# Patient Record
Sex: Female | Born: 1950 | Race: White | Hispanic: No | Smoking: Former smoker
Health system: Southern US, Community
[De-identification: ages and names within clinical notes are randomized; demographics above are authoritative.]

## PROBLEM LIST (undated history)

## (undated) DIAGNOSIS — R509 Fever, unspecified: Secondary | ICD-10-CM

## (undated) DIAGNOSIS — Z87442 Personal history of urinary calculi: Secondary | ICD-10-CM

## (undated) DIAGNOSIS — J45909 Unspecified asthma, uncomplicated: Secondary | ICD-10-CM

## (undated) DIAGNOSIS — N200 Calculus of kidney: Secondary | ICD-10-CM

## (undated) DIAGNOSIS — N189 Chronic kidney disease, unspecified: Secondary | ICD-10-CM

## (undated) DIAGNOSIS — N83299 Other ovarian cyst, unspecified side: Secondary | ICD-10-CM

## (undated) DIAGNOSIS — J189 Pneumonia, unspecified organism: Secondary | ICD-10-CM

## (undated) DIAGNOSIS — H269 Unspecified cataract: Secondary | ICD-10-CM

## (undated) DIAGNOSIS — H9319 Tinnitus, unspecified ear: Secondary | ICD-10-CM

## (undated) DIAGNOSIS — I10 Essential (primary) hypertension: Secondary | ICD-10-CM

## (undated) DIAGNOSIS — M778 Other enthesopathies, not elsewhere classified: Secondary | ICD-10-CM

## (undated) DIAGNOSIS — G4700 Insomnia, unspecified: Secondary | ICD-10-CM

## (undated) DIAGNOSIS — E669 Obesity, unspecified: Secondary | ICD-10-CM

## (undated) DIAGNOSIS — F320 Major depressive disorder, single episode, mild: Secondary | ICD-10-CM

## (undated) DIAGNOSIS — R569 Unspecified convulsions: Secondary | ICD-10-CM

## (undated) DIAGNOSIS — D649 Anemia, unspecified: Secondary | ICD-10-CM

## (undated) DIAGNOSIS — M1990 Unspecified osteoarthritis, unspecified site: Secondary | ICD-10-CM

## (undated) DIAGNOSIS — T7840XA Allergy, unspecified, initial encounter: Secondary | ICD-10-CM

## (undated) DIAGNOSIS — K219 Gastro-esophageal reflux disease without esophagitis: Secondary | ICD-10-CM

## (undated) DIAGNOSIS — E785 Hyperlipidemia, unspecified: Secondary | ICD-10-CM

## (undated) DIAGNOSIS — K8020 Calculus of gallbladder without cholecystitis without obstruction: Secondary | ICD-10-CM

## (undated) DIAGNOSIS — T4145XA Adverse effect of unspecified anesthetic, initial encounter: Secondary | ICD-10-CM

## (undated) DIAGNOSIS — J302 Other seasonal allergic rhinitis: Secondary | ICD-10-CM

## (undated) DIAGNOSIS — M2550 Pain in unspecified joint: Secondary | ICD-10-CM

## (undated) DIAGNOSIS — E119 Type 2 diabetes mellitus without complications: Secondary | ICD-10-CM

## (undated) HISTORY — PX: TONSILLECTOMY: SUR1361

## (undated) HISTORY — DX: Major depressive disorder, single episode, mild: F32.0

## (undated) HISTORY — PX: ELBOW SURGERY: SHX618

## (undated) HISTORY — DX: Obesity, unspecified: E66.9

## (undated) HISTORY — DX: Calculus of kidney: N20.0

## (undated) HISTORY — DX: Allergy, unspecified, initial encounter: T78.40XA

## (undated) HISTORY — DX: Other ovarian cyst, unspecified side: N83.299

## (undated) HISTORY — DX: Gastro-esophageal reflux disease without esophagitis: K21.9

## (undated) HISTORY — DX: Other enthesopathies, not elsewhere classified: M77.8

## (undated) HISTORY — PX: FRACTURE SURGERY: SHX138

## (undated) HISTORY — DX: Unspecified cataract: H26.9

## (undated) HISTORY — DX: Essential (primary) hypertension: I10

## (undated) HISTORY — DX: Type 2 diabetes mellitus without complications: E11.9

## (undated) HISTORY — DX: Tinnitus, unspecified ear: H93.19

## (undated) HISTORY — DX: Fever, unspecified: R50.9

## (undated) HISTORY — PX: TUBAL LIGATION: SHX77

## (undated) HISTORY — DX: Calculus of gallbladder without cholecystitis without obstruction: K80.20

---

## 1979-09-07 DIAGNOSIS — T8859XA Other complications of anesthesia, initial encounter: Secondary | ICD-10-CM

## 1979-09-07 HISTORY — DX: Other complications of anesthesia, initial encounter: T88.59XA

## 1992-09-06 HISTORY — PX: ABDOMINAL HYSTERECTOMY: SHX81

## 1992-09-06 HISTORY — PX: PARTIAL HYSTERECTOMY: SHX80

## 2002-12-27 DIAGNOSIS — Z860100 Personal history of colon polyps, unspecified: Secondary | ICD-10-CM | POA: Insufficient documentation

## 2005-12-23 ENCOUNTER — Ambulatory Visit: Payer: Self-pay | Admitting: Unknown Physician Specialty

## 2006-06-20 ENCOUNTER — Emergency Department: Payer: Self-pay | Admitting: Emergency Medicine

## 2006-12-30 ENCOUNTER — Ambulatory Visit: Payer: Self-pay

## 2007-02-28 ENCOUNTER — Ambulatory Visit: Payer: Self-pay | Admitting: Internal Medicine

## 2007-03-03 ENCOUNTER — Ambulatory Visit: Payer: Self-pay | Admitting: Internal Medicine

## 2007-03-07 ENCOUNTER — Ambulatory Visit: Payer: Self-pay | Admitting: Internal Medicine

## 2007-05-08 HISTORY — PX: COLONOSCOPY: SHX174

## 2007-05-12 ENCOUNTER — Ambulatory Visit: Payer: Self-pay | Admitting: Unknown Physician Specialty

## 2008-06-25 ENCOUNTER — Emergency Department: Payer: Self-pay | Admitting: Emergency Medicine

## 2009-09-06 DIAGNOSIS — N83299 Other ovarian cyst, unspecified side: Secondary | ICD-10-CM

## 2009-09-06 HISTORY — PX: UPPER GI ENDOSCOPY: SHX6162

## 2009-09-06 HISTORY — DX: Other ovarian cyst, unspecified side: N83.299

## 2010-03-05 ENCOUNTER — Emergency Department: Payer: Self-pay | Admitting: Emergency Medicine

## 2010-03-11 ENCOUNTER — Ambulatory Visit: Payer: Self-pay | Admitting: General Practice

## 2010-03-19 ENCOUNTER — Ambulatory Visit: Payer: Self-pay | Admitting: Urology

## 2010-03-30 ENCOUNTER — Ambulatory Visit: Payer: Self-pay | Admitting: Urology

## 2010-04-06 HISTORY — PX: OOPHORECTOMY: SHX86

## 2010-04-27 ENCOUNTER — Ambulatory Visit: Payer: Self-pay

## 2010-04-27 ENCOUNTER — Ambulatory Visit: Payer: Self-pay | Admitting: Urology

## 2010-04-30 ENCOUNTER — Ambulatory Visit: Payer: Self-pay

## 2010-05-04 LAB — PATHOLOGY REPORT

## 2010-10-27 ENCOUNTER — Ambulatory Visit: Payer: Self-pay | Admitting: Urology

## 2011-07-15 IMAGING — CR DG ABDOMEN 1V
1 series · 2 of 2 positions shown · non-contrast
Comparison: none

REASON FOR EXAM: neprolithiasis
COMMENTS:

PROCEDURE:     DXR - DXR KIDNEY URETER BLADDER  - October 27, 2010  [DATE]
RESULT:     Comparison: 03/30/2010

[Series 1: view not recorded · 0.17mm/px · 2 of 2 slices shown]
[im 1/2]
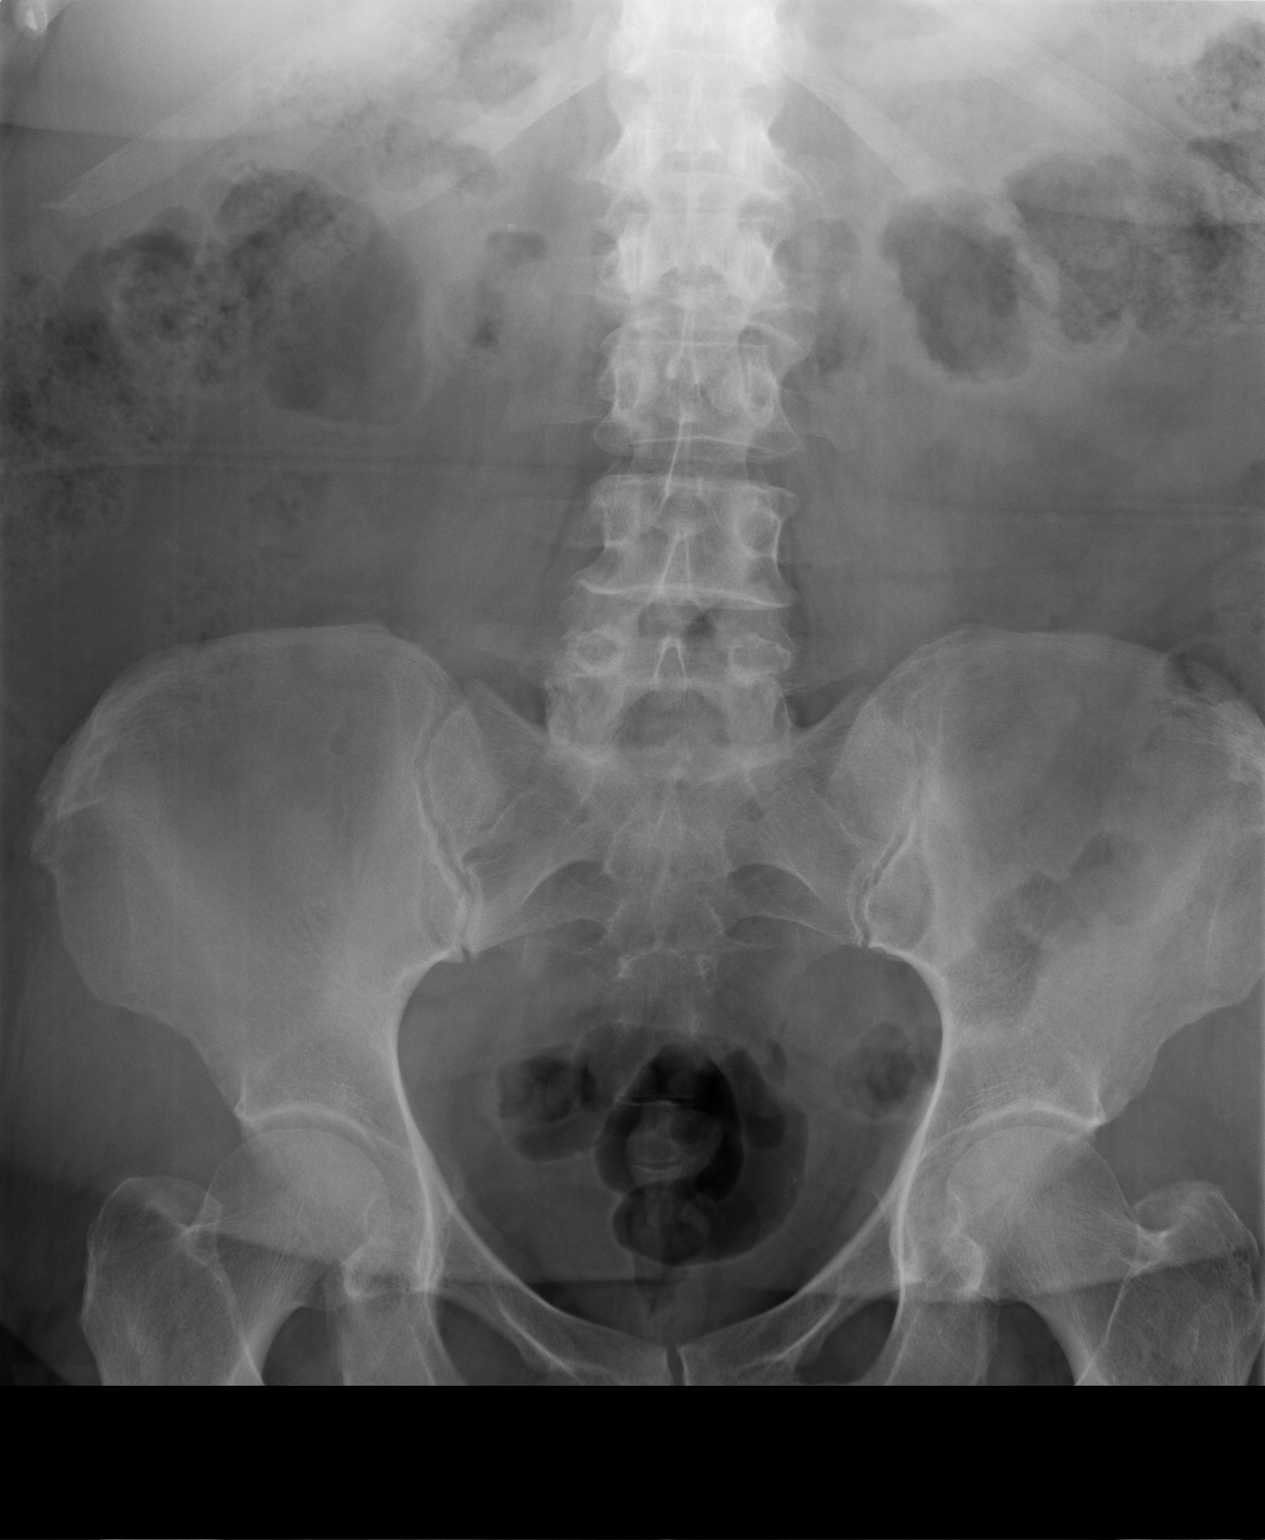
[im 2/2]
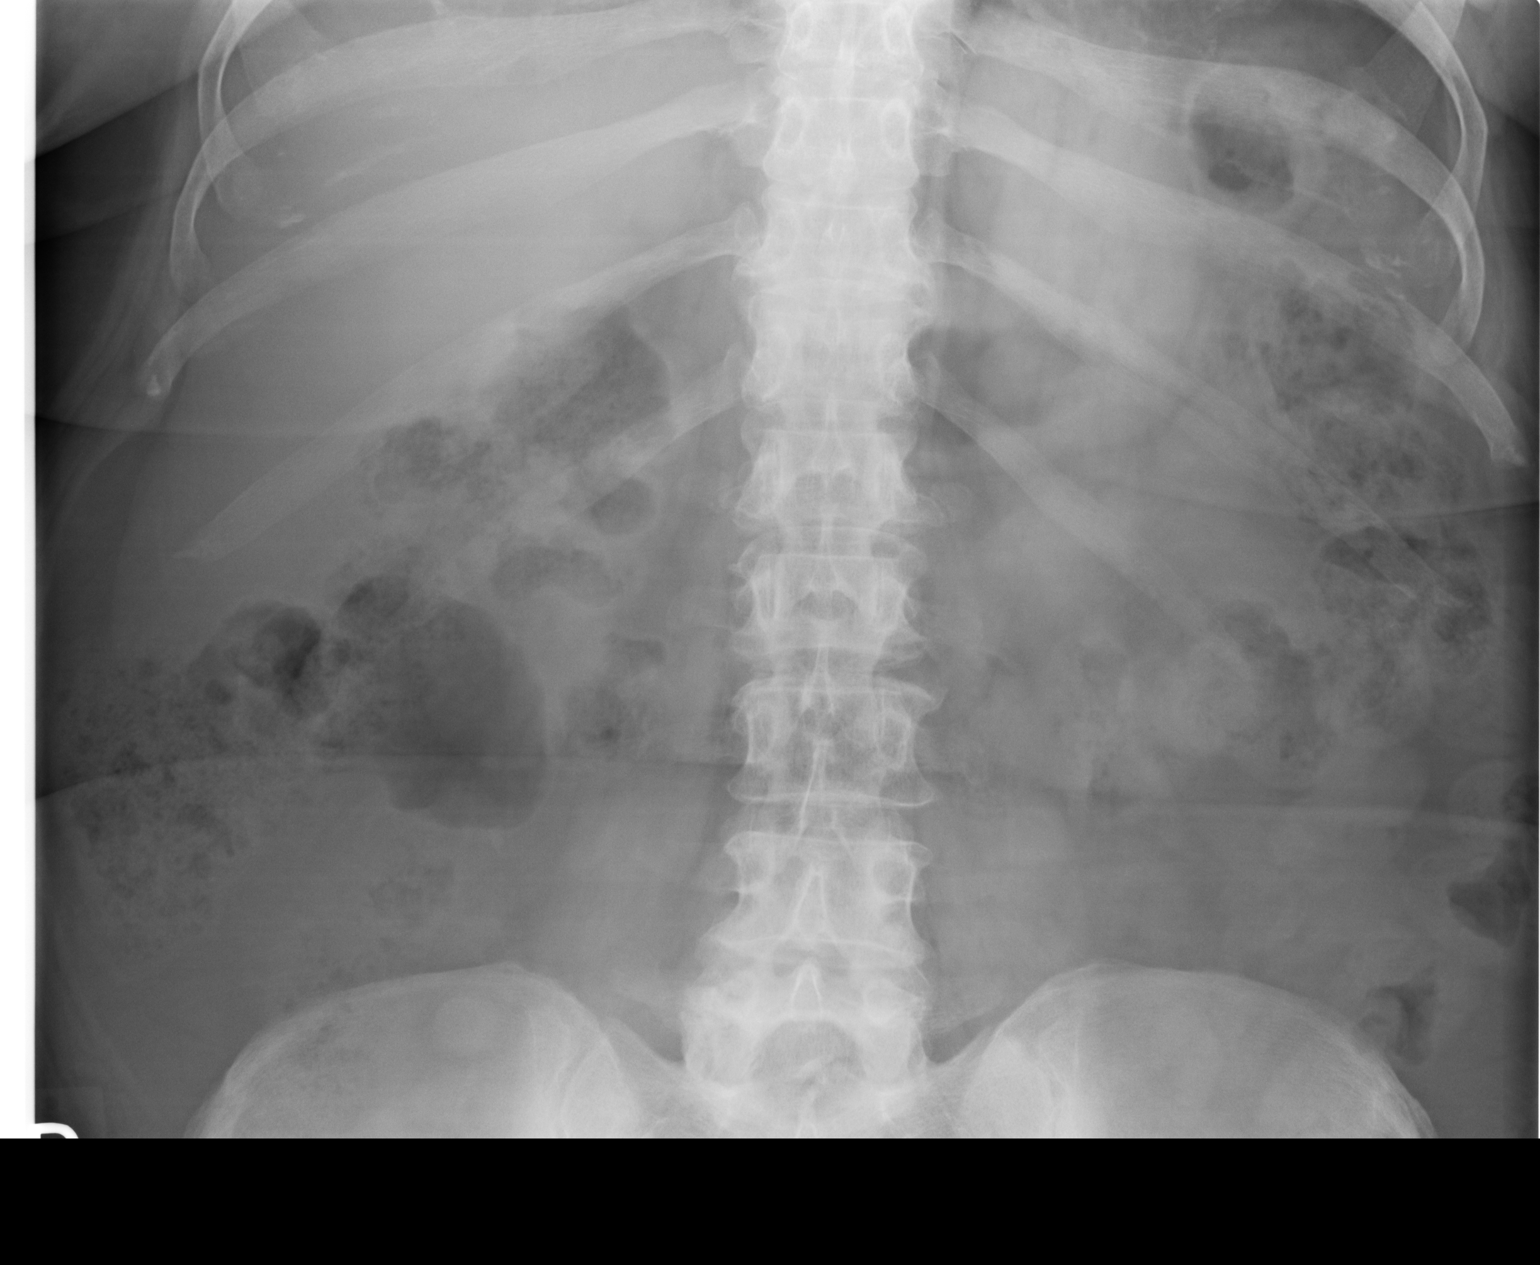

[2 of 2 positions shown; findings below may reference images not displayed]

FINDINGS: No definite radiopaque calculi seen overlying the renal shadows or expected
courses of the ureters. Small calcifications in the pelvis are similar to
prior and likely related to phleboliths. Air is seen within nondilated small
and large bowel.
IMPRESSION: No definite radiopaque calculi seen overlying the kidneys.

## 2011-09-14 ENCOUNTER — Ambulatory Visit (INDEPENDENT_AMBULATORY_CARE_PROVIDER_SITE_OTHER): Payer: BC Managed Care – PPO | Admitting: Internal Medicine

## 2011-09-14 ENCOUNTER — Encounter: Payer: Self-pay | Admitting: Internal Medicine

## 2011-09-14 VITALS — BP 144/76 | HR 96 | Temp 98.6°F | Wt 240.0 lb

## 2011-09-14 DIAGNOSIS — R7309 Other abnormal glucose: Secondary | ICD-10-CM

## 2011-09-14 DIAGNOSIS — R739 Hyperglycemia, unspecified: Secondary | ICD-10-CM

## 2011-09-14 DIAGNOSIS — E559 Vitamin D deficiency, unspecified: Secondary | ICD-10-CM

## 2011-09-14 DIAGNOSIS — R030 Elevated blood-pressure reading, without diagnosis of hypertension: Secondary | ICD-10-CM

## 2011-09-14 DIAGNOSIS — Z23 Encounter for immunization: Secondary | ICD-10-CM

## 2011-09-14 DIAGNOSIS — H6690 Otitis media, unspecified, unspecified ear: Secondary | ICD-10-CM

## 2011-09-14 MED ORDER — DOXYCYCLINE HYCLATE 100 MG PO TABS: 100.0000 mg | ORAL_TABLET | Freq: Two times a day (BID) | ORAL | Status: AC

## 2011-09-14 MED ORDER — CLINDAMYCIN HCL 300 MG PO CAPS: 300.0000 mg | ORAL_CAPSULE | Freq: Three times a day (TID) | ORAL | Status: AC

## 2011-09-14 MED ORDER — PREDNISONE (PAK) 10 MG PO TABS: ORAL_TABLET | ORAL | Status: AC

## 2011-09-14 NOTE — Patient Instructions (Signed)
We are using 2 antibiotics for your ears,  Take both for 7 days,    And take sudafed PE  For your decongestant, 10 mg every 6 hours but omit the evening dose and use afrin nasal spray at bedtime  Adding prednisone for inflammation ;  Its a 6 day taper dosed once daily

## 2011-09-14 NOTE — Progress Notes (Signed)
  Subjective:    Patient ID: Cheryl Wallace, female    DOB: [birth year], 61 y.o.   MRN: [id number]  HPI  Cheryl Wallace is a 61 yr old white female with a hsitory of obesity and depression who presents with chest congestion and sinus head cold symptoms since Christmas.  She has had a low grade fever for one week ( T 99) without myalgias, nausea or diarrhea.  Not taking anything OTC.  Works for [employer] but has not had her influenza  Vaccine yet.  Past Medical History  Diagnosis Date  . Obesity   . Depression, major   . Allergic rhinitis    No current outpatient prescriptions on file prior to visit.     Review of Systems  Constitutional: Negative for fever, chills and unexpected weight change.  HENT: Positive for congestion, rhinorrhea, postnasal drip and sinus pressure. Negative for hearing loss, ear pain, nosebleeds, sore throat, facial swelling, sneezing, mouth sores, trouble swallowing, neck pain, neck stiffness, voice change, tinnitus and ear discharge.   Eyes: Negative for pain, discharge, redness and visual disturbance.  Respiratory: Positive for cough. Negative for chest tightness, shortness of breath, wheezing and stridor.   Cardiovascular: Negative for chest pain, palpitations and leg swelling.  Musculoskeletal: Negative for myalgias and arthralgias.  Skin: Negative for color change and rash.  Neurological: Negative for dizziness, weakness, light-headedness and headaches.  Hematological: Negative for adenopathy.   BP 144/76  Pulse 96  Temp(Src) 98.6 F (37 C) (Oral)  Wt 240 lb (108.863 kg)  SpO2 97%      Objective:   Physical Exam  Constitutional: She appears well-developed and well-nourished.  HENT:  Right Ear: Tympanic membrane and ear canal normal.  Left Ear: Ear canal normal. Tympanic membrane is injected and bulging.  Mouth/Throat: Mucous membranes are normal. Posterior oropharyngeal erythema present. No posterior oropharyngeal edema.  Eyes: EOM are  normal. Pupils are equal, round, and reactive to light. Right conjunctiva is injected. Left conjunctiva is injected.  Neck: Normal range of motion. Neck supple.  Pulmonary/Chest: Effort normal and breath sounds normal.  Abdominal: Soft.  Lymphadenopathy:    She has cervical adenopathy.          Assessment & Plan:  Otitis media left ear due to persistent sinus congestion.  Augmentin. Sudafed PE and prednisone x 1 week.  Elevated blood-pressure reading without diagnosis of hypertension May be due to recent URI .  Will have patient recheck once her acute illness is resolved. t    Updated Medication List Outpatient Encounter Prescriptions as of 09/14/2011  Medication Sig Dispense Refill  . omeprazole (PRILOSEC OTC) 20 MG tablet Take 20 mg by mouth daily.        . clindamycin (CLEOCIN) 300 MG capsule Take 1 capsule (300 mg total) by mouth 3 (three) times daily.  21 capsule  0  . doxycycline (VIBRA-TABS) 100 MG tablet Take 1 tablet (100 mg total) by mouth 2 (two) times daily.  14 tablet  0  . predniSONE (STERAPRED UNI-PAK) 10 MG tablet 6 tablets on day 1, decrease by tablet daily until gone  21 tablet  0

## 2011-09-15 ENCOUNTER — Encounter: Payer: Self-pay | Admitting: Internal Medicine

## 2011-09-15 DIAGNOSIS — I152 Hypertension secondary to endocrine disorders: Secondary | ICD-10-CM | POA: Insufficient documentation

## 2011-09-15 DIAGNOSIS — E1159 Type 2 diabetes mellitus with other circulatory complications: Secondary | ICD-10-CM | POA: Insufficient documentation

## 2011-09-15 DIAGNOSIS — Z23 Encounter for immunization: Secondary | ICD-10-CM

## 2011-09-15 LAB — COMPREHENSIVE METABOLIC PANEL
AST: 23 U/L (ref 0–37)
Albumin: 3.8 g/dL (ref 3.5–5.2)
Alkaline Phosphatase: 104 U/L (ref 39–117)
Calcium: 8.7 mg/dL (ref 8.4–10.5)
Chloride: 109 mEq/L (ref 96–112)
Glucose, Bld: 109 mg/dL — ABNORMAL HIGH (ref 70–99)
Potassium: 3.7 mEq/L (ref 3.5–5.1)
Sodium: 142 mEq/L (ref 135–145)
Total Protein: 6.7 g/dL (ref 6.0–8.3)

## 2011-09-15 NOTE — Assessment & Plan Note (Signed)
May be due to recent URI .  Will have patient recheck once her acute illness is resolved. t

## 2011-12-21 ENCOUNTER — Encounter: Payer: Self-pay | Admitting: Internal Medicine

## 2011-12-21 ENCOUNTER — Ambulatory Visit (INDEPENDENT_AMBULATORY_CARE_PROVIDER_SITE_OTHER): Payer: BC Managed Care – PPO | Admitting: Internal Medicine

## 2011-12-21 VITALS — BP 150/84 | HR 92 | Temp 98.2°F | Resp 16 | Wt 229.5 lb

## 2011-12-21 DIAGNOSIS — J309 Allergic rhinitis, unspecified: Secondary | ICD-10-CM

## 2011-12-21 DIAGNOSIS — J3089 Other allergic rhinitis: Secondary | ICD-10-CM | POA: Insufficient documentation

## 2011-12-21 DIAGNOSIS — R030 Elevated blood-pressure reading, without diagnosis of hypertension: Secondary | ICD-10-CM

## 2011-12-21 DIAGNOSIS — E119 Type 2 diabetes mellitus without complications: Secondary | ICD-10-CM

## 2011-12-21 MED ORDER — CETIRIZINE HCL 10 MG PO TABS: 10.0000 mg | ORAL_TABLET | Freq: Every day | ORAL | Status: DC

## 2011-12-21 NOTE — Patient Instructions (Addendum)
You appear to be suffering from allergies.,  Take zyrtec daily  In the evening,  Add afrin at bedtime or sudafed PE during the day for the congestion   Consider the Low Glycemic Index Diet and 6 smaller meals daily :   7 AM Low carbohydrate Protein  Shakes (EAS Carb Control  Or Atkins ,  Available everywhere,   In  cases at BJs )  2.5 carbs  (Add or substitute a toasted sandwhich thin w/ peanut butter)  10 AM: Protein bar by Atkins (snack size,  Chocolate lover's variety at  [employer]J's)    Lunch: sandwich on pita bread or flatbread (Joseph's makes a pita bread and a flat bread , available at [employer] and [employer]J's; Toufayah makes a low carb flatbread available at [employer] and [employer]) Mission makes a low carb whole wheat tortilla  3 PM:  Mid day :  Another protein bar,  Or a  cheese stick, 1/4 cup of almonds, walnuts, pistachios, pecans, peanuts,  Macadamia nuts  6 PM  Dinner:  "mean and green:"  Meat/chicken/fish, salad, and green veggie : use ranch, vinagrette,  Blue cheese, etc  9 PM snack : Breyer's low carb fudgiscle or  ice cream bar (Carb Smart) Weight Watcher's ice cream bar , or another protein shake

## 2011-12-21 NOTE — Progress Notes (Signed)
Patient ID: Cheryl Wallace, female   DOB: Jul 01, 1951, 61 y.o.   MRN: [id number]  Patient Active Problem List  Diagnoses  . Elevated blood-pressure reading without diagnosis of hypertension  . Rhinitis, allergic  . Diabetes mellitus, new onset    Subjective:  CC:   Chief Complaint  Patient presents with  . Follow-up    HPI:   Cheryl Pankowski Huffmanis a 61 y.o. female who presents One week history of sinus pressure over frontal and maxillary, bilaterally, accompanied by rhinitis, sneezing and conjunctivitis, with clear discharge,  No nasal discharge.  2nd issue is follow up on abnormal labs drawn in early January.  Her hgba1c was 6.9, reflecting a new diagnosis of diabetes.  Since receiving this news she has been following a low glycemic index diet and has lost 11 lbs by limiting her starches. She was not instructed to check her blood sugars as of yet.       Past Medical History  Diagnosis Date  . Obesity   . Depression, major   . Allergic rhinitis     Past Surgical History  Procedure Date  . Partial hysterectomy 1994    still has ovaries, removed for fibroids  . Oophorectomy 04/2010    bilateral, 7 cm benign tumor left ovary         The following portions of the patient's history were reviewed and updated as appropriate: Allergies, current medications, and problem list.    Review of Systems:   12 Pt  review of systems was negative except those addressed in the HPI,     History   Social History  . Marital Status: Divorced    Spouse Name: N/A    Number of Children: N/A  . Years of Education: N/A   Occupational History  . Not on file.   Social History Main Topics  . Smoking status: Former Games developer  . Smokeless tobacco: Never Used   Comment: remote, quit 20 years ago  . Alcohol Use: 3.0 oz/week    5 Glasses of wine per week     on occasion, martinis maybe once a month  . Drug Use: Not on file  . Sexually Active: Not on file   Other Topics Concern  . Not on  file   Social History Narrative   Lives alone, exercises occasionally.  Always uses seat belts.  Has well water.    Objective:  BP 150/84  Pulse 92  Temp(Src) 98.2 F (36.8 C) (Oral)  Resp 16  Wt 229 lb 8 oz (104.101 kg)  SpO2 95%  General appearance: alert, cooperative and appears stated age Ears: normal TM's and external ear canals both ears Throat: lips, mucosa, and tongue normal; teeth and gums normal Neck: no adenopathy, no carotid bruit, supple, symmetrical, trachea midline and thyroid not enlarged, symmetric, no tenderness/mass/nodules Back: symmetric, no curvature. ROM normal. No CVA tenderness. Lungs: clear to auscultation bilaterally Heart: regular rate and rhythm, S1, S2 normal, no murmur, click, rub or gallop Abdomen: soft, non-tender; bowel sounds normal; no masses,  no organomegaly Pulses: 2+ and symmetric Skin: Skin color, texture, turgor normal. No rashes or lesions Lymph nodes: Cervical, supraclavicular, and axillary nodes normal.  Assessment and Plan:  Elevated blood-pressure reading without diagnosis of hypertension This is her second elevated reading.  She is suffering from allergic rhinitis currently and has taken a decongestant.  She will return for bp check in one month. Will start losartan or lisinopril if 3rd reading confirms hypertension .  Diabetes mellitus,  new onset Diagnosed by hgba1c of 6.9.  She has already lost 11 lbs on a low glycemic index diet and repeat A1c is 6.8 .  Handouts and educational discussion given today.  Annul eye exam recommended.  foot exam was normal. Return for HgbA1c in 3 months   Rhinitis, allergic Supportive care recommended.  Advised to avoid decongestants (oral)    Updated Medication List Outpatient Encounter Prescriptions as of 12/21/2011  Medication Sig Dispense Refill  . amitriptyline (ELAVIL) 25 MG tablet Take 25 mg by mouth at bedtime.      [name] omeprazole (PRILOSEC OTC) 20 MG tablet Take 20 mg by mouth daily.         . cetirizine (ZYRTEC) 10 MG tablet Take 1 tablet (10 mg total) by mouth daily.  30 tablet  11     Orders Placed This Encounter  Procedures  . Hemoglobin A1c  . Basic metabolic panel  . Direct LDL    Return in about 3 months (around 03/21/2012).

## 2011-12-22 LAB — LDL CHOLESTEROL, DIRECT: Direct LDL: 83.2 mg/dL

## 2011-12-22 LAB — BASIC METABOLIC PANEL
BUN: 14 mg/dL (ref 6–23)
Chloride: 106 mEq/L (ref 96–112)
GFR: 49.8 mL/min — ABNORMAL LOW (ref >60.00)
Potassium: 3.6 mEq/L (ref 3.5–5.1)
Sodium: 140 mEq/L (ref 135–145)

## 2011-12-23 ENCOUNTER — Encounter: Payer: Self-pay | Admitting: Internal Medicine

## 2011-12-23 DIAGNOSIS — R7301 Impaired fasting glucose: Secondary | ICD-10-CM | POA: Insufficient documentation

## 2011-12-23 NOTE — Assessment & Plan Note (Signed)
Supportive care recommended.  Advised to avoid decongestants (oral)

## 2011-12-23 NOTE — Assessment & Plan Note (Addendum)
Diagnosed by hgba1c of 6.9.  She has already lost 11 lbs on a low glycemic index diet and repeat A1c is 6.8 .  Handouts and educational discussion given today.  Annul eye exam recommended.  foot exam was normal. Return for HgbA1c in 3 months

## 2011-12-23 NOTE — Assessment & Plan Note (Signed)
This is her second elevated reading.  She is suffering from allergic rhinitis currently and has taken a decongestant.  She will return for bp check in one month. Will start losartan or lisinopril if 3rd reading confirms hypertension .

## 2011-12-24 ENCOUNTER — Other Ambulatory Visit: Payer: Self-pay | Admitting: Internal Medicine

## 2011-12-24 ENCOUNTER — Telehealth: Payer: Self-pay | Admitting: *Deleted

## 2011-12-24 MED ORDER — OMEPRAZOLE MAGNESIUM 20 MG PO TBEC: 20.0000 mg | DELAYED_RELEASE_TABLET | Freq: Two times a day (BID) | ORAL | Status: DC

## 2011-12-24 NOTE — Telephone Encounter (Signed)
Patient was notified of lab results.

## 2012-03-29 ENCOUNTER — Other Ambulatory Visit: Payer: BC Managed Care – PPO

## 2012-04-03 ENCOUNTER — Ambulatory Visit: Payer: BC Managed Care – PPO | Admitting: Internal Medicine

## 2012-04-26 ENCOUNTER — Other Ambulatory Visit (INDEPENDENT_AMBULATORY_CARE_PROVIDER_SITE_OTHER): Payer: BC Managed Care – PPO | Admitting: *Deleted

## 2012-04-26 DIAGNOSIS — E119 Type 2 diabetes mellitus without complications: Secondary | ICD-10-CM

## 2012-04-26 LAB — LIPID PANEL
Cholesterol: 149 mg/dL (ref 0–200)
HDL: 40.3 mg/dL (ref >39.00)
Triglycerides: 136 mg/dL (ref 0.0–149.0)
VLDL: 27.2 mg/dL (ref 0.0–40.0)

## 2012-04-26 LAB — MICROALBUMIN / CREATININE URINE RATIO: Creatinine,U: 82.3 mg/dL

## 2012-04-27 LAB — COMPLETE METABOLIC PANEL WITH GFR
CO2: 27 mEq/L (ref 19–32)
Creat: 0.83 mg/dL (ref 0.50–1.10)
GFR, Est African American: 89 mL/min
GFR, Est Non African American: 77 mL/min
Glucose, Bld: 124 mg/dL — ABNORMAL HIGH (ref 70–99)
Sodium: 138 mEq/L (ref 135–145)
Total Bilirubin: 0.6 mg/dL (ref 0.3–1.2)
Total Protein: 6.1 g/dL (ref 6.0–8.3)

## 2012-05-03 ENCOUNTER — Ambulatory Visit: Payer: BC Managed Care – PPO | Admitting: Internal Medicine

## 2012-05-04 ENCOUNTER — Encounter: Payer: Self-pay | Admitting: Internal Medicine

## 2012-05-17 ENCOUNTER — Encounter: Payer: Self-pay | Admitting: Internal Medicine

## 2012-05-17 ENCOUNTER — Ambulatory Visit (INDEPENDENT_AMBULATORY_CARE_PROVIDER_SITE_OTHER): Payer: BC Managed Care – PPO | Admitting: Internal Medicine

## 2012-05-17 VITALS — BP 132/74 | HR 80 | Temp 98.5°F | Resp 16 | Wt 241.2 lb

## 2012-05-17 DIAGNOSIS — E119 Type 2 diabetes mellitus without complications: Secondary | ICD-10-CM

## 2012-05-17 DIAGNOSIS — J069 Acute upper respiratory infection, unspecified: Secondary | ICD-10-CM

## 2012-05-17 DIAGNOSIS — R030 Elevated blood-pressure reading, without diagnosis of hypertension: Secondary | ICD-10-CM

## 2012-05-17 NOTE — Patient Instructions (Addendum)
You have diet controlled diabetes  (for now)  .  You need to get back on the low glycemic index diet to prevent the need for medications.   Your sinus and ear symptoms may be viral,  Or allergic.  Use sudafed PE 10 mg every 6 hours,   Allegra or zyrtec once daily for the sneezing watery eyes,  Gargle with salt water for scratchy throat.  Simply Saline sinus lavage twice daily to flush sinuses.  If no improvement in sinus drainage and ear pain in 4 days,  Call for an antibiotic.    This is  Dr. Melina Schools version of a  "Low GI"  Diet:  All of the foods can be found at grocery stores and in bulk at [employer].  The Atkins protein bars and shakes are available in more varieties at Target, [employer] and [employer].     7 AM Breakfast:  Low carbohydrate Protein  Shakes (I recommend the EAS AdvantEdge "Carb Control" shakes  Or the low carb shakes by Atkins.   Both are available everywhere:  In  cases at BJs  Or in 4 packs at grocery stores and pharmacies  2.5 carbs  (Alternative is  a toasted Arnold's Sandwhich Thin w/ peanut butter, a "Bagel Thin" with cream cheese and salmon) or  a scrambled egg burrito made with a low carb tortilla .  Avoid cereal and bananas, oatmeal too unless you are cooking the old fashioned kind that takes 30-40 minutes to prepare.  the rest is overly processed, has minimal fiber, and is loaded with carbohydrates!   10 AM: Protein bar by Atkins (the snack size, under 200 cal).  There are many varieties , available widely again or in bulk in limited varieties at BJs)  Other so called "protein bars" tend to be loaded with carbohydrates.  Remember, in food advertising, the word "energy" is synonymous for " carbohydrate."  Lunch: sandwich of [place], (or any lunchmeat, grilled meat or canned tuna), fresh avocado, mayonnaise  and cheese on a lower carbohydrate pita bread, flatbread, or tortilla . Ok to use regular mayonnaise. The bread is the only source or carbohydrate that can be decreased  (Joseph's makes a pita bread and a flat bread that are 50 cal and 4 net carbs ; Toufayan makes a low carb flatbread that's 100 cal and 9 net carbs  and  Mission makes a low carb whole wheat tortilla  That is 210 cal and 6 net carbs)  3 PM:  Mid day :  Another protein bar,  Or a  cheese stick (100 cal, 0 carbs),  Or 1 ounce of  almonds, walnuts, pistachios, pecans, peanuts,  Macadamia nuts. Or a Dannon light n Fit greek yogurt, 80 cal 8 net carbs . Avoid "granola"; the dried cranberries and raisins are loaded with carbohydrates. Mixed nuts ok if no raisins or cranberries or dried fruit.      6 PM  Dinner:  "mean and green:"  Meat/chicken/fish or a high protein legume; , with a green salad, and a low GI  Veggie (broccoli, cauliflower, green beans, spinach, brussel sprouts. Lima beans) : Avoid "Low fat dressings, as well as Reyne Dumas and [address]! They are loaded with sugar! Instead use ranch, vinagrette,  Blue cheese, etc  9 PM snack : Breyer's "low carb" fudgsicle or  ice cream bar (Carb Smart line), or  Weight Watcher's ice cream bar , or another "no sugar added" ice cream;a serving of fresh berries/cherries with whipped  cream (Avoid bananas, pineapple, grapes  and watermelon on a regular basis because they are high in sugar)   Remember that snack Substitutions should be less than 15 to 20 carbs  Per serving. Remember to subtract fiber grams and sugar alcohols to get the "net carbs."

## 2012-05-17 NOTE — Progress Notes (Signed)
Patient ID: Cheryl Wallace, female   DOB: 07-06-1951, 61 y.o.   MRN: [id number]  Patient Active Problem List  Diagnosis  . Elevated blood-pressure reading without diagnosis of hypertension  . Rhinitis, allergic  . Diabetes mellitus, new onset  . URI, acute    Subjective:  CC:   Chief Complaint  Patient presents with  . Follow-up    3 month    HPI:   Cheryl Gores Huffmanis a 61 y.o. female who presents for Follow up on diet controlled diabetes, recently diagnosed with hemoglobin A1c.  She has not been  following a  low gi diet  and has  Gained all of the  weight she will previously lost. She has been eating more due to stress of recent job relocation. She reports a  Headache for one week that started with right ear pain. She has not been blowing out purulent drainage from his sinuses. She denies fevers but has been having a scratchy throat   Past Medical History  Diagnosis Date  . Obesity   . Depression, major   . Allergic rhinitis     Past Surgical History  Procedure Date  . Partial hysterectomy 1994    still has ovaries, removed for fibroids  . Oophorectomy 04/2010    bilateral, 7 cm benign tumor left ovary         The following portions of the patient's history were reviewed and updated as appropriate: Allergies, current medications, and problem list.    Review of Systems:   12 Pt  review of systems was negative except those addressed in the HPI,     History   Social History  . Marital Status: Divorced    Spouse Name: N/A    Number of Children: N/A  . Years of Education: N/A   Occupational History  . Not on file.   Social History Main Topics  . Smoking status: Former Games developer  . Smokeless tobacco: Never Used   Comment: remote, quit 20 years ago  . Alcohol Use: 3.0 oz/week    5 Glasses of wine per week     on occasion, martinis maybe once a month  . Drug Use: Not on file  . Sexually Active: Not on file   Other Topics Concern  . Not on file   Social  History Narrative   Lives alone, exercises occasionally.  Always uses seat belts.  Has well water.    Objective:  BP 132/74  Pulse 80  Temp 98.5 F (36.9 C) (Oral)  Resp 16  Wt 241 lb 4 oz (109.43 kg)  SpO2 97%  General appearance: alert, cooperative and appears stated age Ears: normal TM's and external ear canals both ears Throat: lips, mucosa, and tongue normal; teeth and gums normal Neck: no adenopathy, no carotid bruit, supple, symmetrical, trachea midline and thyroid not enlarged, symmetric, no tenderness/mass/nodules Back: symmetric, no curvature. ROM normal. No CVA tenderness. Lungs: clear to auscultation bilaterally Heart: regular rate and rhythm, S1, S2 normal, no murmur, click, rub or gallop Abdomen: soft, non-tender; bowel sounds normal; no masses,  no organomegaly Pulses: 2+ and symmetric Skin: Skin color, texture, turgor normal. No rashes or lesions Lymph nodes: Cervical, supraclavicular, and axillary nodes normal. Foot exam:  Nails are well trimmed,  No callouses,  Sensation intact to microfilament  Assessment and Plan:  URI, acute Multiple sites affected. Nothing to suggest suspect bacterial infection requiring antibiotics. Supportive care recommended.  Diabetes mellitus, new onset Reemphasized the importance of a low glycemic  index diet and regular followup for this new diagnosis for her with hemoglobin A1c of 6.8 made in August.  Low glycemic index diet given to her is present. She is not due for labs. Reminder for annual diabetic eye exam given. Foot exam today was normal.  Elevated blood-pressure reading without diagnosis of hypertension Blood pressure today was normal. Prior elevations were probably due to over-the-counter medications used.   Updated Medication List Outpatient Encounter Prescriptions as of 05/17/2012  Medication Sig Dispense Refill  . cholecalciferol (VITAMIN D) 1000 UNITS tablet Take 1,000 Units by mouth daily.      [name] omeprazole  (PRILOSEC OTC) 20 MG tablet Take 1 tablet (20 mg total) by mouth 2 (two) times daily.  60 tablet  6  . DISCONTD: amitriptyline (ELAVIL) 25 MG tablet Take 25 mg by mouth at bedtime.      [name] DISCONTD: cetirizine (ZYRTEC) 10 MG tablet Take 1 tablet (10 mg total) by mouth daily.  30 tablet  11     Orders Placed This Encounter  Procedures  . HM DIABETES EYE EXAM    Return in about 3 months (around 08/16/2012).

## 2012-05-19 ENCOUNTER — Encounter: Payer: Self-pay | Admitting: Internal Medicine

## 2012-05-19 DIAGNOSIS — J069 Acute upper respiratory infection, unspecified: Secondary | ICD-10-CM | POA: Insufficient documentation

## 2012-05-19 NOTE — Assessment & Plan Note (Signed)
Multiple sites affected. Nothing to suggest suspect bacterial infection requiring antibiotics. Supportive care recommended.

## 2012-05-19 NOTE — Assessment & Plan Note (Signed)
Blood pressure today was normal. Prior elevations were probably due to over-the-counter medications used.

## 2012-05-19 NOTE — Assessment & Plan Note (Addendum)
Reemphasized the importance of a low glycemic index diet and regular followup for this new diagnosis for her with hemoglobin A1c of 6.8 made in August.  Low glycemic index diet given to her is present. She is not due for labs. Reminder for annual diabetic eye exam given. Foot exam today was normal.

## 2012-06-21 ENCOUNTER — Other Ambulatory Visit: Payer: Self-pay

## 2012-06-21 MED ORDER — OMEPRAZOLE MAGNESIUM 20 MG PO TBEC: 20.0000 mg | DELAYED_RELEASE_TABLET | Freq: Two times a day (BID) | ORAL | Status: DC

## 2012-06-21 NOTE — Telephone Encounter (Signed)
Refill request for Omeprazole 20 mg # 60 6 R. Sent electronic to [facility].

## 2012-08-22 ENCOUNTER — Ambulatory Visit: Payer: BC Managed Care – PPO | Admitting: Internal Medicine

## 2012-09-25 ENCOUNTER — Ambulatory Visit (INDEPENDENT_AMBULATORY_CARE_PROVIDER_SITE_OTHER): Payer: BC Managed Care – PPO | Admitting: Internal Medicine

## 2012-09-25 ENCOUNTER — Encounter: Payer: Self-pay | Admitting: Internal Medicine

## 2012-09-25 VITALS — BP 140/82 | HR 81 | Temp 98.2°F | Resp 16 | Wt 238.5 lb

## 2012-09-25 DIAGNOSIS — R350 Frequency of micturition: Secondary | ICD-10-CM

## 2012-09-25 DIAGNOSIS — R52 Pain, unspecified: Secondary | ICD-10-CM

## 2012-09-25 DIAGNOSIS — R109 Unspecified abdominal pain: Secondary | ICD-10-CM

## 2012-09-25 LAB — POCT URINALYSIS DIPSTICK
Ketones, UA: NEGATIVE
Protein, UA: NEGATIVE
Spec Grav, UA: 1.02

## 2012-09-25 MED ORDER — ONDANSETRON HCL 4 MG PO TABS: 4.0000 mg | ORAL_TABLET | Freq: Three times a day (TID) | ORAL | Status: DC | PRN

## 2012-09-25 MED ORDER — HYDROCODONE-ACETAMINOPHEN 5-300 MG PO TABS: 1.0000 | ORAL_TABLET | ORAL | Status: DC | PRN

## 2012-09-25 NOTE — Patient Instructions (Addendum)
Use the strainer to catch any stones   use the zofran and vicodin for symptom control  If the pain is persistent call to schedule a CT   Lots of fluids

## 2012-09-25 NOTE — Assessment & Plan Note (Signed)
With nausea vomiting  Now currently resolved with a history of renal calculi, will treat emprically

## 2012-09-25 NOTE — Assessment & Plan Note (Signed)
Suspect early stone formation vs diverticulitis. vicodin, zofran, strain urine.

## 2012-09-25 NOTE — Progress Notes (Signed)
Patient ID: Cheryl Wallace, female   DOB: 03-22-1951, 62 y.o.   MRN: [id number]  Patient Active Problem List  Diagnosis  . Elevated blood-pressure reading without diagnosis of hypertension  . Rhinitis, allergic  . Diabetes mellitus, new onset  . URI, acute  . Acute left flank pain    Subjective:  CC:   Chief Complaint  Patient presents with  . Follow-up    HPI:   Cheryl Vallas Huffmanis a 62 y.o. female who presents Left CVA and llq pain , ua negative.  Started last night with back pain and nausea/vomiting. May have had on episode of gross hematuria. Had a normal BM last night.  No boday aches and fevers.    Had a flu shot  . Has had a kidney stone on same side 3 years ago.  Danielle Dess comes ang goes,  Currently not hurting none since 5 am    Past Medical History  Diagnosis Date  . Obesity   . Depression, major   . Allergic rhinitis     Past Surgical History  Procedure Date  . Partial hysterectomy 1994    still has ovaries, removed for fibroids  . Oophorectomy 04/2010    bilateral, 7 cm benign tumor left ovary         The following portions of the patient's history were reviewed and updated as appropriate: Allergies, current medications, and problem list.    Review of Systems:   12 Pt  review of systems was negative except those addressed in the HPI,     History   Social History  . Marital Status: Divorced    Spouse Name: N/A    Number of Children: N/A  . Years of Education: N/A   Occupational History  . Not on file.   Social History Main Topics  . Smoking status: Former Games developer  . Smokeless tobacco: Never Used     Comment: remote, quit 20 years ago  . Alcohol Use: 3.0 oz/week    5 Glasses of wine per week     Comment: on occasion, martinis maybe once a month  . Drug Use: Not on file  . Sexually Active: Not on file   Other Topics Concern  . Not on file   Social History Narrative   Lives alone, exercises occasionally.  Always uses seat belts.  Has well  water.    Objective:  BP 140/82  Pulse 81  Temp 98.2 F (36.8 C) (Oral)  Resp 16  Wt 238 lb 8 oz (108.183 kg)  SpO2 97%  General appearance: alert, cooperative and appears stated age Ears: normal TM's and external ear canals both ears Throat: lips, mucosa, and tongue normal; teeth and gums normal Neck: no adenopathy, no carotid bruit, supple, symmetrical, trachea midline and thyroid not enlarged, symmetric, no tenderness/mass/nodules Back: symmetric, no curvature. ROM normal. No CVA tenderness. Lungs: clear to auscultation bilaterally Heart: regular rate and rhythm, S1, S2 normal, no murmur, click, rub or gallop Abdomen: soft, non-tender; bowel sounds normal; no masses,  no organomegaly Pulses: 2+ and symmetric Skin: Skin color, texture, turgor normal. No rashes or lesions Lymph nodes: Cervical, supraclavicular, and axillary nodes normal.  Assessment and Plan:  Acute left flank pain With nausea vomiting  Now currently resolved with a history of renal calculi, will treat emprically    Suspect early stone formation vs diverticulitis. vicodin, zofran, strain urine.    Updated Medication List Outpatient Encounter Prescriptions as of 09/25/2012  Medication Sig Dispense Refill  . cholecalciferol (  VITAMIN D) 1000 UNITS tablet Take 1,000 Units by mouth daily.      [name] omeprazole (PRILOSEC OTC) 20 MG tablet Take 1 tablet (20 mg total) by mouth 2 (two) times daily.  60 tablet  6  . Hydrocodone-Acetaminophen 5-300 MG TABS Take 1 tablet by mouth every 4 (four) hours as needed.  60 each  0  . ondansetron (ZOFRAN) 4 MG tablet Take 1 tablet (4 mg total) by mouth every 8 (eight) hours as needed for nausea.  20 tablet  0     Orders Placed This Encounter  Procedures  . POCT urinalysis dipstick    Return in about 3 months (around 12/24/2012).

## 2012-12-29 ENCOUNTER — Encounter: Payer: Self-pay | Admitting: Internal Medicine

## 2012-12-29 ENCOUNTER — Ambulatory Visit (INDEPENDENT_AMBULATORY_CARE_PROVIDER_SITE_OTHER)
Admission: RE | Admit: 2012-12-29 | Discharge: 2012-12-29 | Disposition: A | Discharge status: Home / Self Care | Payer: BC Managed Care – PPO | Source: Ambulatory Visit | Attending: Internal Medicine | Admitting: Internal Medicine

## 2012-12-29 ENCOUNTER — Ambulatory Visit (INDEPENDENT_AMBULATORY_CARE_PROVIDER_SITE_OTHER): Payer: BC Managed Care – PPO | Admitting: Internal Medicine

## 2012-12-29 VITALS — BP 159/82 | HR 90 | Temp 98.6°F | Resp 16 | Ht 66.0 in | Wt 219.5 lb

## 2012-12-29 DIAGNOSIS — M2550 Pain in unspecified joint: Secondary | ICD-10-CM

## 2012-12-29 DIAGNOSIS — E119 Type 2 diabetes mellitus without complications: Secondary | ICD-10-CM

## 2012-12-29 DIAGNOSIS — R5381 Other malaise: Secondary | ICD-10-CM

## 2012-12-29 DIAGNOSIS — Z23 Encounter for immunization: Secondary | ICD-10-CM

## 2012-12-29 DIAGNOSIS — Z1239 Encounter for other screening for malignant neoplasm of breast: Secondary | ICD-10-CM

## 2012-12-29 DIAGNOSIS — E785 Hyperlipidemia, unspecified: Secondary | ICD-10-CM

## 2012-12-29 DIAGNOSIS — Z0000 Encounter for general adult medical examination without abnormal findings: Secondary | ICD-10-CM

## 2012-12-29 DIAGNOSIS — Z800 Family history of malignant neoplasm of digestive organs: Secondary | ICD-10-CM

## 2012-12-29 DIAGNOSIS — M25519 Pain in unspecified shoulder: Secondary | ICD-10-CM

## 2012-12-29 DIAGNOSIS — R030 Elevated blood-pressure reading, without diagnosis of hypertension: Secondary | ICD-10-CM

## 2012-12-29 DIAGNOSIS — E669 Obesity, unspecified: Secondary | ICD-10-CM

## 2012-12-29 DIAGNOSIS — D819 Combined immunodeficiency, unspecified: Secondary | ICD-10-CM

## 2012-12-29 LAB — LIPID PANEL
Cholesterol: 159 mg/dL (ref 0–200)
LDL Cholesterol: 97 mg/dL (ref 0–99)

## 2012-12-29 LAB — IRON AND TIBC
%SAT: 12 % — ABNORMAL LOW (ref 20–55)
Iron: 51 ug/dL (ref 42–145)

## 2012-12-29 LAB — COMPREHENSIVE METABOLIC PANEL
Albumin: 3.8 g/dL (ref 3.5–5.2)
Alkaline Phosphatase: 95 U/L (ref 39–117)
BUN: 21 mg/dL (ref 6–23)
CO2: 28 mEq/L (ref 19–32)
Calcium: 8.9 mg/dL (ref 8.4–10.5)
Chloride: 102 mEq/L (ref 96–112)
Glucose, Bld: 120 mg/dL — ABNORMAL HIGH (ref 70–99)
Potassium: 3.9 mEq/L (ref 3.5–5.1)
Sodium: 137 mEq/L (ref 135–145)
Total Protein: 6.8 g/dL (ref 6.0–8.3)

## 2012-12-29 LAB — CBC WITH DIFFERENTIAL/PLATELET
Eosinophils Absolute: 0.2 10*3/uL (ref 0.0–0.7)
MCHC: 32.7 g/dL (ref 30.0–36.0)
MCV: 78.3 fl (ref 78.0–100.0)
Monocytes Absolute: 0.5 10*3/uL (ref 0.1–1.0)
Neutrophils Relative %: 57.3 % (ref 43.0–77.0)
Platelets: 249 10*3/uL (ref 150.0–400.0)

## 2012-12-29 LAB — TSH: TSH: 3.74 u[IU]/mL (ref 0.35–5.50)

## 2012-12-29 LAB — FERRITIN: Ferritin: 13.4 ng/mL (ref 10.0–291.0)

## 2012-12-29 MED ORDER — HYDROCODONE-ACETAMINOPHEN 10-325 MG PO TABS: 1.0000 | ORAL_TABLET | Freq: Three times a day (TID) | ORAL | Status: DC | PRN

## 2012-12-29 MED ORDER — DICLOFENAC SODIUM 75 MG PO TBEC: 75.0000 mg | DELAYED_RELEASE_TABLET | Freq: Two times a day (BID) | ORAL | Status: DC

## 2012-12-29 NOTE — Patient Instructions (Addendum)
Please go to [facility] office for your right shoulder x rays today  Please try diclofenac,  An antiinflammatory ,  And vicodin (stronger dose) for your pain   Return in 3 weeks if no better

## 2012-12-29 NOTE — Progress Notes (Signed)
Patient ID: ARITZEL Cheryl Wallace, female   DOB: 11/15/1950, 62 y.o.   MRN: [id number]   Subjective:     CORNISHA Wallace is a 62 y.o. female and is here for a comprehensive physical exam. The patient reports  bilateral shoulder and thumb pain ,  Hurting for months. Preceded yard work  In March .  Taking tylenol prn helps  A little bit.  Has not used NSAIDs ,  Doesn't have  Any at home.  No prior evaluation.  The pain is constant Thumbs are having shooting pains,  right greater than left.  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  History   Social History  . Marital Status: Divorced    Spouse Name: N/A    Number of Children: N/A  . Years of Education: N/A   Occupational History  . Not on file.   Social History Main Topics  . Smoking status: Former Games developer  . Smokeless tobacco: Never Used     Comment: remote, quit 20 years ago  . Alcohol Use: 3.0 oz/week    5 Glasses of wine per week     Comment: on occasion, martinis maybe once a month  . Drug Use: Not on file  . Sexually Active: Not on file   Other Topics Concern  . Not on file   Social History Narrative   Lives alone, exercises occasionally.  Always uses seat belts.  Has well water.   Health Maintenance  Topic Date Due  . Pap Smear  10/28/1970  . Zostavax  10/28/2012  . Mammogram  03/06/2013  . Influenza Vaccine  05/07/2013  . Colonoscopy  09/06/2018  . Tetanus/tdap  12/30/2022    The following portions of the patient's history were reviewed and updated as appropriate: allergies, current medications, past family history, past medical history, past social history, past surgical history and problem list.  Review of Systems A comprehensive review of systems was negative.   Objective:   BP 159/82  Pulse 90  Temp(Src) 98.6 F (37 C) (Oral)  Resp 16  Ht 5\' 6"  (1.676 m)  Wt 219 lb 8 oz (99.565 kg)  BMI 35.45 kg/m2  SpO2 98%  General Appearance:    Alert, cooperative, no  distress, appears stated age  Head:    Normocephalic, without obvious abnormality, atraumatic  Eyes:    PERRL, conjunctiva/corneas clear, EOM's intact, fundi    benign, both eyes  Ears:    Normal TM's and external ear canals, both ears  Nose:   Nares normal, septum midline, mucosa normal, no drainage    or sinus tenderness  Throat:   Lips, mucosa, and tongue normal; teeth and gums normal  Neck:   Supple, symmetrical, trachea midline, no adenopathy;    thyroid:  no enlargement/tenderness/nodules; no carotid   bruit or JVD  Back:     Symmetric, no curvature, ROM normal, no CVA tenderness  Lungs:     Clear to auscultation bilaterally, respirations unlabored  Chest Wall:    No tenderness or deformity   Heart:    Regular rate and rhythm, S1 and S2 normal, no murmur, rub   or gallop  Breast Exam:    No tenderness, masses, or nipple abnormality  Abdomen:     Soft, non-tender, bowel sounds active all four quadrants,    no masses, no organomegaly  Genitalia:    Pelvic: cervix absent , external genitalia normal, no adnexal masses or tenderness,  rectovaginal septum normal,  vagina normal without discharge  Extremities:   Extremities normal, atraumatic, no cyanosis or edema  Pulses:   2+ and symmetric all extremities  Skin:   Skin color, texture, turgor normal, no rashes or lesions  Lymph nodes:   Cervical, supraclavicular, and axillary nodes normal  Neurologic:   CNII-XII intact, normal strength, sensation and reflexes    throughout    .    Assessment:    Routine general medical examination at a health care facility Annual comprehensive exam was done including breast and pelvic exam. She is s/p hysterectomy and has no cervix . All screenings have been addressed .   Impaired fasting glucose Diagnosis suggested by hgba1c of 6.8, but her fasting glucoses have never been diagnostic .  Low GI diet and exercise  Recommended,  She is losing weight intentionally.   Elevated blood-pressure reading  without diagnosis of hypertension She has no evidence of proteniuria on prior evaluation.  If elevated at next  visit will start ACE inhibitor.   Family history of colon cancer Patient's last colonoscopy was 2010 and normal.   Polyarthralgia Screening for PMR, SLE, RA, gout, HH  Was done and thus far negative with ANA pending.  Plain films of right shoulder show degenerati ve changes to the [facility] joint .  She may need referral to Rheumatology for articular injection of thumbs but prefers to have a trial of NSAIDs .  Obesity, unspecified She has lost 22 lbs intentionally since September .  Fasting glucoses remain elevated but not diagnostic of DM. although her a1c was 6.8 last visit.    Updated Medication List Outpatient Encounter Prescriptions as of 12/29/2012  Medication Sig Dispense Refill  . cholecalciferol (VITAMIN D) 1000 UNITS tablet Take 1,000 Units by mouth daily.      [name] omeprazole (PRILOSEC OTC) 20 MG tablet Take 1 tablet (20 mg total) by mouth 2 (two) times daily.  60 tablet  6  . [DISCONTINUED] Hydrocodone-Acetaminophen 5-300 MG TABS Take 1 tablet by mouth every 4 (four) hours as needed.  60 each  0  . diclofenac (VOLTAREN) 75 MG EC tablet Take 1 tablet (75 mg total) by mouth 2 (two) times daily.  60 tablet  3  . HYDROcodone-acetaminophen (NORCO) 10-325 MG per tablet Take 1 tablet by mouth every 8 (eight) hours as needed for pain.  30 tablet  0  . ondansetron (ZOFRAN) 4 MG tablet Take 1 tablet (4 mg total) by mouth every 8 (eight) hours as needed for nausea.  20 tablet  0   No facility-administered encounter medications on file as of 12/29/2012.

## 2012-12-30 ENCOUNTER — Encounter: Payer: Self-pay | Admitting: Internal Medicine

## 2012-12-30 DIAGNOSIS — M2550 Pain in unspecified joint: Secondary | ICD-10-CM | POA: Insufficient documentation

## 2012-12-30 DIAGNOSIS — Z299 Encounter for prophylactic measures, unspecified: Secondary | ICD-10-CM | POA: Insufficient documentation

## 2012-12-30 NOTE — Assessment & Plan Note (Addendum)
She has no evidence of proteniuria on prior evaluation.  If elevated at next  visit will start ACE inhibitor.

## 2012-12-30 NOTE — Assessment & Plan Note (Signed)
Patient's last colonoscopy was 2010 and normal.

## 2012-12-30 NOTE — Assessment & Plan Note (Addendum)
Screening for PMR, SLE, RA, gout, HH  Was done and thus far negative with ANA pending.  Plain films of right shoulder show degenerati ve changes to the [facility] joint .  She may need referral to Rheumatology for articular injection of thumbs but prefers to have a trial of NSAIDs .

## 2012-12-30 NOTE — Assessment & Plan Note (Addendum)
Diagnosis suggested by hgba1c of 6.8, but her fasting glucoses have never been diagnostic .  Low GI diet and exercise  Recommended,  She is losing weight intentionally.

## 2012-12-30 NOTE — Assessment & Plan Note (Signed)
Annual comprehensive exam was done including breast and pelvic exam. She is s/p hysterectomy and has no cervix . All screenings have been addressed .

## 2012-12-31 ENCOUNTER — Encounter: Payer: Self-pay | Admitting: Internal Medicine

## 2012-12-31 DIAGNOSIS — E669 Obesity, unspecified: Secondary | ICD-10-CM | POA: Insufficient documentation

## 2012-12-31 NOTE — Assessment & Plan Note (Signed)
She has lost 22 lbs intentionally since September .  Fasting glucoses remain elevated but not diagnostic of DM. although her a1c was 6.8 last visit.

## 2013-01-01 LAB — ANA: Anti Nuclear Antibody(ANA): NEGATIVE

## 2013-01-03 ENCOUNTER — Encounter: Payer: Self-pay | Admitting: *Deleted

## 2013-02-05 ENCOUNTER — Encounter: Payer: Self-pay | Admitting: General Surgery

## 2013-02-21 ENCOUNTER — Encounter: Payer: Self-pay | Admitting: General Surgery

## 2013-03-01 ENCOUNTER — Ambulatory Visit (INDEPENDENT_AMBULATORY_CARE_PROVIDER_SITE_OTHER): Payer: BC Managed Care – PPO | Admitting: General Surgery

## 2013-03-01 ENCOUNTER — Encounter: Payer: Self-pay | Admitting: General Surgery

## 2013-03-01 VITALS — BP 130/74 | HR 78 | Resp 12 | Ht 65.0 in | Wt 223.0 lb

## 2013-03-01 DIAGNOSIS — Z800 Family history of malignant neoplasm of digestive organs: Secondary | ICD-10-CM

## 2013-03-01 DIAGNOSIS — Z86010 Personal history of colonic polyps: Secondary | ICD-10-CM

## 2013-03-01 NOTE — Patient Instructions (Addendum)
Patient to return in two years for an colonscopy.

## 2013-03-01 NOTE — Progress Notes (Signed)
Patient ID: Cheryl Wallace, female   DOB: [birth year], 62 y.o.   MRN: [id number]  Chief Complaint  Patient presents with  . Other    colon screening    HPI Cheryl Wallace is a 62 y.o. female here today for an evaluation for an colonoscopy . Patient had one in 2011 by Cheryl Wallace. They found an polyp 10 years ago but not in 2011.Patients mother had colon cancer. HPI  Past Medical History  Diagnosis Date  . Obesity   . Depression, major   . Allergic rhinitis   . Tinnitus   . Kidney stones 2011  . Functional ovarian cysts 2011  . Acid reflux     Past Surgical History  Procedure Laterality Date  . Partial hysterectomy  1994    still has ovaries, removed for fibroids  . Oophorectomy  04/2010    bilateral, 7 cm benign tumor left ovary  . Colonoscopy  September 2008    Non-bleeding internal hemorhoids identified.  . Abdominal hysterectomy  1994  . Elbow surgery    . Tubal ligation    . Upper gi endoscopy  2011    Family History  Problem Relation Age of Onset  . Cancer Mother     colon  . Tuberculosis Father   . Diabetes Father   . Heart disease Father     MI x 2  . Diabetes Sister     half sister    Social History History  Substance Use Topics  . Smoking status: Former Games developer  . Smokeless tobacco: Never Used     Comment: remote, quit 20 years ago  . Alcohol Use: 3.0 oz/week    5 Glasses of wine per week     Comment: on occasion, martinis maybe once a month    Allergies  Allergen Reactions  . Penicillins Swelling  . Shellfish Allergy Swelling    Current Outpatient Prescriptions  Medication Sig Dispense Refill  . cholecalciferol (VITAMIN D) 1000 UNITS tablet Take 1,000 Units by mouth daily.      . diclofenac (VOLTAREN) 75 MG EC tablet Take 1 tablet (75 mg total) by mouth 2 (two) times daily.  60 tablet  3  . omeprazole (PRILOSEC OTC) 20 MG tablet Take 1 tablet (20 mg total) by mouth 2 (two) times daily.  60 tablet  6  . HYDROcodone-acetaminophen (NORCO)  10-325 MG per tablet Take 1 tablet by mouth every 8 (eight) hours as needed for pain.  30 tablet  0  . ondansetron (ZOFRAN) 4 MG tablet Take 1 tablet (4 mg total) by mouth every 8 (eight) hours as needed for nausea.  20 tablet  0   No current facility-administered medications for this visit.    Review of Systems Review of Systems  Constitutional: Negative.   Respiratory: Negative.   Cardiovascular: Negative.   Gastrointestinal: Negative.     Blood pressure 130/74, pulse 78, resp. rate 12, height 5\' 5"  (1.651 m), weight 223 lb (101.152 kg).  Physical Exam Physical Exam  Constitutional: She is oriented to person, place, and time. She appears well-developed and well-nourished.  Cardiovascular: Normal rate, regular rhythm and normal heart sounds.   Pulmonary/Chest: Breath sounds normal.  Abdominal: Soft. Bowel sounds are normal. There is hepatomegaly. No hernia.  Lymphadenopathy:    She has no cervical adenopathy.  Neurological: She is alert and oriented to person, place, and time.  Skin: Skin is warm and dry.    Data Reviewed Patient's and his colonoscopy was in 2004.  The procedure note reports a 10 mm polyp was removed in the sigmoid colon. That exam only went to the hepatic flexure. She subsequently underwent a completion barium enema in July 2004 that was normal. The next colonoscopy was in 2008 with finding of only internal hemorrhoids. The patient reports a 2011 exam. This is not present in the hospital database in may of been completed an outpatient facility.  Assessment    If the patient's last colonoscopy was in 2011, and failed to show any polyps, I would not think a follow up exam is required until 2016. Will send a record request to CherylElliott's office to clarify.    Plan    Follow up will be based on when the patient's last colonoscopy was completed.       [name] 03/02/2013, 4:35 PM

## 2013-03-02 ENCOUNTER — Encounter: Payer: Self-pay | Admitting: General Surgery

## 2013-03-07 ENCOUNTER — Encounter: Payer: Self-pay | Admitting: General Surgery

## 2013-03-07 ENCOUNTER — Telehealth: Payer: Self-pay | Admitting: *Deleted

## 2013-03-07 NOTE — Progress Notes (Signed)
Patient ID: Cheryl Wallace, female   DOB: 1951/05/27, 62 y.o.   MRN: [id number]   The colonoscopy in 2004 showed adenomatous polyp of the sigmoid colon.  The 2008 exam showed no polyps.  The patient's family history is notable for mother having colon cancer in her late 35s or early 6s.  It would be appropriate for the patient have a colonoscopy at any time. If she is to negative exam she can likely safely moved to 10 year cycle.  She'll be notified of this recommendation, and can proceed accordingly.

## 2013-03-07 NOTE — Telephone Encounter (Signed)
Notified patient as instructed.  Patient will consider when to have a colonoscopy scheduled.  She states she just has "too many bills right now". She will call us back when able to have this done.

## 2013-03-07 NOTE — Telephone Encounter (Signed)
Message copied by Currie Paris on Wed Mar 07, 2013  1:32 PM ------      Message from: [place], [place] W      Created: Wed Mar 07, 2013 10:33 AM       Please notify the patient that I reviewed her 2004 pathology as well as her 2008 colonoscopy results. I think would be appropriate for her to have a colonoscopy at any time. It's been 6 years since her last exam. If this is exam is normal, she'll likely be able to go to a tenure cycle. ------

## 2013-03-08 ENCOUNTER — Other Ambulatory Visit: Payer: Self-pay | Admitting: *Deleted

## 2013-03-08 MED ORDER — OMEPRAZOLE MAGNESIUM 20 MG PO TBEC: 20.0000 mg | DELAYED_RELEASE_TABLET | Freq: Two times a day (BID) | ORAL | Status: DC

## 2013-03-20 ENCOUNTER — Encounter: Payer: Self-pay | Admitting: Internal Medicine

## 2013-04-14 ENCOUNTER — Emergency Department: Payer: Self-pay | Admitting: Emergency Medicine

## 2013-04-15 LAB — BASIC METABOLIC PANEL
BUN: 19 mg/dL — ABNORMAL HIGH (ref 7–18)
Creatinine: 1.21 mg/dL (ref 0.60–1.30)
EGFR (African American): 56 — ABNORMAL LOW
EGFR (Non-African Amer.): 49 — ABNORMAL LOW
Glucose: 205 mg/dL — ABNORMAL HIGH (ref 65–99)
Osmolality: 286 (ref 275–301)
Sodium: 139 mmol/L (ref 136–145)

## 2013-04-15 LAB — CBC
HGB: 12.3 g/dL (ref 12.0–16.0)
MCH: 26.8 pg (ref 26.0–34.0)
MCHC: 33.3 g/dL (ref 32.0–36.0)
Platelet: 248 10*3/uL (ref 150–440)
RBC: 4.62 10*6/uL (ref 3.80–5.20)
RDW: 15.4 % — ABNORMAL HIGH (ref 11.5–14.5)
WBC: 10.5 10*3/uL (ref 3.6–11.0)

## 2013-04-15 LAB — URINALYSIS, COMPLETE
Bilirubin,UR: NEGATIVE
Glucose,UR: 150 mg/dL (ref 0–75)
Ketone: NEGATIVE
Ph: 6 (ref 4.5–8.0)
Protein: 30
RBC,UR: 22 /HPF (ref 0–5)
Squamous Epithelial: 2
WBC UR: 1 /HPF (ref 0–5)

## 2013-04-28 ENCOUNTER — Other Ambulatory Visit: Payer: Self-pay | Admitting: Internal Medicine

## 2013-04-30 NOTE — Telephone Encounter (Signed)
Last OV 12/29/12

## 2013-05-11 ENCOUNTER — Ambulatory Visit: Payer: Self-pay | Admitting: Physician Assistant

## 2013-07-12 ENCOUNTER — Other Ambulatory Visit: Payer: Self-pay

## 2013-10-15 ENCOUNTER — Other Ambulatory Visit: Payer: Self-pay | Admitting: Internal Medicine

## 2014-01-27 IMAGING — CT CT ABD-PELV W/O CM
1 of 2 series · 15 of 32 positions shown, 19 images · non-contrast
Comparison: None

hematuria Hx kidney stones ...
COMMENTS:

PROCEDURE:     CT  - CT ABDOMEN AND PELVIS W[DATE] [DATE]
RESULT:     Indication: Flank Pain
TECHNIQUE: Multiple axial images from the lung bases to the symphysis pubis
were obtained without oral and without intravenous contrast.

[Series 2: 3mm soft tissue · axial · 0.82mm/px · z∈[+264,+702]mm · 15 of 160 slices shown, 19 images]
[im 7/160  soft-tissue]
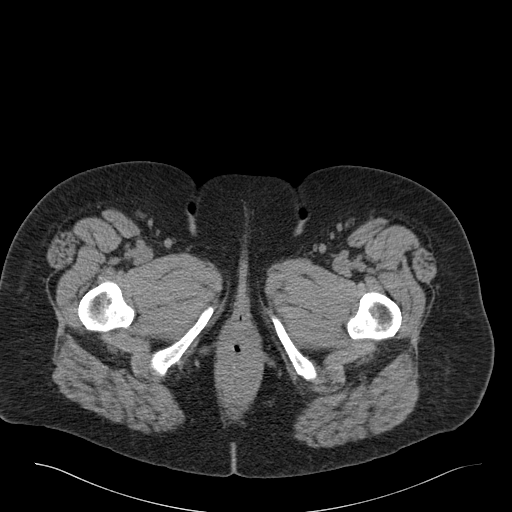
[im 7/160  bone]
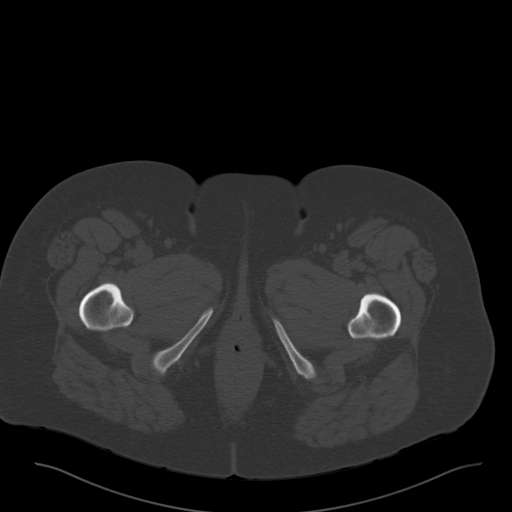
[im 21/160  soft-tissue]
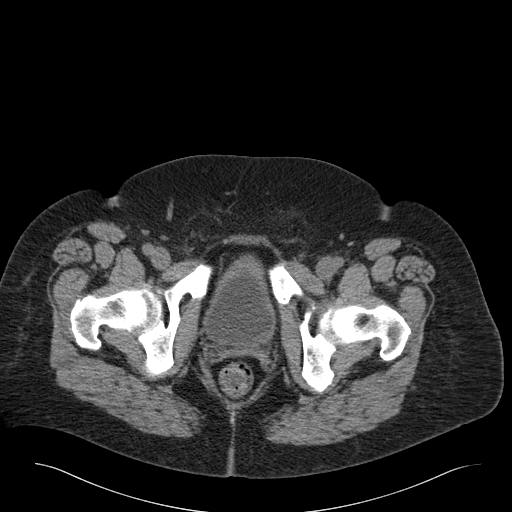
[im 35/160  soft-tissue]
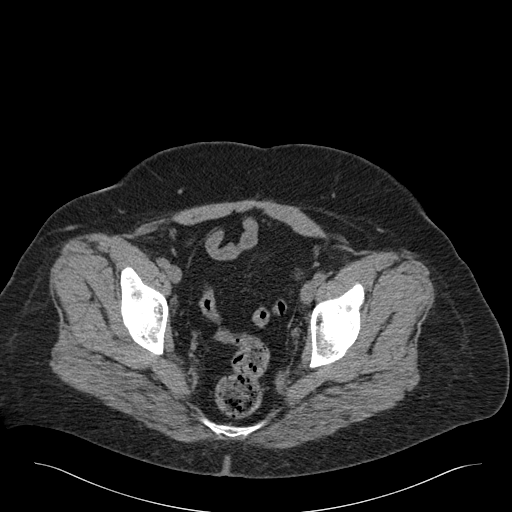
[im 42/160  soft-tissue]
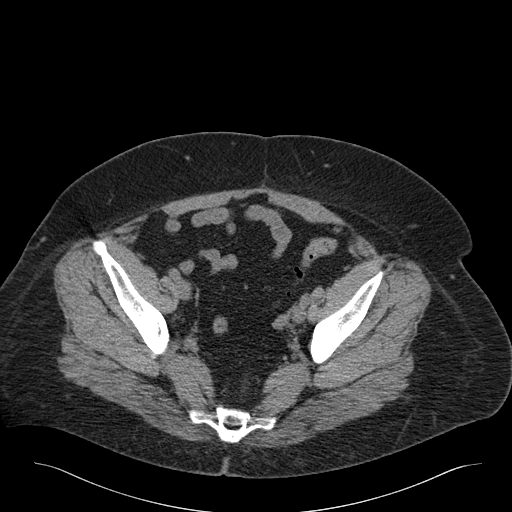
[im 56/160  soft-tissue]
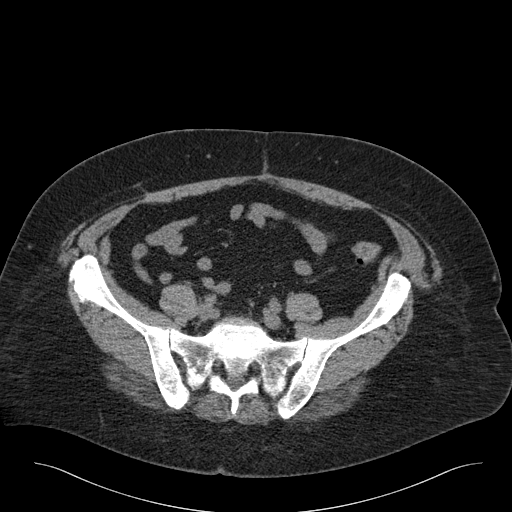
[im 70/160  soft-tissue]
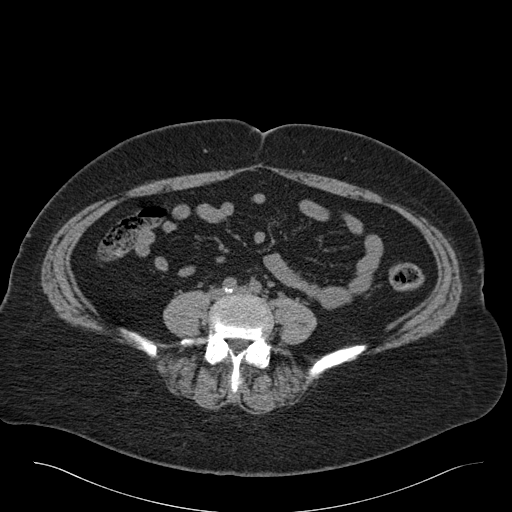
[im 83/160  soft-tissue]
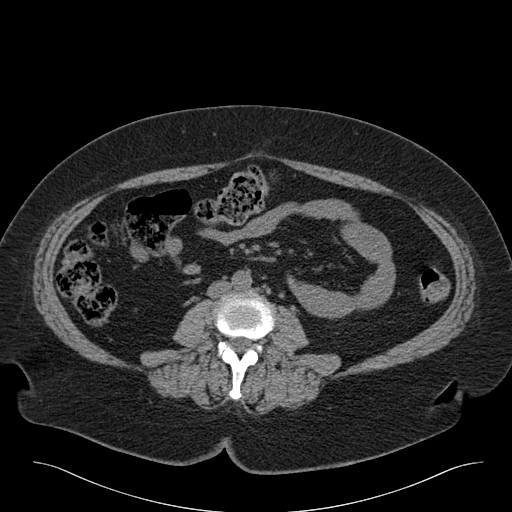
[im 90/160  soft-tissue]
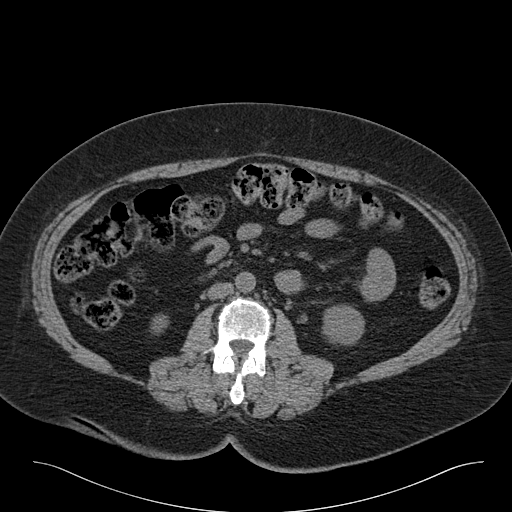
[im 104/160  soft-tissue]
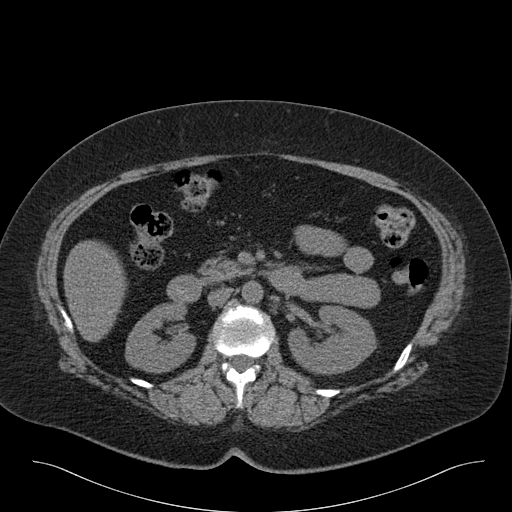
[im 104/160  bone]
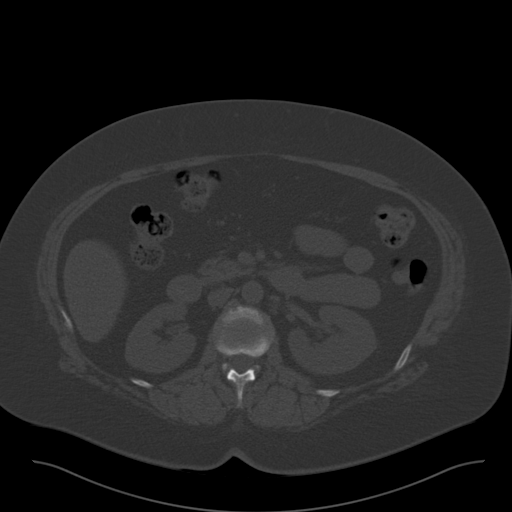
[im 118/160  soft-tissue]
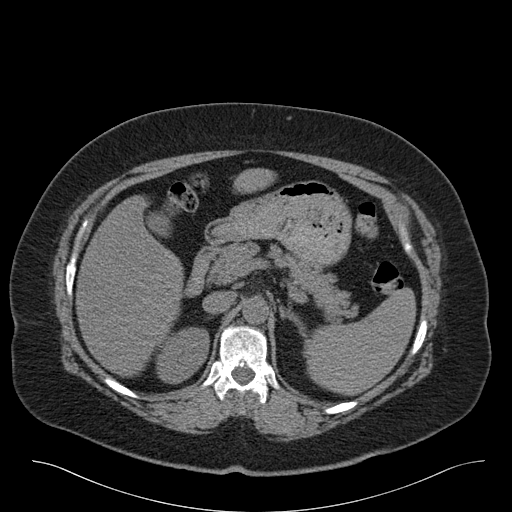
[im 125/160  soft-tissue]
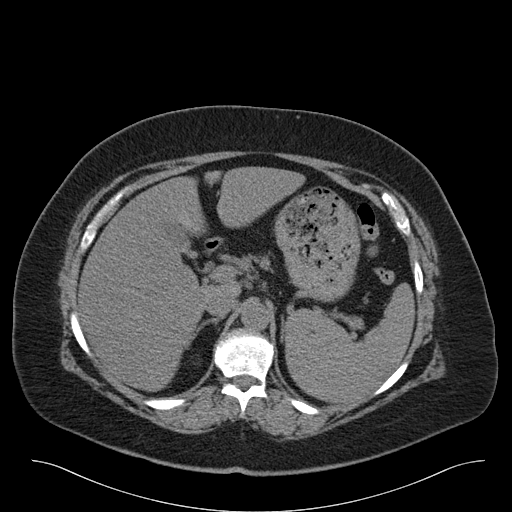
[im 132/160  lung]
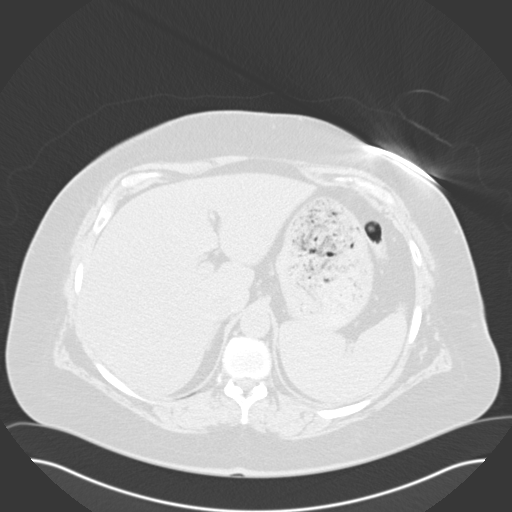
[im 139/160  soft-tissue]
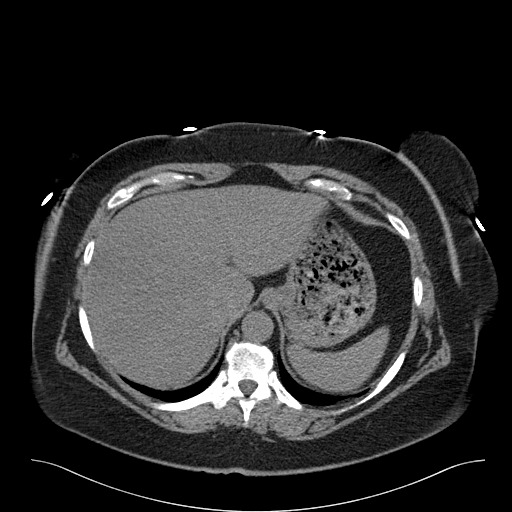
[im 139/160  lung]
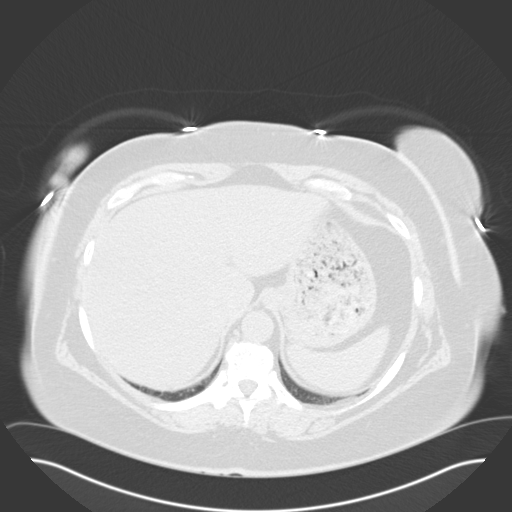
[im 146/160  lung]
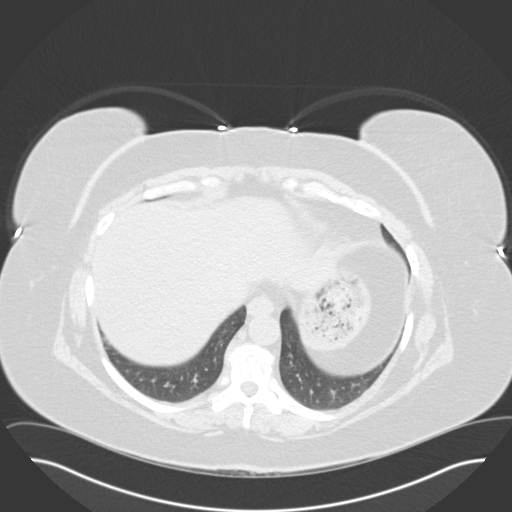
[im 153/160  soft-tissue]
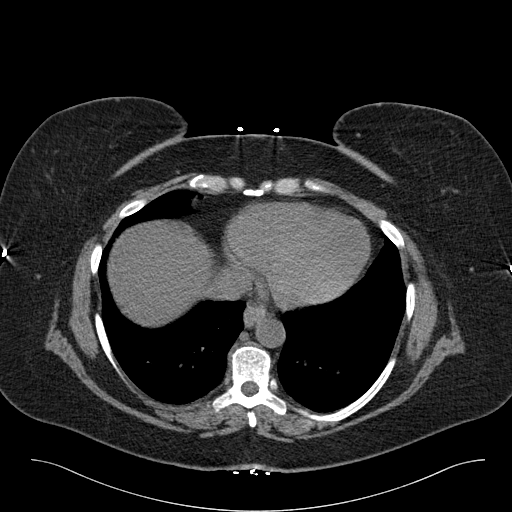
[im 153/160  lung]
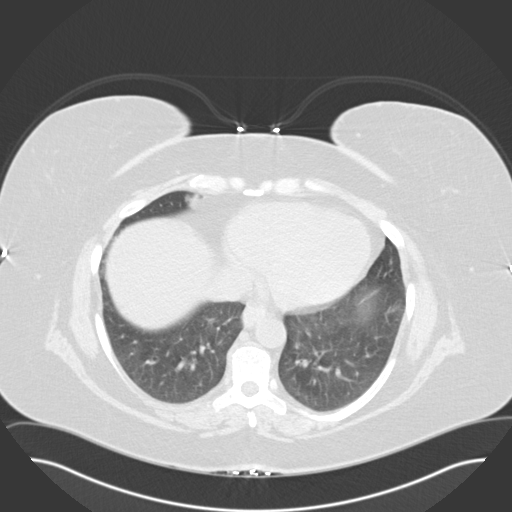

[15 of 32 positions shown; findings below may reference images not displayed]

FINDINGS: The lung bases are clear. There is no pleural or pericardial effusions.

There is a 5 mm calculus at the left UVJ without obstructive uropathy. There
is no other nephrolithiasis. No perinephric stranding is seen. The kidneys
are symmetric in size without evidence for exophytic mass. The bladder is
unremarkable.

The liver is diffusely low in attenuation likely secondary to hepatic
steatosis. There are cholelithiasis. The spleen demonstrates no focal
abnormality. The adrenal glands and pancreas are normal.

The unopacified stomach, duodenum, small intestine, and large intestine are
unremarkable, but evaluation is limited by lack of oral contrast.  There is
no pneumoperitoneum, pneumatosis, or portal venous gas. There is no
abdominal or pelvic free fluid. There is no lymphadenopathy.

The abdominal aorta is normal in caliber.

The osseous structures are unremarkable.
IMPRESSION: 1. There is a 5 mm calculus at the left UVJ without obstructive uropathy. And

2. Hepatic steatosis.

3. Cholelithiasis.

[REDACTED]

## 2014-03-20 ENCOUNTER — Ambulatory Visit (INDEPENDENT_AMBULATORY_CARE_PROVIDER_SITE_OTHER): Payer: BC Managed Care – PPO | Admitting: Internal Medicine

## 2014-03-20 ENCOUNTER — Encounter: Payer: Self-pay | Admitting: Internal Medicine

## 2014-03-20 VITALS — BP 158/88 | HR 88 | Temp 98.8°F | Resp 16 | Ht 65.0 in | Wt 245.8 lb

## 2014-03-20 DIAGNOSIS — E119 Type 2 diabetes mellitus without complications: Secondary | ICD-10-CM

## 2014-03-20 DIAGNOSIS — M79602 Pain in left arm: Secondary | ICD-10-CM

## 2014-03-20 DIAGNOSIS — I10 Essential (primary) hypertension: Secondary | ICD-10-CM

## 2014-03-20 DIAGNOSIS — M25529 Pain in unspecified elbow: Secondary | ICD-10-CM

## 2014-03-20 DIAGNOSIS — G8929 Other chronic pain: Secondary | ICD-10-CM

## 2014-03-20 DIAGNOSIS — R635 Abnormal weight gain: Secondary | ICD-10-CM

## 2014-03-20 DIAGNOSIS — R030 Elevated blood-pressure reading, without diagnosis of hypertension: Secondary | ICD-10-CM

## 2014-03-20 DIAGNOSIS — E669 Obesity, unspecified: Secondary | ICD-10-CM

## 2014-03-20 DIAGNOSIS — M79609 Pain in unspecified limb: Secondary | ICD-10-CM

## 2014-03-20 DIAGNOSIS — Z87442 Personal history of urinary calculi: Secondary | ICD-10-CM

## 2014-03-20 DIAGNOSIS — M25522 Pain in left elbow: Secondary | ICD-10-CM

## 2014-03-20 LAB — COMPREHENSIVE METABOLIC PANEL
ALBUMIN: 3.6 g/dL (ref 3.5–5.2)
ALT: 18 U/L (ref 0–35)
AST: 17 U/L (ref 0–37)
Alkaline Phosphatase: 90 U/L (ref 39–117)
BUN: 19 mg/dL (ref 6–23)
CALCIUM: 8.9 mg/dL (ref 8.4–10.5)
CHLORIDE: 105 meq/L (ref 96–112)
CO2: 30 meq/L (ref 19–32)
Creatinine, Ser: 0.8 mg/dL (ref 0.4–1.2)
GFR: 78.54 mL/min (ref >60.00)
Glucose, Bld: 196 mg/dL — ABNORMAL HIGH (ref 70–99)
POTASSIUM: 3.7 meq/L (ref 3.5–5.1)
SODIUM: 138 meq/L (ref 135–145)
TOTAL PROTEIN: 6.6 g/dL (ref 6.0–8.3)
Total Bilirubin: 0.4 mg/dL (ref 0.2–1.2)

## 2014-03-20 LAB — LIPID PANEL
CHOL/HDL RATIO: 4
Cholesterol: 172 mg/dL (ref 0–200)
HDL: 45.1 mg/dL (ref >39.00)
LDL Cholesterol: 98 mg/dL (ref 0–99)
NONHDL: 126.9
Triglycerides: 147 mg/dL (ref 0.0–149.0)
VLDL: 29.4 mg/dL (ref 0.0–40.0)

## 2014-03-20 LAB — MICROALBUMIN / CREATININE URINE RATIO
Creatinine,U: 153.7 mg/dL
MICROALB/CREAT RATIO: 1.4 mg/g (ref 0.0–30.0)
Microalb, Ur: 2.1 mg/dL — ABNORMAL HIGH (ref 0.0–1.9)

## 2014-03-20 LAB — TSH: TSH: 2.44 u[IU]/mL (ref 0.35–4.50)

## 2014-03-20 LAB — HEMOGLOBIN A1C: Hgb A1c MFr Bld: 7.7 % — ABNORMAL HIGH (ref 4.6–6.5)

## 2014-03-20 MED ORDER — DICLOFENAC SODIUM 75 MG PO TBEC: DELAYED_RELEASE_TABLET | ORAL | Status: DC

## 2014-03-20 MED ORDER — HYDROCODONE-ACETAMINOPHEN 10-325 MG PO TABS: 1.0000 | ORAL_TABLET | Freq: Every evening | ORAL | Status: DC | PRN

## 2014-03-20 NOTE — Progress Notes (Signed)
Patient ID: Cheryl Wallace, female   DOB: 1950/10/01, 63 y.o.   MRN: [id number]  .  Patient Active Problem List   Diagnosis Date Noted  . Chronic upper extremity pain 03/21/2014  . History of renal calculi 03/21/2014  . Type II or unspecified type diabetes mellitus without mention of complication, not stated as uncontrolled 03/21/2014  . Obesity, unspecified 12/31/2012  . Routine general medical examination at a health care facility 12/30/2012  . Polyarthralgia 12/30/2012  . Family history of colon cancer 12/29/2012  . Rhinitis, allergic 12/21/2011  . Elevated blood-pressure reading without diagnosis of hypertension 09/15/2011  . Personal history of colonic polyps 12/27/2002    Subjective:  CC:   Chief Complaint  Patient presents with  . Follow-up    medication refill, patient c/o left arm pain since having bp evaluated in ED.    HPI:   Cheryl Wallace is a 63 y.o. female who presents for follow up on multiple issues,  Last seen June 2014  1) Er visit in August for severe right flank pain . The only  Workup done was a UA which was reportedly  positive for blood.  She was not evaluated by ER PHysician but told that based on UA she had a kidney stone and discharged home after getting an injection of some unknown medication and given a referral urology.  she states that she was unable to get an appt.  Then in  September developed burning pain on same side. [facility]  [facility] with [facility] scan which noted a right sided renal calculi and finally passed it 4 days later     2) Left arm pain . Reports that during her evaluation in the ER her left arm was injured  During measurement of her  blood pressure.  States that they cuff was inflated twice to maximal pressure and despite her requests to stop it was left in place for several minutes..does not recall any bruising or discolaration, no cyanosis of distal arm, "but I was in too much pain to notice."  Due to persistent pain in  upper arm she saw a chiropractor,  No improvement.  [facility] was given a prednisone taper , which caused agitation and sleeplessness the first night followed by recurrent drenching hot flushes which lasted ana full week.  Her left upper arm continues to be exquisitely painful in the bicep and tricep, more so in the evening, when trying to sleep. Has not been physically active since school was out. sleepiing only 3 hours per night.  Has tried ice packs,  heating pad.  She denies shoulder pain elbow pain and wrist pain.    3) Morbid obesity.  Last year she had  an intentional weight loss of 22 lbs with a low glycemic index dit  but now gained 25 lbs and her BMI now 40.  She is not exercising or following any particular diet   Past Medical History  Diagnosis Date  . Obesity   . Depression, major   . Allergic rhinitis   . Tinnitus   . Kidney stones 2011  . Functional ovarian cysts 2011  . Acid reflux     Past Surgical History  Procedure Laterality Date  . Partial hysterectomy  1994    still has ovaries, removed for fibroids  . Oophorectomy  04/2010    bilateral, 7 cm benign tumor left ovary  . Colonoscopy  September 2008  Non-bleeding internal hemorhoids identified.  . Abdominal hysterectomy  1994  . Elbow surgery    . Tubal ligation    . Upper gi endoscopy  2011       The following portions of the patient's history were reviewed and updated as appropriate: Allergies, current medications, and problem list.    Review of Systems:   Patient denies headache, fevers, malaise, unintentional weight loss, skin rash, eye pain, sinus congestion and sinus pain, sore throat, dysphagia,  hemoptysis , cough, dyspnea, wheezing, chest pain, palpitations, orthopnea, edema, abdominal pain, nausea, melena, diarrhea, constipation, flank pain, dysuria, hematuria, urinary  Frequency, nocturia, numbness, tingling, seizures,  Focal weakness, Loss of consciousness,   Tremor, insomnia, depression, anxiety, and suicidal ideation.     History   Social History  . Marital Status: Divorced    Spouse Name: N/A    Number of Children: N/A  . Years of Education: N/A   Occupational History  . Not on file.   Social History Main Topics  . Smoking status: Former Research scientist (life sciences)  . Smokeless tobacco: Never Used     Comment: remote, quit 20 years ago  . Alcohol Use: 3.0 oz/week    5 Glasses of wine per week     Comment: on occasion, martinis maybe once a month  . Drug Use: No  . Sexual Activity: Not on file   Other Topics Concern  . Not on file   Social History Narrative   Lives alone, exercises occasionally.  Always uses seat belts.  Has well water.    Objective:  Filed Vitals:   03/20/14 1048  BP: 158/88  Pulse: 88  Temp: 98.8 F (37.1 C)  Resp: 16     General appearance: alert, cooperative and appears stated age Ears: normal TM's and external ear canals both ears Throat: lips, mucosa, and tongue normal; teeth and gums normal Neck: no adenopathy, no carotid bruit, supple, symmetrical, trachea midline and thyroid not enlarged, symmetric, no tenderness/mass/nodules Back: symmetric, no curvature. ROM normal. No CVA tenderness. Lungs: clear to auscultation bilaterally Heart: regular rate and rhythm, S1, S2 normal, no murmur, click, rub or gallop Abdomen: soft, non-tender; bowel sounds normal; no masses,  no organomegaly Pulses: 2+ and symmetric Skin: Skin color, texture, turgor normal. No rashes or lesions Lymph nodes: Cervical, supraclavicular, and axillary nodes normal.  Assessment and Plan:  Chronic upper extremity pain attibruted by patient to severe consgtriction by automatic BP cuff wh ch occurred last August in ER.  She has  A difference in circumference of nearly 1 cm (smaller than right arm)  comparative measurement today, DVT unlikely.  It is possiblr hat she has had muscle necrosis but I am not sure how to diagnosis this without ordering  a costly CT.  Will refer to Sports medicine for ultrasound evaluation . I have given her a limited supply of vicodin for her severe pain .  Obesity, unspecified I have addressed  BMI and recommended wt loss of 10% of body weight (24 lbs) over the next 6 months using a low glycemic index diet and regular exercise a minimum of 5 days per week.    Elevated blood-pressure reading without diagnosis of hypertension I am going to start her on medication pending review of the labs were are drawing today.  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  Type II or unspecified type diabetes mellitus without mention of complication, not stated as uncontrolled Will have patient return for follow  up in 2 weeks with new dx of DM .  Metformin prescribed  A total of 40 minutes was spent with patient more than half of which was spent in counseling, reviewing records from other prviders and coordination of care.  Updated Medication List Outpatient Encounter Prescriptions as of 03/20/2014  Medication Sig  . cholecalciferol (VITAMIN D) 1000 UNITS tablet Take 1,000 Units by mouth daily.  . diclofenac (VOLTAREN) 75 MG EC tablet TAKE 1 TABLET BY MOUTH TWICE A DAY  . HYDROcodone-acetaminophen (NORCO) 10-325 MG per tablet Take 1 tablet by mouth at bedtime as needed for severe pain.  [name] lisinopril (PRINIVIL,ZESTRIL) 5 MG tablet Take 1 tablet (5 mg total) by mouth daily.  . metFORMIN (GLUCOPHAGE) 500 MG tablet Take 1 tablet (500 mg total) by mouth 2 (two) times daily with a meal.  . ondansetron (ZOFRAN) 4 MG tablet Take 1 tablet (4 mg total) by mouth every 8 (eight) hours as needed for nausea.  [name] PRILOSEC OTC 20 MG tablet TAKE 1 TABLET (20 MG TOTAL) BY MOUTH 2 (TWO) TIMES DAILY.  . [DISCONTINUED] diclofenac (VOLTAREN) 75 MG EC tablet TAKE 1 TABLET BY MOUTH TWICE A DAY  . [DISCONTINUED] HYDROcodone-acetaminophen (NORCO) 10-325 MG per tablet Take 1 tablet by mouth every 8 (eight) hours as  needed for pain.     Orders Placed This Encounter  Procedures  . Comprehensive metabolic panel  . TSH  . Hemoglobin A1c  . Microalbumin / creatinine urine ratio  . Lipid panel  . Comprehensive metabolic panel  . Hemoglobin A1c  . Lipid panel  . Ambulatory referral to Sports Medicine    No Follow-up on file.

## 2014-03-20 NOTE — Patient Instructions (Signed)
Stop the aleve and ibuprofen and resume diclofenac twice daily  vicodin as needed  at bedtime  Referral to Dr Tamala Julian for evaluation of possible muscle  Tear or sprain   Your blood pressure is elevated .  Depending on your labs today,  I will need to start you on a medication to lower it  Hypertension Hypertension, commonly called high blood pressure, is when the force of blood pumping through your arteries is too strong. Your arteries are the blood vessels that carry blood from your heart throughout your body. A blood pressure reading consists of a higher number over a lower number, such as 110/72. The higher number (systolic) is the pressure inside your arteries when your heart pumps. The lower number (diastolic) is the pressure inside your arteries when your heart relaxes. Ideally you want your blood pressure below 120/80. Hypertension forces your heart to work harder to pump blood. Your arteries may become narrow or stiff. Having hypertension puts you at risk for heart disease, stroke, and other problems.  RISK FACTORS Some risk factors for high blood pressure are controllable. Others are not.  Risk factors you cannot control include:   Race. You may be at higher risk if you are African American.  Age. Risk increases with age.  Gender. Men are at higher risk than women before age 22 years. After age 78, women are at higher risk than men. Risk factors you can control include:  Not getting enough exercise or physical activity.  Being overweight.  Getting too much fat, sugar, calories, or salt in your diet.  Drinking too much alcohol. SIGNS AND SYMPTOMS Hypertension does not usually cause signs or symptoms. Extremely high blood pressure (hypertensive crisis) may cause headache, anxiety, shortness of breath, and nosebleed. DIAGNOSIS  To check if you have hypertension, your health care provider will measure your blood pressure while you are seated, with your arm held at the level of your  heart. It should be measured at least twice using the same arm. Certain conditions can cause a difference in blood pressure between your right and left arms. A blood pressure reading that is higher than normal on one occasion does not mean that you need treatment. If one blood pressure reading is high, ask your health care provider about having it checked again. TREATMENT  Treating high blood pressure includes making lifestyle changes and possibly taking medication. Living a healthy lifestyle can help lower high blood pressure. You may need to change some of your habits. Lifestyle changes may include:  Following the DASH diet. This diet is high in fruits, vegetables, and whole grains. It is low in salt, red meat, and added sugars.  Getting at least 2 1/2 hours of brisk physical activity every week.  Losing weight if necessary.  Not smoking.  Limiting alcoholic beverages.  Learning ways to reduce stress. If lifestyle changes are not enough to get your blood pressure under control, your health care provider may prescribe medicine. You may need to take more than one. Work closely with your health care provider to understand the risks and benefits. HOME CARE INSTRUCTIONS  Have your blood pressure rechecked as directed by your health care provider.   Only take medicine as directed by your health care provider. Follow the directions carefully. Blood pressure medicines must be taken as prescribed. The medicine does not work as well when you skip doses. Skipping doses also puts you at risk for problems.   Do not smoke.   Monitor your blood pressure  at home as directed by your health care provider. SEEK MEDICAL CARE IF:   You think you are having a reaction to medicines taken.  You have recurrent headaches or feel dizzy.  You have swelling in your ankles.  You have trouble with your vision. SEEK IMMEDIATE MEDICAL CARE IF:  You develop a severe headache or confusion.  You have  unusual weakness, numbness, or feel faint.  You have severe chest or abdominal pain.  You vomit repeatedly.  You have trouble breathing. MAKE SURE YOU:   Understand these instructions.  Will watch your condition.  Will get help right away if you are not doing well or get worse. Document Released: 08/23/2005 Document Revised: 08/28/2013 Document Reviewed: 06/15/2013 [facility] Patient Information 2015 [place], [place]. This information is not intended to replace advice given to you by your health care provider. Make sure you discuss any questions you have with your health care provider.

## 2014-03-20 NOTE — Progress Notes (Signed)
Pre-visit discussion using our clinic review tool. No additional management support is needed unless otherwise documented below in the visit note.  

## 2014-03-21 DIAGNOSIS — M79603 Pain in arm, unspecified: Secondary | ICD-10-CM

## 2014-03-21 DIAGNOSIS — Z87442 Personal history of urinary calculi: Secondary | ICD-10-CM | POA: Insufficient documentation

## 2014-03-21 DIAGNOSIS — E1121 Type 2 diabetes mellitus with diabetic nephropathy: Secondary | ICD-10-CM | POA: Insufficient documentation

## 2014-03-21 DIAGNOSIS — G8929 Other chronic pain: Secondary | ICD-10-CM | POA: Insufficient documentation

## 2014-03-21 MED ORDER — METFORMIN HCL 500 MG PO TABS: 500.0000 mg | ORAL_TABLET | Freq: Two times a day (BID) | ORAL | Status: DC

## 2014-03-21 MED ORDER — LISINOPRIL 5 MG PO TABS: 5.0000 mg | ORAL_TABLET | Freq: Every day | ORAL | Status: DC

## 2014-03-21 NOTE — Assessment & Plan Note (Addendum)
I am going to start her on medication pending review of the labs were are drawing today.  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

## 2014-03-21 NOTE — Assessment & Plan Note (Signed)
I have addressed  BMI and recommended wt loss of 10% of body weight (24 lbs) over the next 6 months using a low glycemic index diet and regular exercise a minimum of 5 days per week.

## 2014-03-21 NOTE — Assessment & Plan Note (Addendum)
attibruted by patient to severe consgtriction by automatic BP cuff wh ch occurred last August in ER.  She has  A difference in circumference of nearly 1 cm (smaller than right arm)  comparative measurement today, DVT unlikely.  It is possiblr hat she has had muscle necrosis but I am not sure how to diagnosis this without ordering a costly CT.  Will refer to Sports medicine for ultrasound evaluation . I have given her a limited supply of vicodin for her severe pain .

## 2014-03-21 NOTE — Assessment & Plan Note (Signed)
Will have patient return for follow up in 2 weeks with new dx of DM .  Metformin prescribed

## 2014-03-22 ENCOUNTER — Encounter: Payer: Self-pay | Admitting: Internal Medicine

## 2014-04-03 ENCOUNTER — Ambulatory Visit (INDEPENDENT_AMBULATORY_CARE_PROVIDER_SITE_OTHER): Payer: BC Managed Care – PPO | Admitting: Family Medicine

## 2014-04-03 ENCOUNTER — Other Ambulatory Visit (INDEPENDENT_AMBULATORY_CARE_PROVIDER_SITE_OTHER): Payer: BC Managed Care – PPO

## 2014-04-03 ENCOUNTER — Encounter: Payer: Self-pay | Admitting: Family Medicine

## 2014-04-03 VITALS — BP 142/72 | HR 88 | Ht 65.0 in | Wt 245.0 lb

## 2014-04-03 DIAGNOSIS — M79609 Pain in unspecified limb: Secondary | ICD-10-CM

## 2014-04-03 DIAGNOSIS — D1722 Benign lipomatous neoplasm of skin and subcutaneous tissue of left arm: Secondary | ICD-10-CM

## 2014-04-03 DIAGNOSIS — M75100 Unspecified rotator cuff tear or rupture of unspecified shoulder, not specified as traumatic: Secondary | ICD-10-CM

## 2014-04-03 DIAGNOSIS — IMO0002 Reserved for concepts with insufficient information to code with codable children: Secondary | ICD-10-CM

## 2014-04-03 DIAGNOSIS — M79602 Pain in left arm: Secondary | ICD-10-CM

## 2014-04-03 DIAGNOSIS — M7542 Impingement syndrome of left shoulder: Secondary | ICD-10-CM

## 2014-04-03 DIAGNOSIS — D1739 Benign lipomatous neoplasm of skin and subcutaneous tissue of other sites: Secondary | ICD-10-CM

## 2014-04-03 NOTE — Assessment & Plan Note (Addendum)
Injection given today with improvement.  Icing protocol, HEP,  Topical anti-inflammatory is given as well. Patient will come back for 3 weeks for further followup and evaluation.

## 2014-04-03 NOTE — Patient Instructions (Signed)
Good ot meet you Injection to left shoulder.  Ice 20 minutes 2 times daily. Usually after activity and before bed. Exercises 3 times a week.  Pennsiad up ot 2 times daily for pain.  Compression sleeve to arm, I think you do have a lipoma.  Come back in 3 weeks to check in if not perfect will consider an MRI.

## 2014-04-03 NOTE — Assessment & Plan Note (Signed)
It appears the patient does have a lipoma versus a mild necrotic muscle. I do not see anything that is significantly concerning in this arm but patient does say it is tender. Patient continues to have pain I would consider further evaluation with further imaging do to the chronicity of this problem. Patient did have some relief with the injection into the shoulder and am hoping that this is secondary to referred pain. Due to patient's body habitus this is difficult to assess. Patient does not have any fevers or chills or abscess is very unlikely. Patient also denies any recent illnesses. Denies any breakdown in the skin. Patient will come back in 3 weeks for further evaluation and treatment.

## 2014-04-03 NOTE — Progress Notes (Signed)
[name] [address] [place], [place] [address] Phone: [phone] Subjective:    I'm seeing this patient by the request  of:  TULLO,TERESA, MD   CC: Left arm pain  HEN:IDPOEUMPNT Cheryl Wallace is a 63 y.o. female coming in with complaint of left arm pain. Patient states that this started after an emergency department visit after having a blood pressure cuff on too long. Patient states since then her midarm continues to have pain. Patient has been trying home modalities including icing as well as ibuprofen and Tylenol without any significant improvement. Patient stated that more of a dull aching pain that is worse with certain movements. Patient states it even to touch and can be painful. States that it does not seem to be improving. Patient is also try going to a chiropractor without any significant improvement. Patient was given prednisone which side effects made being significantly worse. Patient has not been able to be physically active secondary to this pain. Rates the severity of 7/10.     Past medical history, social, surgical and family history all reviewed in electronic medical record.   Review of Systems: No headache, visual changes, nausea, vomiting, diarrhea, constipation, dizziness, abdominal pain, skin rash, fevers, chills, night sweats, weight loss, swollen lymph nodes, body aches, joint swelling, muscle aches, chest pain, shortness of breath, mood changes.   Objective Blood pressure 142/72, pulse 88, height 5\' 5"  (1.651 m), weight 245 lb (111.131 kg), SpO2 96.00%.  General: No apparent distress alert and oriented x3 mood and affect normal, dressed appropriately.  HEENT: Pupils equal, extraocular movements intact  Respiratory: Patient's speak in full sentences and does not appear short of breath  Cardiovascular: No lower extremity edema, non tender, no erythema  Skin: Warm dry intact with no signs of infection or rash on extremities or on  axial skeleton.  Abdomen: Soft nontender  Neuro: Cranial nerves II through XII are intact, neurovascularly intact in all extremities with 2+ DTRs and 2+ pulses.  Lymph: No lymphadenopathy of posterior or anterior cervical chain or axillae bilaterally.  Gait normal with good balance and coordination.  MSK:  Non tender with full range of motion and good stability and symmetric strength and tone of  elbows, wrist, hip, knee and ankles bilaterally.  Shoulder: left Inspection reveals the patient does have fullness in the middle humerus in the soft tissue but does have a firm mass like feeling under the bicep and above the tricep. Moderately tender to palpation.  ROM is full in all planes passively. Rotator cuff strength normal throughout. signs of impingement with positive Neer and Hawkin's tests, but negative empty can sign. Speeds and Yergason's tests normal. No labral pathology noted with negative Obrien's, negative clunk and good stability. Normal scapular function observed. No painful arc and no drop arm sign. No apprehension sign  MSK US performed of: left This study was ordered, performed, and interpreted by Charlann Boxer D.O.  Shoulder:   Supraspinatus:  Appears normal on long and transverse views, Bursal bulge seen with shoulder abduction on impingement view. Infraspinatus:  Appears normal on long and transverse views. Significant increase in Doppler flow Subscapularis:  Appears normal on long and transverse views. Positive bursa Teres Minor:  Appears normal on long and transverse views. AC joint:  Capsule undistended, no geyser sign. Glenohumeral Joint:  Appears normal without effusion. Glenoid Labrum:  Intact without visualized tears. Biceps Tendon:  Appears normal on long and transverse views, no fraying of tendon, tendon  located in intertubercular groove, no subluxation with shoulder internal or external rotation. In addition it is going down into the bicep muscle patient has what  appears to be a heterotropic mass in the arm. This corresponds fairly well with the potential for a lipoma.  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  Verbal informed consent obtained.  Time-out conducted.  Noted no overlying erythema, induration, or other signs of local infection.  Skin prepped in a sterile fashion.  Local anesthesia: Topical Ethyl chloride.  With sterile technique and under real time ultrasound guidance:  Joint visualized.  23g 1  inch needle inserted posterior approach. Pictures taken for needle placement. Patient did have injection of 2 cc of 1% lidocaine, 2 cc of 0.5% Marcaine, and 1.0 cc of Kenalog 40 mg/dL. Completed without difficulty  Pain immediately resolved suggesting accurate placement of the medication.  Advised to call if fevers/chills, erythema, induration, drainage, or persistent bleeding.  Images permanently stored and available for review in the ultrasound unit.  Impression: Technically successful ultrasound guided injection.     Impression and Recommendations:     This case required medical decision making of moderate complexity.

## 2014-04-24 ENCOUNTER — Encounter: Payer: Self-pay | Admitting: Family Medicine

## 2014-04-24 ENCOUNTER — Ambulatory Visit (INDEPENDENT_AMBULATORY_CARE_PROVIDER_SITE_OTHER): Payer: BC Managed Care – PPO | Admitting: Family Medicine

## 2014-04-24 VITALS — BP 140/72 | HR 77 | Ht 65.0 in | Wt 240.0 lb

## 2014-04-24 DIAGNOSIS — M7542 Impingement syndrome of left shoulder: Secondary | ICD-10-CM

## 2014-04-24 DIAGNOSIS — M75100 Unspecified rotator cuff tear or rupture of unspecified shoulder, not specified as traumatic: Secondary | ICD-10-CM

## 2014-04-24 DIAGNOSIS — IMO0002 Reserved for concepts with insufficient information to code with codable children: Secondary | ICD-10-CM

## 2014-04-24 DIAGNOSIS — D1739 Benign lipomatous neoplasm of skin and subcutaneous tissue of other sites: Secondary | ICD-10-CM

## 2014-04-24 DIAGNOSIS — D1722 Benign lipomatous neoplasm of skin and subcutaneous tissue of left arm: Secondary | ICD-10-CM

## 2014-04-24 MED ORDER — GABAPENTIN 100 MG PO CAPS: 100.0000 mg | ORAL_CAPSULE | Freq: Every day | ORAL | Status: DC

## 2014-04-24 NOTE — Assessment & Plan Note (Signed)
Patient still had is benign mass that has not changed in size. Patient has no systemic findings and I do not think that this is anything other than a lipoma. We discussed over that and for further evaluation an MRI would be recommended. Patient declined. We discussed continuing the compression in the Icing which seems to be helpful . Differential also includes potential to resolving muscle hematoma. Continue to monitor at followup

## 2014-04-24 NOTE — Assessment & Plan Note (Signed)
Patient shoulder pain has improved overall. She continues to do relatively well. I do think the patient still has a mild impingement syndrome of the shoulder that is giving her some radicular pain the patient is even tender to palpation over the lipoma. Patient will continue the exercises 3 times a week for another 6 weeks we discussed an icing protocol. Patient will cut be back to see me again  Spent greater than 25 minutes with patient face-to-face and had greater than 50% of counseling including as described above in assessment and plan.

## 2014-04-24 NOTE — Patient Instructions (Signed)
It is good to see you.  I am glad your shoulder is doing better.  Ice is your friend Continue the compression Gabapentin 100mg  at night.  In 1 week if not better try 200mg  at night.  See you again in 3 weeks.

## 2014-04-24 NOTE — Progress Notes (Signed)
  [facility] [address] [place], [place] [address] Phone: [phone] Subjective:    CC: Left arm pain for followup  OIN:[id number] Cheryl Wallace is a 63 y.o. female coming in with complaint of left arm pain. Patient was seen previously and did have a subacromial bursitis as well as a potential lipoma in her upper arm. Patient was given an intra-articular injection into the shoulder at last followup and given a home exercise program. Patient states overall she is approximately 70% better. Patient states that she still has some pain at the end of a long day. Continues to wear the compression sleeve as well as icing.     Past medical history, social, surgical and family history all reviewed in electronic medical record.   Review of Systems: No headache, visual changes, nausea, vomiting, diarrhea, constipation, dizziness, abdominal pain, skin rash, fevers, chills, night sweats, weight loss, swollen lymph nodes, body aches, joint swelling, muscle aches, chest pain, shortness of breath, mood changes.   Objective Blood pressure 140/72, pulse 77, height 5\' 5"  (1.651 m), weight 240 lb (108.863 kg), SpO2 97.00%.  General: No apparent distress alert and oriented x3 mood and affect normal, dressed appropriately.  HEENT: Pupils equal, extraocular movements intact  Respiratory: Patient's speak in full sentences and does not appear short of breath  Cardiovascular: No lower extremity edema, non tender, no erythema  Skin: Warm dry intact with no signs of infection or rash on extremities or on axial skeleton.  Abdomen: Soft nontender  Neuro: Cranial nerves II through XII are intact, neurovascularly intact in all extremities with 2+ DTRs and 2+ pulses.  Lymph: No lymphadenopathy of posterior or anterior cervical chain or axillae bilaterally.  Gait normal with good balance and coordination.  MSK:  Non tender with full range of motion and good stability and symmetric strength  and tone of  elbows, wrist, hip, knee and ankles bilaterally.  Shoulder: left Inspection reveals the patient does have fullness in the middle humerus in the soft tissue but does have a firm mass like feeling under the bicep and above the tricep. Moderately tender to palpation. No  change in size from previous exam ROM is full in all planes passively. Rotator cuff strength normal throughout. signs of impingement with positive Neer and Hawkin's tests, but negative empty can sign. This is moderately better than previous exam Speeds and Yergason's tests normal. No labral pathology noted with negative Obrien's, negative clunk and good stability. Normal scapular function observed. No painful arc and no drop arm sign. No apprehension sign      Impression and Recommendations:     This case required medical decision making of moderate complexity.

## 2014-04-29 ENCOUNTER — Ambulatory Visit (INDEPENDENT_AMBULATORY_CARE_PROVIDER_SITE_OTHER): Payer: BC Managed Care – PPO | Admitting: Internal Medicine

## 2014-04-30 ENCOUNTER — Telehealth: Payer: Self-pay | Admitting: Internal Medicine

## 2014-04-30 NOTE — Telephone Encounter (Signed)
Patient cancelled 3 pm appointment for 04/29/14, Patient did leave a list of questions, Patient requesting patches for sea sickness. What is a normal blood sugar level? What should patient blood sugar level be? Why does she have to record her blood sugar levels? Does it matter which monitor patient uses? Why was patient prescribed Blood pressure medication? Could Prilosec cause Patient blood pressure to be high? ( patient states she has been on Prilosec for 10 years) Patient ask for something she can take for Acid reflux stated she is out of Prilosec OTC and her cough is back. Patient was given the [facility] low Glycemic diet by a friend ( same as we use) is safe to use. Nurse can answer questions with MD approval, Or please advise of preferences Placed copy of questions written by patient in Red folder.[name]

## 2014-04-30 NOTE — Telephone Encounter (Signed)
Way too many questions.,  Patient can follow the low GI diet but needs to make appt .

## 2014-04-30 NOTE — Telephone Encounter (Signed)
Left message for patient to return call to office. 

## 2014-05-08 NOTE — Telephone Encounter (Signed)
Sent mychart message with response. Has appointment already scheduled for 05/09/14

## 2014-05-09 ENCOUNTER — Ambulatory Visit (INDEPENDENT_AMBULATORY_CARE_PROVIDER_SITE_OTHER): Payer: BC Managed Care – PPO | Admitting: Internal Medicine

## 2014-05-09 ENCOUNTER — Encounter: Payer: Self-pay | Admitting: *Deleted

## 2014-05-09 ENCOUNTER — Encounter: Payer: Self-pay | Admitting: Internal Medicine

## 2014-05-09 VITALS — BP 138/60 | HR 78 | Temp 98.5°F | Resp 16 | Ht 65.0 in | Wt 238.0 lb

## 2014-05-09 DIAGNOSIS — I10 Essential (primary) hypertension: Secondary | ICD-10-CM

## 2014-05-09 DIAGNOSIS — E119 Type 2 diabetes mellitus without complications: Secondary | ICD-10-CM

## 2014-05-09 DIAGNOSIS — E1169 Type 2 diabetes mellitus with other specified complication: Secondary | ICD-10-CM

## 2014-05-09 DIAGNOSIS — E669 Obesity, unspecified: Secondary | ICD-10-CM

## 2014-05-09 DIAGNOSIS — I152 Hypertension secondary to endocrine disorders: Secondary | ICD-10-CM

## 2014-05-09 DIAGNOSIS — E1159 Type 2 diabetes mellitus with other circulatory complications: Secondary | ICD-10-CM

## 2014-05-09 MED ORDER — SCOPOLAMINE 1 MG/3DAYS TD PT72: 1.0000 | MEDICATED_PATCH | TRANSDERMAL | Status: DC

## 2014-05-09 MED ORDER — OMEPRAZOLE 40 MG PO CPDR: 40.0000 mg | DELAYED_RELEASE_CAPSULE | Freq: Every day | ORAL | Status: DC

## 2014-05-09 NOTE — Patient Instructions (Addendum)
Your diabetes is under  diminishing control on your current diet and your a1c has increased to 7.7 (goal is < 7.00).    please make sure you are following a low glycemic index diet and exercising daily (at least going for a walk) .    Your blood pressure is better controlled on lisinopril.  Please continue taking it at night.    Please return in 3 months   Check your sugars once daily for the next two weeks at any of the following times:   fasting  Or postprandial (2 hr after a meal) at  Different times .  Use the sheet to record your sugars and your meal (if you checked it AFTER a meal) and drop it by so I can review them.     A normal fasting blood sugar 80 to 125  A normal post prandial (2 hr after eating)  Is < 150  This is  my version of a  "Low GI"  Diet:  It will still lower your blood sugars and allow you to lose 4 to 8  lbs  per month if you follow it carefully.  Your goal with exercise is a minimum of 30 minutes of aerobic exercise 5 days per week (Walking does not count once it becomes easy!)      All of the foods can be found at grocery stores and in bulk at [employer].  The Atkins protein bars and shakes are available in more varieties at [employer], [employer] and [place].     7 AM Breakfast:  Choose from the following:  Low carbohydrate Protein  Shakes (I recommend the EAS AdvantEdge "Carb Control" shakes  Or the low carb shakes by Atkins.    2.5 carbs   Arnold's "Sandwhich Thin"toasted  w/ peanut butter (no jelly: about 20 net carbs  "Bagel Thin" with cream cheese and salmon: about 20 carbs   a scrambled egg/bacon/cheese burrito made with Mission's "carb balance" whole wheat tortilla  (about 10 net carbs )  A slice of home made fritatta (egg based dish without a crust:  google it)    Avoid cereal and bananas, oatmeal and cream of wheat and grits. They are loaded with carbohydrates!   10 AM: high protein snack  Protein bar by Atkins (the snack size, under 200 cal,  usually < 6 net carbs).    A stick of cheese:  Around 1 carb,  100 cal     Dannon Light n Fit [place] Yogurt  (80 cal, 8 carbs)  Other so called "protein bars" and Greek yogurts tend to be loaded with carbohydrates.  Remember, in food advertising, the word "energy" is synonymous for " carbohydrate."  Lunch:   A Sandwich using the bread choices listed, Can use any  Eggs,  lunchmeat, grilled meat or canned tuna), avocado, regular mayo/mustard  and cheese.  A Salad using blue cheese, ranch,  Goddess or vinagrette,  No croutons or "confetti" and no "candied nuts" but regular nuts OK.   No pretzels or chips.  Pickles and miniature sweet peppers are a good low carb alternative that provide a "crunch"  The bread is the only source of carbohydrate in a sandwich and  can be decreased by trying some of these alternatives to traditional loaf bread  Joseph's makes a pita bread and a flat bread that are 50 cal and 4 net carbs available at [place].  This can be toasted to use with hummous as well  Toufayan makes a low carb flatbread that's 100 cal and 9 net carbs available at [employer] makes 2 sizes of  Low carb whole wheat tortilla  (The large one is 210 cal and 6 net carbs) Avoid "Low fat dressings, as well as Barry Brunner and Heeia dressings They are loaded with sugar!   3 PM/ Mid day  Snack:  Consider  1 ounce of  almonds, walnuts, pistachios, pecans, peanuts,  Macadamia nuts or a nut medley.  Avoid "granola"; the dried cranberries and raisins are loaded with carbohydrates. Mixed nuts as long as there are no raisins,  cranberries or dried fruit.    Try the prosciutto/mozzarella cheese sticks by [employer]  In deli /backery section   High protein   To avoid overindulging in snacks: Try drinking a glass of unsweeted almond/coconut milk  Or a cup of coffee with your Atkins chocolate bar t o keep you from having 3!!!        6 PM  Dinner:     Meat/fowl/fish with a green salad,  and either broccoli, cauliflower, green beans, spinach, brussel sprouts or  Lima beans. DO NOT BREAD THE PROTEIN!!      There is a low carb pasta by Dreamfield's that is acceptable and tastes great: only 5 digestible carbs/serving.( All grocery stores but [employer]Js carry it )  Try Hurley Cisco Angelo's chicken piccata or chicken or eggplant parm over low carb pasta.(Lowes and BJs)   Marjory Lies Sanchez's "Carnitas" (pulled pork, no sauce,  0 carbs) or his beef pot roast to make a dinner burrito (at [employer]J's)  Pesto over low carb pasta (bj's sells a good quality pesto in the center refrigerated section of the deli   Try satueeing  Cheral Marker with mushroooms  Whole wheat pasta is still full of digestible carbs and  Not as low in glycemic index as Dreamfield's.   Brown rice is still rice,  So skip the rice and noodles if you eat [place] or [place] (or at least limit to 1/2 cup)  9 PM snack :   Breyer's "low carb" fudgsicle or  ice cream bar (Carb Smart line), or  Weight Watcher's ice cream bar , or another "no sugar added" ice cream;  a serving of fresh berries/cherries with whipped cream   Cheese or DANNON'S LlGHT N FIT GREEK YOGURT or the Oikos greek yogurt   8 ounces of [place] unsweetened almond/cococunut milk    Avoid bananas, pineapple, grapes  and watermelon on a regular basis because they are high in sugar.  THINK OF THEM AS DESSERT  Remember that snack Substitutions should be less than 10 NET carbs per serving and meals < 20 carbs. Remember to subtract fiber grams to get the "net carbs."

## 2014-05-09 NOTE — Progress Notes (Signed)
Patient ID: Cheryl Wallace, female   DOB: 12-09-1950, 63 y.o.   MRN: [id number]   Patient Active Problem List   Diagnosis Date Noted  . Subacromial impingement of left shoulder 04/03/2014  . 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 Chronic upper extremity pain 03/21/2014  . History of renal calculi 03/21/2014  . Type II or unspecified type diabetes mellitus without mention of complication, not stated as uncontrolled 03/21/2014  . Obesity, unspecified 12/31/2012  . Routine general medical examination at a health care facility 12/30/2012  . Polyarthralgia 12/30/2012  . Family history of colon cancer 12/29/2012  . Rhinitis, allergic 12/21/2011  . Hypertension associated with diabetes 09/15/2011  . Personal history of colonic polyps 12/27/2002    Subjective:  CC:   Chief Complaint  Patient presents with  . Follow-up  . Diabetes    HPI:   Cheryl Wallace is a 63 y.o. female who presents for   Past Medical History  Diagnosis Date  . Obesity   . Depression, major   . Allergic rhinitis   . Tinnitus   . Kidney stones 2011  . Functional ovarian cysts 2011  . Acid reflux     Past Surgical History  Procedure Laterality Date  . Partial hysterectomy  1994    still has ovaries, removed for fibroids  . Oophorectomy  04/2010    bilateral, 7 cm benign tumor left ovary  . Colonoscopy  September 2008    Non-bleeding internal hemorhoids identified.  . Abdominal hysterectomy  1994  . Elbow surgery    . Tubal ligation    . Upper gi endoscopy  2011       The following portions of the patient's history were reviewed and updated as appropriate: Allergies, current medications, and problem list.    Review of Systems:   Patient denies headache, fevers, malaise, unintentional weight loss, skin rash, eye pain, sinus congestion and sinus pain, sore throat, dysphagia,  hemoptysis , cough, dyspnea, wheezing, chest pain, palpitations, orthopnea,  edema, abdominal pain, nausea, melena, diarrhea, constipation, flank pain, dysuria, hematuria, urinary  Frequency, nocturia, numbness, tingling, seizures,  Focal weakness, Loss of consciousness,  Tremor, insomnia, depression, anxiety, and suicidal ideation.     History   Social History  . Marital Status: Divorced    Spouse Name: N/A    Number of Children: N/A  . Years of Education: N/A   Occupational History  . Not on file.   Social History Main Topics  . Smoking status: Former Research scientist (life sciences)  . Smokeless tobacco: Never Used     Comment: remote, quit 20 years ago  . Alcohol Use: 3.0 oz/week    5 Glasses of wine per week     Comment: on occasion, martinis maybe once a month  . Drug Use: No  . Sexual Activity: Not on file   Other Topics Concern  . Not on file   Social History Narrative   Lives alone, exercises occasionally.  Always uses seat belts.  Has well water.    Objective:  Filed Vitals:   05/09/14 0908  BP: 138/60  Pulse: 78  Temp: 98.5 F (36.9 C)  Resp: 16     General appearance: alert, obese, disgruntled , appears stated age  Lungs: clear to auscultation bilaterally Heart: regular rate and rhythm, S1, S2 normal, no murmur, click, rub or gallop Pulses: 2+ and symmetric Skin: Skin color, texture, turgor normal. No rashes or lesions Foot exam:  Nails are  well trimmed,  No callouses,  Sensation intact to microfilament  Assessment and Plan:  Type II or unspecified type diabetes mellitus without mention of complication, not stated as uncontrolled Reminder for annual diabetic eye exam given..  Foot exam done. Meds reviewed and she is on a baby aspirin daily., metformin and lisinopril since last month's visit . Patient asked to check sugars once daily at varying times and submit log via Mychart in one month,.   Return in 3 months.   Hypertension associated with diabetes She was started on lisinopril for mild proteinuria noted last month  Lab Results  Component  Value Date   CREATININE 0.8 03/20/2014   Lab Results  Component Value Date   NA 138 03/20/2014   K 3.7 03/20/2014   CL 105 03/20/2014   CO2 30 03/20/2014   Lab Results  Component Value Date   MICROALBUR 2.1* 03/20/2014     Obesity, unspecified I have congratulated her in reduction of   BMI and encouraged  Continued weight loss with goal of 10% of body weight over the next 6 months using a low glycemic index diet and regular exercise a minimum of 5 days per week.    A total of 30 minutes of face to face time was spent with patient more than half of which was spent in counselling o the above medical issues .   Updated Medication List Outpatient Encounter Prescriptions as of 05/09/2014  Medication Sig  . cholecalciferol (VITAMIN D) 1000 UNITS tablet Take 1,000 Units by mouth daily.  . diclofenac (VOLTAREN) 75 MG EC tablet TAKE 1 TABLET BY MOUTH TWICE A DAY  . gabapentin (NEURONTIN) 100 MG capsule Take 1 capsule (100 mg total) by mouth at bedtime.  [name] lisinopril (PRINIVIL,ZESTRIL) 5 MG tablet Take 1 tablet (5 mg total) by mouth daily.  . metFORMIN (GLUCOPHAGE) 500 MG tablet Take 1 tablet (500 mg total) by mouth 2 (two) times daily with a meal.  . omeprazole (PRILOSEC) 40 MG capsule Take 1 capsule (40 mg total) by mouth daily.  . ondansetron (ZOFRAN) 4 MG tablet Take 1 tablet (4 mg total) by mouth every 8 (eight) hours as needed for nausea.  [name] PRILOSEC OTC 20 MG tablet TAKE 1 TABLET (20 MG TOTAL) BY MOUTH 2 (TWO) TIMES DAILY.  [name] scopolamine (TRANSDERM-SCOP) 1 MG/3DAYS Place 1 patch (1.5 mg total) onto the skin every 3 (three) days.  . [DISCONTINUED] HYDROcodone-acetaminophen (NORCO) 10-325 MG per tablet Take 1 tablet by mouth at bedtime as needed for severe pain.     No orders of the defined types were placed in this encounter.    Return in about 3 months (around 08/08/2014) for follow up diabetes.

## 2014-05-09 NOTE — Progress Notes (Signed)
Patient did not sign controlled substance agreement due to no longer taking Hydrocodone.

## 2014-05-09 NOTE — Progress Notes (Signed)
Pre-visit discussion using our clinic review tool. No additional management support is needed unless otherwise documented below in the visit note.  

## 2014-05-12 NOTE — Assessment & Plan Note (Signed)
She was started on lisinopril for mild proteinuria noted last month  Lab Results  Component Value Date   CREATININE 0.8 03/20/2014   Lab Results  Component Value Date   NA 138 03/20/2014   K 3.7 03/20/2014   CL 105 03/20/2014   CO2 30 03/20/2014   Lab Results  Component Value Date   MICROALBUR 2.1* 03/20/2014

## 2014-05-12 NOTE — Assessment & Plan Note (Signed)
I have congratulated her in reduction of   BMI and encouraged  Continued weight loss with goal of 10% of body weight over the next 6 months using a low glycemic index diet and regular exercise a minimum of 5 days per week.     

## 2014-05-12 NOTE — Assessment & Plan Note (Signed)
Reminder for annual diabetic eye exam given..  Foot exam done. Meds reviewed and she is on a baby aspirin daily., metformin and lisinopril since last month's visit . Patient asked to check sugars once daily at varying times and submit log via Mychart in one month,.   Return in 3 months.

## 2014-05-15 ENCOUNTER — Ambulatory Visit: Payer: BC Managed Care – PPO | Admitting: Family Medicine

## 2014-05-21 ENCOUNTER — Telehealth: Payer: Self-pay | Admitting: *Deleted

## 2014-05-21 NOTE — Telephone Encounter (Signed)
VM left for pt to make f/u appointment for BP recheck 

## 2014-06-13 ENCOUNTER — Encounter: Payer: Self-pay | Admitting: Family Medicine

## 2014-06-13 ENCOUNTER — Ambulatory Visit: Payer: Self-pay | Admitting: Family Medicine

## 2014-06-13 ENCOUNTER — Other Ambulatory Visit: Payer: Self-pay | Admitting: *Deleted

## 2014-06-13 ENCOUNTER — Ambulatory Visit (INDEPENDENT_AMBULATORY_CARE_PROVIDER_SITE_OTHER): Payer: BC Managed Care – PPO | Admitting: Family Medicine

## 2014-06-13 VITALS — BP 122/72 | HR 85 | Ht 65.0 in | Wt 239.0 lb

## 2014-06-13 DIAGNOSIS — M7542 Impingement syndrome of left shoulder: Secondary | ICD-10-CM

## 2014-06-13 DIAGNOSIS — D1722 Benign lipomatous neoplasm of skin and subcutaneous tissue of left arm: Secondary | ICD-10-CM

## 2014-06-13 NOTE — Assessment & Plan Note (Signed)
We discussed this in great detail. In addition continues to have difficulty overall. Patient has failed all other conservative therapy including oral anti-inflammatories, compression, icing, and topical anti-inflammatories, as well as neuromodulator is without any significant improvement. I do feel that an MRI is warranted to delineate further. We discussed with patient that if this does appear to be a lipoma on imaging as well we would need to consider surgical intervention which patient states she would do. I do feel that if we are getting the MRI of the humerus we should probably get an MRI of the shoulder to rule out any radicular symptoms. Patient is in agreement. Patient will come back and see me again when 2 days after the MRI to discuss further.  Spent greater than 25 minutes with patient face-to-face and had greater than 50% of counseling including as described above in assessment and plan.

## 2014-06-13 NOTE — Patient Instructions (Signed)
Good to see you I am sorry your arm still hurts We will get MRI to rule out the lipoma and see what can be done.  Come back 1-2 days after MRI and we will go over the pictures.  Try the pennsaid twice daily.

## 2014-06-13 NOTE — Progress Notes (Signed)
  [name] [place], [place] [address] Phone: [phone] Subjective:    CC: Left arm pain for followup  Cheryl Wallace is a 63 y.o. female coming in with complaint of left arm pain. Patient was seen previously and did have a subacromial bursitis as well as a potential lipoma in her upper arm. Patient has had an intra-articular shoulder injection that did help with a significant amount of her pain. Patient continued to have some pain that was related to more of the overlying lipoma. Patient is here for followup with her continuing to conservative therapy. Patient states that her shoulder still seems to be doing relatively well but does have pain mostly over the humerus. Patient states it still seems to be the best that we are: The lipoma. Patient went on a cruise and states that she did have some dull throbbing sensation in this area. Patient once again denies any shoulder pain, neck pain, or any breast pain recently. Patient states it seems to be just in that area. Patient is taking gabapentin 100 mg at night with no significant improvement and patient still has nighttime awakening.     Past medical history, social, surgical and family history all reviewed in electronic medical record.   Review of Systems: No headache, visual changes, nausea, vomiting, diarrhea, constipation, dizziness, abdominal pain, skin rash, fevers, chills, night sweats, weight loss, swollen lymph nodes, body aches, joint swelling, muscle aches, chest pain, shortness of breath, mood changes.   Objective Blood pressure 122/72, pulse 85, height 5\' 5"  (1.651 m), weight 239 lb (108.41 kg), SpO2 98.00%.  General: No apparent distress alert and oriented x3 mood and affect normal, dressed appropriately.  HEENT: Pupils equal, extraocular movements intact  Respiratory: Patient's speak in full sentences and does not appear short of breath  Cardiovascular: No lower extremity  edema, non tender, no erythema  Skin: Warm dry intact with no signs of infection or rash on extremities or on axial skeleton.  Abdomen: Soft nontender  Neuro: Cranial nerves II through XII are intact, neurovascularly intact in all extremities with 2+ DTRs and 2+ pulses.  Lymph: No lymphadenopathy of posterior or anterior cervical chain or axillae bilaterally.  Gait normal with good balance and coordination.  MSK:  Non tender with full range of motion and good stability and symmetric strength and tone of  elbows, wrist, hip, knee and ankles bilaterally.  Shoulder: left Inspection reveals the patient does have fullness in the middle humerus in the soft tissue but does have a firm mass like feeling under the bicep and above the tricep. Moderately tender to palpation. No change in size from previous exam in still in the same location ROM is full in all planes passively. Rotator cuff strength normal throughout. Mild impingement but significant less than previous exam Speeds and Yergason's tests normal. No labral pathology noted with negative Obrien's, negative clunk and good stability. Normal scapular function observed. No painful arc and no drop arm sign. No apprehension sign      Impression and Recommendations:     This case required medical decision making of moderate complexity.

## 2014-06-24 ENCOUNTER — Ambulatory Visit
Admission: RE | Admit: 2014-06-24 | Discharge: 2014-06-24 | Disposition: A | Discharge status: Home / Self Care | Payer: BC Managed Care – PPO | Source: Ambulatory Visit | Attending: Family Medicine | Admitting: Family Medicine

## 2014-06-24 DIAGNOSIS — D1722 Benign lipomatous neoplasm of skin and subcutaneous tissue of left arm: Secondary | ICD-10-CM

## 2014-06-26 ENCOUNTER — Ambulatory Visit (INDEPENDENT_AMBULATORY_CARE_PROVIDER_SITE_OTHER): Payer: BC Managed Care – PPO | Admitting: Family Medicine

## 2014-06-26 ENCOUNTER — Encounter: Payer: Self-pay | Admitting: Family Medicine

## 2014-06-26 VITALS — BP 130/72 | HR 95 | Ht 65.0 in | Wt 238.0 lb

## 2014-06-26 DIAGNOSIS — G7241 Inclusion body myositis [IBM]: Secondary | ICD-10-CM | POA: Insufficient documentation

## 2014-06-26 MED ORDER — GABAPENTIN 300 MG PO CAPS: 300.0000 mg | ORAL_CAPSULE | Freq: Every day | ORAL | Status: DC

## 2014-06-26 MED ORDER — PREDNISONE 10 MG PO TABS: ORAL_TABLET | ORAL | Status: DC

## 2014-06-26 NOTE — Progress Notes (Signed)
  [facility] Cheryl Wallace, Cheryl Wallace Phone: [phone] Subjective:    CC: Left arm pain for followup  [id number] Cheryl Wallace is a 63 y.o. female coming in with complaint of left arm pain. Patient was seen previously and did have a subacromial bursitis as well as a potential lipoma in her upper arm. Patient has had an intra-articular shoulder injection that did help with a significant amount of her pain. Patient continued to have some pain that was related to more of the overlying lipoma. Patient had failed conservative therapy so she was sent for an MRI. Patient's MRI was reviewed today. Patient's MRI showed some myositis of the deltoid muscle itself. 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 Patient states that she still has not made any significant improvement. States that the shoulder still is feeling relatively well but is only having pain over the arm. Patient states that it does wake her up at night. Gabapentin does help her sleep. Denies any numbness or tingling.    Past medical history, social, surgical and family history all reviewed in electronic medical record.   Review of Systems: No headache, visual changes, nausea, vomiting, diarrhea, constipation, dizziness, abdominal pain, skin rash, fevers, chills, night sweats, weight loss, swollen lymph nodes, body aches, joint swelling, muscle aches, chest pain, shortness of breath, mood changes.   Objective Blood pressure 130/72, pulse 95, height 5\' 5"  (1.651 m), weight 238 lb (107.956 kg), SpO2 97.00%.  General: No apparent distress alert and oriented x3 mood and affect normal, dressed appropriately.  HEENT: Pupils equal, extraocular movements intact  Respiratory: Patient's speak in full sentences and does not appear short of breath  Cardiovascular: No lower extremity edema, non tender, no erythema  Skin: Warm dry intact with no signs of infection or rash on extremities or on axial  skeleton.  Abdomen: Soft nontender  Neuro: Cranial nerves II through XII are intact, neurovascularly intact in all extremities with 2+ DTRs and 2+ pulses.  Lymph: No lymphadenopathy of posterior or anterior cervical chain or axillae bilaterally.  Gait normal with good balance and coordination.  MSK:  Non tender with full range of motion and good stability and symmetric strength and tone of  elbows, wrist, hip, knee and ankles bilaterally.  Shoulder: left Inspection reveals the patient does have fullness in the middle humerus in the soft tissue and palpation over the firm mass is likely the insertion of the deltoid. No change in size from previous exam in still in the same location ROM is full in all planes passively. Rotator cuff strength normal throughout. Mild impingement but significant less than previous exam Speeds and Yergason's tests normal. No labral pathology noted with negative Obrien's, negative clunk and good stability. Normal scapular function observed. No painful arc and no drop arm sign. No apprehension sign No significant change from previous exam.     Impression and Recommendations:     This case required medical decision making of moderate complexity.

## 2014-06-26 NOTE — Patient Instructions (Addendum)
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  Increase gabapentin to 300mg  at night. New prescription will be 300mg  tabs.  Prednisone 40mg  daily for 5 days 20mg  for 5 days 10mg  daily for 5 days then stop Conitnue the compression if it helps See me again in 3

## 2014-06-26 NOTE — Assessment & Plan Note (Addendum)
Discussed with patient at great length. With MRI findings this is going to be somewhat of a difficult pathology. We discussed different treatment options and we have elected to try a prednisone Dosepak. Patient will see if she makes a improvement over the course of the next 3 weeks with this. We discussed continuing gabapentin and try the topical anti-inflammatories. Patient can continue compression if it is any beneficial. Patient will try these interventions and come back again in 3 weeks. If continuing to have difficulty may need to consider starting more advanced medications but over this will be necessary.

## 2014-06-27 ENCOUNTER — Encounter: Payer: Self-pay | Admitting: Family Medicine

## 2014-06-28 ENCOUNTER — Encounter: Payer: Self-pay | Admitting: *Deleted

## 2014-07-02 ENCOUNTER — Encounter: Payer: Self-pay | Admitting: Family Medicine

## 2014-07-16 ENCOUNTER — Other Ambulatory Visit: Payer: Self-pay | Admitting: Internal Medicine

## 2014-07-17 ENCOUNTER — Ambulatory Visit (INDEPENDENT_AMBULATORY_CARE_PROVIDER_SITE_OTHER): Payer: BC Managed Care – PPO | Admitting: Family Medicine

## 2014-07-17 ENCOUNTER — Encounter: Payer: Self-pay | Admitting: Family Medicine

## 2014-07-17 VITALS — BP 138/80 | HR 80 | Ht 65.0 in | Wt 238.0 lb

## 2014-07-17 DIAGNOSIS — G7241 Inclusion body myositis [IBM]: Secondary | ICD-10-CM

## 2014-07-17 MED ORDER — VENLAFAXINE HCL 37.5 MG PO TABS: 37.5000 mg | ORAL_TABLET | Freq: Two times a day (BID) | ORAL | Status: DC

## 2014-07-17 NOTE — Progress Notes (Signed)
  [name] [place], [place] [address] Phone: [phone] Subjective:    CC: Left arm pain for followup  [id number] Cheryl Wallace is a 63 y.o. female coming in with complaint of left arm pain. 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 Patient was started on gabapentin denies any significant improvement so far. Patient states that she does less activity, the pain is better. States that if she does more activity it hurts more. Discussed that it is still a dull aching pain in the compression is helpful. Denies any new pain except that now it is starting to give her some difficulty in the shoulder again patient has had a history of the subacromial bursitis. Still waking her up at night.    Past medical history, social, surgical and family history all reviewed in electronic medical record.   Review of Systems: No headache, visual changes, nausea, vomiting, diarrhea, constipation, dizziness, abdominal pain, skin rash, fevers, chills, night sweats, weight loss, swollen lymph nodes, body aches, joint swelling, muscle aches, chest pain, shortness of breath, mood changes.   Objective Blood pressure 138/80, pulse 80, height 5\' 5"  (1.651 m), weight 238 lb (107.956 kg), SpO2 98 %.  General: No apparent distress alert and oriented x3 mood and affect normal, dressed appropriately.  HEENT: Pupils equal, extraocular movements intact  Respiratory: Patient's speak in full sentences and does not appear short of breath  Cardiovascular: No lower extremity edema, non tender, no erythema  Skin: Warm dry intact with no signs of infection or rash on extremities or on axial skeleton.  Abdomen: Soft nontender  Neuro: Cranial nerves II through XII are intact, neurovascularly intact in all extremities with 2+ DTRs and 2+ pulses.  Lymph: No lymphadenopathy of posterior or anterior cervical chain or axillae bilaterally.  Gait normal with good  balance and coordination.  MSK:  Non tender with full range of motion and good stability and symmetric strength and tone of  elbows, wrist, hip, knee and ankles bilaterally.  Shoulder: left Inspection reveals the patient does have fullness in the middle humerus in the soft tissue and palpation over the insertion of the deltoid.ROM is full in all planes passively. Rotator cuff strength normal throughout. Mild impingement but significant less than previous exam Speeds and Yergason's tests normal. No labral pathology noted with negative Obrien's, negative clunk and good stability. Normal scapular function observed. No painful arc and no drop arm sign. No apprehension sign No significant change from previous exam. Contralateral shoulder unremarkable    Impression and Recommendations:     This case required medical decision making of moderate complexity.

## 2014-07-17 NOTE — Patient Instructions (Signed)
Good to see you Ice is your friend sometimes Compression sleeve still Effexor 37.5mg  twice daily for 2 weeks then 2 pills 2 times daily for 2 week Continue the gabapentin See me again in 4 weeks.

## 2014-07-17 NOTE — Assessment & Plan Note (Signed)
Discussed with patient again that this is very hard to treat a regular basis. We discussed continuing the icing regimen as well as compression. Patient was started on Effexor to see if this would be beneficial as well. Discussed continuing the gabapentin at this time. Discussed potentially changing diet and decreasing inflammatory food. I would like to see patient back again in the major patient does not have any side effects to any medications. Patient will follow-up with me again as stated in the patient instructions.  Spent greater than 25 minutes with patient face-to-face and had greater than 50% of counseling including as described above in assessment and plan.

## 2014-08-07 ENCOUNTER — Other Ambulatory Visit: Payer: BC Managed Care – PPO

## 2014-08-08 ENCOUNTER — Ambulatory Visit: Payer: BC Managed Care – PPO | Admitting: Internal Medicine

## 2014-08-14 ENCOUNTER — Ambulatory Visit (INDEPENDENT_AMBULATORY_CARE_PROVIDER_SITE_OTHER): Payer: BC Managed Care – PPO | Admitting: Family Medicine

## 2014-08-14 ENCOUNTER — Encounter: Payer: Self-pay | Admitting: Family Medicine

## 2014-08-14 VITALS — BP 122/72 | HR 99 | Ht 65.0 in | Wt 233.0 lb

## 2014-08-14 DIAGNOSIS — G7241 Inclusion body myositis [IBM]: Secondary | ICD-10-CM

## 2014-08-14 MED ORDER — VENLAFAXINE HCL 75 MG PO TABS: 75.0000 mg | ORAL_TABLET | Freq: Two times a day (BID) | ORAL | Status: DC

## 2014-08-14 MED ORDER — GABAPENTIN 400 MG PO CAPS: 400.0000 mg | ORAL_CAPSULE | Freq: Every day | ORAL | Status: DC

## 2014-08-14 NOTE — Progress Notes (Signed)
  [name] [place], [place] [address] Phone: [phone] Subjective:    CC: Left arm pain for followup  [id number] Cheryl Wallace is a 63 y.o. female coming in with complaint of left arm pain. 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 Patient was originally started on gabapentin with minimal benefit. Patient was started on Effexor and states that she has noticed approximately a 10% decrease in pain. His lungs patient continues with compression sleeve she is able to do her daily activities. Patient states that she does not wear it unfortunately the pain seems to be significantly worse. Patient denies any new symptoms and denies any side effects to any medications.. Still is waking at night.    Past medical history, social, surgical and family history all reviewed in electronic medical record.   Review of Systems: No headache, visual changes, nausea, vomiting, diarrhea, constipation, dizziness, abdominal pain, skin rash, fevers, chills, night sweats, weight loss, swollen lymph nodes, body aches, joint swelling, muscle aches, chest pain, shortness of breath, mood changes.   Objective Blood pressure 122/72, pulse 99, height 5\' 5"  (1.651 m), weight 233 lb (105.688 kg), SpO2 97 %.  General: No apparent distress alert and oriented x3 mood and affect normal, dressed appropriately.  HEENT: Pupils equal, extraocular movements intact  Respiratory: Patient's speak in full sentences and does not appear short of breath  Cardiovascular: No lower extremity edema, non tender, no erythema  Skin: Warm dry intact with no signs of infection or rash on extremities or on axial skeleton.  Abdomen: Soft nontender  Neuro: Cranial nerves II through XII are intact, neurovascularly intact in all extremities with 2+ DTRs and 2+ pulses.  Lymph: No lymphadenopathy of posterior or anterior cervical chain or axillae bilaterally.  Gait normal with  good balance and coordination.  MSK:  Non tender with full range of motion and good stability and symmetric strength and tone of  elbows, wrist, hip, knee and ankles bilaterally.  Shoulder: left I decrease fullness feeling around the muscle at the insertion of the deltoid but still minorly present. Marland KitchenROM is full in all planes passively. Rotator cuff strength normal throughout. Mild impingement but significant less than previous exam Speeds and Yergason's tests normal. No labral pathology noted with negative Obrien's, negative clunk and good stability. Normal scapular function observed. No painful arc and no drop arm sign. No apprehension sign Contralateral shoulder unremarkable Mild improvement from previous exam   Impression and Recommendations:     This case required medical decision making of moderate complexity.

## 2014-08-14 NOTE — Patient Instructions (Signed)
Good to see you Conitnue the compression Increase gabapentin to 400mg  at night Effexor 75mg  twice daily See me again in 6 weeks.  Happy holidays!

## 2014-08-14 NOTE — Assessment & Plan Note (Signed)
Discussed with patient as well as the pathology and the progression as well as the pathophysiology. We discussed it again at great length. We discussed how the Effexor can be working. I do feel that we should increased the dose of this medication. In addition I we are going to increase the gabapentin to 400 mg at night due to patient's starting wake up earlier from the pain. Patient will try these different changes and minimal come back and see me again in 4-6 weeks for further evaluation and treatment. If continuing to have difficulty we would still have room to go up on the Effexor. Spent greater than 25 minutes with patient face-to-face and had greater than 50% of counseling including as described above in assessment and plan.

## 2014-08-27 ENCOUNTER — Other Ambulatory Visit: Payer: Self-pay | Admitting: Family Medicine

## 2014-09-19 ENCOUNTER — Other Ambulatory Visit: Payer: Self-pay | Admitting: Family Medicine

## 2014-09-23 ENCOUNTER — Telehealth: Payer: Self-pay | Admitting: *Deleted

## 2014-09-23 ENCOUNTER — Other Ambulatory Visit (INDEPENDENT_AMBULATORY_CARE_PROVIDER_SITE_OTHER): Payer: BC Managed Care – PPO

## 2014-09-23 ENCOUNTER — Encounter: Payer: Self-pay | Admitting: Family Medicine

## 2014-09-23 ENCOUNTER — Ambulatory Visit (INDEPENDENT_AMBULATORY_CARE_PROVIDER_SITE_OTHER): Payer: BC Managed Care – PPO | Admitting: Family Medicine

## 2014-09-23 VITALS — BP 146/86 | HR 114 | Ht 65.0 in | Wt 228.0 lb

## 2014-09-23 DIAGNOSIS — I10 Essential (primary) hypertension: Principal | ICD-10-CM

## 2014-09-23 DIAGNOSIS — I152 Hypertension secondary to endocrine disorders: Secondary | ICD-10-CM

## 2014-09-23 DIAGNOSIS — E119 Type 2 diabetes mellitus without complications: Secondary | ICD-10-CM

## 2014-09-23 DIAGNOSIS — G7241 Inclusion body myositis [IBM]: Secondary | ICD-10-CM

## 2014-09-23 DIAGNOSIS — E1159 Type 2 diabetes mellitus with other circulatory complications: Secondary | ICD-10-CM

## 2014-09-23 NOTE — Progress Notes (Signed)
  [name] [place] [address], [place] [address] Phone: [phone] Subjective:    CC: Left arm pain for followup  APO:Cheryl Wallace is a 64 y.o. female coming in with complaint of left arm pain. 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 Patient was originally started on gabapentin with minimal benefit. Patient was started on Effexor  And did have increase to 75mg  after last exam. Patient states that since she's increased that she is noticing again reapproximated 5% improvement. Patient denies any new symptoms. Patient though has recently been sick. Patient has been treated with many different antibiotics. Patient still continues to do daily activities and has been sleeping may be a little better at night.    Past medical history, social, surgical and family history all reviewed in electronic medical record.   Review of Systems: No headache, visual changes, nausea, vomiting, diarrhea, constipation, dizziness, abdominal pain, skin rash, fevers, chills, night sweats, weight loss, swollen lymph nodes, body aches, joint swelling, muscle aches, chest pain, shortness of breath, mood changes.   Objective Blood pressure 146/86, pulse 114, height 5\' 5"  (1.651 m), weight 228 lb (103.42 kg), SpO2 96 %.  General: No apparent distress alert and oriented x3 mood and affect normal, dressed appropriately.  HEENT: Pupils equal, extraocular movements intact  Respiratory: Patient's speak in full sentences and does not appear short of breath  Cardiovascular: No lower extremity edema, non tender, no erythema  Skin: Warm dry intact with no signs of infection or rash on extremities or on axial skeleton.  Abdomen: Soft nontender  Neuro: Cranial nerves II through XII are intact, neurovascularly intact in all extremities with 2+ DTRs and 2+ pulses.  Lymph: No lymphadenopathy of posterior or anterior cervical chain or axillae bilaterally.  Gait  normal with good balance and coordination.  MSK:  Non tender with full range of motion and good stability and symmetric strength and tone of  elbows, wrist, hip, knee and ankles bilaterally.  Shoulder: left I decrease fullness feeling around the muscle at the insertion of the deltoid but still minorly present. No change.  Marland KitchenROM is full in all planes passively. Rotator cuff strength normal throughout. Mild impingement still  Speeds and Yergason's tests normal. No labral pathology noted with negative Obrien's, negative clunk and good stability. Normal scapular function observed. No painful arc and no drop arm sign. No apprehension sign Contralateral shoulder unremarkable   Impression and Recommendations:     This case required medical decision making of moderate complexity.

## 2014-09-23 NOTE — Patient Instructions (Addendum)
Good to see you Ice when you can but not too often Try the pennsaid up to 2 times daily.  Try the knee compression on the arm to see how you are doing.  See me again in 6-8 weeks.

## 2014-09-23 NOTE — Telephone Encounter (Signed)
What labs and dx?  

## 2014-09-23 NOTE — Assessment & Plan Note (Signed)
Patient continues to have difficulty. Patient knows that this can take approximately 2 years to completely resolved. Good news is it does not seem to get any worse. Patient is continuing the gabapentin at night and is chronic continue with the Effexor twice daily. I do not feel that increase in the medication at this time would be beneficial. Discussed with patient at trying to change the pressure in the area and patient didn't get an over-the-counter patellar strap that I think could be helpful. Patient showed wear to wear this. Patient will continue the compression sleeve as well as the icing protocol. Patient come back and see me again in 6-8 weeks for further evaluation.

## 2014-09-24 ENCOUNTER — Encounter: Payer: Self-pay | Admitting: Internal Medicine

## 2014-09-24 ENCOUNTER — Ambulatory Visit (INDEPENDENT_AMBULATORY_CARE_PROVIDER_SITE_OTHER): Payer: BC Managed Care – PPO | Admitting: Internal Medicine

## 2014-09-24 VITALS — BP 160/78 | HR 102 | Resp 14 | Ht 65.0 in | Wt 228.8 lb

## 2014-09-24 DIAGNOSIS — I152 Hypertension secondary to endocrine disorders: Secondary | ICD-10-CM

## 2014-09-24 DIAGNOSIS — E1159 Type 2 diabetes mellitus with other circulatory complications: Secondary | ICD-10-CM

## 2014-09-24 DIAGNOSIS — E119 Type 2 diabetes mellitus without complications: Secondary | ICD-10-CM

## 2014-09-24 DIAGNOSIS — E1169 Type 2 diabetes mellitus with other specified complication: Secondary | ICD-10-CM

## 2014-09-24 DIAGNOSIS — I10 Essential (primary) hypertension: Secondary | ICD-10-CM

## 2014-09-24 LAB — LIPID PANEL
CHOL/HDL RATIO: 4
Cholesterol: 173 mg/dL (ref 0–200)
HDL: 40.6 mg/dL (ref >39.00)
LDL Cholesterol: 97 mg/dL (ref 0–99)
NonHDL: 132.4
Triglycerides: 178 mg/dL — ABNORMAL HIGH (ref 0.0–149.0)
VLDL: 35.6 mg/dL (ref 0.0–40.0)

## 2014-09-24 LAB — COMPREHENSIVE METABOLIC PANEL
ALK PHOS: 88 U/L (ref 39–117)
ALT: 18 U/L (ref 0–35)
AST: 19 U/L (ref 0–37)
Albumin: 3.6 g/dL (ref 3.5–5.2)
BUN: 15 mg/dL (ref 6–23)
CALCIUM: 8.9 mg/dL (ref 8.4–10.5)
CHLORIDE: 104 meq/L (ref 96–112)
CO2: 25 mEq/L (ref 19–32)
Creatinine, Ser: 0.97 mg/dL (ref 0.40–1.20)
GFR: 61.87 mL/min (ref >60.00)
Glucose, Bld: 192 mg/dL — ABNORMAL HIGH (ref 70–99)
POTASSIUM: 3.9 meq/L (ref 3.5–5.1)
Sodium: 138 mEq/L (ref 135–145)
Total Bilirubin: 0.4 mg/dL (ref 0.2–1.2)
Total Protein: 6.3 g/dL (ref 6.0–8.3)

## 2014-09-24 LAB — HEMOGLOBIN A1C: Hgb A1c MFr Bld: 7.2 % — ABNORMAL HIGH (ref 4.6–6.5)

## 2014-09-24 MED ORDER — LOSARTAN POTASSIUM 50 MG PO TABS: 50.0000 mg | ORAL_TABLET | Freq: Every day | ORAL | Status: DC

## 2014-09-24 MED ORDER — TRAMADOL HCL 50 MG PO TABS: 50.0000 mg | ORAL_TABLET | Freq: Three times a day (TID) | ORAL | Status: DC | PRN

## 2014-09-24 NOTE — Patient Instructions (Addendum)
Please take a probiotic ( Align, Floraque or Culturelle or the generic equivalent ) while you are on the antibiotic for a minimum of 2 weeks to prevent a serious antibiotic associated diarrhea  Called clostridium dificile colitis and a vaginal yeast infection    We have added losartan 50  Mg daily for your blood pressure.  Have your pressure rechecked in a week.  Goal is 130/80.  We can increase the dose to 100 mg if we need to for better control.    I am adding tramadol to take up to 3 daily for pain control.    Return in 3 months

## 2014-09-24 NOTE — Progress Notes (Signed)
Pre visit review using our clinic review tool, if applicable. No additional management support is needed unless otherwise documented below in the visit note. 

## 2014-09-24 NOTE — Progress Notes (Signed)
Patient ID: Cheryl Wallace, female   DOB: September 09, 1950, 64 y.o.   MRN: [id number]  Patient Active Problem List   Diagnosis Date Noted  . 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 Subacromial impingement of left shoulder 04/03/2014  . 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 Chronic upper extremity pain 03/21/2014  . History of renal calculi 03/21/2014  . Diabetes mellitus without complication 89/38/1017  . Obesity, unspecified 12/31/2012  . Routine general medical examination at a health care facility 12/30/2012  . Polyarthralgia 12/30/2012  . Family history of colon cancer 12/29/2012  . Rhinitis, allergic 12/21/2011  . Hypertension associated with diabetes 09/15/2011  . Personal history of colonic polyps 12/27/2002    Subjective:  CC:   Chief Complaint  Patient presents with  . Follow-up    3 month followup DM    HPI:   Cheryl Wallace is a 64 y.o. female who presents for 3 month follow up on DM, Hypertension and hyperlipidemia .  Since her last visit she has been to ENT on several occasions.  Initially diagnosed with tonsil stones, then treated for 10 days  for persistent sinusitis,    sick for 6 weeks ,    On cough suppressants and an inhaler.  So far hasn't coughed in 4 days.   She was also seen in walk in clinic for hematuria secondary to UTI.  She was treated and symptoms have resolved.  Has lost 12 lbs through dietarr modifications. She doe not check her sugars often, but her fasting sugar was 141 recently.  She has stopped lisinopril secondary to cough .   Past Medical History  Diagnosis Date  . Obesity   . Depression, major   . Allergic rhinitis   . Tinnitus   . Kidney stones 2011  . Functional ovarian cysts 2011  . Acid reflux     Past Surgical History  Procedure Laterality Date  . Partial hysterectomy  1994    still has ovaries, removed for fibroids  . Oophorectomy  04/2010    bilateral, 7 cm benign tumor left ovary  .  Colonoscopy  September 2008    Non-bleeding internal hemorhoids identified.  . Abdominal hysterectomy  1994  . Elbow surgery    . Tubal ligation    . Upper gi endoscopy  2011       The following portions of the patient's history were reviewed and updated as appropriate: Allergies, current medications, and problem list.    Review of Systems:   Patient denies headache, fevers, malaise, unintentional weight loss, skin rash, eye pain, sinus congestion and sinus pain, sore throat, dysphagia,  hemoptysis , cough, dyspnea, wheezing, chest pain, palpitations, orthopnea, edema, abdominal pain, nausea, melena, diarrhea, constipation, flank pain, dysuria, hematuria, urinary  Frequency, nocturia, numbness, tingling, seizures,  Focal weakness, Loss of consciousness,  Tremor, insomnia, depression, anxiety, and suicidal ideation.     History   Social History  . Marital Status: Divorced    Spouse Name: N/A    Number of Children: N/A  . Years of Education: N/A   Occupational History  . Not on file.   Social History Main Topics  . Smoking status: Former Research scientist (life sciences)  . Smokeless tobacco: Never Used     Comment: remote, quit 20 years ago  . Alcohol Use: 3.0 oz/week    5 Glasses of wine per week     Comment: on occasion, martinis maybe once a month  . Drug Use: No  .  Sexual Activity: Not on file   Other Topics Concern  . Not on file   Social History Narrative   Lives alone, exercises occasionally.  Always uses seat belts.  Has well water.    Objective:  Filed Vitals:   09/24/14 1602  BP: 160/78  Pulse: 102  Resp: 14     General appearance: alert, cooperative and appears stated age Ears: normal TM's and external ear canals both ears Throat: lips, mucosa, and tongue normal; teeth and gums normal Neck: no adenopathy, no carotid bruit, supple, symmetrical, trachea midline and thyroid not enlarged, symmetric, no tenderness/mass/nodules Back: symmetric, no curvature. ROM normal. No CVA  tenderness. Lungs: clear to auscultation bilaterally Heart: regular rate and rhythm, S1, S2 normal, no murmur, click, rub or gallop Abdomen: soft, non-tender; bowel sounds normal; no masses,  no organomegaly Pulses: 2+ and symmetric Skin: Skin color, texture, turgor normal. No rashes or lesions Lymph nodes: Cervical, supraclavicular, and axillary nodes normal.  Assessment and Plan:  Hypertension associated with diabetes Uncontrolled since stopping lisinopril,  Starting losartan.  Lab Results  Component Value Date   CREATININE 0.97 09/23/2014   Lab Results  Component Value Date   NA 138 09/23/2014   K 3.9 09/23/2014   CL 104 09/23/2014   CO2 25 09/23/2014        Diabetes mellitus without complication Improving control.  Reminder for annual diabetic eye exam given..  Foot exam done. Meds reviewed and she is on a baby aspirin daily., metformin and  Lisinopril has been stopped deu to cough and losartan started.  . Patient asked to check sugars once daily at varying times and submit log via Mychart in one month,.   Return in 3 months.   Lab Results  Component Value Date   HGBA1C 7.2* 09/23/2014   Lab Results  Component Value Date   CHOL 173 09/23/2014   HDL 40.60 09/23/2014   LDLCALC 97 09/23/2014   LDLDIRECT 83.2 12/21/2011   TRIG 178.0* 09/23/2014   CHOLHDL 4 09/23/2014         Updated Medication List Outpatient Encounter Prescriptions as of 09/24/2014  Medication Sig  . cholecalciferol (VITAMIN D) 1000 UNITS tablet Take 1,000 Units by mouth daily.  [name] gabapentin (NEURONTIN) 400 MG capsule Take 1 capsule (400 mg total) by mouth at bedtime.  . metFORMIN (GLUCOPHAGE) 500 MG tablet Take 1 tablet (500 mg total) by mouth 2 (two) times daily with a meal.  . PRILOSEC OTC 20 MG tablet TAKE 1 TABLET (20 MG TOTAL) BY MOUTH 2 (TWO) TIMES DAILY.  [name] venlafaxine (EFFEXOR) 75 MG tablet Take 1 tablet (75 mg total) by mouth 2 (two) times daily with a meal.  . losartan (COZAAR) 50  MG tablet Take 1 tablet (50 mg total) by mouth daily.  . traMADol (ULTRAM) 50 MG tablet Take 1 tablet (50 mg total) by mouth every 8 (eight) hours as needed.  . [DISCONTINUED] gabapentin (NEURONTIN) 100 MG capsule TAKE 1 CAPSULE (100 MG TOTAL) BY MOUTH AT BEDTIME.  . [DISCONTINUED] venlafaxine (EFFEXOR) 37.5 MG tablet TAKE 1 TABLET (37.5 MG TOTAL) BY MOUTH 2 (TWO) TIMES DAILY WITH A MEAL.     No orders of the defined types were placed in this encounter.    Return in about 3 months (around 12/24/2014) for follow up diabetes.

## 2014-09-27 ENCOUNTER — Encounter: Payer: Self-pay | Admitting: Internal Medicine

## 2014-09-27 NOTE — Assessment & Plan Note (Addendum)
Uncontrolled since stopping lisinopril,  Starting losartan.  Lab Results  Component Value Date   CREATININE 0.97 09/23/2014   Lab Results  Component Value Date   NA 138 09/23/2014   K 3.9 09/23/2014   CL 104 09/23/2014   CO2 25 09/23/2014

## 2014-09-27 NOTE — Assessment & Plan Note (Signed)
Improving control.  Reminder for annual diabetic eye exam given..  Foot exam done. Meds reviewed and she is on a baby aspirin daily., metformin and  Lisinopril has been stopped deu to cough and losartan started.  . Patient asked to check sugars once daily at varying times and submit log via Mychart in one month,.   Return in 3 months.   Lab Results  Component Value Date   HGBA1C 7.2* 09/23/2014   Lab Results  Component Value Date   CHOL 173 09/23/2014   HDL 40.60 09/23/2014   LDLCALC 97 09/23/2014   LDLDIRECT 83.2 12/21/2011   TRIG 178.0* 09/23/2014   CHOLHDL 4 09/23/2014

## 2014-10-11 ENCOUNTER — Other Ambulatory Visit: Payer: Self-pay | Admitting: Family Medicine

## 2014-10-11 NOTE — Telephone Encounter (Signed)
Refill done.  

## 2014-10-19 ENCOUNTER — Other Ambulatory Visit: Payer: Self-pay | Admitting: Internal Medicine

## 2014-11-11 ENCOUNTER — Other Ambulatory Visit: Payer: Self-pay | Admitting: Family Medicine

## 2014-11-11 NOTE — Telephone Encounter (Signed)
Refill done.  

## 2014-11-18 ENCOUNTER — Encounter: Payer: Self-pay | Admitting: Family Medicine

## 2014-11-18 ENCOUNTER — Ambulatory Visit (INDEPENDENT_AMBULATORY_CARE_PROVIDER_SITE_OTHER): Payer: BC Managed Care – PPO | Admitting: Family Medicine

## 2014-11-18 VITALS — BP 138/76 | HR 93 | Ht 65.0 in | Wt 222.0 lb

## 2014-11-18 DIAGNOSIS — G7241 Inclusion body myositis [IBM]: Secondary | ICD-10-CM

## 2014-11-18 NOTE — Assessment & Plan Note (Signed)
Discussed patient again,  We discussed that time prognosis and the diagnosis is still correct. We discussed that this will continue to take another 6 months up rotated properly occur. We discussed over-the-counter medications a great detail and what could be also beneficial. We discussed icing regimen. Patient did have a cold recently and did respond well to the prednisone. We know that this is mostly inflammatory and hopefully this will decrease in. Patient come back and see me again in 3 months for further evaluation and treatment.  Spent  25 minutes with patient face-to-face and had greater than 50% of counseling including as described above in assessment and plan.

## 2014-11-18 NOTE — Progress Notes (Signed)
  [facility] [place], [place] [address] Phone: [phone] Subjective:    CC: Left arm pain for followup  [id number] Cheryl Wallace is a 64 y.o. female coming in with complaint of left arm pain. 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 Patient was originally started on gabapentin with minimal benefit. Patient was started on Effexor  And did have increase to 75mg  after last exam. Patient is also taking gabapentin regularly. Patient states patient continues to improve slowly. Patient denies any new symptoms. States that the Effexor does seem to be working. Patient states that her mood is also better.    Past medical history, social, surgical and family history all reviewed in electronic medical record.   Review of Systems: No headache, visual changes, nausea, vomiting, diarrhea, constipation, dizziness, abdominal pain, skin rash, fevers, chills, night sweats, weight loss, swollen lymph nodes, body aches, joint swelling, muscle aches, chest pain, shortness of breath, mood changes.   Objective Blood pressure 138/76, pulse 93, height 5\' 5"  (1.651 m), weight 222 lb (100.699 kg), SpO2 97 %.  General: No apparent distress alert and oriented x3 mood and affect normal, dressed appropriately.  HEENT: Pupils equal, extraocular movements intact  Respiratory: Patient's speak in full sentences and does not appear short of breath  Cardiovascular: No lower extremity edema, non tender, no erythema  Skin: Warm dry intact with no signs of infection or rash on extremities or on axial skeleton.  Abdomen: Soft nontender  Neuro: Cranial nerves II through XII are intact, neurovascularly intact in all extremities with 2+ DTRs and 2+ pulses.  Lymph: No lymphadenopathy of posterior or anterior cervical chain or axillae bilaterally.  Gait normal with good balance and coordination.  MSK:  Non tender with full range of motion and good stability  and symmetric strength and tone of  elbows, wrist, hip, knee and ankles bilaterally.  Shoulder: left Decreased fullness again of the arm .ROM is full in all planes passively. Rotator cuff strength normal throughout. Mild impingement still present Speeds and Yergason's tests normal. No labral pathology noted with negative Obrien's, negative clunk and good stability. Normal scapular function observed. No painful arc and no drop arm sign. No apprehension sign Contralateral shoulder unremarkable Continues mild improvement   Impression and Recommendations:     This case required medical decision making of moderate complexity.

## 2014-11-18 NOTE — Patient Instructions (Addendum)
Good to see you I am sorry about your car.  Ice still and the compression Try CoQ10 100-400mg  daily could help Lets see you again in 3 months and you should be close to pain free.

## 2014-11-18 NOTE — Progress Notes (Signed)
Pre visit review using our clinic review tool, if applicable. No additional management support is needed unless otherwise documented below in the visit note. 

## 2014-12-21 ENCOUNTER — Encounter: Payer: Self-pay | Admitting: Internal Medicine

## 2014-12-23 ENCOUNTER — Telehealth: Payer: Self-pay | Admitting: *Deleted

## 2014-12-23 DIAGNOSIS — E119 Type 2 diabetes mellitus without complications: Secondary | ICD-10-CM

## 2014-12-23 NOTE — Telephone Encounter (Signed)
Please reschedule patient.

## 2014-12-23 NOTE — Telephone Encounter (Signed)
Labs and dx?  

## 2014-12-24 ENCOUNTER — Other Ambulatory Visit: Payer: BC Managed Care – PPO

## 2014-12-24 ENCOUNTER — Ambulatory Visit: Payer: BC Managed Care – PPO | Admitting: Internal Medicine

## 2014-12-25 ENCOUNTER — Ambulatory Visit: Payer: BC Managed Care – PPO | Admitting: Internal Medicine

## 2015-02-07 ENCOUNTER — Encounter: Payer: Self-pay | Admitting: Internal Medicine

## 2015-02-11 LAB — HM DIABETES EYE EXAM

## 2015-02-18 ENCOUNTER — Ambulatory Visit: Payer: BC Managed Care – PPO | Admitting: Family Medicine

## 2015-02-19 ENCOUNTER — Ambulatory Visit: Payer: BC Managed Care – PPO | Admitting: Internal Medicine

## 2015-02-20 ENCOUNTER — Other Ambulatory Visit: Payer: Self-pay | Admitting: Family Medicine

## 2015-02-20 ENCOUNTER — Ambulatory Visit: Payer: Self-pay | Admitting: General Surgery

## 2015-02-20 NOTE — Telephone Encounter (Signed)
Refill done.  

## 2015-03-11 ENCOUNTER — Ambulatory Visit: Payer: BC Managed Care – PPO | Admitting: Internal Medicine

## 2015-03-12 ENCOUNTER — Other Ambulatory Visit (INDEPENDENT_AMBULATORY_CARE_PROVIDER_SITE_OTHER): Payer: BC Managed Care – PPO

## 2015-03-12 ENCOUNTER — Ambulatory Visit: Payer: BC Managed Care – PPO | Admitting: Family Medicine

## 2015-03-12 DIAGNOSIS — E119 Type 2 diabetes mellitus without complications: Secondary | ICD-10-CM | POA: Diagnosis not present

## 2015-03-12 LAB — COMPREHENSIVE METABOLIC PANEL
ALK PHOS: 84 U/L (ref 39–117)
ALT: 16 U/L (ref 0–35)
AST: 17 U/L (ref 0–37)
Albumin: 3.7 g/dL (ref 3.5–5.2)
BUN: 20 mg/dL (ref 6–23)
CO2: 29 mEq/L (ref 19–32)
CREATININE: 0.96 mg/dL (ref 0.40–1.20)
Calcium: 9.2 mg/dL (ref 8.4–10.5)
Chloride: 103 mEq/L (ref 96–112)
GFR: 62.52 mL/min (ref >60.00)
Glucose, Bld: 137 mg/dL — ABNORMAL HIGH (ref 70–99)
Potassium: 3.6 mEq/L (ref 3.5–5.1)
Sodium: 141 mEq/L (ref 135–145)
Total Bilirubin: 0.5 mg/dL (ref 0.2–1.2)
Total Protein: 7 g/dL (ref 6.0–8.3)

## 2015-03-12 LAB — LIPID PANEL
CHOLESTEROL: 178 mg/dL (ref 0–200)
HDL: 38 mg/dL — AB (ref >39.00)
LDL CALC: 105 mg/dL — AB (ref 0–99)
NonHDL: 140
TRIGLYCERIDES: 173 mg/dL — AB (ref 0.0–149.0)
Total CHOL/HDL Ratio: 5
VLDL: 34.6 mg/dL (ref 0.0–40.0)

## 2015-03-12 LAB — HEMOGLOBIN A1C: Hgb A1c MFr Bld: 6.7 % — ABNORMAL HIGH (ref 4.6–6.5)

## 2015-03-13 ENCOUNTER — Encounter: Payer: Self-pay | Admitting: Internal Medicine

## 2015-03-13 ENCOUNTER — Ambulatory Visit (INDEPENDENT_AMBULATORY_CARE_PROVIDER_SITE_OTHER): Payer: BC Managed Care – PPO | Admitting: Internal Medicine

## 2015-03-13 VITALS — BP 140/78 | HR 90 | Temp 98.1°F | Resp 14 | Ht 65.0 in | Wt 216.5 lb

## 2015-03-13 DIAGNOSIS — Z23 Encounter for immunization: Secondary | ICD-10-CM | POA: Diagnosis not present

## 2015-03-13 DIAGNOSIS — E119 Type 2 diabetes mellitus without complications: Secondary | ICD-10-CM

## 2015-03-13 DIAGNOSIS — I152 Hypertension secondary to endocrine disorders: Secondary | ICD-10-CM

## 2015-03-13 DIAGNOSIS — E1169 Type 2 diabetes mellitus with other specified complication: Secondary | ICD-10-CM

## 2015-03-13 DIAGNOSIS — E669 Obesity, unspecified: Secondary | ICD-10-CM

## 2015-03-13 DIAGNOSIS — Z1239 Encounter for other screening for malignant neoplasm of breast: Secondary | ICD-10-CM

## 2015-03-13 DIAGNOSIS — I10 Essential (primary) hypertension: Secondary | ICD-10-CM

## 2015-03-13 DIAGNOSIS — E1159 Type 2 diabetes mellitus with other circulatory complications: Secondary | ICD-10-CM

## 2015-03-13 LAB — MICROALBUMIN / CREATININE URINE RATIO
Creatinine,U: 182.1 mg/dL
MICROALB UR: 0.7 mg/dL (ref 0.0–1.9)
Microalb Creat Ratio: 0.4 mg/g (ref 0.0–30.0)

## 2015-03-13 LAB — HM DIABETES FOOT EXAM: HM DIABETIC FOOT EXAM: NORMAL

## 2015-03-13 NOTE — Progress Notes (Signed)
Pre-visit discussion using our clinic review tool. No additional management support is needed unless otherwise documented below in the visit note.  

## 2015-03-13 NOTE — Patient Instructions (Signed)
You are doing great!  Your diabetes remains under excellent control  And your cholesterol and other labs are also normal. Please continue your current medications. return in 6 months for follow up on diabetes and make sure you are seeing your eye doctor at least once a year.

## 2015-03-13 NOTE — Progress Notes (Signed)
Subjective:  Patient ID: Cheryl Wallace, female    DOB: [birth year]  Age: 64 y.o. MRN: [id number]  CC: The primary encounter diagnosis was Breast cancer screening. Diagnoses of Need for prophylactic vaccination against Streptococcus pneumoniae (pneumococcus), Diabetes mellitus without complication, Obesity, and Hypertension associated with diabetes were also pertinent to this visit.  HPI Cheryl Wallace presents for follow up on DM type 2, hypertension, hyperlipidemia,  And obesity.  She is still  losing weight.  Scratchy throat improving with avoidance of irritantf  BS running < 120 fasting  .  Has been exercising and following  low GI diet.  loinsg weight intentionally.      Seeing Dr Tamala Julian for left arm pain due to [employer].  appt next Thursday still taking gabapentin and effexor and symptoms are improving  Using prilosec generic  for reflux induced cough.  Developed heartburn initially with generic change, but now tolerating it,    Outpatient Prescriptions Prior to Visit  Medication Sig Dispense Refill  . cholecalciferol (VITAMIN D) 1000 UNITS tablet Take 1,000 Units by mouth daily.    [name] gabapentin (NEURONTIN) 400 MG capsule TAKE 1 CAPSULE (400 MG TOTAL) BY MOUTH AT BEDTIME. 30 capsule 3  . losartan (COZAAR) 50 MG tablet Take 1 tablet (50 mg total) by mouth daily. 90 tablet 3  . metFORMIN (GLUCOPHAGE) 500 MG tablet Take 1 tablet (500 mg total) by mouth 2 (two) times daily with a meal. 180 tablet 3  . PRILOSEC OTC 20 MG tablet TAKE 1 TABLET (20 MG TOTAL) BY MOUTH 2 (TWO) TIMES DAILY. 56 tablet 4  . traMADol (ULTRAM) 50 MG tablet Take 1 tablet (50 mg total) by mouth every 8 (eight) hours as needed. 90 tablet 2  . venlafaxine (EFFEXOR) 75 MG tablet TAKE 1 TABLET (75 MG TOTAL) BY MOUTH 2 (TWO) TIMES DAILY WITH A MEAL. 60 tablet 3  . venlafaxine (EFFEXOR) 37.5 MG tablet TAKE 1 TABLET (37.5 MG TOTAL) BY MOUTH 2 (TWO) TIMES DAILY WITH A MEAL. 60 tablet 3   No facility-administered medications prior  to visit.    Review of Systems;  Patient denies headache, fevers, malaise, unintentional weight loss, skin rash, eye pain, sinus congestion and sinus pain, sore throat, dysphagia,  hemoptysis , cough, dyspnea, wheezing, chest pain, palpitations, orthopnea, edema, abdominal pain, nausea, melena, diarrhea, constipation, flank pain, dysuria, hematuria, urinary  Frequency, nocturia, numbness, tingling, seizures,  Focal weakness, Loss of consciousness,  Tremor, insomnia, depression, anxiety, and suicidal ideation.      Objective:  BP 140/78 mmHg  Pulse 90  Temp(Src) 98.1 F (36.7 C) (Oral)  Resp 14  Ht 5\' 5"  (1.651 m)  Wt 216 lb 8 oz (98.204 kg)  BMI 36.03 kg/m2  SpO2 97%  BP Readings from Last 3 Encounters:  03/13/15 140/78  11/18/14 138/76  09/24/14 160/78    Wt Readings from Last 3 Encounters:  03/13/15 216 lb 8 oz (98.204 kg)  11/18/14 222 lb (100.699 kg)  09/24/14 228 lb 12 oz (103.76 kg)    General appearance: alert, cooperative and appears stated age Ears: normal TM's and external ear canals both ears Throat: lips, mucosa, and tongue normal; teeth and gums normal Neck: no adenopathy, no carotid bruit, supple, symmetrical, trachea midline and thyroid not enlarged, symmetric, no tenderness/mass/nodules Back: symmetric, no curvature. ROM normal. No CVA tenderness. Lungs: clear to auscultation bilaterally Heart: regular rate and rhythm, S1, S2 normal, no murmur, click, rub or gallop Abdomen: soft, non-tender; bowel sounds normal; no masses,  no organomegaly Pulses: 2+ and symmetric Skin: Skin color, texture, turgor normal. No rashes or lesions Lymph nodes: Cervical, supraclavicular, and axillary nodes normal.  Lab Results  Component Value Date   HGBA1C 6.7* 03/12/2015   HGBA1C 7.2* 09/23/2014   HGBA1C 7.7* 03/20/2014    Lab Results  Component Value Date   CREATININE 0.96 03/12/2015   CREATININE 0.97 09/23/2014   CREATININE 0.8 03/20/2014    Lab Results    Component Value Date   WBC 10.5 04/15/2013   HGB 12.3 04/15/2013   HCT 37.1 04/15/2013   PLT 248 04/15/2013   GLUCOSE 137* 03/12/2015   CHOL 178 03/12/2015   TRIG 173.0* 03/12/2015   HDL 38.00* 03/12/2015   LDLDIRECT 83.2 12/21/2011   LDLCALC 105* 03/12/2015   ALT 16 03/12/2015   AST 17 03/12/2015   NA 141 03/12/2015   K 3.6 03/12/2015   CL 103 03/12/2015   CREATININE 0.96 03/12/2015   BUN 20 03/12/2015   CO2 29 03/12/2015   TSH 2.44 03/20/2014   HGBA1C 6.7* 03/12/2015   MICROALBUR 0.7 03/12/2015    Mr Humerus Left Wo Contrast  06/25/2014   CLINICAL DATA:  Left lateral upper arm pain for 1 year. Focal swelling.  EXAM: MRI OF THE LEFT HUMERUS WITHOUT CONTRAST  TECHNIQUE: Multiplanar, multisequence MR imaging was performed. No intravenous contrast was administered.  COMPARISON:  None.  FINDINGS: Imaging was performed through the proximal 17 cm of the left humerus. Please note that the standard orthopedic shoulder protocol was not performed, but rather a larger field of view humerus protocol. Accordingly this should not be considered a reliable assessment of the glenohumeral joint, glenoid labrum, and other small structures associated with the shoulder.  Infiltrative abnormal edema observed in the lateral deltoid muscle on images 6 through 27 of series 5. This overlies the regions denoting the palpable lump. The appearance is not currently masslike and there no compelling findings of overt hematoma, but this may represent a muscle tear or strain of the lateral deltoid, or less likely an infiltrative process in the muscle. An overlying subcutaneous lipoma in the surrounding adipose tissues is not readily excluded. There is no abnormal subcutaneous nodularity.  There is at least partial tearing in the rotator cuff, likely in the anterior portion of the infraspinatus tendon on image 12 of series 7. Small amount of fluid is present in the subacromial subdeltoid bursa. 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  No discrete biceps or triceps muscular edema. Supraspinatus tendinopathy observed.  No regional neurovascular impingement is observed. No marrow edema in the proximal humerus itself.  IMPRESSION: 1. The dominant finding corresponding to the patient's symptoms is an abnormal region of increased T2 signal in the lateral deltoid, fairly indistinctly marginated. This resembles a muscle strain or tear. However, given the chronicity of the patient's symptoms, the possibility of some type of chronic myopathy such as inclusion body myopathy is raised. 2. I do not observe a fatty mass, although occasionally subcutaneous lipoma can be obscured by surrounding adipose tissues. No worrisome mass like findings. 3. An solitary findings include degenerative AC joint arthropathy, at least partial tearing of the infraspinatus tendon, and supraspinatus tendinopathy.   Electronically Signed   By: Sherryl Barters M.D.   On: 06/25/2014 08:33    Assessment & Plan:   Problem List Items Addressed This Visit      Unprioritized   Hypertension associated with diabetes    Uncontrolled since stopping lisinopril,  Starting losartan.  Lab Results  Component Value Date   CREATININE 0.96 03/12/2015   Lab Results  Component Value Date   NA 141 03/12/2015   K 3.6 03/12/2015   CL 103 03/12/2015   CO2 29 03/12/2015             Obesity    I have congratulated her in reduction of   BMI and encouraged  Continued weight loss with goal of 10% of body weigh over the next 6 months using a low glycemic index diet and regular exercise a minimum of 5 days per week.        Diabetes mellitus without complication    Improving control with low GI diet and exercise .  hemoglobin A1c is now < 7.0 . Patient is referred for  eye exam and foot exam was done today.  There is  no proteinuria on prior micro urinalysis .  Fasting lipids will be repeated  and statin therapy advised if indicated  by application of new ACC guidelines based on patient's 10 year risk of CAD   Lab Results  Component Value Date   HGBA1C 6.7* 03/12/2015   Lab Results  Component Value Date   MICROALBUR 0.7 03/12/2015          Other Visit Diagnoses    Breast cancer screening    -  Primary    Relevant Orders    MM DIGITAL SCREENING BILATERAL    Need for prophylactic vaccination against Streptococcus pneumoniae (pneumococcus)        Relevant Orders    Pneumococcal polysaccharide vaccine 23-valent greater than or equal to 2yo subcutaneous/IM (Completed)       I am having Cheryl Wallace maintain her cholecalciferol, metFORMIN, losartan, traMADol, gabapentin, PRILOSEC OTC, and venlafaxine.  No orders of the defined types were placed in this encounter.    Medications Discontinued During This Encounter  Medication Reason  . venlafaxine (EFFEXOR) 37.5 MG tablet Change in therapy    Follow-up: Return in about 3 months (around 06/13/2015).   Crecencio Mc, MD

## 2015-03-15 ENCOUNTER — Other Ambulatory Visit: Payer: Self-pay | Admitting: Family Medicine

## 2015-03-15 NOTE — Assessment & Plan Note (Signed)
Uncontrolled since stopping lisinopril,  Starting losartan.  Lab Results  Component Value Date   CREATININE 0.96 03/12/2015   Lab Results  Component Value Date   NA 141 03/12/2015   K 3.6 03/12/2015   CL 103 03/12/2015   CO2 29 03/12/2015

## 2015-03-15 NOTE — Assessment & Plan Note (Signed)
I have congratulated her in reduction of   BMI and encouraged  Continued weight loss with goal of 10% of body weigh over the next 6 months using a low glycemic index diet and regular exercise a minimum of 5 days per week.    

## 2015-03-15 NOTE — Assessment & Plan Note (Signed)
Improving control with low GI diet and exercise .  hemoglobin A1c is now < 7.0 . Patient is referred for  eye exam and foot exam was done today.  There is  no proteinuria on prior micro urinalysis .  Fasting lipids will be repeated  and statin therapy advised if indicated by application of new ACC guidelines based on patient's 10 year risk of CAD   Lab Results  Component Value Date   HGBA1C 6.7* 03/12/2015   Lab Results  Component Value Date   MICROALBUR 0.7 03/12/2015

## 2015-03-17 ENCOUNTER — Encounter: Payer: Self-pay | Admitting: *Deleted

## 2015-03-17 NOTE — Telephone Encounter (Signed)
Refill done.  

## 2015-03-18 ENCOUNTER — Other Ambulatory Visit: Payer: Self-pay | Admitting: Family Medicine

## 2015-03-18 NOTE — Telephone Encounter (Signed)
Refill done.  

## 2015-03-20 ENCOUNTER — Ambulatory Visit (INDEPENDENT_AMBULATORY_CARE_PROVIDER_SITE_OTHER): Payer: BC Managed Care – PPO | Admitting: Family Medicine

## 2015-03-20 ENCOUNTER — Encounter: Payer: Self-pay | Admitting: Family Medicine

## 2015-03-20 VITALS — BP 134/72 | HR 89 | Ht 65.0 in | Wt 221.0 lb

## 2015-03-20 DIAGNOSIS — G7241 Inclusion body myositis [IBM]: Secondary | ICD-10-CM

## 2015-03-20 MED ORDER — BACLOFEN 10 MG PO TABS: 10.0000 mg | ORAL_TABLET | Freq: Every evening | ORAL | Status: DC | PRN

## 2015-03-20 NOTE — Patient Instructions (Signed)
Good to see you Continue what you are doing  Lets continue the effexor if pain resolves before I see you can go to 3 times a week for 2 weeks then discontinue if you want.  Baclofen at night See me again in 8 weeks.

## 2015-03-20 NOTE — Assessment & Plan Note (Signed)
Patient did have this happen after a blood pressure cuff. Patient now is approximately 15 months out from injury. Continues to improve. Patient will continue the gabapentin at night and patient was also given a new muscle relaxer to try. Patient will continue with the Effexor and we discussed increasing the dose but patient states that she would like to stay at the same. Patient will come back and see me again in 2 months. Hopefully patient will be near completely resolved at that time. Discussed again with patient that 2 years seems to be the limit for the pathology of this problem.

## 2015-03-20 NOTE — Progress Notes (Signed)
Pre visit review using our clinic review tool, if applicable. No additional management support is needed unless otherwise documented below in the visit note. 

## 2015-03-20 NOTE — Progress Notes (Signed)
  [facility] [address] [place], [place] [address] Phone: [phone] Subjective:    CC: Left arm pain for followup  [id number] Cheryl Wallace is a 64 y.o. female coming in with complaint of left arm pain. 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 Patient was originally started on gabapentin with minimal benefit.  Patient was started on Effexor  And did have increase to 75mg  after last exam. Patient is also taking gabapentin regularly. Patient states patient continues to improve slowly. Patient denies any new symptoms. States that the Effexor does seem to be working. Patient states that her mood is also better. Patient states that the pain is minimal at this time. States that she is 90% better. Only hurts her when she is trying to lift something fairly heavy. Once again denies that it is anything to do with her shoulder. States that she is sleeping fairly comfortably but can wake her up at night.    Past medical history, social, surgical and family history all reviewed in electronic medical record.   Review of Systems: No headache, visual changes, nausea, vomiting, diarrhea, constipation, dizziness, abdominal pain, skin rash, fevers, chills, night sweats, weight loss, swollen lymph nodes, body aches, joint swelling, muscle aches, chest pain, shortness of breath, mood changes.   Objective Blood pressure 134/72, pulse 89, height 5\' 5"  (1.651 m), weight 221 lb (100.245 kg), SpO2 98 %.  General: No apparent distress alert and oriented x3 mood and affect normal, dressed appropriately.  HEENT: Pupils equal, extraocular movements intact  Respiratory: Patient's speak in full sentences and does not appear short of breath  Cardiovascular: No lower extremity edema, non tender, no erythema  Skin: Warm dry intact with no signs of infection or rash on extremities or on axial skeleton.  Abdomen: Soft nontender  Neuro: Cranial nerves II through  XII are intact, neurovascularly intact in all extremities with 2+ DTRs and 2+ pulses.  Lymph: No lymphadenopathy of posterior or anterior cervical chain or axillae bilaterally.  Gait normal with good balance and coordination.  MSK:  Non tender with full range of motion and good stability and symmetric strength and tone of  elbows, wrist, hip, knee and ankles bilaterally.  Shoulder: left Continue decreasing fullness of the arm on the lateral aspect still tender though to direct palpation .ROM is full in all planes passively. Rotator cuff strength normal throughout. Negative impingement Speeds and Yergason's tests normal. No labral pathology noted with negative Obrien's, negative clunk and good stability. Normal scapular function observed. No painful arc and no drop arm sign. No apprehension sign Contralateral shoulder unremarkable Continues mild improvement   Impression and Recommendations:     This case required medical decision making of moderate complexity.

## 2015-03-22 ENCOUNTER — Other Ambulatory Visit: Payer: Self-pay | Admitting: Internal Medicine

## 2015-04-08 ENCOUNTER — Encounter: Payer: Self-pay | Admitting: *Deleted

## 2015-05-09 ENCOUNTER — Other Ambulatory Visit: Payer: Self-pay | Admitting: Internal Medicine

## 2015-05-15 ENCOUNTER — Ambulatory Visit (INDEPENDENT_AMBULATORY_CARE_PROVIDER_SITE_OTHER): Payer: BC Managed Care – PPO | Admitting: Family Medicine

## 2015-05-15 ENCOUNTER — Encounter: Payer: Self-pay | Admitting: Family Medicine

## 2015-05-15 VITALS — BP 132/76 | HR 90 | Wt 223.0 lb

## 2015-05-15 DIAGNOSIS — G7241 Inclusion body myositis [IBM]: Secondary | ICD-10-CM

## 2015-05-15 NOTE — Progress Notes (Signed)
  [name] [address] [place], [place] [address] Phone: [phone] Subjective:    CC: Left arm pain for followup  [id number] Cheryl Wallace is a 64 y.o. female coming in with complaint of left arm pain. 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 Patient was originally started on gabapentin with minimal benefit.  Patient was started on Effexor  And did have increase to 75mg  still.  Patient states that she is 95% better. Not having any significant pain. States that the Effexor helped out significantly.    Past medical history, social, surgical and family history all reviewed in electronic medical record.   Review of Systems: No headache, visual changes, nausea, vomiting, diarrhea, constipation, dizziness, abdominal pain, skin rash, fevers, chills, night sweats, weight loss, swollen lymph nodes, body aches, joint swelling, muscle aches, chest pain, shortness of breath, mood changes.   Objective Blood pressure 132/76, pulse 90, weight 223 lb (101.152 kg), SpO2 97 %.  General: No apparent distress alert and oriented x3 mood and affect normal, dressed appropriately.  HEENT: Pupils equal, extraocular movements intact  Respiratory: Patient's speak in full sentences and does not appear short of breath  Cardiovascular: No lower extremity edema, non tender, no erythema  Skin: Warm dry intact with no signs of infection or rash on extremities or on axial skeleton.  Abdomen: Soft nontender  Neuro: Cranial nerves II through XII are intact, neurovascularly intact in all extremities with 2+ DTRs and 2+ pulses.  Lymph: No lymphadenopathy of posterior or anterior cervical chain or axillae bilaterally.  Gait normal with good balance and coordination.  MSK:  Non tender with full range of motion and good stability and symmetric strength and tone of  elbows, wrist, hip, knee and ankles bilaterally.  Shoulder: left No fullness and looks like the  contralateral side which is the first time and multiple months. .ROM is full in all planes passively. Rotator cuff strength normal throughout. Negative impingement Speeds and Yergason's tests normal. No labral pathology noted with negative Obrien's, negative clunk and good stability. Normal scapular function observed. No painful arc and no drop arm sign. No apprehension sign Contralateral shoulder unremarkable Significant improvement from previous exam   Impression and Recommendations:     This case required medical decision making of moderate complexity.

## 2015-05-15 NOTE — Assessment & Plan Note (Signed)
Patient is doing much better. We are normal sterile months from previous injury. I do think that she will do very well overall. We discussed possibly getting around the Effexor but she isn't doing the feeling and is not ready to titrate down at this time. His lungs patient does well she can follow-up as needed. Discussed continuing the Effexor we will need at least annual visits.

## 2015-05-15 NOTE — Patient Instructions (Signed)
Verbal instructions given

## 2015-06-17 ENCOUNTER — Encounter: Payer: BC Managed Care – PPO | Admitting: Internal Medicine

## 2015-07-22 ENCOUNTER — Other Ambulatory Visit: Payer: Self-pay | Admitting: Family Medicine

## 2015-07-22 NOTE — Telephone Encounter (Signed)
Refill done.  

## 2015-08-18 ENCOUNTER — Other Ambulatory Visit: Payer: Self-pay | Admitting: Family Medicine

## 2015-08-18 NOTE — Telephone Encounter (Signed)
Refill done.  

## 2015-09-02 ENCOUNTER — Encounter (INDEPENDENT_AMBULATORY_CARE_PROVIDER_SITE_OTHER): Payer: Self-pay

## 2015-09-10 ENCOUNTER — Encounter
Admission: RE | Admit: 2015-09-10 | Discharge: 2015-09-10 | Disposition: A | Discharge status: Home / Self Care | Payer: BC Managed Care – PPO | Source: Ambulatory Visit | Attending: Otolaryngology | Admitting: Otolaryngology

## 2015-09-10 DIAGNOSIS — Z01810 Encounter for preprocedural cardiovascular examination: Secondary | ICD-10-CM | POA: Diagnosis present

## 2015-09-10 DIAGNOSIS — Z01812 Encounter for preprocedural laboratory examination: Secondary | ICD-10-CM | POA: Insufficient documentation

## 2015-09-10 HISTORY — DX: Adverse effect of unspecified anesthetic, initial encounter: T41.45XA

## 2015-09-10 LAB — BASIC METABOLIC PANEL
ANION GAP: 7 (ref 5–15)
BUN: 15 mg/dL (ref 6–20)
CHLORIDE: 107 mmol/L (ref 101–111)
CO2: 23 mmol/L (ref 22–32)
Calcium: 8.8 mg/dL — ABNORMAL LOW (ref 8.9–10.3)
Creatinine, Ser: 1.02 mg/dL — ABNORMAL HIGH (ref 0.44–1.00)
GFR calc non Af Amer: 58 mL/min — ABNORMAL LOW (ref >60)
Glucose, Bld: 151 mg/dL — ABNORMAL HIGH (ref 65–99)
POTASSIUM: 3.9 mmol/L (ref 3.5–5.1)
Sodium: 137 mmol/L (ref 135–145)

## 2015-09-10 LAB — DIFFERENTIAL
BASOS ABS: 0 10*3/uL (ref 0–0.1)
Basophils Relative: 1 %
EOS ABS: 0.2 10*3/uL (ref 0–0.7)
Eosinophils Relative: 2 %
LYMPHS ABS: 2.4 10*3/uL (ref 1.0–3.6)
LYMPHS PCT: 28 %
MONOS PCT: 7 %
Monocytes Absolute: 0.6 10*3/uL (ref 0.2–0.9)
NEUTROS ABS: 5.4 10*3/uL (ref 1.4–6.5)
NEUTROS PCT: 62 %

## 2015-09-10 LAB — CBC
HEMATOCRIT: 33.2 % — AB (ref 35.0–47.0)
HEMOGLOBIN: 10.3 g/dL — AB (ref 12.0–16.0)
MCH: 22.3 pg — ABNORMAL LOW (ref 26.0–34.0)
MCHC: 30.9 g/dL — AB (ref 32.0–36.0)
MCV: 72.1 fL — ABNORMAL LOW (ref 80.0–100.0)
Platelets: 287 10*3/uL (ref 150–440)
RBC: 4.61 MIL/uL (ref 3.80–5.20)
RDW: 17 % — ABNORMAL HIGH (ref 11.5–14.5)
WBC: 8.6 10*3/uL (ref 3.6–11.0)

## 2015-09-10 NOTE — Patient Instructions (Signed)
  Your procedure is scheduled on: Thursday Jan. 12, 2017. Report to Same Day Surgery. To find out your arrival time please call [phone] between 1PM - 3PM on Wednesday Jan. 11, 2017.  Remember: Instructions that are not followed completely may result in serious medical risk, up to and including death, or upon the discretion of your surgeon and anesthesiologist your surgery may need to be rescheduled.    _x___ 1. Do not eat food or drink liquids after midnight. No gum chewing or hard candies.     _x___ 2. No Alcohol for 24 hours before or after surgery.   ____ 3. Bring all medications with you on the day of surgery if instructed.    __x__ 4. Notify your doctor if there is any change in your medical condition     (cold, fever, infections).     Do not wear jewelry, make-up, hairpins, clips or nail polish.  Do not wear lotions, powders, or perfumes. You may wear deodorant.  Do not shave 48 hours prior to surgery. Men may shave face and neck.  Do not bring valuables to the hospital.    [facility] is not responsible for any belongings or valuables.               Contacts, dentures or bridgework may not be worn into surgery.  Leave your suitcase in the car. After surgery it may be brought to your room.  For patients admitted to the hospital, discharge time is determined by your treatment team.   Patients discharged the day of surgery will not be allowed to drive home.    Please read over the following fact sheets that you were given:   [facility] Preparing for Surgery  _x___ Take these medicines the morning of surgery with A SIP OF WATER:    1. losartan (COZAAR)  2. PRILOSEC OTC   ____ Fleet Enema (as directed)   ____ Use CHG Soap as directed  ____ Use inhalers on the day of surgery  _x___ Stop metformin 2 days prior to surgery, last dose is Monday September 15, 2015.   ____ Take 1/2 of usual insulin dose the night before surgery and none on the morning of  surgery.   ____  Stop Coumadin/Plavix/aspirin on does not apply.  ____ Stop Anti-inflammatories does not apply.  OK to Tylenol or Tramadol for pain.   _x___ Stop supplements Vitamin D until after surgery.    ____ Bring C-Pap to the hospital.

## 2015-09-11 ENCOUNTER — Encounter: Payer: Self-pay | Admitting: Nurse Practitioner

## 2015-09-11 ENCOUNTER — Ambulatory Visit (INDEPENDENT_AMBULATORY_CARE_PROVIDER_SITE_OTHER): Payer: BC Managed Care – PPO | Admitting: Nurse Practitioner

## 2015-09-11 VITALS — BP 150/95 | HR 84 | Ht 65.0 in | Wt 231.0 lb

## 2015-09-11 DIAGNOSIS — Z01818 Encounter for other preprocedural examination: Secondary | ICD-10-CM

## 2015-09-11 DIAGNOSIS — E119 Type 2 diabetes mellitus without complications: Secondary | ICD-10-CM

## 2015-09-11 DIAGNOSIS — I10 Essential (primary) hypertension: Secondary | ICD-10-CM

## 2015-09-11 NOTE — Patient Instructions (Signed)
Medication Instructions:  Your physician recommends that you continue on your current medications as directed. Please refer to the Current Medication list given to you today.   Labwork: none  Testing/Procedures: none  Follow-Up: Your physician recommends that you schedule a follow-up appointment as needed.    Any Other Special Instructions Will Be Listed Below (If Applicable).     If you need a refill on your cardiac medications before your next appointment, please call your pharmacy.   

## 2015-09-11 NOTE — Progress Notes (Signed)
Cardiology Clinic Note   Patient Name: Cheryl Wallace Date of Encounter: 09/11/2015  Primary Care Provider:  Crecencio Mc, MD Primary Cardiologist:  New   Patient Profile    65 year old female with a prior cardiac history who presents for preoperative evaluation.  Past Medical History    Past Medical History  Diagnosis Date  . Obesity   . Allergic rhinitis   . Tinnitus   . Functional ovarian cysts 2011    a. s/p TAH (1994);  b. s/p oopherectomy (~2011)  . GERD (gastroesophageal reflux disease)   . Complication of anesthesia 1981    While pt was under anesthesia, pt had an asthema attack during BTL.  . Type 2 diabetes mellitus ([place])     a.  Hemoglobin A1c in July 2016, 6.7.  . Essential hypertension   . Kidney stones 2011    a. s/p lithotripsy  . Low grade fever   . Gallstones   . Elbow tendonitis     a. bilat, s/p surgery.   Past Surgical History  Procedure Laterality Date  . Partial hysterectomy  1994    still has ovaries, removed for fibroids  . Oophorectomy  04/2010    bilateral, 7 cm benign tumor left ovary  . Colonoscopy  September 2008    Non-bleeding internal hemorhoids identified.  . Abdominal hysterectomy  1994  . Elbow surgery    . Tubal ligation    . Upper gi endoscopy  2011    Allergies  Allergies  Allergen Reactions  . Lisinopril Cough    cough  . Shellfish Allergy Swelling    Swelling of face, lips and hands. No difficulty breathing.  [name] Penicillins Rash    History of Present Illness    65 year old female with the above context past medical history. She has a history of hypertension and type 2 diabetes mellitus. Over the past year, she's been dealing with chronic sinus issues and notes that she has been on antibiotics for some portion of 5 months out of the year. It is now been determined that she needs sinus surgery as well as tonsillectomy. She was recently seen for preoperative evaluation and there was question of abnormality on ECG,  specifically delayed R-wave progression. As result, she was referred for cardiac clearance. Patient denies any prior history of exertional chest pain, dyspnea, PND, orthopnea, dizziness, syncope, edema, or early satiety. 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 She does have a gym membership but hasn't used it about 6 months as her sinus issues have really slowed her down. Follow-up ECG today shows normal R-wave progression without any acute ST or T changes.  Home Medications    Prior to Admission medications   Medication Sig Start Date End Date Taking? Authorizing Provider  cholecalciferol (VITAMIN D) 1000 UNITS tablet Take 1,000 Units by mouth daily.    Yes Historical Provider, MD  losartan (COZAAR) 50 MG tablet Take 1 tablet (50 mg total) by mouth daily. Patient taking differently: Take 50 mg by mouth daily. In am. 09/24/14  Yes 10/07/14, MD  metFORMIN (GLUCOPHAGE) 500 MG tablet TAKE 1 TABLET (500 MG TOTAL) BY MOUTH 2 (TWO) TIMES DAILY WITH A MEAL. 03/24/15  Yes 04/06/15, MD  PRILOSEC OTC 20 MG tablet TAKE 1 TABLET (20 MG TOTAL) BY MOUTH 2 (TWO) TIMES DAILY. 05/09/15  Yes 22/2/16, MD  traMADol (ULTRAM) 50 MG tablet Take 1 tablet (50 mg total) by mouth every 8 (eight)  hours as needed. 09/24/14  Yes Crecencio Mc, MD    Family History    Family History  Problem Relation Age of Onset  . Cancer Mother     colon  . Tuberculosis Father   . Diabetes Father   . Heart disease Father     MI x 2  . Heart attack Father   . Diabetes Sister     half sister    Social History    Social History   Social History  . Marital Status: Divorced    Spouse Name: N/A  . Number of Children: N/A  . Years of Education: N/A   Occupational History  . Not on file.   Social History Main Topics  . Smoking status: Former Smoker -- 1.50 packs/day for 10 years    Types: Cigarettes    Quit date: 09/05/1984  . Smokeless tobacco: Never Used     Comment: remote, quit  30 years ago  . Alcohol Use: 0.0 oz/week    0 Standard drinks or equivalent per week     Comment: on occasion, martinis maybe twice a year (Christmas & New Years).  . Drug Use: No  . Sexual Activity: Not on file   Other Topics Concern  . Not on file   Social History Narrative   Lives with grandson in [place].  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  Does not routinely exercise.  Always uses seat belts.  Has well water.     Review of Systems    General:  In the setting of sinus issues as well as intermittent sore throat, she has had malaise and congestion associated with low-grade fever, which she says is chronic. She denies night sweats or weight changes.  Cardiovascular:  No chest pain, dyspnea on exertion, edema, orthopnea, palpitations, paroxysmal nocturnal dyspnea. Dermatological: No rash, lesions/masses Respiratory: No cough, dyspnea Urologic: No hematuria, dysuria Abdominal:   No nausea, vomiting, diarrhea, bright red blood per rectum, melena, or hematemesis Neurologic:  No visual changes, wkns, changes in mental status. All other systems reviewed and are otherwise negative except as noted above.  Physical Exam    VS:  BP 150/95 mmHg  Pulse 84  Ht 5\' 5"  (1.651 m)  Wt 231 lb (104.781 kg)  BMI 38.44 kg/m2 , BMI Body mass index is 38.44 kg/(m^2). GEN: Well nourished, well developed, in no acute distress. HEENT: normal. Neck: Supple, no JVD, carotid bruits, or masses. Cardiac: RRR, no murmurs, rubs, or gallops. No clubbing, cyanosis, edema.  Radials/DP/PT 2+ and equal bilaterally.  Respiratory:  Respirations regular and unlabored, clear to auscultation bilaterally. GI: Soft, nontender, nondistended, BS + x 4. MS: no deformity or atrophy. Skin: warm and dry, no rash. Neuro:  Strength and sensation are intact. Psych: Normal affect.  Accessory Clinical Findings    ECG - regular sinus rhythm, 84, leftward axis, no acute ST or T changes.  Assessment & Plan   1.   Preoperative cardiovascular examination: Patient is pending sinus and tonsil surgery next week. She had preoperative evaluation on January 4 with question of abnormality on her ECG. Specifically, she had a delay in R-wave progression however repeat ECG today shows normal R-wave progression without ST or T changes. I suspect changes on the January 4 ECG were related to lead placement. In the absence of any history of chest pain or dyspnea, I do not feel that she requires any additional cardiac evaluation at this time and may proceed with surgery as scheduled.  2. Essential hypertension: Blood pressure elevated today at 150/95. She says normally she runs in the 120s to 130s. She is on losartan 50 g daily and should continue this throughout the perioperative period.  3. Type 2 diabetes mellitus: Hemoglobin A1c was 6.7 in July. She is on metformin but admits to not always taking it. I stressed the importance of controlling her diabetes.  4. Sinus congestion/allergic rhinitis: Pending surgery next week as above.  5. Morbid obesity: The patient is hopeful to get back to the gym once her sinus issues improve after surgery. I've encouraged her to do so.  6. Disposition: Follow-up when necessary.   Murray Hodgkins, NP 09/11/2015, 6:39 PM

## 2015-09-11 NOTE — Pre-Procedure Instructions (Signed)
NOTIFIED BY BECKY AT DR VAUGHT OFFICE,PATIENT BEING SEEN BY CARDIO DR Del Rey. LEFT MESSAGE AT PCP OFFICE TO DISREGARD REQUEST FOR MEDICAL CLEARANCE

## 2015-09-11 NOTE — Pre-Procedure Instructions (Signed)
AS INSTRUCTED BY DR VANSTAVEREN, EKG CALLED AND FAXED TO DR Derrel Nip. HAD TO LEAVE MESSAGE. ALSO CALLED AND FAXED TO DR Pryor Ochoa AND SPOKE WITH AMANDA.

## 2015-09-12 NOTE — Pre-Procedure Instructions (Signed)
Clearance note under chart review date 09/11/15.

## 2015-09-15 ENCOUNTER — Telehealth: Payer: Self-pay | Admitting: *Deleted

## 2015-09-15 NOTE — Telephone Encounter (Signed)
Labs are ok,  Clearance form was faxed today by [facility]

## 2015-09-15 NOTE — Telephone Encounter (Signed)
Please advise what I need to send to them.  Looks like last labs were from a hospital encounter.

## 2015-09-15 NOTE — Telephone Encounter (Signed)
[facility] has requested patients medical clarence on her labs. Patient will have surgery this week. Please call Olevia Bowens with any questions at [phone] Fax [phone]

## 2015-09-16 ENCOUNTER — Encounter: Payer: BC Managed Care – PPO | Admitting: Internal Medicine

## 2015-09-18 ENCOUNTER — Ambulatory Visit
Admission: RE | Admit: 2015-09-18 | Discharge: 2015-09-18 | Disposition: A | Discharge status: Home / Self Care | Payer: BC Managed Care – PPO | Source: Ambulatory Visit | Attending: Otolaryngology | Admitting: Otolaryngology

## 2015-09-18 ENCOUNTER — Ambulatory Visit: Payer: BC Managed Care – PPO | Admitting: Registered Nurse

## 2015-09-18 ENCOUNTER — Encounter: Payer: Self-pay | Admitting: *Deleted

## 2015-09-18 ENCOUNTER — Encounter
Admission: RE | Disposition: A | Discharge status: Home / Self Care | Payer: Self-pay | Source: Ambulatory Visit | Attending: Otolaryngology

## 2015-09-18 DIAGNOSIS — Z79899 Other long term (current) drug therapy: Secondary | ICD-10-CM | POA: Diagnosis not present

## 2015-09-18 DIAGNOSIS — K219 Gastro-esophageal reflux disease without esophagitis: Secondary | ICD-10-CM | POA: Diagnosis not present

## 2015-09-18 DIAGNOSIS — I10 Essential (primary) hypertension: Secondary | ICD-10-CM | POA: Insufficient documentation

## 2015-09-18 DIAGNOSIS — J3501 Chronic tonsillitis: Secondary | ICD-10-CM | POA: Insufficient documentation

## 2015-09-18 DIAGNOSIS — Z880 Allergy status to penicillin: Secondary | ICD-10-CM | POA: Diagnosis not present

## 2015-09-18 DIAGNOSIS — E119 Type 2 diabetes mellitus without complications: Secondary | ICD-10-CM | POA: Insufficient documentation

## 2015-09-18 DIAGNOSIS — J351 Hypertrophy of tonsils: Secondary | ICD-10-CM | POA: Diagnosis present

## 2015-09-18 DIAGNOSIS — J039 Acute tonsillitis, unspecified: Secondary | ICD-10-CM | POA: Diagnosis present

## 2015-09-18 DIAGNOSIS — J3489 Other specified disorders of nose and nasal sinuses: Secondary | ICD-10-CM | POA: Insufficient documentation

## 2015-09-18 DIAGNOSIS — J329 Chronic sinusitis, unspecified: Secondary | ICD-10-CM | POA: Insufficient documentation

## 2015-09-18 HISTORY — PX: TONSILLECTOMY: SHX5217

## 2015-09-18 HISTORY — PX: NASAL SINUS SURGERY: SHX719

## 2015-09-18 LAB — GLUCOSE, CAPILLARY: Glucose-Capillary: 143 mg/dL — ABNORMAL HIGH (ref 65–99)

## 2015-09-18 SURGERY — SINUS SURGERY, ENDOSCOPIC
Anesthesia: General | Laterality: Right | Wound class: Clean Contaminated

## 2015-09-18 MED ORDER — DEXAMETHASONE SODIUM PHOSPHATE 10 MG/ML IJ SOLN
INTRAMUSCULAR | Status: DC | PRN
  Administered 2015-09-18: 10 mg via INTRAVENOUS

## 2015-09-18 MED ORDER — OXYMETAZOLINE HCL 0.05 % NA SOLN
NASAL | Status: AC
  Filled 2015-09-18: qty 15

## 2015-09-18 MED ORDER — PROMETHAZINE HCL 12.5 MG PO TABS: 12.5000 mg | ORAL_TABLET | Freq: Four times a day (QID) | ORAL | Status: DC | PRN

## 2015-09-18 MED ORDER — ACETAMINOPHEN 10 MG/ML IV SOLN
INTRAVENOUS | Status: DC | PRN
  Administered 2015-09-18: 1000 mg via INTRAVENOUS

## 2015-09-18 MED ORDER — FENTANYL CITRATE (PF) 100 MCG/2ML IJ SOLN
25.0000 ug | INTRAMUSCULAR | Status: DC | PRN
  Administered 2015-09-18 (×4): 25 ug via INTRAVENOUS

## 2015-09-18 MED ORDER — MIDAZOLAM HCL 2 MG/2ML IJ SOLN
INTRAMUSCULAR | Status: DC | PRN
  Administered 2015-09-18: 2 mg via INTRAVENOUS

## 2015-09-18 MED ORDER — ONDANSETRON HCL 4 MG/2ML IJ SOLN: 4.0000 mg | Freq: Once | INTRAMUSCULAR | Status: DC | PRN

## 2015-09-18 MED ORDER — ONDANSETRON HCL 4 MG/2ML IJ SOLN
INTRAMUSCULAR | Status: DC | PRN
  Administered 2015-09-18: 4 mg via INTRAVENOUS

## 2015-09-18 MED ORDER — ACETAMINOPHEN 10 MG/ML IV SOLN
INTRAVENOUS | Status: AC
  Filled 2015-09-18: qty 100

## 2015-09-18 MED ORDER — LIDOCAINE HCL (CARDIAC) 20 MG/ML IV SOLN
INTRAVENOUS | Status: DC | PRN
  Administered 2015-09-18: 100 mg via INTRAVENOUS

## 2015-09-18 MED ORDER — OXYCODONE-ACETAMINOPHEN 5-325 MG/5ML PO SOLN: 5.0000 mL | ORAL | Status: DC | PRN

## 2015-09-18 MED ORDER — ROCURONIUM BROMIDE 100 MG/10ML IV SOLN
INTRAVENOUS | Status: DC | PRN
  Administered 2015-09-18: 40 mg via INTRAVENOUS

## 2015-09-18 MED ORDER — SULFAMETHOXAZOLE-TRIMETHOPRIM 800-160 MG PO TABS: 1.0000 | ORAL_TABLET | Freq: Two times a day (BID) | ORAL | Status: DC

## 2015-09-18 MED ORDER — FENTANYL CITRATE (PF) 100 MCG/2ML IJ SOLN
INTRAMUSCULAR | Status: AC
  Filled 2015-09-18: qty 2

## 2015-09-18 MED ORDER — FENTANYL CITRATE (PF) 100 MCG/2ML IJ SOLN
INTRAMUSCULAR | Status: DC | PRN
  Administered 2015-09-18: 25 ug via INTRAVENOUS
  Administered 2015-09-18: 75 ug via INTRAVENOUS
  Administered 2015-09-18: 50 ug via INTRAVENOUS

## 2015-09-18 MED ORDER — BUPIVACAINE HCL (PF) 0.5 % IJ SOLN
INTRAMUSCULAR | Status: AC
  Filled 2015-09-18: qty 30

## 2015-09-18 MED ORDER — ACETAMINOPHEN 160 MG/5ML PO SOLN
325.0000 mg | ORAL | Status: DC | PRN
  Administered 2015-09-18: 325 mg via ORAL
  Filled 2015-09-18 (×2): qty 10.2

## 2015-09-18 MED ORDER — NEOSTIGMINE METHYLSULFATE 10 MG/10ML IV SOLN
INTRAVENOUS | Status: DC | PRN
  Administered 2015-09-18: 3 mg via INTRAVENOUS

## 2015-09-18 MED ORDER — PHENYLEPHRINE HCL 10 MG/ML IJ SOLN
INTRAMUSCULAR | Status: DC | PRN
  Administered 2015-09-18 (×4): 100 ug via INTRAVENOUS

## 2015-09-18 MED ORDER — SODIUM CHLORIDE 0.9 % IV SOLN
INTRAVENOUS | Status: DC
  Administered 2015-09-18 (×2): via INTRAVENOUS

## 2015-09-18 MED ORDER — LIDOCAINE HCL 2 % EX GEL
CUTANEOUS | Status: DC | PRN
  Administered 2015-09-18: 1 via TOPICAL

## 2015-09-18 MED ORDER — PROPOFOL 10 MG/ML IV BOLUS
INTRAVENOUS | Status: DC | PRN
  Administered 2015-09-18: 50 mg via INTRAVENOUS
  Administered 2015-09-18: 150 mg via INTRAVENOUS

## 2015-09-18 MED ORDER — OXYCODONE HCL 5 MG/5ML PO SOLN
5.0000 mg | ORAL | Status: DC | PRN
  Administered 2015-09-18: 5 mg via ORAL
  Filled 2015-09-18 (×2): qty 5

## 2015-09-18 MED ORDER — LIDOCAINE-EPINEPHRINE 1 %-1:100000 IJ SOLN
INTRAMUSCULAR | Status: AC
  Filled 2015-09-18: qty 1

## 2015-09-18 MED ORDER — OXYMETAZOLINE HCL 0.05 % NA SOLN
NASAL | Status: DC | PRN
  Administered 2015-09-18: 1

## 2015-09-18 MED ORDER — PREDNISONE 10 MG (21) PO TBPK: ORAL_TABLET | ORAL | Status: DC

## 2015-09-18 MED ORDER — BACITRACIN ZINC 500 UNIT/GM EX OINT
TOPICAL_OINTMENT | CUTANEOUS | Status: AC
  Filled 2015-09-18: qty 28.35

## 2015-09-18 MED ORDER — BUPIVACAINE HCL 0.5 % IJ SOLN
INTRAMUSCULAR | Status: DC | PRN
  Administered 2015-09-18: 1 mL

## 2015-09-18 MED ORDER — GLYCOPYRROLATE 0.2 MG/ML IJ SOLN
INTRAMUSCULAR | Status: DC | PRN
  Administered 2015-09-18: 0.4 mg via INTRAVENOUS
  Administered 2015-09-18: 0.2 mg via INTRAVENOUS

## 2015-09-18 MED ORDER — LIDOCAINE-EPINEPHRINE 1 %-1:100000 IJ SOLN
INTRAMUSCULAR | Status: DC | PRN
  Administered 2015-09-18: 5 mL

## 2015-09-18 MED ORDER — IPRATROPIUM-ALBUTEROL 20-100 MCG/ACT IN AERS
INHALATION_SPRAY | RESPIRATORY_TRACT | Status: DC | PRN
Start: 2015-09-18 — End: 2015-09-18
  Administered 2015-09-18: 4 via RESPIRATORY_TRACT

## 2015-09-18 SURGICAL SUPPLY — 43 items
BATTERY INSTRU NAVIGATION (MISCELLANEOUS) ×15 IMPLANT
BLADE BOVIE TIP EXT 4 (BLADE) ×5 IMPLANT
CANISTER SUC SOCK COL 7IN (MISCELLANEOUS) IMPLANT
CANISTER SUCT 1200ML W/VALVE (MISCELLANEOUS) ×5 IMPLANT
CANISTER SUCT 3000ML (MISCELLANEOUS) ×5 IMPLANT
CATH ROBINSON RED A/P 10FR (CATHETERS) IMPLANT
CATH ROBINSON RED A/P 14FR (CATHETERS) ×5 IMPLANT
CNTNR SPEC 2.5X3XGRAD LEK (MISCELLANEOUS) ×8
COAG SUCT 10F 3.5MM HAND CTRL (MISCELLANEOUS) ×5 IMPLANT
CONT SPEC 4OZ STER OR WHT (MISCELLANEOUS) ×2
CONTAINER SPEC 2.5X3XGRAD LEK (MISCELLANEOUS) ×8 IMPLANT
DRESSING NASL FOAM PST OP SINU (MISCELLANEOUS) ×4 IMPLANT
DRSG NASAL 4CM NASOPORE (MISCELLANEOUS) ×5 IMPLANT
DRSG NASAL FOAM POST OP SINU (MISCELLANEOUS) ×5
GAUZE PACK 2X3YD (MISCELLANEOUS) ×5 IMPLANT
GLOVE BIO SURGEON STRL SZ7.5 (GLOVE) ×10 IMPLANT
GOWN STRL REUS W/ TWL LRG LVL3 (GOWN DISPOSABLE) ×8 IMPLANT
GOWN STRL REUS W/TWL LRG LVL3 (GOWN DISPOSABLE) ×2
HANDLE SUCTION POOLE (INSTRUMENTS) ×4 IMPLANT
IRRIGATOR 4MM STR (IRRIGATION / IRRIGATOR) ×5 IMPLANT
IV NS 1000ML (IV SOLUTION) ×1
IV NS 1000ML BAXH (IV SOLUTION) ×4 IMPLANT
KIT RM TURNOVER STRD PROC AR (KITS) ×5 IMPLANT
NAVIGATION MASK REG  ST (MISCELLANEOUS) ×5 IMPLANT
NS IRRIG 500ML POUR BTL (IV SOLUTION) ×5 IMPLANT
PACK HEAD/NECK (MISCELLANEOUS) ×5 IMPLANT
PACKING NASAL EPIS 4X2.4 XEROG (MISCELLANEOUS) ×5 IMPLANT
PAD GROUND ADULT SPLIT (MISCELLANEOUS) ×5 IMPLANT
PATTIES SURGICAL .5 X3 (DISPOSABLE) ×10 IMPLANT
SET HANDPIECE IRR DIEGO (MISCELLANEOUS) ×5 IMPLANT
SINUPLASTY SPHENOID GUIDE (MISCELLANEOUS) ×5 IMPLANT
SOL ANTI-FOG 6CC FOG-OUT (MISCELLANEOUS) ×4 IMPLANT
SOL FOG-OUT ANTI-FOG 6CC (MISCELLANEOUS) ×1
SPLINT NASAL REUTER (MISCELLANEOUS) ×5 IMPLANT
SUCTION POOLE HANDLE (INSTRUMENTS) ×5
SUT VIC AB 4-0 RB1 18 (SUTURE) ×5 IMPLANT
SWAB CULTURE AMIES ANAERIB BLU (MISCELLANEOUS) IMPLANT
SYR 30ML LL (SYRINGE) ×5 IMPLANT
SYR 3ML LL SCALE MARK (SYRINGE) ×5 IMPLANT
SYSTEM BALLN SINUPLASTY 6X16 (BALLOONS) ×5 IMPLANT
TRAP SPECIMEN MUCOUS 40CC (MISCELLANEOUS) ×5 IMPLANT
TUBING CONNECTING 10 (TUBING) ×5 IMPLANT
WATER STERILE IRR 1000ML POUR (IV SOLUTION) ×5 IMPLANT

## 2015-09-18 NOTE — Transfer of Care (Signed)
Immediate Anesthesia Transfer of Care Note  Patient: Cheryl Wallace  Procedure(s) Performed: Procedure(s): IMAGE GUIDED SINUS SURGERY, BILATERAL MAXILLARY BALLOON SINUPLASTY, BILATERAL FRONTAL BALLOON SINUPLASTY, RIGHT SPHENOID  SINUPLASTY, RIGHT CONCHABULLOSA RESECTION TONSILLECTOMY (N/A)  Patient Location: PACU  Anesthesia Type:General  Level of Consciousness: sedated  Airway & Oxygen Therapy: Patient Spontanous Breathing and Patient connected to face mask oxygen  Post-op Assessment: Report given to RN and Post -op Vital signs reviewed and stable  Post vital signs: Reviewed and stable  Last Vitals:  Filed Vitals:   09/18/15 0748 09/18/15 1042  BP: 157/76 164/67  Pulse: 92 93  Temp: 37 C 37.3 C  Resp: 16 17    Complications: No apparent anesthesia complications

## 2015-09-18 NOTE — Anesthesia Preprocedure Evaluation (Addendum)
Anesthesia Evaluation  Patient identified by MRN, date of birth, ID band Patient awake    Reviewed: Allergy & Precautions, H&P , NPO status , Patient's Chart, lab work & pertinent test results, reviewed documented beta blocker date and time   History of Anesthesia Complications (+) history of anesthetic complications (bronchospasm under anesthesia)  Airway Mallampati: II  TM Distance: >3 FB Neck ROM: full    Dental  (+) Teeth Intact   Pulmonary neg pulmonary ROS, Current Smoker, former smoker,    Pulmonary exam normal        Cardiovascular hypertension, negative cardio ROS Normal cardiovascular exam Rhythm:regular Rate:Normal     Neuro/Psych  Neuromuscular disease negative neurological ROS  negative psych ROS   GI/Hepatic negative GI ROS, Neg liver ROS,   Endo/Other  negative endocrine ROSdiabetes, Well Controlled, Type 2  Renal/GU Renal diseasenegative Renal ROS  negative genitourinary   Musculoskeletal   Abdominal   Peds  Hematology negative hematology ROS (+)   Anesthesia Other Findings   Reproductive/Obstetrics negative OB ROS                            Anesthesia Physical Anesthesia Plan  ASA: III  Anesthesia Plan: General ETT   Post-op Pain Management:    Induction:   Airway Management Planned:   Additional Equipment:   Intra-op Plan:   Post-operative Plan:   Informed Consent: I have reviewed the patients History and Physical, chart, labs and discussed the procedure including the risks, benefits and alternatives for the proposed anesthesia with the patient or authorized representative who has indicated his/her understanding and acceptance.     Plan Discussed with: CRNA  Anesthesia Plan Comments:         Anesthesia Quick Evaluation

## 2015-09-18 NOTE — Discharge Instructions (Signed)

## 2015-09-18 NOTE — Anesthesia Procedure Notes (Signed)
Procedure Name: Intubation Date/Time: 09/18/2015 8:39 AM Performed by: Doreen Salvage Pre-anesthesia Checklist: Patient identified, Patient being monitored, Timeout performed, Emergency Drugs available and Suction available Patient Re-evaluated:Patient Re-evaluated prior to inductionOxygen Delivery Method: Circle system utilized Preoxygenation: Pre-oxygenation with 100% oxygen Intubation Type: IV induction Ventilation: Mask ventilation without difficulty Laryngoscope Size: Mac and 3 Grade View: Grade I Tube type: Oral Rae Tube size: 7.0 mm Number of attempts: 1 Airway Equipment and Method: Stylet Placement Confirmation: ETT inserted through vocal cords under direct vision,  positive ETCO2 and breath sounds checked- equal and bilateral Secured at: 21 cm Tube secured with: Tape Dental Injury: Teeth and Oropharynx as per pre-operative assessment

## 2015-09-18 NOTE — Op Note (Signed)
..09/18/2015  10:29 AM    Cheryl Wallace  [id number]   Pre-Op Dx:  CHRONIC SINUSITIS,TONSILLAR HYPERTROPHY, Chronic tonsillitis  Post-op Dx: CHRONIC SINUSITIS,TONSILLAR HYPERTROPHY, Chronic tonsillitis  Proc:   1)  Image Guidance Sinus Surgery  2)  Bilateral Balloon Maxillary Sinuplasty  3)  Bilateral Frontal Balloon Sinuplasy  4)  Right Balloon Sphenoid Sinuplasty  5)  Tonsillectomy > 12  6)  Right Concha Bullosa Resection  Surg: Sekai Nayak  Anes:  General Endotracheal  EBL:  100cc;s  Comp:  None  Findings:  Right sided concha bullosa and paradoxical turbinate resected, successful bilateral maxillary, frontal, and right sphenoid balloon dilation, 3+ tonsils with tonsilolithiasis bilaterally  Procedure: After the patient was identified in holding and the history and physical and consent was reviewed, the patient was taken to the operating room and placed in a supine position.  General endotracheal anesthesia was induced in the normal fashion.  A proper time-out was performed.  The Stryker image guidance system was set up and calibrated in the normal fashion with an acceptable error of 0.81mm.       The patient next received preoperative Afrin spray for topical decongestion and vasoconstriction and 1% Xylocaine with 1:100,000 epinephrine, 5 cc's, was infiltrated into the inferior turbinates, septum, and anterior middle turbinates bilaterally.  Several minutes were allowed for this to take effect.  Cottoniod pledgets soaked in Afrin were placed into both nasal cavities and left while the patient was prepped and draped in the standard fashion.  The materials were removed from the nose and observed to be intact and correct in number.  The nose was next inspected with a zero degree endoscope and the middle turbinates were medialized and afrin soaked pledgets were placed lateral to the turbinates for approximately one minute.  At this time, attention was directed to the patient's  left front sinus.  The uncinate process was infractured with a maxillary ostia seeker.  The Acclarent balloon sinuplasty device was next placed behind the uncinate process and the guide wire was threaded into the frontal sinus with proper transillumination.  The sinus tract was then dilated 3X for a patent frontal sinus.  Attention at this time was directed to the patient's right side, where a large parodoxical middle turbinate with concha bullosa was encountered.  This was reduced and partially resected for good access to the patient's frontal and maxillary sinuses.  The balloon diltation of the patient's right frontal was next repeated in an identical fashion for a widely patent frontal sinus outflow tract bilaterally.    At this time attention was directed to the patient's maxillary sinuses. The Acclarant baloown sinuplasty device with maxillary tip was placed posterior to the uncinate process on the patient's left side.  The guide wire was advanced until proper transillumination of the left maxillary occurred.  The maxillary ostia was next dilated 3 times for proper forward fracturing of the uncinate process.n  This was repeated in the identical fashion on the patient's right side.    Attention at this time was directed to the patient's right sphenoid sinus.  On the patient's right side, the middle turbinate was lateralized and the superior turbinate was identified.  The sphenoid os was visualized and the op was sequentially dilated with an acclarent balloon device.  At this time with all sinuses opened, the patient's nasal cavity was examinated and copiously irrigated with sterile saline.  Meticulous hemostasis was continued and all sinuses were examined and noted to be widely patent.  Stamberger sinufoam was  placed into the area of the frontal sinus outflow tracts and xerogel was placed adjacent to the patient's right residual middle turbinate.  At this time, attention was directed to the patient's  tonsils.  At this time, a McIvor mouthgag was inserted into the patient's oral cavity after placement of wettened raytec on the patient's maxillary alveolus.  She was next suspended from the [place] stand without injury to teeth, lips, or gums.  A Bovie electrocautery was used to dissect the patient's right tonsil in a subcapsular plane.  Meticulous hemostasis was achieved with Bovie suction cautery.  At this time, the mouth gag was released from suspension for 1 minute.  Attention now was directed to the patient's left side.  In a similar fashion the curved Alice clamp was attached to the superior pole and this was retracted medially and inferiorly and the tonsil was excised in a subcapsular plane with Bovie electrocautery.  After completion of the second tonsil, meticulous hemostasis was continued.  1 cc of 0.5% Marcaine was injected into the anterior and posterior tonsillar fossa bilaterally.  Following this  The care of patient was returned to anesthesia, awakened, and transferred to recovery in stable condition.  Dispo:  PACU to home  Plan: Soft diet.  Limit exercise and strenuous activity for 2 weeks.  Narcotic analgesia.  Nasal saline irrigation.  Fluid hydration  Recheck my office three weeks.   Saivion Goettel 10:29 AM 09/18/2015

## 2015-09-18 NOTE — H&P (Signed)
..  History and Physical paper copy reviewed and updated date of procedure and will be scanned into system.  

## 2015-09-19 LAB — SURGICAL PATHOLOGY

## 2015-09-21 NOTE — Anesthesia Postprocedure Evaluation (Signed)
Anesthesia Post Note  Patient: Cheryl Wallace  Procedure(s) Performed: Procedure(s) (LRB): IMAGE GUIDED SINUS SURGERY, BILATERAL MAXILLARY BALLOON SINUPLASTY, BILATERAL FRONTAL BALLOON SINUPLASTY, RIGHT SPHENOID  SINUPLASTY, RIGHT CONCHABULLOSA RESECTION TONSILLECTOMY (N/A)  Patient location during evaluation: PACU Anesthesia Type: General Level of consciousness: awake and alert Pain management: pain level controlled Vital Signs Assessment: post-procedure vital signs reviewed and stable Respiratory status: spontaneous breathing, nonlabored ventilation, respiratory function stable and patient connected to nasal cannula oxygen Cardiovascular status: blood pressure returned to baseline and stable Postop Assessment: no signs of nausea or vomiting Anesthetic complications: no    Last Vitals:  Filed Vitals:   09/18/15 1254 09/18/15 1308  BP: 165/65 155/58  Pulse: 82 79  Temp: 37.5 C   Resp: 18     Last Pain:  Filed Vitals:   09/19/15 0817  PainSc: 8                  Molli Barrows

## 2015-09-25 ENCOUNTER — Other Ambulatory Visit: Payer: Self-pay | Admitting: Internal Medicine

## 2015-10-13 ENCOUNTER — Encounter: Payer: Self-pay | Admitting: Internal Medicine

## 2015-10-14 ENCOUNTER — Ambulatory Visit (INDEPENDENT_AMBULATORY_CARE_PROVIDER_SITE_OTHER): Payer: BC Managed Care – PPO | Admitting: Internal Medicine

## 2015-10-14 ENCOUNTER — Encounter: Payer: Self-pay | Admitting: Internal Medicine

## 2015-10-14 VITALS — BP 110/68 | HR 82 | Temp 97.8°F | Resp 12 | Ht 65.0 in | Wt 227.5 lb

## 2015-10-14 DIAGNOSIS — E119 Type 2 diabetes mellitus without complications: Secondary | ICD-10-CM | POA: Diagnosis not present

## 2015-10-14 DIAGNOSIS — E785 Hyperlipidemia, unspecified: Secondary | ICD-10-CM

## 2015-10-14 DIAGNOSIS — E1169 Type 2 diabetes mellitus with other specified complication: Secondary | ICD-10-CM | POA: Diagnosis not present

## 2015-10-14 DIAGNOSIS — Z23 Encounter for immunization: Secondary | ICD-10-CM | POA: Diagnosis not present

## 2015-10-14 DIAGNOSIS — E669 Obesity, unspecified: Secondary | ICD-10-CM

## 2015-10-14 DIAGNOSIS — I10 Essential (primary) hypertension: Secondary | ICD-10-CM | POA: Diagnosis not present

## 2015-10-14 DIAGNOSIS — Z90721 Acquired absence of ovaries, unilateral: Secondary | ICD-10-CM

## 2015-10-14 DIAGNOSIS — Z90710 Acquired absence of both cervix and uterus: Secondary | ICD-10-CM | POA: Diagnosis not present

## 2015-10-14 DIAGNOSIS — Z1159 Encounter for screening for other viral diseases: Secondary | ICD-10-CM

## 2015-10-14 DIAGNOSIS — Z299 Encounter for prophylactic measures, unspecified: Secondary | ICD-10-CM

## 2015-10-14 DIAGNOSIS — G4700 Insomnia, unspecified: Secondary | ICD-10-CM

## 2015-10-14 DIAGNOSIS — Z0000 Encounter for general adult medical examination without abnormal findings: Secondary | ICD-10-CM

## 2015-10-14 LAB — COMPREHENSIVE METABOLIC PANEL
ALBUMIN: 3.7 g/dL (ref 3.5–5.2)
ALT: 19 U/L (ref 0–35)
AST: 18 U/L (ref 0–37)
Alkaline Phosphatase: 80 U/L (ref 39–117)
BILIRUBIN TOTAL: 0.5 mg/dL (ref 0.2–1.2)
BUN: 18 mg/dL (ref 6–23)
CALCIUM: 9.3 mg/dL (ref 8.4–10.5)
CO2: 29 mEq/L (ref 19–32)
CREATININE: 0.97 mg/dL (ref 0.40–1.20)
Chloride: 107 mEq/L (ref 96–112)
GFR: 61.66 mL/min (ref >60.00)
Glucose, Bld: 111 mg/dL — ABNORMAL HIGH (ref 70–99)
Potassium: 4.1 mEq/L (ref 3.5–5.1)
SODIUM: 140 meq/L (ref 135–145)
Total Protein: 7 g/dL (ref 6.0–8.3)

## 2015-10-14 LAB — MICROALBUMIN / CREATININE URINE RATIO
CREATININE, U: 159.9 mg/dL
MICROALB UR: 1.4 mg/dL (ref 0.0–1.9)
Microalb Creat Ratio: 0.9 mg/g (ref 0.0–30.0)

## 2015-10-14 LAB — HEMOGLOBIN A1C: Hgb A1c MFr Bld: 7.2 % — ABNORMAL HIGH (ref 4.6–6.5)

## 2015-10-14 LAB — LDL CHOLESTEROL, DIRECT: Direct LDL: 70 mg/dL

## 2015-10-14 MED ORDER — ZOSTER VACCINE LIVE 19400 UNT/0.65ML ~~LOC~~ SOLR: 0.6500 mL | Freq: Once | SUBCUTANEOUS | Status: DC

## 2015-10-14 MED ORDER — ZOLPIDEM TARTRATE 5 MG PO TABS: 5.0000 mg | ORAL_TABLET | Freq: Every evening | ORAL | Status: DC | PRN

## 2015-10-14 MED ORDER — FAMOTIDINE 20 MG PO TABS: 20.0000 mg | ORAL_TABLET | Freq: Two times a day (BID) | ORAL | Status: DC

## 2015-10-14 NOTE — Progress Notes (Signed)
Pre-visit discussion using our clinic review tool. No additional management support is needed unless otherwise documented below in the visit note.  

## 2015-10-14 NOTE — Patient Instructions (Addendum)
The  diet I discussed with you today is the 10 day Green Smoothie Cleansing /Detox Diet by Linden Dolin . iT  Is available on [place] for around $10.  This is not a low carb or a weight loss diet,  It is fundamentally a "cleansing" low fat diet that eliminates sugar, gluten, caffeine, alcohol and dairy for 10 days .  What you add back after the initial ten days is entirely up to  you!  You can expect to lose 5 to 10 lbs depending on how strict you are.   I found that  drinking 2 smoothies or juices  daily and keeping one chewable meal (but keep it simple, like baked fish and salad, rice or bok choy) kept me satisfied and kept me from straying  .  You snack primarily on fresh  fruit, egg whites and judicious quantities of nuts (small handful) .[name]   It does require some form of a blender,  nutrient extractor (Vita Mix, a electric juicer) ,  Or a Nutribullet Rx.  i have found that using frozen fruits is much more convenient and cost effective. You can even find plenty of organic fruit in the frozen fruit section of [employer].  Just thaw what you need for the following day the night before in the refrigerator (to avoid jamming up your machine)    We discussed my concern about continuing your PPI (Prilosec) in light of the recently published studies suggesting an association with increased risk of dementia and kidney failure.  I advised  you to try using  famotidine 20 mg  twice daily, and I sent  an rx to your pharmacy.    if your reflux symptoms are controlled,  You can Continue the daily h2 blocker. If not,  We will resume a covered PPI    Menopause is a normal process in which your reproductive ability comes to an end. This process happens gradually over a span of months to years, usually between the ages of 66 and 48. Menopause is complete when you have missed 12 consecutive menstrual periods. It is important to talk with your health care provider about some of the most common conditions that affect  postmenopausal women, such as heart disease, cancer, and bone loss (osteoporosis). Adopting a healthy lifestyle and getting preventive care can help to promote your health and wellness. Those actions can also lower your chances of developing some of these common conditions. WHAT SHOULD I KNOW ABOUT MENOPAUSE? During menopause, you may experience a number of symptoms, such as:  Moderate-to-severe hot flashes.  Night sweats.  Decrease in sex drive.  Mood swings.  Headaches.  Tiredness.  Irritability.  Memory problems.  Insomnia. Choosing to treat or not to treat menopausal changes is an individual decision that you make with your health care provider. WHAT SHOULD I KNOW ABOUT HORMONE REPLACEMENT THERAPY AND SUPPLEMENTS? Hormone therapy products are effective for treating symptoms that are associated with menopause, such as hot flashes and night sweats. Hormone replacement carries certain risks, especially as you become older. If you are thinking about using estrogen or estrogen with progestin treatments, discuss the benefits and risks with your health care provider. WHAT SHOULD I KNOW ABOUT HEART DISEASE AND STROKE? Heart disease, heart attack, and stroke become more likely as you age. This may be due, in part, to the hormonal changes that your body experiences during menopause. These can affect how your body processes dietary fats, triglycerides, and cholesterol. Heart attack and stroke are both medical  emergencies. There are many things that you can do to help prevent heart disease and stroke:  Have your blood pressure checked at least every 1-2 years. High blood pressure causes heart disease and increases the risk of stroke.  If you are 36-53 years old, ask your health care provider if you should take aspirin to prevent a heart attack or a stroke.  Do not use any tobacco products, including cigarettes, chewing tobacco, or electronic cigarettes. If you need help quitting, ask your  health care provider.  It is important to eat a healthy diet and maintain a healthy weight.  Be sure to include plenty of vegetables, fruits, low-fat dairy products, and lean protein.  Avoid eating foods that are high in solid fats, added sugars, or salt (sodium).  Get regular exercise. This is one of the most important things that you can do for your health.  Try to exercise for at least 150 minutes each week. The type of exercise that you do should increase your heart rate and make you sweat. This is known as moderate-intensity exercise.  Try to do strengthening exercises at least twice each week. Do these in addition to the moderate-intensity exercise.  Know your numbers.Ask your health care provider to check your cholesterol and your blood glucose. Continue to have your blood tested as directed by your health care provider. WHAT SHOULD I KNOW ABOUT CANCER SCREENING? There are several types of cancer. Take the following steps to reduce your risk and to catch any cancer development as early as possible. Breast Cancer  Practice breast self-awareness.  This means understanding how your breasts normally appear and feel.  It also means doing regular breast self-exams. Let your health care provider know about any changes, no matter how small.  If you are 61 or older, have a clinician do a breast exam (clinical breast exam or CBE) every year. Depending on your age, family history, and medical history, it may be recommended that you also have a yearly breast X-ray (mammogram).  If you have a family history of breast cancer, talk with your health care provider about genetic screening.  If you are at high risk for breast cancer, talk with your health care provider about having an MRI and a mammogram every year.  Breast cancer (BRCA) gene test is recommended for women who have family members with BRCA-related cancers. Results of the assessment will determine the need for genetic counseling  and BRCA1 and for BRCA2 testing. BRCA-related cancers include these types:  Breast. This occurs in males or females.  Ovarian.  Tubal. This may also be called fallopian tube cancer.  Cancer of the abdominal or pelvic lining (peritoneal cancer).  Prostate.  Pancreatic. Cervical, Uterine, and Ovarian Cancer Your health care provider may recommend that you be screened regularly for cancer of the pelvic organs. These include your ovaries, uterus, and vagina. This screening involves a pelvic exam, which includes checking for microscopic changes to the surface of your cervix (Pap test).  For women ages 21-65, health care providers may recommend a pelvic exam and a Pap test every three years. For women ages 36-65, they may recommend the Pap test and pelvic exam, combined with testing for human papilloma virus (HPV), every five years. Some types of HPV increase your risk of cervical cancer. Testing for HPV may also be done on women of any age who have unclear Pap test results.  Other health care providers may not recommend any screening for nonpregnant women who are considered  low risk for pelvic cancer and have no symptoms. Ask your health care provider if a screening pelvic exam is right for you.  If you have had past treatment for cervical cancer or a condition that could lead to cancer, you need Pap tests and screening for cancer for at least 20 years after your treatment. If Pap tests have been discontinued for you, your risk factors (such as having a new sexual partner) need to be reassessed to determine if you should start having screenings again. Some women have medical problems that increase the chance of getting cervical cancer. In these cases, your health care provider may recommend that you have screening and Pap tests more often.  If you have a family history of uterine cancer or ovarian cancer, talk with your health care provider about genetic screening.  If you have vaginal bleeding  after reaching menopause, tell your health care provider.  There are currently no reliable tests available to screen for ovarian cancer. Lung Cancer Lung cancer screening is recommended for adults 64-47 years old who are at high risk for lung cancer because of a history of smoking. A yearly low-dose CT scan of the lungs is recommended if you:  Currently smoke.  Have a history of at least 30 pack-years of smoking and you currently smoke or have quit within the past 15 years. A pack-year is smoking an average of one pack of cigarettes per day for one year. Yearly screening should:  Continue until it has been 15 years since you quit.  Stop if you develop a health problem that would prevent you from having lung cancer treatment. Colorectal Cancer  This type of cancer can be detected and can often be prevented.  Routine colorectal cancer screening usually begins at age 35 and continues through age 35.  If you have risk factors for colon cancer, your health care provider may recommend that you be screened at an earlier age.  If you have a family history of colorectal cancer, talk with your health care provider about genetic screening.  Your health care provider may also recommend using home test kits to check for hidden blood in your stool.  A small camera at the end of a tube can be used to examine your colon directly (sigmoidoscopy or colonoscopy). This is done to check for the earliest forms of colorectal cancer.  Direct examination of the colon should be repeated every 5-10 years until age 45. However, if early forms of precancerous polyps or small growths are found or if you have a family history or genetic risk for colorectal cancer, you may need to be screened more often. Skin Cancer  Check your skin from head to toe regularly.  Monitor any moles. Be sure to tell your health care provider:  About any new moles or changes in moles, especially if there is a change in a mole's shape  or color.  If you have a mole that is larger than the size of a pencil eraser.  If any of your family members has a history of skin cancer, especially at a young age, talk with your health care provider about genetic screening.  Always use sunscreen. Apply sunscreen liberally and repeatedly throughout the day.  Whenever you are outside, protect yourself by wearing long sleeves, pants, a wide-brimmed hat, and sunglasses. WHAT SHOULD I KNOW ABOUT OSTEOPOROSIS? Osteoporosis is a condition in which bone destruction happens more quickly than new bone creation. After menopause, you may be at an increased risk  for osteoporosis. To help prevent osteoporosis or the bone fractures that can happen because of osteoporosis, the following is recommended:  If you are 13-63 years old, get at least 1,000 mg of calcium and at least 600 mg of vitamin D per day.  If you are older than age 60 but younger than age 35, get at least 1,200 mg of calcium and at least 600 mg of vitamin D per day.  If you are older than age 68, get at least 1,200 mg of calcium and at least 800 mg of vitamin D per day. Smoking and excessive alcohol intake increase the risk of osteoporosis. Eat foods that are rich in calcium and vitamin D, and do weight-bearing exercises several times each week as directed by your health care provider. WHAT SHOULD I KNOW ABOUT HOW MENOPAUSE AFFECTS [place]? Depression may occur at any age, but it is more common as you become older. Common symptoms of depression include:  Low or sad mood.  Changes in sleep patterns.  Changes in appetite or eating patterns.  Feeling an overall lack of motivation or enjoyment of activities that you previously enjoyed.  Frequent crying spells. Talk with your health care provider if you think that you are experiencing depression. WHAT SHOULD I KNOW ABOUT IMMUNIZATIONS? It is important that you get and maintain your immunizations. These include:  Tetanus,  diphtheria, and pertussis (Tdap) booster vaccine.  Influenza every year before the flu season begins.  Pneumonia vaccine.  Shingles vaccine. Your health care provider may also recommend other immunizations.   This information is not intended to replace advice given to you by your health care provider. Make sure you discuss any questions you have with your health care provider.   Document Released: 10/15/2005 Document Revised: 09/13/2014 Document Reviewed: 04/25/2014 Elsevier Interactive Patient Education [employer].

## 2015-10-14 NOTE — Progress Notes (Addendum)
Patient ID: Cheryl Wallace, female    DOB: 1950/09/23  Age: 65 y.o. MRN: [id number]  The patient is here for annual Medicare wellness examination and management of other chronic and acute problems.   NO PAP  Per patient request . S/p hysterectony Colonoscopy 2010  Has lost 4 lbs since Jan 5, (tonsillectomy)  The risk factors are reflected in the social history.  The roster of all physicians providing medical care to patient - is listed in the Snapshot section of the chart.  Activities of daily living:  The patient is 100% independent in all ADLs: dressing, toileting, feeding as well as independent mobility  Home safety : The patient has smoke detectors in the home. They wear seatbelts.  There are no firearms at home. There is no violence in the home.   There is no risks for hepatitis, STDs or HIV. There is no   history of blood transfusion. They have no travel history to infectious disease endemic areas of the world.  The patient has seen their dentist in the last six month. They have seen their eye doctor in the last year. They admit to slight hearing difficulty with regard to whispered voices and some television programs.  They have deferred audiologic testing in the last year.  They do not  have excessive sun exposure. Discussed the need for sun protection: hats, long sleeves and use of sunscreen if there is significant sun exposure.   Diet: the importance of a healthy diet is discussed. They do have a healthy diet.  The benefits of regular aerobic exercise were discussed. She walks 4 times per week ,  20 minutes.   Depression screen: there are no signs or vegative symptoms of depression- irritability, change in appetite, anhedonia, sadness/tearfullness.  Cognitive assessment: the patient manages all their financial and personal affairs and is actively engaged. They could relate day,date,year and events; recalled 2/3 objects at 3 minutes; performed clock-face test normally.  The  following portions of the patient's history were reviewed and updated as appropriate: allergies, current medications, past family history, past medical history,  past surgical history, past social history  and problem list.  Visual acuity was not assessed per patient preference since she has regular follow up with her ophthalmologist. Hearing and body mass index were assessed and reviewed.   During the course of the visit the patient was educated and counseled about appropriate screening and preventive services including : fall prevention , diabetes screening, nutrition counseling, colorectal cancer screening, and recommended immunizations.    CC: The primary encounter diagnosis was Encounter for preventive measure. Diagnoses of S/P hysterectomy with oophorectomy, Diabetes mellitus without complication ([place]), Hyperlipidemia associated with type 2 diabetes mellitus ([place]), Need for hepatitis C screening test, Insomnia, Essential hypertension, and Obesity were also pertinent to this visit.  Had a tonsillectomy and sinus surgery by Carloyn Manner since last visit for recurrent sinusitis and tonsillitis  Just returned to work yesterday,  Fatigued.  Last Friday had chili beans caused diarrhea all weekend.  . Resumed soft food,  last night rotisserie chicken ,.  Insomnia, chronic ,  Has tried OTC "midnight"  (melatonin 10 mg ) . Wants to try ambien PRILOSEC OTC NO LONGER PAID FOR BY PHARMACY ,   HAS REFLUX AND COUGH WITHOUT IT.  X 1 WEEK  History Huntyr has a past medical history of Obesity; Allergic rhinitis; Tinnitus; Functional ovarian cysts (2011); GERD (gastroesophageal reflux disease); Type 2 diabetes mellitus ([place]); Essential hypertension; Kidney stones (2011); Low grade  fever; Gallstones; Elbow tendonitis; and Complication of anesthesia (1981).   She has past surgical history that includes Partial hysterectomy (1994); Oophorectomy (04/2010); Colonoscopy (September 2008); Abdominal hysterectomy  (1994); Elbow surgery; Tubal ligation; Upper gi endoscopy (2011); Nasal sinus surgery (09/18/2015); and Tonsillectomy (N/A, 09/18/2015).   Her family history includes Cancer in her mother; Diabetes in her father and sister; Heart attack in her father; Heart disease in her father; Tuberculosis in her father.She reports that she quit smoking about 31 years ago. Her smoking use included Cigarettes. She has a 15 pack-year smoking history. She has never used smokeless tobacco. She reports that she drinks alcohol. She reports that she does not use illicit drugs.  Outpatient Prescriptions Prior to Visit  Medication Sig Dispense Refill  . cholecalciferol (VITAMIN D) 1000 UNITS tablet Take 1,000 Units by mouth daily.     [name] losartan (COZAAR) 50 MG tablet TAKE 1 TABLET (50 MG TOTAL) BY MOUTH DAILY. 90 tablet 1  . metFORMIN (GLUCOPHAGE) 500 MG tablet TAKE 1 TABLET (500 MG TOTAL) BY MOUTH 2 (TWO) TIMES DAILY WITH A MEAL. 180 tablet 3  . oxyCODONE-acetaminophen (ROXICET) 5-325 MG/5ML solution Take 5 mLs by mouth every 4 (four) hours as needed for severe pain. (Patient not taking: Reported on 10/14/2015) 500 mL 0  . predniSONE (STERAPRED UNI-PAK 21 TAB) 10 MG (21) TBPK tablet Use as directed Sterapred DS 6 day taper (Patient not taking: Reported on 10/14/2015) 21 tablet 0  . PRILOSEC OTC 20 MG tablet TAKE 1 TABLET (20 MG TOTAL) BY MOUTH 2 (TWO) TIMES DAILY. (Patient not taking: Reported on 10/14/2015) 56 tablet 4  . promethazine (PHENERGAN) 12.5 MG tablet Take 1 tablet (12.5 mg total) by mouth every 6 (six) hours as needed for nausea or vomiting. (Patient not taking: Reported on 10/14/2015) 30 tablet 0  . sulfamethoxazole-trimethoprim (BACTRIM DS,SEPTRA DS) 800-160 MG tablet Take 1 tablet by mouth 2 (two) times daily. (Patient not taking: Reported on 10/14/2015) 20 tablet 0   No facility-administered medications prior to visit.    Review of Systems   Patient denies headache, fevers, malaise, unintentional weight loss, skin  rash, eye pain, sinus congestion and sinus pain, sore throat, dysphagia,  hemoptysis , cough, dyspnea, wheezing, chest pain, palpitations, orthopnea, edema, abdominal pain, nausea, melena, diarrhea, constipation, flank pain, dysuria, hematuria, urinary  Frequency, nocturia, numbness, tingling, seizures,  Focal weakness, Loss of consciousness,  Tremor, insomnia, depression, anxiety, and suicidal ideation.     Objective:  BP 110/68 mmHg  Pulse 82  Temp(Src) 97.8 F (36.6 C) (Oral)  Resp 12  Ht 5\' 5"  (1.651 m)  Wt 227 lb 8 oz (103.193 kg)  BMI 37.86 kg/m2  SpO2 98%  Physical Exam  General appearance: alert, cooperative and appears stated age Head: Normocephalic, without obvious abnormality, atraumatic Eyes: conjunctivae/corneas clear. PERRL, EOM's intact. Fundi benign. Ears: normal TM's and external ear canals both ears Nose: Nares normal. Septum midline. Mucosa normal. No drainage or sinus tenderness. Throat: lips, mucosa, and tongue normal; teeth and gums normal Neck: no adenopathy, no carotid bruit, no JVD, supple, symmetrical, trachea midline and thyroid not enlarged, symmetric, no tenderness/mass/nodules Lungs: clear to auscultation bilaterally Breasts: PENDULOUS appearance, no masses or tenderness Heart: regular rate and rhythm, S1, S2 normal, no murmur, click, rub or gallop Abdomen: soft, non-tender; bowel sounds normal; no masses,  no organomegaly Extremities: extremities normal, atraumatic, no cyanosis or edema Pulses: 2+ and symmetric Skin: Skin color, texture, turgor normal. No rashes or lesions Neurologic: Alert and oriented X  3, normal strength and tone. Normal symmetric reflexes. Normal coordination and gait.  Foot exam:  Nails are well trimmed,  No callouses,  Sensation intact to microfilament   Assessment & Plan:   Problem List Items Addressed This Visit    Insomnia    Chronic, with no improvement using over-the-counter first generation antihistamines. Reviewed  principles of good sleep hygiene. Although she is a snorer, there is no report of apneic spells , morning headaches or daytime somnolence .  Trial of generic ambien per patient request.  The risks and benefits of hypnotics were discussed with patient today including excessive sedation leading to respiratory depression,  impaired thinking/driving, and addiction.  Patient was advised to avoid concurrent use with alcohol, to use medication only as needed and not to share with others  .       Encounter for preventive measure - Primary    Annual comprehensive preventive exam was done as well as an evaluation and management of chronic conditions .  During the course of the visit the patient was educated and counseled about appropriate screening and preventive services including :  diabetes screening, lipid analysis with projected  10 year  risk for CAD , nutrition counseling, breast, cervical and colorectal cancer screening, and recommended immunizations.  Printed recommendations for health maintenance screenings was given.       Obesity    I have addressed  BMI and recommended wt loss of 10% of body weight over the next 6 months using a low fat, low starch, high protein  fruit/vegetable based Mediterranean diet and 30 minutes of aerobic exercise a minimum of 5 days per week.        Diabetes mellitus without complication (HCC)    Slight loss of control with low GI diet and exercise .  hemoglobin A1c is now 7.2 . Patient is referred for  eye exam and foot exam was done today.  There is  no proteinuria on prior micro urinalysis .  Fasting lipids will be repeated  and statin therapy advised if indicated by application of new ACC guidelines based on patient's 10 year risk of CAD   Lab Results  Component Value Date   HGBA1C 7.2* 10/14/2015   Lab Results  Component Value Date   MICROALBUR 1.4 10/14/2015           Relevant Orders   Hemoglobin A1c (Completed)   Comprehensive metabolic panel  (Completed)   Microalbumin / creatinine urine ratio (Completed)   Essential hypertension    Well controlled on current regimen. Renal function stable, no changes today.  Lab Results  Component Value Date   CREATININE 0.97 10/14/2015   Lab Results  Component Value Date   NA 140 10/14/2015   K 4.1 10/14/2015   CL 107 10/14/2015   CO2 29 10/14/2015         S/P hysterectomy with oophorectomy    Other Visit Diagnoses    Hyperlipidemia associated with type 2 diabetes mellitus ([place])        Relevant Orders    LDL cholesterol, direct (Completed)    Need for hepatitis C screening test        Relevant Orders    Hepatitis C antibody (Completed)       I have discontinued Ms. Lawrence's PRILOSEC OTC, predniSONE, oxyCODONE-acetaminophen, sulfamethoxazole-trimethoprim, and promethazine. I am also having her start on zolpidem, famotidine, and zoster vaccine live (PF). Additionally, I am having her maintain her cholecalciferol, metFORMIN, and losartan.  Meds ordered this encounter  Medications  . zolpidem (AMBIEN) 5 MG tablet    Sig: Take 1 tablet (5 mg total) by mouth at bedtime as needed for sleep.    Dispense:  30 tablet    Refill:  4  . famotidine (PEPCID) 20 MG tablet    Sig: Take 1 tablet (20 mg total) by mouth 2 (two) times daily.    Dispense:  180 tablet    Refill:  0  . zoster vaccine live, PF, (ZOSTAVAX) 09811 UNT/0.65ML injection    Sig: Inject 19,400 Units into the skin once.    Dispense:  1 each    Refill:  0    Medications Discontinued During This Encounter  Medication Reason  . predniSONE (STERAPRED UNI-PAK 21 TAB) 10 MG (21) TBPK tablet Completed Course  . promethazine (PHENERGAN) 12.5 MG tablet Error  . PRILOSEC OTC 20 MG tablet Error  . sulfamethoxazole-trimethoprim (BACTRIM DS,SEPTRA DS) 800-160 MG tablet Completed Course  . oxyCODONE-acetaminophen (ROXICET) 5-325 MG/5ML solution Completed Course    Follow-up: No Follow-up on file.   Crecencio Mc,  MD

## 2015-10-15 LAB — HEPATITIS C ANTIBODY: HCV Ab: NEGATIVE

## 2015-10-15 NOTE — Assessment & Plan Note (Signed)
I have addressed  BMI and recommended wt loss of 10% of body weight over the next 6 months using a low fat, low starch, high protein  fruit/vegetable based Mediterranean diet and 30 minutes of aerobic exercise a minimum of 5 days per week.   

## 2015-10-15 NOTE — Assessment & Plan Note (Signed)
Well controlled on current regimen. Renal function stable, no changes today.  Lab Results  Component Value Date   CREATININE 0.97 10/14/2015   Lab Results  Component Value Date   NA 140 10/14/2015   K 4.1 10/14/2015   CL 107 10/14/2015   CO2 29 10/14/2015

## 2015-10-15 NOTE — Assessment & Plan Note (Signed)
Slight loss of control with low GI diet and exercise .  hemoglobin A1c is now 7.2 . Patient is referred for  eye exam and foot exam was done today.  There is  no proteinuria on prior micro urinalysis .  Fasting lipids will be repeated  and statin therapy advised if indicated by application of new ACC guidelines based on patient's 10 year risk of CAD   Lab Results  Component Value Date   HGBA1C 7.2* 10/14/2015   Lab Results  Component Value Date   MICROALBUR 1.4 10/14/2015

## 2015-10-15 NOTE — Assessment & Plan Note (Signed)
Annual comprehensive preventive exam was done as well as an evaluation and management of chronic conditions .  During the course of the visit the patient was educated and counseled about appropriate screening and preventive services including :  diabetes screening, lipid analysis with projected  10 year  risk for CAD , nutrition counseling, breast, cervical and colorectal cancer screening, and recommended immunizations.  Printed recommendations for health maintenance screenings was given 

## 2015-10-18 ENCOUNTER — Encounter: Payer: Self-pay | Admitting: Internal Medicine

## 2015-10-18 NOTE — Assessment & Plan Note (Signed)
Chronic, with no improvement using over-the-counter first generation antihistamines. Reviewed principles of good sleep hygiene. Although she is a snorer, there is no report of apneic spells , morning headaches or daytime somnolence .  Trial of generic ambien per patient request.  The risks and benefits of hypnotics were discussed with patient today including excessive sedation leading to respiratory depression,  impaired thinking/driving, and addiction.  Patient was advised to avoid concurrent use with alcohol, to use medication only as needed and not to share with others  .

## 2015-10-18 NOTE — Addendum Note (Signed)
Addended by: Crecencio Mc on: 10/18/2015 04:02 PM   Modules accepted: Miquel Dunn

## 2015-10-27 LAB — HM MAMMOGRAPHY

## 2015-11-04 NOTE — Telephone Encounter (Signed)
Mailed unread message to patient.  

## 2015-11-13 ENCOUNTER — Telehealth: Payer: Self-pay | Admitting: Internal Medicine

## 2015-11-13 NOTE — Telephone Encounter (Signed)
PA for Lorrin Mais started completed on cover my meds. Awaiting decision.

## 2015-11-14 NOTE — Telephone Encounter (Signed)
PA for Zolpidem approved from 11/13/2015-11/13/2018.

## 2015-11-14 NOTE — Telephone Encounter (Signed)
Left message for patient to return call to office please advise patient that PA for Lorrin Mais has been approved.

## 2015-11-25 ENCOUNTER — Other Ambulatory Visit: Payer: Self-pay | Admitting: Family Medicine

## 2015-11-25 NOTE — Telephone Encounter (Signed)
Refill done.  

## 2015-12-24 ENCOUNTER — Other Ambulatory Visit: Payer: Self-pay | Admitting: Family Medicine

## 2015-12-24 NOTE — Telephone Encounter (Signed)
Refill done.  

## 2016-01-01 ENCOUNTER — Encounter: Payer: Self-pay | Admitting: Internal Medicine

## 2016-01-06 NOTE — Telephone Encounter (Signed)
Called and  lm for pt to resched appt

## 2016-01-13 ENCOUNTER — Ambulatory Visit: Payer: BC Managed Care – PPO | Admitting: Internal Medicine

## 2016-01-16 ENCOUNTER — Other Ambulatory Visit: Payer: Self-pay | Admitting: Internal Medicine

## 2016-01-23 ENCOUNTER — Encounter: Payer: Self-pay | Admitting: Internal Medicine

## 2016-03-14 ENCOUNTER — Encounter: Payer: Self-pay | Admitting: Internal Medicine

## 2016-03-15 ENCOUNTER — Ambulatory Visit: Payer: BC Managed Care – PPO | Admitting: Internal Medicine

## 2016-03-15 ENCOUNTER — Other Ambulatory Visit: Payer: Self-pay | Admitting: Internal Medicine

## 2016-03-15 ENCOUNTER — Ambulatory Visit (INDEPENDENT_AMBULATORY_CARE_PROVIDER_SITE_OTHER): Payer: BC Managed Care – PPO | Admitting: Family

## 2016-03-15 ENCOUNTER — Encounter: Payer: Self-pay | Admitting: Family

## 2016-03-15 VITALS — BP 138/86 | HR 84 | Temp 98.0°F | Resp 10 | Ht 65.0 in | Wt 235.2 lb

## 2016-03-15 DIAGNOSIS — E1159 Type 2 diabetes mellitus with other circulatory complications: Secondary | ICD-10-CM

## 2016-03-15 DIAGNOSIS — I10 Essential (primary) hypertension: Secondary | ICD-10-CM | POA: Diagnosis not present

## 2016-03-15 DIAGNOSIS — I152 Hypertension secondary to endocrine disorders: Secondary | ICD-10-CM

## 2016-03-15 NOTE — Progress Notes (Signed)
Pre-visit discussion using our clinic review tool. No additional management support is needed unless otherwise documented below in the visit note.  

## 2016-03-15 NOTE — Progress Notes (Signed)
Subjective:    Patient ID: Cheryl Wallace, female    DOB: June 18, 1951, 65 y.o.   MRN: [id number]   Cheryl Wallace is a 65 y.o. female who presents today for an acute visit.    HPI Comments: Patient here for evaluation of cough and hypertension. She stopped losartan one week ago due to non productive cough for months. Cough has stopped and she does not wont to get back on medication.  Denies exertional chest pain or pressure, numbness or tingling radiating to left arm or jaw, palpitations, dizziness, frequent headaches, changes in vision, or shortness of breath.    Past Medical History  Diagnosis Date  . Obesity   . Allergic rhinitis   . Tinnitus   . Functional ovarian cysts 2011    a. s/p TAH (1994);  b. s/p oopherectomy (~2011)  . GERD (gastroesophageal reflux disease)   . Type 2 diabetes mellitus ([place])     a.  Hemoglobin A1c in July 2016, 6.7.  . Essential hypertension   . Kidney stones 2011    a. s/p lithotripsy  . Low grade fever   . Gallstones   . Elbow tendonitis     a. bilat, s/p surgery.  . Complication of anesthesia 1981    While pt was under anesthesia, pt had an asthema attack during BTL.   Allergies: Lisinopril; Shellfish allergy; and Penicillins Current Outpatient Prescriptions on File Prior to Visit  Medication Sig Dispense Refill  . baclofen (LIORESAL) 10 MG tablet TAKE 1 TABLET (10 MG TOTAL) BY MOUTH AT BEDTIME AS NEEDED FOR MUSCLE SPASMS. 30 tablet 0  . cholecalciferol (VITAMIN D) 1000 UNITS tablet Take 1,000 Units by mouth daily.     . famotidine (PEPCID) 20 MG tablet TAKE 1 TABLET (20 MG TOTAL) BY MOUTH 2 (TWO) TIMES DAILY. 180 tablet 0  . metFORMIN (GLUCOPHAGE) 500 MG tablet TAKE 1 TABLET (500 MG TOTAL) BY MOUTH 2 (TWO) TIMES DAILY WITH A MEAL. 180 tablet 3  . zolpidem (AMBIEN) 5 MG tablet Take 1 tablet (5 mg total) by mouth at bedtime as needed for sleep. 30 tablet 4  . zoster vaccine live, PF, (ZOSTAVAX) 57846 UNT/0.65ML injection Inject 19,400 Units into  the skin once. 1 each 0  . losartan (COZAAR) 50 MG tablet TAKE 1 TABLET (50 MG TOTAL) BY MOUTH DAILY. (Patient not taking: Reported on 03/15/2016) 90 tablet 1   No current facility-administered medications on file prior to visit.    Social History  Substance Use Topics  . Smoking status: Former Smoker -- 1.50 packs/day for 10 years    Types: Cigarettes    Quit date: 09/05/1984  . Smokeless tobacco: Never Used     Comment: remote, quit 30 years ago  . Alcohol Use: 0.0 oz/week    0 Standard drinks or equivalent per week     Comment: on occasion, martinis maybe twice a year (Christmas & New Years).    Review of Systems  Constitutional: Negative for fever and chills.  Eyes: Negative for visual disturbance.  Respiratory: Positive for cough. Negative for wheezing and stridor.   Cardiovascular: Negative for chest pain, palpitations and leg swelling.  Gastrointestinal: Negative for nausea and vomiting.      Objective:    BP 138/86 mmHg  Pulse 84  Temp(Src) 98 F (36.7 C) (Oral)  Resp 10  Ht 5\' 5"  (1.651 m)  Wt 235 lb 4 oz (106.709 kg)  BMI 39.15 kg/m2  SpO2 95%   Physical Exam  Constitutional:  She appears well-developed and well-nourished.  Eyes: Conjunctivae are normal.  Cardiovascular: Normal rate, regular rhythm, normal heart sounds and normal pulses.   Pulmonary/Chest: Effort normal and breath sounds normal. She has no wheezes. She has no rhonchi. She has no rales.  Neurological: She is alert.  Skin: Skin is warm and dry.  Psychiatric: She has a normal mood and affect. Her speech is normal and behavior is normal. Thought content normal.  Vitals reviewed.      Assessment & Plan:  1. Hypertension associated with diabetes ([place]) Patient BP < 140/90 and she has been off of losartan over one week. Cough resolved. She is DM with normal renal function. I discussed the renoprotective nature of ARBs/ACE-I and how patient would be loosing that benefit as well as unlikely nature  that ARB would cause cough. Patient prefers to manage BP with lifestyle modifications and f/u with PCP if elevates.      I am having Cheryl Wallace maintain her cholecalciferol, metFORMIN, losartan, zolpidem, zoster vaccine live (PF), baclofen, and famotidine.   No orders of the defined types were placed in this encounter.    Return precautions given.   Start medications as prescribed and explained to patient on After Visit Summary ( AVS). Risks, benefits, and alternatives of the medications and treatment plan prescribed today were discussed, and patient expressed understanding.   Education regarding symptom management and diagnosis given to patient.   Follow-up:Plan follow-up and return precautions given if any worsening symptoms or change in condition.   Continue to follow with Cheryl Mc, MD for routine health maintenance.   Cheryl Wallace and I agreed with plan.   Cheryl Paris, FNP

## 2016-03-15 NOTE — Telephone Encounter (Signed)
Patient has an appointment today. FYI

## 2016-03-15 NOTE — Patient Instructions (Signed)
Trial stop of losartan.   Keep BP log and make f/u appt in 2 weeks to month. Sooner if BP is high.  If there is no improvement in your symptoms, or if there is any worsening of symptoms, or if you have any additional concerns, please return for re-evaluation; or, if we are closed, consider going to the Emergency Room for evaluation if symptoms urgent.   Managing Your High Blood Pressure Blood pressure is a measurement of how forceful your blood is pressing against the walls of the arteries. Arteries are muscular tubes within the circulatory system. Blood pressure does not stay the same. Blood pressure rises when you are active, excited, or nervous; and it lowers during sleep and relaxation. If the numbers measuring your blood pressure stay above normal most of the time, you are at risk for health problems. High blood pressure (hypertension) is a long-term (chronic) condition in which blood pressure is elevated. A blood pressure reading is recorded as two numbers, such as 120 over 80 (or 120/80). The first, higher number is called the systolic pressure. It is a measure of the pressure in your arteries as the heart beats. The second, lower number is called the diastolic pressure. It is a measure of the pressure in your arteries as the heart relaxes between beats.  Keeping your blood pressure in a normal range is important to your overall health and prevention of health problems, such as heart disease and stroke. When your blood pressure is uncontrolled, your heart has to work harder than normal. High blood pressure is a very common condition in adults because blood pressure tends to rise with age. Men and women are equally likely to have hypertension but at different times in life. Before age 72, men are more likely to have hypertension. After 63 years of age, women are more likely to have it. Hypertension is especially common in African Americans. This condition often has no signs or symptoms. The cause of  the condition is usually not known. Your caregiver can help you come up with a plan to keep your blood pressure in a normal, healthy range. BLOOD PRESSURE STAGES Blood pressure is classified into four stages: normal, prehypertension, stage 1, and stage 2. Your blood pressure reading will be used to determine what type of treatment, if any, is necessary. Appropriate treatment options are tied to these four stages:  Normal  Systolic pressure (mm Hg): below 120.  Diastolic pressure (mm Hg): below 80. Prehypertension  Systolic pressure (mm Hg): 120 to 139.  Diastolic pressure (mm Hg): 80 to 89. Stage1  Systolic pressure (mm Hg): 140 to 159.  Diastolic pressure (mm Hg): 90 to 99. Stage2  Systolic pressure (mm Hg): 160 or above.  Diastolic pressure (mm Hg): 100 or above. RISKS RELATED TO HIGH BLOOD PRESSURE Managing your blood pressure is an important responsibility. Uncontrolled high blood pressure can lead to:  A heart attack.  A stroke.  A weakened blood vessel (aneurysm).  Heart failure.  Kidney damage.  Eye damage.  Metabolic syndrome.  Memory and concentration problems. HOW TO MANAGE YOUR BLOOD PRESSURE Blood pressure can be managed effectively with lifestyle changes and medicines (if needed). Your caregiver will help you come up with a plan to bring your blood pressure within a normal range. Your plan should include the following: Education  Read all information provided by your caregivers about how to control blood pressure.  Educate yourself on the latest guidelines and treatment recommendations. New research is always being done  to further define the risks and treatments for high blood pressure. Lifestylechanges  Control your weight.  Avoid smoking.  Stay physically active.  Reduce the amount of salt in your diet.  Reduce stress.  Control any chronic conditions, such as high cholesterol or diabetes.  Reduce your alcohol  intake. Medicines  Several medicines (antihypertensive medicines) are available, if needed, to bring blood pressure within a normal range. Communication  Review all the medicines you take with your caregiver because there may be side effects or interactions.  Talk with your caregiver about your diet, exercise habits, and other lifestyle factors that may be contributing to high blood pressure.  See your caregiver regularly. Your caregiver can help you create and adjust your plan for managing high blood pressure. RECOMMENDATIONS FOR TREATMENT AND FOLLOW-UP  The following recommendations are based on current guidelines for managing high blood pressure in nonpregnant adults. Use these recommendations to identify the proper follow-up period or treatment option based on your blood pressure reading. You can discuss these options with your caregiver.  Systolic pressure of 123456 to [id number] or diastolic pressure of 80 to 89: Follow up with your caregiver as directed.  Systolic pressure of [id number] to 0000000 or diastolic pressure of 90 to 100: Follow up with your caregiver within 2 months.  Systolic pressure above 0000000 or diastolic pressure above [id number]: Follow up with your caregiver within 1 month.  Systolic pressure above [id number] or diastolic pressure above [id number]: Consider antihypertensive therapy; follow up with your caregiver within 1 week.  Systolic pressure above [id number] or diastolic pressure above 123456: Begin antihypertensive therapy; follow up with your caregiver within 1 week.   This information is not intended to replace advice given to you by your health care provider. Make sure you discuss any questions you have with your health care provider.   Document Released: 05/17/2012 Document Reviewed: 05/17/2012 Elsevier Interactive Patient Education [employer].

## 2016-03-16 MED ORDER — FAMOTIDINE 20 MG PO TABS: 20.0000 mg | ORAL_TABLET | Freq: Two times a day (BID) | ORAL | Status: DC

## 2016-03-16 NOTE — Addendum Note (Signed)
Addended by: Nanci Pina on: 03/16/2016 09:57 AM   Modules accepted: Orders

## 2016-03-18 ENCOUNTER — Other Ambulatory Visit: Payer: Self-pay | Admitting: Internal Medicine

## 2016-04-05 NOTE — Telephone Encounter (Signed)
Mailed unread message to patient. thanks 

## 2016-04-19 ENCOUNTER — Ambulatory Visit: Payer: BC Managed Care – PPO | Admitting: Internal Medicine

## 2016-04-21 ENCOUNTER — Other Ambulatory Visit: Payer: Self-pay

## 2016-04-21 ENCOUNTER — Other Ambulatory Visit: Payer: Self-pay | Admitting: Internal Medicine

## 2016-05-12 ENCOUNTER — Ambulatory Visit: Payer: Self-pay | Admitting: Internal Medicine

## 2016-05-12 ENCOUNTER — Ambulatory Visit (INDEPENDENT_AMBULATORY_CARE_PROVIDER_SITE_OTHER): Payer: BC Managed Care – PPO | Admitting: Internal Medicine

## 2016-05-12 ENCOUNTER — Ambulatory Visit (INDEPENDENT_AMBULATORY_CARE_PROVIDER_SITE_OTHER): Payer: BC Managed Care – PPO

## 2016-05-12 ENCOUNTER — Encounter: Payer: Self-pay | Admitting: Internal Medicine

## 2016-05-12 VITALS — BP 160/86 | HR 93 | Temp 98.0°F | Resp 16 | Ht 65.75 in | Wt 244.4 lb

## 2016-05-12 DIAGNOSIS — M2550 Pain in unspecified joint: Secondary | ICD-10-CM | POA: Diagnosis not present

## 2016-05-12 DIAGNOSIS — Z23 Encounter for immunization: Secondary | ICD-10-CM | POA: Diagnosis not present

## 2016-05-12 DIAGNOSIS — J302 Other seasonal allergic rhinitis: Secondary | ICD-10-CM

## 2016-05-12 DIAGNOSIS — E669 Obesity, unspecified: Secondary | ICD-10-CM

## 2016-05-12 DIAGNOSIS — G8929 Other chronic pain: Secondary | ICD-10-CM

## 2016-05-12 DIAGNOSIS — E119 Type 2 diabetes mellitus without complications: Secondary | ICD-10-CM | POA: Diagnosis not present

## 2016-05-12 DIAGNOSIS — I152 Hypertension secondary to endocrine disorders: Secondary | ICD-10-CM

## 2016-05-12 DIAGNOSIS — E1169 Type 2 diabetes mellitus with other specified complication: Secondary | ICD-10-CM

## 2016-05-12 DIAGNOSIS — E785 Hyperlipidemia, unspecified: Secondary | ICD-10-CM

## 2016-05-12 DIAGNOSIS — M79641 Pain in right hand: Secondary | ICD-10-CM

## 2016-05-12 DIAGNOSIS — E1159 Type 2 diabetes mellitus with other circulatory complications: Secondary | ICD-10-CM

## 2016-05-12 DIAGNOSIS — I10 Essential (primary) hypertension: Secondary | ICD-10-CM | POA: Diagnosis not present

## 2016-05-12 MED ORDER — SCOPOLAMINE 1 MG/3DAYS TD PT72: 1.0000 | MEDICATED_PATCH | TRANSDERMAL | 12 refills | Status: DC

## 2016-05-12 MED ORDER — ZOLPIDEM TARTRATE 5 MG PO TABS: 5.0000 mg | ORAL_TABLET | Freq: Every evening | ORAL | 4 refills | Status: DC | PRN

## 2016-05-12 NOTE — Progress Notes (Signed)
sinus spray   Subjective:  Patient ID: Cheryl Wallace, female    DOB: [birth year]  Age: 65 y.o. MRN: [id number]  CC: The primary encounter diagnosis was Chronic hand pain, right. Diagnoses of Essential hypertension, Obesity, diabetes, and hypertension syndrome ([place]), Hypertension associated with diabetes ([place]), Polyarthralgia, Diabetes mellitus without complication ([place]), Hyperlipidemia associated with type 2 diabetes mellitus ([place]), Encounter for immunization, Obesity, and Seasonal allergic rhinitis were also pertinent to this visit.  HPI Cheryl Wallace presents for follow up on Type 2 DM ,  hypertension and obesity   Cc: fatigue,  Feels "washed out"  Sinus drainage and PND for several weeks .  Using  A sinus spray prescrbid after sinus surgery in January , not sure what it is .  Coughing keeping her up at night .[name]  Dry cough.   No fevers,  Some bilateral maxillary sinus pressure.  Lots of sneezing    Lab Results  Component Value Date   HGBA1C 7.6 (H) 05/12/2016   Has gained weight ,BMI now 39.75.  Not checking sugars for the past 2 weeks .  115  Before lunch,   Taking metformin   Having right hand pain 4th 5th fingers in the PIP joints  more recently  Chronic thumb pain. Same hand,  History of fall with  Torn extensor  Carpi radialis ligament that was casted to heal ,  Occurred  Over 17  Years ago.  Had severe pai  When the cast was removed improperly.     Not exercising at the gym since sinuses started bothering her.    Teaches kindergarten through [employer] .  Going on a cruise to [place] go month   Not taking losartan due to cough,  Has not taken it in several weeks.   Dependent on ambien 5 mg for insomnia     Foot exam normal Eye exam scheduled with woodard   Outpatient Medications Prior to Visit  Medication Sig Dispense Refill  . cholecalciferol (VITAMIN D) 1000 UNITS tablet Take 1,000 Units by mouth daily.     . famotidine (PEPCID) 20 MG tablet Take 1 tablet (20 mg  total) by mouth 2 (two) times daily. 180 tablet 4  . metFORMIN (GLUCOPHAGE) 500 MG tablet TAKE 1 TABLET (500 MG TOTAL) BY MOUTH 2 (TWO) TIMES DAILY WITH A MEAL. 180 tablet 3  . zolpidem (AMBIEN) 5 MG tablet Take 1 tablet (5 mg total) by mouth at bedtime as needed for sleep. 30 tablet 4  . baclofen (LIORESAL) 10 MG tablet TAKE 1 TABLET (10 MG TOTAL) BY MOUTH AT BEDTIME AS NEEDED FOR MUSCLE SPASMS. (Patient not taking: Reported on 05/12/2016) 30 tablet 0  . losartan (COZAAR) 50 MG tablet TAKE 1 TABLET (50 MG TOTAL) BY MOUTH DAILY. (Patient not taking: Reported on 05/12/2016) 90 tablet 1  . zoster vaccine live, PF, (ZOSTAVAX) [id number] UNT/0.65ML injection Inject 19,400 Units into the skin once. 1 each 0   No facility-administered medications prior to visit.     Review of Systems;  Patient denies fvers, malaise, unintentional weight loss, skin rash, eye pain,  sore throat, dysphagia,  hemoptysis , dyspnea, wheezing, chest pain, palpitations, orthopnea, edema, abdominal pain, nausea, melena, diarrhea, constipation, flank pain, dysuria, hematuria, urinary  Frequency, nocturia, numbness, tingling, seizures,  Focal weakness, Loss of consciousness,  Tremor,, depression, anxiety, and suicidal ideation.      Objective:  BP (!) 160/86 (BP Location: Right Arm, Patient Position: Sitting, Cuff Size: Large)   Pulse 93  Temp 98 F (36.7 C) (Oral)   Resp 16   Ht 5' 5.75" (1.67 m)   Wt 244 lb 6.4 oz (110.9 kg)   SpO2 96%   BMI 39.75 kg/m   BP Readings from Last 3 Encounters:  05/12/16 (!) 160/86  03/15/16 138/86  10/14/15 110/68    Wt Readings from Last 3 Encounters:  05/12/16 244 lb 6.4 oz (110.9 kg)  03/15/16 235 lb 4 oz (106.7 kg)  10/14/15 227 lb 8 oz (103.2 kg)    General appearance: alert, cooperative and appears stated age Ears: normal TM's and external ear canals both ears Throat: lips, mucosa, and tongue normal; teeth and gums normal Neck: no adenopathy, no carotid bruit, supple,  symmetrical, trachea midline and thyroid not enlarged, symmetric, no tenderness/mass/nodules Back: symmetric, no curvature. ROM normal. No CVA tenderness. Lungs: clear to auscultation bilaterally Heart: regular rate and rhythm, S1, S2 normal, no murmur, click, rub or gallop MSK: no PIP , DIP or MC synovitis either hand  Abdomen: soft, non-tender; bowel sounds normal; no masses,  no organomegaly Pulses: 2+ and symmetric Skin: Skin color, texture, turgor normal. No rashes or lesions Lymph nodes: Cervical, supraclavicular, and axillary nodes normal.  Lab Results  Component Value Date   HGBA1C 7.6 (H) 05/12/2016   HGBA1C 7.2 (H) 10/14/2015   HGBA1C 6.7 (H) 03/12/2015    Lab Results  Component Value Date   CREATININE 0.91 05/12/2016   CREATININE 0.97 10/14/2015   CREATININE 1.02 (H) 09/10/2015    Lab Results  Component Value Date   WBC 8.6 09/10/2015   HGB 10.3 (L) 09/10/2015   HCT 33.2 (L) 09/10/2015   PLT 287 09/10/2015   GLUCOSE 199 (H) 05/12/2016   CHOL 159 05/12/2016   TRIG 201.0 (H) 05/12/2016   HDL 42.40 05/12/2016   LDLDIRECT 93.0 05/12/2016   LDLCALC 105 (H) 03/12/2015   ALT 18 05/12/2016   AST 17 05/12/2016   NA 136 05/12/2016   K 3.9 05/12/2016   CL 105 05/12/2016   CREATININE 0.91 05/12/2016   BUN 25 (H) 05/12/2016   CO2 25 05/12/2016   TSH 2.44 03/20/2014   HGBA1C 7.6 (H) 05/12/2016   MICROALBUR 1.4 10/14/2015    No results found.  Assessment & Plan:   Problem List Items Addressed This Visit    Seasonal allergic rhinitis    Continue Flonase,  Add antihistamine daytime and benadryl at night       Polyarthralgia    ESR and CRP normal.  continue NSAIDs and tylenol.       Relevant Orders   Sedimentation rate (Completed)   C-reactive protein (Completed)   Obesity    I have addressed  BMI and recommended wt loss of 10% of body weigh over the next 6 months using a low glycemic index diet and regular exercise a minimum of 5 days per week.         Diabetes mellitus without complication ([place])    Labs indicate that patient has lost of control of diabetes   Metformin dose increased to 1000 mg bid.    Patient   asked to  check blood sugars twice daily for next 2 weeks:  Fasting and 2 hours after various meals. And bring log by office for review.  3 month follow up advised.   Lab Results  Component Value Date   HGBA1C 7.6 (H) 05/12/2016   Lab Results  Component Value Date   MICROALBUR 1.4 10/14/2015  Essential hypertension    Well controlled on current regimen. Renal function stable, no changes today. Lab Results  Component Value Date   CREATININE 0.91 05/12/2016   Lab Results  Component Value Date   NA 136 05/12/2016   K 3.9 05/12/2016   CL 105 05/12/2016   CO2 25 05/12/2016         Chronic hand pain - Primary    Normal ESR and CRP rule out recurrent of inclusion body myositis and erosive arthritis, as do plain films done today showing degenerative changes       Relevant Orders   DG Hand Complete Right (Completed)   Hypertension associated with diabetes ([place])    Other Visit Diagnoses    Obesity, diabetes, and hypertension syndrome ([place])       Relevant Orders   Hemoglobin A1c (Completed)   Comprehensive metabolic panel (Completed)   Hyperlipidemia associated with type 2 diabetes mellitus ([place])       Relevant Orders   Lipid panel (Completed)   LDL cholesterol, direct (Completed)   Encounter for immunization       Relevant Orders   Flu vaccine HIGH DOSE PF (Completed)     A total of 25 minutes of face to face time was spent with patient more than half of which was spent in counselling about the above mentioned conditions  and coordination of care  I am having Ms. Nyborg start on scopolamine. I am also having her maintain her cholecalciferol, zoster vaccine live (PF), baclofen, famotidine, losartan, metFORMIN, fluticasone, and zolpidem.  Meds ordered this encounter  Medications  . fluticasone (FLONASE)  50 MCG/ACT nasal spray    Sig: Place into the nose.  . zolpidem (AMBIEN) 5 MG tablet    Sig: Take 1 tablet (5 mg total) by mouth at bedtime as needed for sleep.    Dispense:  30 tablet    Refill:  4  . scopolamine (TRANSDERM-SCOP, 1.5 MG,) 1 MG/3DAYS    Sig: Place 1 patch (1.5 mg total) onto the skin every 3 (three) days.    Dispense:  10 patch    Refill:  12    Medications Discontinued During This Encounter  Medication Reason  . zolpidem (AMBIEN) 5 MG tablet Reorder    Follow-up: No Follow-up on file.   Crecencio Mc, MD

## 2016-05-12 NOTE — Patient Instructions (Addendum)
You are suffering from seasonal rhinitis.  Continue Flonase daily ,  Add an antihistamine  nonsedating  for daytime symptoms  and benadryl one hour before bedtime   Here are your choices:   generic zyrtec, which is cetirizine.   Allegra is available generically as fexofenadine and it comes in 60 mg and 180 mg once daily strengths.   The claritin is also available generically as loratidine .   We are starting amlodipne 2.5 mg (low dose ) once daily for blood pressure

## 2016-05-13 LAB — COMPREHENSIVE METABOLIC PANEL
ALT: 18 U/L (ref 0–35)
AST: 17 U/L (ref 0–37)
Albumin: 3.7 g/dL (ref 3.5–5.2)
Alkaline Phosphatase: 83 U/L (ref 39–117)
BUN: 25 mg/dL — ABNORMAL HIGH (ref 6–23)
CALCIUM: 8.7 mg/dL (ref 8.4–10.5)
CHLORIDE: 105 meq/L (ref 96–112)
CO2: 25 mEq/L (ref 19–32)
Creatinine, Ser: 0.91 mg/dL (ref 0.40–1.20)
GFR: 66.25 mL/min (ref >60.00)
GLUCOSE: 199 mg/dL — AB (ref 70–99)
Potassium: 3.9 mEq/L (ref 3.5–5.1)
Sodium: 136 mEq/L (ref 135–145)
Total Bilirubin: 0.3 mg/dL (ref 0.2–1.2)
Total Protein: 6.7 g/dL (ref 6.0–8.3)

## 2016-05-13 LAB — LIPID PANEL
CHOLESTEROL: 159 mg/dL (ref 0–200)
HDL: 42.4 mg/dL (ref >39.00)
NonHDL: 116.12
Total CHOL/HDL Ratio: 4
Triglycerides: 201 mg/dL — ABNORMAL HIGH (ref 0.0–149.0)
VLDL: 40.2 mg/dL — AB (ref 0.0–40.0)

## 2016-05-13 LAB — HEMOGLOBIN A1C: HEMOGLOBIN A1C: 7.6 % — AB (ref 4.6–6.5)

## 2016-05-13 LAB — SEDIMENTATION RATE: Sed Rate: 24 mm/hr (ref 0–30)

## 2016-05-13 LAB — LDL CHOLESTEROL, DIRECT: LDL DIRECT: 93 mg/dL

## 2016-05-13 LAB — C-REACTIVE PROTEIN: CRP: 1.3 mg/dL (ref 0.5–20.0)

## 2016-05-14 ENCOUNTER — Encounter: Payer: Self-pay | Admitting: Internal Medicine

## 2016-05-14 DIAGNOSIS — G8929 Other chronic pain: Secondary | ICD-10-CM | POA: Insufficient documentation

## 2016-05-14 DIAGNOSIS — M19041 Primary osteoarthritis, right hand: Secondary | ICD-10-CM | POA: Insufficient documentation

## 2016-05-14 DIAGNOSIS — M79643 Pain in unspecified hand: Secondary | ICD-10-CM

## 2016-05-14 NOTE — Assessment & Plan Note (Signed)
ESR and CRP normal.  continue NSAIDs and tylenol.

## 2016-05-14 NOTE — Assessment & Plan Note (Signed)
Normal ESR and CRP rule out recurrent of inclusion body myositis and erosive arthritis, as do plain films done today showing degenerative changes

## 2016-05-14 NOTE — Assessment & Plan Note (Signed)
Labs indicate that patient has lost of control of diabetes   Metformin dose increased to 1000 mg bid.    Patient   asked to  check blood sugars twice daily for next 2 weeks:  Fasting and 2 hours after various meals. And bring log by office for review.  3 month follow up advised.   Lab Results  Component Value Date   HGBA1C 7.6 (H) 05/12/2016   Lab Results  Component Value Date   MICROALBUR 1.4 10/14/2015

## 2016-05-14 NOTE — Assessment & Plan Note (Signed)
Continue Flonase,  Add antihistamine daytime and benadryl at night

## 2016-05-14 NOTE — Assessment & Plan Note (Signed)
Well controlled on current regimen. Renal function stable, no changes today. Lab Results  Component Value Date   CREATININE 0.91 05/12/2016   Lab Results  Component Value Date   NA 136 05/12/2016   K 3.9 05/12/2016   CL 105 05/12/2016   CO2 25 05/12/2016

## 2016-05-14 NOTE — Assessment & Plan Note (Signed)
I have addressed  BMI and recommended wt loss of 10% of body weigh over the next 6 months using a low glycemic index diet and regular exercise a minimum of 5 days per week.   

## 2016-05-16 ENCOUNTER — Encounter: Payer: Self-pay | Admitting: Internal Medicine

## 2016-05-18 ENCOUNTER — Encounter: Payer: Self-pay | Admitting: Internal Medicine

## 2016-05-18 MED ORDER — AMLODIPINE BESYLATE 2.5 MG PO TABS: 2.5000 mg | ORAL_TABLET | Freq: Every day | ORAL | 3 refills | Status: DC

## 2016-06-30 ENCOUNTER — Encounter: Payer: Self-pay | Admitting: Internal Medicine

## 2016-07-01 MED ORDER — METFORMIN HCL 500 MG PO TABS: 1000.0000 mg | ORAL_TABLET | Freq: Two times a day (BID) | ORAL | 3 refills | Status: DC

## 2016-08-11 ENCOUNTER — Ambulatory Visit: Payer: BC Managed Care – PPO | Admitting: Internal Medicine

## 2016-09-02 ENCOUNTER — Ambulatory Visit (INDEPENDENT_AMBULATORY_CARE_PROVIDER_SITE_OTHER): Payer: BC Managed Care – PPO | Admitting: Internal Medicine

## 2016-09-02 ENCOUNTER — Encounter: Payer: Self-pay | Admitting: Internal Medicine

## 2016-09-02 VITALS — BP 142/86 | HR 88 | Temp 98.1°F | Resp 16 | Ht 65.0 in | Wt 232.8 lb

## 2016-09-02 DIAGNOSIS — I10 Essential (primary) hypertension: Secondary | ICD-10-CM | POA: Diagnosis not present

## 2016-09-02 DIAGNOSIS — I152 Hypertension secondary to endocrine disorders: Secondary | ICD-10-CM

## 2016-09-02 DIAGNOSIS — IMO0001 Reserved for inherently not codable concepts without codable children: Secondary | ICD-10-CM

## 2016-09-02 DIAGNOSIS — E119 Type 2 diabetes mellitus without complications: Secondary | ICD-10-CM

## 2016-09-02 DIAGNOSIS — E785 Hyperlipidemia, unspecified: Secondary | ICD-10-CM

## 2016-09-02 DIAGNOSIS — R053 Chronic cough: Secondary | ICD-10-CM

## 2016-09-02 DIAGNOSIS — R05 Cough: Secondary | ICD-10-CM

## 2016-09-02 DIAGNOSIS — E1169 Type 2 diabetes mellitus with other specified complication: Secondary | ICD-10-CM | POA: Diagnosis not present

## 2016-09-02 DIAGNOSIS — E1159 Type 2 diabetes mellitus with other circulatory complications: Secondary | ICD-10-CM

## 2016-09-02 DIAGNOSIS — F320 Major depressive disorder, single episode, mild: Secondary | ICD-10-CM

## 2016-09-02 DIAGNOSIS — E6609 Other obesity due to excess calories: Secondary | ICD-10-CM

## 2016-09-02 DIAGNOSIS — Z6838 Body mass index (BMI) 38.0-38.9, adult: Secondary | ICD-10-CM

## 2016-09-02 LAB — HEMOGLOBIN A1C: HEMOGLOBIN A1C: 6.7 % — AB (ref 4.6–6.5)

## 2016-09-02 LAB — COMPREHENSIVE METABOLIC PANEL
ALBUMIN: 4 g/dL (ref 3.5–5.2)
ALK PHOS: 90 U/L (ref 39–117)
ALT: 16 U/L (ref 0–35)
AST: 13 U/L (ref 0–37)
BUN: 17 mg/dL (ref 6–23)
CO2: 27 mEq/L (ref 19–32)
Calcium: 8.9 mg/dL (ref 8.4–10.5)
Chloride: 105 mEq/L (ref 96–112)
Creatinine, Ser: 0.86 mg/dL (ref 0.40–1.20)
GFR: 70.64 mL/min (ref >60.00)
Glucose, Bld: 135 mg/dL — ABNORMAL HIGH (ref 70–99)
POTASSIUM: 4 meq/L (ref 3.5–5.1)
Sodium: 141 mEq/L (ref 135–145)
TOTAL PROTEIN: 6.6 g/dL (ref 6.0–8.3)
Total Bilirubin: 0.4 mg/dL (ref 0.2–1.2)

## 2016-09-02 LAB — LDL CHOLESTEROL, DIRECT: Direct LDL: 78 mg/dL

## 2016-09-02 LAB — MICROALBUMIN / CREATININE URINE RATIO
Creatinine,U: 166.3 mg/dL
MICROALB UR: 2.8 mg/dL — AB (ref 0.0–1.9)
Microalb Creat Ratio: 1.7 mg/g (ref 0.0–30.0)

## 2016-09-02 MED ORDER — FLUTICASONE PROPIONATE 50 MCG/ACT NA SUSP: 2.0000 | Freq: Every day | NASAL | 4 refills | Status: DC

## 2016-09-02 MED ORDER — AMLODIPINE BESYLATE 2.5 MG PO TABS: 2.5000 mg | ORAL_TABLET | Freq: Every day | ORAL | 3 refills | Status: DC

## 2016-09-02 NOTE — Progress Notes (Addendum)
Subjective:  Patient ID: Cheryl Wallace, female    DOB: 04/02/1951  Age: 65 y.o. MRN: [id number]  CC: The primary encounter diagnosis was Hypertension associated with diabetes ([place]). Diagnoses of Diabetes mellitus without complication ([place]), Hyperlipidemia due to type 2 diabetes mellitus ([place]), Essential hypertension, Hyperlipidemia associated with type 2 diabetes mellitus ([place]), Class 2 obesity due to excess calories with serious comorbidity and body mass index (BMI) of 38.0 to 38.9 in adult, Depression, major, single episode, mild ([place]), and Chronic cough were also pertinent to this visit.  HPI Cheryl Wallace presents for FOLLOW UP ON DIABETES, CHRONIC COUGH  AND HYPERTENSION   HAS NOT BEEN TAKING AMLODIPINE . SAID THE PHARMACY NEVER RECEIVED,  PATIENT NEVER CALLED   DESPITE [place] CHART MESSAGES SEND IN MID September SHOWS THAT THEY WERE READ ON SEPT 12    NOT FASTING  HAS BEEN COUGHING SINCE LAST VISIT . Despite taking benadryl at night  HAS TRIED CLARITIN IN AN AND BENADRYL.  AT NIGHT  . RAN OUT OF FLONASE     BLOOD SUGARS: NOT CHECKING. ,  WENT ON A CRUISE 9 DAYS [place] AND [place]. LOTS OF EXCURSION   WENT ZIP [employer],  [employer].  ATE A LOT   HAS DROPPED 12 LBS,  EATING A LOT OF PROTEIN   DEPRESSION SCREEN positive:  TAKES AMBIEN 3/WEEK,  DOESN'T SLEEP ALL NIGHT EVEN WHEN SHE TAKES IT .  MARRIED 32 YEARS IN SAME HOUSE, WAKES UP CRYING MISSES HUSBAND NOT IN CON TACT VERY MUCH     Lab Results  Component Value Date   HGBA1C 6.7 (H) 09/02/2016     Outpatient Medications Prior to Visit  Medication Sig Dispense Refill  . cholecalciferol (VITAMIN D) 1000 UNITS tablet Take 1,000 Units by mouth daily.     . famotidine (PEPCID) 20 MG tablet Take 1 tablet (20 mg total) by mouth 2 (two) times daily. 180 tablet 4  . metFORMIN (GLUCOPHAGE) 500 MG tablet Take 2 tablets (1,000 mg total) by mouth 2 (two) times daily with a meal. 180 tablet 3  . zolpidem (AMBIEN) 5 MG tablet  Take 1 tablet (5 mg total) by mouth at bedtime as needed for sleep. 30 tablet 4  . losartan (COZAAR) 50 MG tablet TAKE 1 TABLET (50 MG TOTAL) BY MOUTH DAILY. 90 tablet 1  . zoster vaccine live, PF, (ZOSTAVAX) 16109 UNT/0.65ML injection Inject 19,400 Units into the skin once. (Patient not taking: Reported on 09/02/2016) 1 each 0  . amLODipine (NORVASC) 2.5 MG tablet Take 1 tablet (2.5 mg total) by mouth daily. (Patient not taking: Reported on 09/02/2016) 90 tablet 3  . baclofen (LIORESAL) 10 MG tablet TAKE 1 TABLET (10 MG TOTAL) BY MOUTH AT BEDTIME AS NEEDED FOR MUSCLE SPASMS. (Patient not taking: Reported on 05/12/2016) 30 tablet 0  . fluticasone (FLONASE) 50 MCG/ACT nasal spray Place into the nose.    . scopolamine (TRANSDERM-SCOP, 1.5 MG,) 1 MG/3DAYS Place 1 patch (1.5 mg total) onto the skin every 3 (three) days. 10 patch 12   No facility-administered medications prior to visit.     Review of Systems;  Patient denies headache, fevers, malaise, unintentional weight loss, skin rash, eye pain, sinus congestion and sinus pain, sore throat, dysphagia,  hemoptysis , cough, dyspnea, wheezing, chest pain, palpitations, orthopnea, edema, abdominal pain, nausea, melena, diarrhea, constipation, flank pain, dysuria, hematuria, urinary  Frequency, nocturia, numbness, tingling, seizures,  Focal weakness, Loss of consciousness,  Tremor, insomnia, depression, anxiety, and suicidal  ideation.      Objective:  BP (!) 142/86   Pulse 88   Temp 98.1 F (36.7 C) (Oral)   Resp 16   Ht 5\' 5"  (1.651 m)   Wt 232 lb 12 oz (105.6 kg)   SpO2 96%   BMI 38.73 kg/m   BP Readings from Last 3 Encounters:  09/02/16 (!) 142/86  05/12/16 (!) 160/86  03/15/16 138/86    Wt Readings from Last 3 Encounters:  09/02/16 232 lb 12 oz (105.6 kg)  05/12/16 244 lb 6.4 oz (110.9 kg)  03/15/16 235 lb 4 oz (106.7 kg)    General appearance: alert, cooperative and appears stated age Ears: normal TM's and external ear canals  both ears Throat: lips, mucosa, and tongue normal; teeth and gums normal Neck: no adenopathy, no carotid bruit, supple, symmetrical, trachea midline and thyroid not enlarged, symmetric, no tenderness/mass/nodules Back: symmetric, no curvature. ROM normal. No CVA tenderness. Lungs: clear to auscultation bilaterally Heart: regular rate and rhythm, S1, S2 normal, no murmur, click, rub or gallop Abdomen: soft, non-tender; bowel sounds normal; no masses,  no organomegaly Pulses: 2+ and symmetric Skin: Skin color, texture, turgor normal. No rashes or lesions Lymph nodes: Cervical, supraclavicular, and axillary nodes normal.  Lab Results  Component Value Date   HGBA1C 6.7 (H) 09/02/2016   HGBA1C 7.6 (H) 05/12/2016   HGBA1C 7.2 (H) 10/14/2015    Lab Results  Component Value Date   CREATININE 0.86 09/02/2016   CREATININE 0.91 05/12/2016   CREATININE 0.97 10/14/2015    Lab Results  Component Value Date   WBC 8.6 09/10/2015   HGB 10.3 (L) 09/10/2015   HCT 33.2 (L) 09/10/2015   PLT 287 09/10/2015   GLUCOSE 135 (H) 09/02/2016   CHOL 159 05/12/2016   TRIG 201.0 (H) 05/12/2016   HDL 42.40 05/12/2016   LDLDIRECT 78.0 09/02/2016   LDLCALC 105 (H) 03/12/2015   ALT 16 09/02/2016   AST 13 09/02/2016   NA 141 09/02/2016   K 4.0 09/02/2016   CL 105 09/02/2016   CREATININE 0.86 09/02/2016   BUN 17 09/02/2016   CO2 27 09/02/2016   TSH 2.44 03/20/2014   HGBA1C 6.7 (H) 09/02/2016   MICROALBUR 2.8 (H) 09/02/2016    No results found.  Assessment & Plan:   Problem List Items Addressed This Visit    Chronic cough    Adding flonase to current regimen of bedtime benadryl and daily PPI       Depression, major, single episode, mild ([place])    Diagnosed today with screening,  She declines treatment.       Diabetes mellitus without complication ([place])    Improved control with  Metformin dose increased to 1000 mg bid.  Will be advised to add baby aspirin and statin daily.    3 month follow up  advised.   Lab Results  Component Value Date   HGBA1C 6.7 (H) 09/02/2016   Lab Results  Component Value Date   MICROALBUR 2.8 (H) 09/02/2016         Relevant Orders   Hemoglobin A1c (Completed)   Essential hypertension    Uncontrolled on losartan alone.  Adding amlodipine 2.5 mg daily       Relevant Medications   amLODipine (NORVASC) 2.5 MG tablet   Hyperlipidemia associated with type 2 diabetes mellitus ([employer])    Using the Framingham risk calculator,  her 10 year risk of coronary artery disease is 23%,  Will recommend statin therapy  Lab Results  Component Value Date   CHOL 159 05/12/2016   HDL 42.40 05/12/2016   LDLCALC 105 (H) 03/12/2015   LDLDIRECT 78.0 09/02/2016   TRIG 201.0 (H) 05/12/2016   CHOLHDL 4 05/12/2016         Relevant Medications   amLODipine (NORVASC) 2.5 MG tablet   RESOLVED: Hypertension associated with diabetes ([place]) - Primary   Relevant Medications   amLODipine (NORVASC) 2.5 MG tablet   Other Relevant Orders   Comprehensive metabolic panel (Completed)   Microalbumin / creatinine urine ratio (Completed)   Obesity    I have congratulated her in reduction of   BMI and encouraged  Continued weight loss with goal of 10% of body weigh over the next 6 months using a low glycemic index diet and regular exercise a minimum of 5 days per week.         Other Visit Diagnoses    Hyperlipidemia due to type 2 diabetes mellitus ([place])       Relevant Medications   amLODipine (NORVASC) 2.5 MG tablet   Other Relevant Orders   LDL cholesterol, direct (Completed)      I have discontinued Ms. Redlich's baclofen and scopolamine. I have also changed her fluticasone. Additionally, I am having her maintain her cholecalciferol, zoster vaccine live (PF), famotidine, zolpidem, metFORMIN, guaiFENesin-codeine, and amLODipine.  Meds ordered this encounter  Medications  . guaiFENesin-codeine 100-10 MG/5ML syrup    Sig: Take by mouth.  [name] amLODipine (NORVASC) 2.5 MG  tablet    Sig: Take 1 tablet (2.5 mg total) by mouth daily.    Dispense:  90 tablet    Refill:  3  . fluticasone (FLONASE) 50 MCG/ACT nasal spray    Sig: Place 2 sprays into both nostrils daily.    Dispense:  16 g    Refill:  4    Medications Discontinued During This Encounter  Medication Reason  . baclofen (LIORESAL) 10 MG tablet No longer needed (for PRN medications)  . scopolamine (TRANSDERM-SCOP, 1.5 MG,) 1 MG/3DAYS No longer needed (for PRN medications)  . amLODipine (NORVASC) 2.5 MG tablet Reorder  . fluticasone (FLONASE) 50 MCG/ACT nasal spray Reorder    Follow-up: Return in about 3 months (around 12/01/2016).   Crecencio Mc, MD

## 2016-09-02 NOTE — Progress Notes (Signed)
Pre-visit discussion using our clinic review tool. No additional management support is needed unless otherwise documented below in the visit note.  

## 2016-09-02 NOTE — Patient Instructions (Addendum)
YOUR BLOOD PRESSURE IS NOT AT GOAL YET .  I AM SENDING AMLODIPINE AGAIN TO YOUR PHARMACY.  START AT 2.5 MG DAILY   INCREASE TO 5 MG DAILY AFTER 1-2 WEEKS  IF YOUR  BP IS NOT 120/70   We are adding back Flonase  2 SQUIRTS ON EACH SIDE DAILY   Please start taking 81 mg aspirin daily to prevent heart attacks

## 2016-09-03 MED ORDER — LOSARTAN POTASSIUM 50 MG PO TABS: ORAL_TABLET | ORAL | 1 refills | Status: DC

## 2016-09-03 NOTE — Telephone Encounter (Signed)
Sent rx per last OV note

## 2016-09-04 ENCOUNTER — Encounter: Payer: Self-pay | Admitting: Internal Medicine

## 2016-09-04 DIAGNOSIS — R05 Cough: Secondary | ICD-10-CM | POA: Insufficient documentation

## 2016-09-04 DIAGNOSIS — E1169 Type 2 diabetes mellitus with other specified complication: Secondary | ICD-10-CM | POA: Insufficient documentation

## 2016-09-04 DIAGNOSIS — E785 Hyperlipidemia, unspecified: Secondary | ICD-10-CM

## 2016-09-04 DIAGNOSIS — F320 Major depressive disorder, single episode, mild: Secondary | ICD-10-CM

## 2016-09-04 DIAGNOSIS — R053 Chronic cough: Secondary | ICD-10-CM | POA: Insufficient documentation

## 2016-09-04 HISTORY — DX: Major depressive disorder, single episode, mild: F32.0

## 2016-09-04 NOTE — Assessment & Plan Note (Signed)
I have congratulated her in reduction of   BMI and encouraged  Continued weight loss with goal of 10% of body weigh over the next 6 months using a low glycemic index diet and regular exercise a minimum of 5 days per week.    

## 2016-09-04 NOTE — Assessment & Plan Note (Signed)
Diagnosed today with screening,  She declines treatment.

## 2016-09-04 NOTE — Assessment & Plan Note (Signed)
Adding flonase to current regimen of bedtime benadryl and daily PPI

## 2016-09-04 NOTE — Assessment & Plan Note (Signed)
Uncontrolled on losartan alone.  Adding amlodipine 2.5 mg daily

## 2016-09-04 NOTE — Assessment & Plan Note (Signed)
Using the Framingham risk calculator,  her 10 year risk of coronary artery disease is 23%,  Will recommend statin therapy  Lab Results  Component Value Date   CHOL 159 05/12/2016   HDL 42.40 05/12/2016   LDLCALC 105 (H) 03/12/2015   LDLDIRECT 78.0 09/02/2016   TRIG 201.0 (H) 05/12/2016   CHOLHDL 4 05/12/2016

## 2016-09-06 ENCOUNTER — Encounter: Payer: Self-pay | Admitting: Internal Medicine

## 2016-09-06 NOTE — Assessment & Plan Note (Signed)
Improved control with  Metformin dose increased to 1000 mg bid.  Will be advised to add baby aspirin and statin daily.    3 month follow up advised.   Lab Results  Component Value Date   HGBA1C 6.7 (H) 09/02/2016   Lab Results  Component Value Date   MICROALBUR 2.8 (H) 09/02/2016

## 2016-09-07 NOTE — Telephone Encounter (Signed)
FYI , patient was not fasting at the time of her last labs,

## 2016-11-14 ENCOUNTER — Other Ambulatory Visit: Payer: Self-pay | Admitting: Internal Medicine

## 2016-12-01 ENCOUNTER — Encounter: Payer: Self-pay | Admitting: Internal Medicine

## 2016-12-01 ENCOUNTER — Ambulatory Visit (INDEPENDENT_AMBULATORY_CARE_PROVIDER_SITE_OTHER): Payer: BC Managed Care – PPO | Admitting: Internal Medicine

## 2016-12-01 VITALS — BP 136/70 | HR 78 | Temp 97.4°F | Resp 16 | Ht 65.0 in | Wt 235.2 lb

## 2016-12-01 DIAGNOSIS — E785 Hyperlipidemia, unspecified: Secondary | ICD-10-CM

## 2016-12-01 DIAGNOSIS — E119 Type 2 diabetes mellitus without complications: Secondary | ICD-10-CM | POA: Diagnosis not present

## 2016-12-01 DIAGNOSIS — E1169 Type 2 diabetes mellitus with other specified complication: Secondary | ICD-10-CM

## 2016-12-01 DIAGNOSIS — I10 Essential (primary) hypertension: Secondary | ICD-10-CM

## 2016-12-01 LAB — COMPREHENSIVE METABOLIC PANEL
ALT: 15 U/L (ref 0–35)
AST: 15 U/L (ref 0–37)
Albumin: 3.9 g/dL (ref 3.5–5.2)
Alkaline Phosphatase: 89 U/L (ref 39–117)
BUN: 15 mg/dL (ref 6–23)
CHLORIDE: 104 meq/L (ref 96–112)
CO2: 28 meq/L (ref 19–32)
Calcium: 8.9 mg/dL (ref 8.4–10.5)
Creatinine, Ser: 0.9 mg/dL (ref 0.40–1.20)
GFR: 66.98 mL/min (ref >60.00)
Glucose, Bld: 134 mg/dL — ABNORMAL HIGH (ref 70–99)
Potassium: 4.5 mEq/L (ref 3.5–5.1)
SODIUM: 139 meq/L (ref 135–145)
Total Bilirubin: 0.5 mg/dL (ref 0.2–1.2)
Total Protein: 6.5 g/dL (ref 6.0–8.3)

## 2016-12-01 LAB — LIPID PANEL
CHOL/HDL RATIO: 4
Cholesterol: 159 mg/dL (ref 0–200)
HDL: 41.2 mg/dL (ref >39.00)
NONHDL: 117.34
TRIGLYCERIDES: 208 mg/dL — AB (ref 0.0–149.0)
VLDL: 41.6 mg/dL — ABNORMAL HIGH (ref 0.0–40.0)

## 2016-12-01 LAB — POCT GLYCOSYLATED HEMOGLOBIN (HGB A1C): Hemoglobin A1C: 7

## 2016-12-01 LAB — LDL CHOLESTEROL, DIRECT: Direct LDL: 85 mg/dL

## 2016-12-01 MED ORDER — AMLODIPINE BESYLATE 5 MG PO TABS: 5.0000 mg | ORAL_TABLET | Freq: Every day | ORAL | 0 refills | Status: DC

## 2016-12-01 MED ORDER — ZOLPIDEM TARTRATE 5 MG PO TABS: 5.0000 mg | ORAL_TABLET | Freq: Every evening | ORAL | 5 refills | Status: DC | PRN

## 2016-12-01 NOTE — Progress Notes (Signed)
Subjective:  Patient ID: Cheryl Wallace, female    DOB: July 13, 1951  Age: 66 y.o. MRN: [id number]  CC: The primary encounter diagnosis was Diabetes mellitus without complication ([place]). Diagnoses of Essential hypertension and Hyperlipidemia associated with type 2 diabetes mellitus ([place]) were also pertinent to this visit.  HPI Cheryl Wallace presents for 3 month follow up on diabetes.  Patient has no complaints today.  Patient is following a low glycemic index diet and taking all prescribed medications regularly without side effects.  Fasting sugars have been under less than 120 most of the time and post prandials have been under 140 except on rare occasions. Patient is not exercising or intentionally trying to lose weight .  Patient has had an eye exam in the last 12 months and checks feet regularly for signs ofside,  And denies an numbness tingling or burning in feet. Patient is up to date on all recommended vaccinations   saw ENT for persistent PND.  Allergy testing was negative. New nasal spray made her sick , But stopped the nasal drip.   Eye exam done last week,  Normal by [facility] . Feet doing fine   Has minimal pain at rest . Sleeping causes right  shoulder pain /stiffness left shoulder better after manipulation.   Knees are fine   Exercising?  No but bought an apple watch to guide her efforts .  plans to start walking 30 minutes daily   And bought an elliptical .    Lab Results  Component Value Date   HGBA1C 7.0 12/01/2016         Outpatient Medications Prior to Visit  Medication Sig Dispense Refill  . cholecalciferol (VITAMIN D) 1000 UNITS tablet Take 1,000 Units by mouth daily.     . famotidine (PEPCID) 20 MG tablet Take 1 tablet (20 mg total) by mouth 2 (two) times daily. 180 tablet 4  . losartan (COZAAR) 50 MG tablet TAKE 1 TABLET (50 MG TOTAL) BY MOUTH DAILY. 90 tablet 1  . metFORMIN (GLUCOPHAGE) 500 MG tablet Take 2 tablets (1,000 mg total) by mouth 2 (two) times daily  with a meal. 180 tablet 3  . amLODipine (NORVASC) 2.5 MG tablet Take 1 tablet (2.5 mg total) by mouth daily. 90 tablet 3  . zolpidem (AMBIEN) 5 MG tablet TAKE 1 TABLET BY MOUTH AT BEDTIME AS NEEDED FOR SLEEP 30 tablet 0  . fluticasone (FLONASE) 50 MCG/ACT nasal spray Place 2 sprays into both nostrils daily. 16 g 4  . zoster vaccine live, PF, (ZOSTAVAX) 08657 UNT/0.65ML injection Inject 19,400 Units into the skin once. (Patient not taking: Reported on 09/02/2016) 1 each 0  . guaiFENesin-codeine 100-10 MG/5ML syrup Take by mouth.     No facility-administered medications prior to visit.     Review of Systems;  Patient denies headache, fevers, malaise, unintentional weight loss, skin rash, eye pain, sinus congestion and sinus pain, sore throat, dysphagia,  hemoptysis , cough, dyspnea, wheezing, chest pain, palpitations, orthopnea, edema, abdominal pain, nausea, melena, diarrhea, constipation, flank pain, dysuria, hematuria, urinary  Frequency, nocturia, numbness, tingling, seizures,  Focal weakness, Loss of consciousness,  Tremor, insomnia, depression, anxiety, and suicidal ideation.      Objective:  BP 136/70   Pulse 78   Temp 97.4 F (36.3 C) (Oral)   Resp 16   Ht 5\' 5"  (1.651 m)   Wt 235 lb 3.2 oz (106.7 kg)   SpO2 96%   BMI 39.14 kg/m   BP Readings from Last  3 Encounters:  12/01/16 136/70  09/02/16 (!) 142/86  05/12/16 (!) 160/86    Wt Readings from Last 3 Encounters:  12/01/16 235 lb 3.2 oz (106.7 kg)  09/02/16 232 lb 12 oz (105.6 kg)  05/12/16 244 lb 6.4 oz (110.9 kg)    General appearance: alert, cooperative and appears stated age Ears: normal TM's and external ear canals both ears Throat: lips, mucosa, and tongue normal; teeth and gums normal Neck: no adenopathy, no carotid bruit, supple, symmetrical, trachea midline and thyroid not enlarged, symmetric, no tenderness/mass/nodules Back: symmetric, no curvature. ROM normal. No CVA tenderness. Lungs: clear to  auscultation bilaterally Heart: regular rate and rhythm, S1, S2 normal, no murmur, click, rub or gallop Abdomen: soft, non-tender; bowel sounds normal; no masses,  no organomegaly Pulses: 2+ and symmetric Skin: Skin color, texture, turgor normal. No rashes or lesions Lymph nodes: Cervical, supraclavicular, and axillary nodes normal.  Lab Results  Component Value Date   HGBA1C 7.0 12/01/2016   HGBA1C 6.7 (H) 09/02/2016   HGBA1C 7.6 (H) 05/12/2016    Lab Results  Component Value Date   CREATININE 0.90 12/01/2016   CREATININE 0.86 09/02/2016   CREATININE 0.91 05/12/2016    Lab Results  Component Value Date   WBC 8.6 09/10/2015   HGB 10.3 (L) 09/10/2015   HCT 33.2 (L) 09/10/2015   PLT 287 09/10/2015   GLUCOSE 134 (H) 12/01/2016   CHOL 159 12/01/2016   TRIG 208.0 (H) 12/01/2016   HDL 41.20 12/01/2016   LDLDIRECT 85.0 12/01/2016   LDLCALC 105 (H) 03/12/2015   ALT 15 12/01/2016   AST 15 12/01/2016   NA 139 12/01/2016   K 4.5 12/01/2016   CL 104 12/01/2016   CREATININE 0.90 12/01/2016   BUN 15 12/01/2016   CO2 28 12/01/2016   TSH 2.44 03/20/2014   HGBA1C 7.0 12/01/2016   MICROALBUR 2.8 (H) 09/02/2016    No results found.  Assessment & Plan:   Problem List Items Addressed This Visit    Diabetes mellitus without complication ([place]) - Primary    Slight loss of control comparatively,  But no changes to medications today.  will be advised to add baby aspirin and statin daily.    3 month follow up advised.   Lab Results  Component Value Date   HGBA1C 7.0 12/01/2016   Lab Results  Component Value Date   MICROALBUR 2.8 (H) 09/02/2016         Relevant Orders   POCT HgB A1C (Completed)   Comprehensive metabolic panel (Completed)   Essential hypertension    Not at goal  on current regimen. Renal function stable, advised to increase alodipine to 5 mg and continue losartan 50 mg daily. Lab Results  Component Value Date   CREATININE 0.90 12/01/2016   Lab Results    Component Value Date   NA 139 12/01/2016   K 4.5 12/01/2016   CL 104 12/01/2016   CO2 28 12/01/2016         Relevant Medications   amLODipine (NORVASC) 5 MG tablet   Hyperlipidemia associated with type 2 diabetes mellitus ([place])    Using the Framingham risk calculator,  her 10 year risk of coronary artery disease is 23%,  Will recommend statin therapy  Lab Results  Component Value Date   CHOL 159 12/01/2016   HDL 41.20 12/01/2016   LDLCALC 105 (H) 03/12/2015   LDLDIRECT 85.0 12/01/2016   TRIG 208.0 (H) 12/01/2016   CHOLHDL 4 12/01/2016  Relevant Medications   amLODipine (NORVASC) 5 MG tablet   Other Relevant Orders   Lipid panel (Completed)   LDL cholesterol, direct (Completed)     .A total of 25 minutes of face to face time was spent with patient more than half of which was spent in counselling about the above mentioned conditions  and coordination of care   I have discontinued Ms. Nauta's guaiFENesin-codeine. I have also changed her amLODipine and zolpidem. Additionally, I am having her maintain her cholecalciferol, zoster vaccine live (PF), famotidine, metFORMIN, fluticasone, losartan, and fluticasone.  Meds ordered this encounter  Medications  . fluticasone (FLONASE) 50 MCG/ACT nasal spray  . amLODipine (NORVASC) 5 MG tablet    Sig: Take 1 tablet (5 mg total) by mouth daily.    Dispense:  90 tablet    Refill:  0  . zolpidem (AMBIEN) 5 MG tablet    Sig: Take 1 tablet (5 mg total) by mouth at bedtime as needed. for sleep    Dispense:  30 tablet    Refill:  5    May refill or or after December 15 2016    Medications Discontinued During This Encounter  Medication Reason  . guaiFENesin-codeine 100-10 MG/5ML syrup Patient has not taken in last 30 days  . amLODipine (NORVASC) 2.5 MG tablet Reorder  . zolpidem (AMBIEN) 5 MG tablet Reorder    Follow-up: Return in about 3 months (around 03/03/2017) for 90+ days , follow up diabetes.   Crecencio Mc, MD

## 2016-12-01 NOTE — Progress Notes (Signed)
Pre visit review using our clinic review tool, if applicable. No additional management support is needed unless otherwise documented below in the visit note. 

## 2016-12-01 NOTE — Patient Instructions (Addendum)
  The new goals for optimal blood pressure management are 120/70.   Increase the amlodipine to 5 mg daily   Continue losartan 50 mg daily   Your diabetes remains under excellent control  And your cholesterol and other labs are also normal.

## 2016-12-03 NOTE — Assessment & Plan Note (Signed)
Slight loss of control comparatively,  But no changes to medications today.  will be advised to add baby aspirin and statin daily.    3 month follow up advised.   Lab Results  Component Value Date   HGBA1C 7.0 12/01/2016   Lab Results  Component Value Date   MICROALBUR 2.8 (H) 09/02/2016

## 2016-12-03 NOTE — Assessment & Plan Note (Signed)
Using the Framingham risk calculator,  her 10 year risk of coronary artery disease is 23%,  Will recommend statin therapy  Lab Results  Component Value Date   CHOL 159 12/01/2016   HDL 41.20 12/01/2016   LDLCALC 105 (H) 03/12/2015   LDLDIRECT 85.0 12/01/2016   TRIG 208.0 (H) 12/01/2016   CHOLHDL 4 12/01/2016

## 2016-12-03 NOTE — Assessment & Plan Note (Addendum)
Not at goal  on current regimen. Renal function stable, advised to increase alodipine to 5 mg and continue losartan 50 mg daily. Lab Results  Component Value Date   CREATININE 0.90 12/01/2016   Lab Results  Component Value Date   NA 139 12/01/2016   K 4.5 12/01/2016   CL 104 12/01/2016   CO2 28 12/01/2016

## 2016-12-05 ENCOUNTER — Encounter: Payer: Self-pay | Admitting: Internal Medicine

## 2016-12-06 ENCOUNTER — Other Ambulatory Visit: Payer: Self-pay | Admitting: Internal Medicine

## 2016-12-06 DIAGNOSIS — E785 Hyperlipidemia, unspecified: Principal | ICD-10-CM

## 2016-12-06 DIAGNOSIS — E1169 Type 2 diabetes mellitus with other specified complication: Secondary | ICD-10-CM

## 2016-12-06 DIAGNOSIS — Z79899 Other long term (current) drug therapy: Secondary | ICD-10-CM

## 2016-12-06 MED ORDER — ATORVASTATIN CALCIUM 20 MG PO TABS: 20.0000 mg | ORAL_TABLET | Freq: Every day | ORAL | 0 refills | Status: DC

## 2017-01-02 ENCOUNTER — Other Ambulatory Visit: Payer: Self-pay | Admitting: Internal Medicine

## 2017-02-07 ENCOUNTER — Ambulatory Visit: Payer: BC Managed Care – PPO | Admitting: Anesthesiology

## 2017-02-07 ENCOUNTER — Encounter
Admission: RE | Disposition: A | Discharge status: Home / Self Care | Payer: Self-pay | Source: Ambulatory Visit | Attending: Orthopedic Surgery

## 2017-02-07 ENCOUNTER — Ambulatory Visit: Payer: BC Managed Care – PPO

## 2017-02-07 ENCOUNTER — Encounter: Payer: Self-pay | Admitting: *Deleted

## 2017-02-07 ENCOUNTER — Ambulatory Visit
Admission: RE | Admit: 2017-02-07 | Discharge: 2017-02-07 | Disposition: A | Discharge status: Home / Self Care | Payer: BC Managed Care – PPO | Source: Ambulatory Visit | Attending: Orthopedic Surgery | Admitting: Orthopedic Surgery

## 2017-02-07 DIAGNOSIS — I10 Essential (primary) hypertension: Secondary | ICD-10-CM | POA: Diagnosis not present

## 2017-02-07 DIAGNOSIS — S52571A Other intraarticular fracture of lower end of right radius, initial encounter for closed fracture: Secondary | ICD-10-CM | POA: Insufficient documentation

## 2017-02-07 DIAGNOSIS — K219 Gastro-esophageal reflux disease without esophagitis: Secondary | ICD-10-CM | POA: Insufficient documentation

## 2017-02-07 DIAGNOSIS — Z7984 Long term (current) use of oral hypoglycemic drugs: Secondary | ICD-10-CM | POA: Insufficient documentation

## 2017-02-07 DIAGNOSIS — Z8781 Personal history of (healed) traumatic fracture: Secondary | ICD-10-CM

## 2017-02-07 DIAGNOSIS — E669 Obesity, unspecified: Secondary | ICD-10-CM | POA: Insufficient documentation

## 2017-02-07 DIAGNOSIS — Z9889 Other specified postprocedural states: Secondary | ICD-10-CM

## 2017-02-07 DIAGNOSIS — Z79899 Other long term (current) drug therapy: Secondary | ICD-10-CM | POA: Diagnosis not present

## 2017-02-07 DIAGNOSIS — Z87891 Personal history of nicotine dependence: Secondary | ICD-10-CM | POA: Diagnosis not present

## 2017-02-07 DIAGNOSIS — E119 Type 2 diabetes mellitus without complications: Secondary | ICD-10-CM | POA: Insufficient documentation

## 2017-02-07 DIAGNOSIS — Z6839 Body mass index (BMI) 39.0-39.9, adult: Secondary | ICD-10-CM | POA: Insufficient documentation

## 2017-02-07 DIAGNOSIS — F329 Major depressive disorder, single episode, unspecified: Secondary | ICD-10-CM | POA: Diagnosis not present

## 2017-02-07 HISTORY — PX: OPEN REDUCTION INTERNAL FIXATION (ORIF) DISTAL RADIAL FRACTURE: SHX5989

## 2017-02-07 LAB — GLUCOSE, CAPILLARY: Glucose-Capillary: 113 mg/dL — ABNORMAL HIGH (ref 65–99)

## 2017-02-07 LAB — POCT CBG MONITORING: CBG: 144

## 2017-02-07 SURGERY — OPEN REDUCTION INTERNAL FIXATION (ORIF) DISTAL RADIUS FRACTURE
Anesthesia: General | Site: Wrist | Laterality: Right | Wound class: Clean

## 2017-02-07 MED ORDER — HYDROCODONE-ACETAMINOPHEN 5-325 MG PO TABS: 1.0000 | ORAL_TABLET | Freq: Four times a day (QID) | ORAL | 0 refills | Status: DC | PRN

## 2017-02-07 MED ORDER — NEOMYCIN-POLYMYXIN B GU 40-200000 IR SOLN
Status: DC | PRN
  Administered 2017-02-07: 2 mL

## 2017-02-07 MED ORDER — PROPOFOL 10 MG/ML IV BOLUS
INTRAVENOUS | Status: DC | PRN
  Administered 2017-02-07: 160 mg via INTRAVENOUS

## 2017-02-07 MED ORDER — OXYCODONE HCL 5 MG/5ML PO SOLN: 5.0000 mg | Freq: Once | ORAL | Status: AC | PRN

## 2017-02-07 MED ORDER — ACETAMINOPHEN 10 MG/ML IV SOLN
INTRAVENOUS | Status: DC | PRN
  Administered 2017-02-07: 1000 mg via INTRAVENOUS

## 2017-02-07 MED ORDER — METOCLOPRAMIDE HCL 10 MG PO TABS: 5.0000 mg | ORAL_TABLET | Freq: Three times a day (TID) | ORAL | Status: DC | PRN

## 2017-02-07 MED ORDER — ROCURONIUM BROMIDE 100 MG/10ML IV SOLN
INTRAVENOUS | Status: DC | PRN
  Administered 2017-02-07: 50 mg via INTRAVENOUS

## 2017-02-07 MED ORDER — PROMETHAZINE HCL 25 MG/ML IJ SOLN: 6.2500 mg | INTRAMUSCULAR | Status: DC | PRN

## 2017-02-07 MED ORDER — ROCURONIUM BROMIDE 50 MG/5ML IV SOLN
INTRAVENOUS | Status: AC
  Filled 2017-02-07: qty 1

## 2017-02-07 MED ORDER — SODIUM CHLORIDE 0.9 % IV SOLN: INTRAVENOUS | Status: DC

## 2017-02-07 MED ORDER — SUGAMMADEX SODIUM 200 MG/2ML IV SOLN
INTRAVENOUS | Status: DC | PRN
  Administered 2017-02-07: 217.8 mg via INTRAVENOUS

## 2017-02-07 MED ORDER — FENTANYL CITRATE (PF) 100 MCG/2ML IJ SOLN
INTRAMUSCULAR | Status: AC
  Filled 2017-02-07: qty 2

## 2017-02-07 MED ORDER — HYDROCODONE-ACETAMINOPHEN 5-325 MG PO TABS: 1.0000 | ORAL_TABLET | ORAL | Status: DC | PRN

## 2017-02-07 MED ORDER — SODIUM CHLORIDE 0.9 % IV SOLN
INTRAVENOUS | Status: DC
Start: 2017-02-07 — End: 2017-02-07
  Administered 2017-02-07: 11:00:00 via INTRAVENOUS

## 2017-02-07 MED ORDER — ACETAMINOPHEN 10 MG/ML IV SOLN
INTRAVENOUS | Status: AC
  Filled 2017-02-07: qty 100

## 2017-02-07 MED ORDER — FENTANYL CITRATE (PF) 250 MCG/5ML IJ SOLN
INTRAMUSCULAR | Status: AC
  Filled 2017-02-07: qty 5

## 2017-02-07 MED ORDER — FENTANYL CITRATE (PF) 100 MCG/2ML IJ SOLN
25.0000 ug | INTRAMUSCULAR | Status: DC | PRN
  Administered 2017-02-07 (×2): 50 ug via INTRAVENOUS

## 2017-02-07 MED ORDER — ONDANSETRON HCL 4 MG/2ML IJ SOLN
INTRAMUSCULAR | Status: DC | PRN
  Administered 2017-02-07: 4 mg via INTRAVENOUS

## 2017-02-07 MED ORDER — CLINDAMYCIN PHOSPHATE 900 MG/50ML IV SOLN
INTRAVENOUS | Status: AC
  Filled 2017-02-07: qty 50

## 2017-02-07 MED ORDER — FENTANYL CITRATE (PF) 100 MCG/2ML IJ SOLN
INTRAMUSCULAR | Status: DC | PRN
  Administered 2017-02-07: 100 ug via INTRAVENOUS
  Administered 2017-02-07: 50 ug via INTRAVENOUS

## 2017-02-07 MED ORDER — CLINDAMYCIN PHOSPHATE 900 MG/50ML IV SOLN
INTRAVENOUS | Status: DC | PRN
  Administered 2017-02-07: 900 mg via INTRAVENOUS

## 2017-02-07 MED ORDER — MIDAZOLAM HCL 2 MG/2ML IJ SOLN
INTRAMUSCULAR | Status: DC | PRN
  Administered 2017-02-07: 2 mg via INTRAVENOUS

## 2017-02-07 MED ORDER — LIDOCAINE HCL (CARDIAC) 20 MG/ML IV SOLN
INTRAVENOUS | Status: DC | PRN
  Administered 2017-02-07: 60 mg via INTRAVENOUS

## 2017-02-07 MED ORDER — OXYCODONE HCL 5 MG PO TABS
5.0000 mg | ORAL_TABLET | Freq: Once | ORAL | Status: AC | PRN
  Administered 2017-02-07: 5 mg via ORAL

## 2017-02-07 MED ORDER — ONDANSETRON HCL 4 MG/2ML IJ SOLN: 4.0000 mg | Freq: Four times a day (QID) | INTRAMUSCULAR | Status: DC | PRN

## 2017-02-07 MED ORDER — PROPOFOL 10 MG/ML IV BOLUS
INTRAVENOUS | Status: AC
  Filled 2017-02-07: qty 20

## 2017-02-07 MED ORDER — OXYCODONE HCL 5 MG PO TABS
ORAL_TABLET | ORAL | Status: AC
  Filled 2017-02-07: qty 1

## 2017-02-07 MED ORDER — NEOMYCIN-POLYMYXIN B GU 40-200000 IR SOLN
Status: AC
  Filled 2017-02-07: qty 2

## 2017-02-07 MED ORDER — MEPERIDINE HCL 50 MG/ML IJ SOLN: 6.2500 mg | INTRAMUSCULAR | Status: DC | PRN

## 2017-02-07 MED ORDER — LIDOCAINE HCL (PF) 2 % IJ SOLN
INTRAMUSCULAR | Status: AC
  Filled 2017-02-07: qty 2

## 2017-02-07 MED ORDER — ONDANSETRON HCL 4 MG/2ML IJ SOLN
INTRAMUSCULAR | Status: AC
  Filled 2017-02-07: qty 2

## 2017-02-07 MED ORDER — METOCLOPRAMIDE HCL 5 MG/ML IJ SOLN: 5.0000 mg | Freq: Three times a day (TID) | INTRAMUSCULAR | Status: DC | PRN

## 2017-02-07 MED ORDER — SEVOFLURANE IN SOLN
RESPIRATORY_TRACT | Status: AC
  Filled 2017-02-07: qty 250

## 2017-02-07 MED ORDER — MIDAZOLAM HCL 2 MG/2ML IJ SOLN
INTRAMUSCULAR | Status: AC
  Filled 2017-02-07: qty 2

## 2017-02-07 MED ORDER — ONDANSETRON HCL 4 MG PO TABS: 4.0000 mg | ORAL_TABLET | Freq: Four times a day (QID) | ORAL | Status: DC | PRN

## 2017-02-07 SURGICAL SUPPLY — 39 items
BANDAGE ACE 4X5 VEL STRL LF (GAUZE/BANDAGES/DRESSINGS) ×2 IMPLANT
BIT DRILL 2 FAST STEP (BIT) ×2 IMPLANT
BIT DRILL 2.5X4 QC (BIT) ×2 IMPLANT
CANISTER SUCT 1200ML W/VALVE (MISCELLANEOUS) ×2 IMPLANT
CHLORAPREP W/TINT 26ML (MISCELLANEOUS) ×2 IMPLANT
CUFF TOURN 18 STER (MISCELLANEOUS) IMPLANT
DRAPE FLUOR MINI C-ARM 54X84 (DRAPES) ×2 IMPLANT
ELECT REM PT RETURN 9FT ADLT (ELECTROSURGICAL) ×2
ELECTRODE REM PT RTRN 9FT ADLT (ELECTROSURGICAL) ×1 IMPLANT
GAUZE PETRO XEROFOAM 1X8 (MISCELLANEOUS) ×4 IMPLANT
GAUZE SPONGE 4X4 12PLY STRL (GAUZE/BANDAGES/DRESSINGS) ×2 IMPLANT
GAUZE XEROFORM 4X4 STRL (GAUZE/BANDAGES/DRESSINGS) ×2 IMPLANT
GLOVE SURG SYN 9.0  PF PI (GLOVE) ×1
GLOVE SURG SYN 9.0 PF PI (GLOVE) ×1 IMPLANT
GOWN SRG 2XL LVL 4 RGLN SLV (GOWNS) ×1 IMPLANT
GOWN STRL NON-REIN 2XL LVL4 (GOWNS) ×1
GOWN STRL REUS W/ TWL LRG LVL3 (GOWN DISPOSABLE) ×1 IMPLANT
GOWN STRL REUS W/TWL LRG LVL3 (GOWN DISPOSABLE) ×1
K-WIRE 1.6 (WIRE) ×1
K-WIRE FX5X1.6XNS BN SS (WIRE) ×1
KIT RM TURNOVER STRD PROC AR (KITS) ×2 IMPLANT
KWIRE FX5X1.6XNS BN SS (WIRE) ×1 IMPLANT
NEEDLE FILTER BLUNT 18X 1/2SAF (NEEDLE) ×1
NEEDLE FILTER BLUNT 18X1 1/2 (NEEDLE) ×1 IMPLANT
NS IRRIG 500ML POUR BTL (IV SOLUTION) ×2 IMPLANT
PACK EXTREMITY ARMC (MISCELLANEOUS) ×2 IMPLANT
PAD CAST CTTN 4X4 STRL (SOFTGOODS) ×1 IMPLANT
PADDING CAST COTTON 4X4 STRL (SOFTGOODS) ×1
PEG SUBCHONDRAL SMOOTH 2.0X16 (Peg) ×4 IMPLANT
PEG SUBCHONDRAL SMOOTH 2.0X18 (Peg) ×2 IMPLANT
PEG SUBCHONDRAL SMOOTH 2.0X20 (Peg) ×6 IMPLANT
PLATE SHORT 21.6X48.9 NRRW RT (Plate) ×2 IMPLANT
SCREW CORT 3.5X10 LNG (Screw) ×6 IMPLANT
SPLINT CAST 1 STEP 3X12 (MISCELLANEOUS) ×2 IMPLANT
SUT ETHILON 4-0 (SUTURE) ×2
SUT ETHILON 4-0 FS2 18XMFL BLK (SUTURE) ×2
SUT VICRYL 3-0 27IN (SUTURE) ×2 IMPLANT
SUTURE ETHLN 4-0 FS2 18XMF BLK (SUTURE) ×2 IMPLANT
SYR 3ML LL SCALE MARK (SYRINGE) ×2 IMPLANT

## 2017-02-07 NOTE — Anesthesia Postprocedure Evaluation (Signed)
Anesthesia Post Note  Patient: Cheryl Wallace  Procedure(s) Performed: Procedure(s) (LRB): OPEN REDUCTION INTERNAL FIXATION (ORIF) DISTAL RADIAL FRACTURE (Right)  Patient location during evaluation: PACU Anesthesia Type: General Level of consciousness: awake and alert and oriented Pain management: pain level controlled Vital Signs Assessment: post-procedure vital signs reviewed and stable Respiratory status: spontaneous breathing, nonlabored ventilation and respiratory function stable Cardiovascular status: blood pressure returned to baseline and stable Postop Assessment: no signs of nausea or vomiting Anesthetic complications: no     Last Vitals:  Vitals:   02/07/17 1519 02/07/17 1532  BP: (!) 142/72 (!) 163/70  Pulse: 76 75  Resp: 18 16  Temp:  36.4 C    Last Pain:  Vitals:   02/07/17 1532  TempSrc: Temporal  PainSc: 6                  Edker Punt

## 2017-02-07 NOTE — Transfer of Care (Signed)
Immediate Anesthesia Transfer of Care Note  Patient: Cheryl Wallace  Procedure(s) Performed: Procedure(s): OPEN REDUCTION INTERNAL FIXATION (ORIF) DISTAL RADIAL FRACTURE (Right)  Patient Location: PACU  Anesthesia Type:General  Level of Consciousness: awake  Airway & Oxygen Therapy: Patient Spontanous Breathing and Patient connected to face mask oxygen  Post-op Assessment: Report given to RN and Post -op Vital signs reviewed and stable  Post vital signs: Reviewed and stable  Last Vitals:  Vitals:   02/07/17 1030 02/07/17 1434  BP: (!) 179/97 (!) 160/77  Pulse: 88 80  Resp: 20 15  Temp: 36.7 C 36.3 C    Last Pain:  Vitals:   02/07/17 1030  TempSrc: Oral  PainSc: 10-Worst pain ever         Complications: No apparent anesthesia complications

## 2017-02-07 NOTE — Discharge Instructions (Addendum)
Keep arm elevated. Loosen Ace wrap if fingers swell. Work fingers is much as possible. Keep dressing clean and dry. Pain medicine as directed  AMBULATORY SURGERY  DISCHARGE INSTRUCTIONS   1) The drugs that you were given will stay in your system until tomorrow so for the next 24 hours you should not:  A) Drive an automobile B) Make any legal decisions C) Drink any alcoholic beverage   2) You may resume regular meals tomorrow.  Today it is better to start with liquids and gradually work up to solid foods.  You may eat anything you prefer, but it is better to start with liquids, then soup and crackers, and gradually work up to solid foods.   3) Please notify your doctor immediately if you have any unusual bleeding, trouble breathing, redness and pain at the surgery site, drainage, fever, or pain not relieved by medication.   Please contact your physician with any problems or Same Day Surgery at [phone], Monday through Friday 6 am to 4 pm, or [place] at [facility] number at [phone].

## 2017-02-07 NOTE — Anesthesia Preprocedure Evaluation (Signed)
Anesthesia Evaluation  Patient identified by MRN, date of birth, ID band Patient awake    Reviewed: Allergy & Precautions, NPO status , Patient's Chart, lab work & pertinent test results  History of Anesthesia Complications Negative for: history of anesthetic complications  Airway Mallampati: II  TM Distance: >3 FB Neck ROM: Full    Dental  (+) Upper Dentures, Lower Dentures   Pulmonary neg sleep apnea, neg COPD, former smoker,    breath sounds clear to auscultation- rhonchi (-) wheezing      Cardiovascular hypertension, Pt. on medications (-) CAD and (-) Past MI  Rhythm:Regular Rate:Normal - Systolic murmurs and - Diastolic murmurs    Neuro/Psych PSYCHIATRIC DISORDERS Depression    GI/Hepatic Neg liver ROS, GERD  ,  Endo/Other  diabetes, Oral Hypoglycemic Agents  Renal/GU Renal disease: hx of nephrolithiasis.     Musculoskeletal negative musculoskeletal ROS (+)   Abdominal (+) + obese,   Peds  Hematology negative hematology ROS (+)   Anesthesia Other Findings Past Medical History: No date: Allergic rhinitis 1443: Complication of anesthesia     Comment: While pt was under anesthesia, pt had an               asthema attack during BTL. 09/04/2016: Depression, major, single episode, mild (HCC) No date: Elbow tendonitis     Comment: a. bilat, s/p surgery. No date: Essential hypertension 2011: Functional ovarian cysts     Comment: a. s/p TAH (1994);  b. s/p oopherectomy               (~2011) No date: Gallstones No date: GERD (gastroesophageal reflux disease) 2011: Kidney stones     Comment: a. s/p lithotripsy No date: Low grade fever No date: Obesity No date: Tinnitus No date: Type 2 diabetes mellitus ([place])     Comment: a.  Hemoglobin A1c in July 2016, 6.7.   Reproductive/Obstetrics                             Anesthesia Physical Anesthesia Plan  ASA: III  Anesthesia Plan:  General   Post-op Pain Management:    Induction: Intravenous  Airway Management Planned: Oral ETT  Additional Equipment:   Intra-op Plan:   Post-operative Plan: Extubation in OR  Informed Consent: I have reviewed the patients History and Physical, chart, labs and discussed the procedure including the risks, benefits and alternatives for the proposed anesthesia with the patient or authorized representative who has indicated his/her understanding and acceptance.   Dental advisory given  Plan Discussed with: CRNA and Anesthesiologist  Anesthesia Plan Comments:         Anesthesia Quick Evaluation

## 2017-02-07 NOTE — Op Note (Signed)
02/07/2017  2:25 PM  PATIENT:  Cheryl Wallace  66 y.o. female  PRE-OPERATIVE DIAGNOSIS:  displaced fracture distal end Right distal radius intra-articular volar POST-OPERATIVE DIAGNOSIS:  displaced fracture distal end same  PROCEDURE:  Procedure(s): OPEN REDUCTION INTERNAL FIXATION (ORIF) DISTAL RADIAL FRACTURE (Right)  SURGEON: Laurene Footman, MD  ASSISTANTS: none  ANESTHESIA:   general  EBL:  No intake/output data recorded.  BLOOD ADMINISTERED:none  DRAINS: none   LOCAL MEDICATIONS USED:  NONE  SPECIMEN:  No Specimen  DISPOSITION OF SPECIMEN:  N/A  COUNTS:  YES  TOURNIQUET:   Total Tourniquet Time Documented: Upper Arm (Right) - 18 minutes Total: Upper Arm (Right) - 18 minutes   IMPLANTS: [employer] hand innovations short narrow DVR with screws and pegs  DICTATION: .Dragon Dictation with patient was brought to the operating room and general anesthesia obtained, right arm was prepped and draped in sterile fashion. After patient identification and timeout procedures were completed, tourniquet was raised. Volar approach was made over the FCR tendon and the tendon sheath incised with the tendon retracted radially. The deep fascia was incised and the muscle retracted ulnarly. The quadratus was elevated off the radial side and the fracture site exposed. With pressure on the volar fragment distally anatomic alignment was obtained under fluoroscopic views. The short narrow DVR plate was then applied and pinned into position. 3 cortical screws were placed and this acted as a buttress which was the volar fragment which extended into the joint and anatomic position on both AP and lateral projections. Additional smooth pegs were placed and the distal fragment drilling and measuring and placing the smooth pegs giving rigid fixation. Under fluoroscopic exam with motion of the fracture appeared stable. There is no intra-articular penetration of the pegs. The wound was then irrigated and  tourniquet let down. 3-0 Vicryl was used subcutaneously followed by 4-0 nylon for the skin. Xeroform 4 x 4 web roll and Ace wrap applied with a volar splint  PLAN OF CARE: Discharge to home after PACU  PATIENT DISPOSITION:  PACU - hemodynamically stable.

## 2017-02-07 NOTE — Anesthesia Procedure Notes (Signed)
Procedure Name: Intubation Date/Time: 02/07/2017 1:38 PM Performed by: Allean Found Pre-anesthesia Checklist: Patient identified, Emergency Drugs available, Suction available, Patient being monitored and Timeout performed Patient Re-evaluated:Patient Re-evaluated prior to inductionOxygen Delivery Method: Circle system utilized Preoxygenation: Pre-oxygenation with 100% oxygen Intubation Type: IV induction Ventilation: Mask ventilation without difficulty Laryngoscope Size: Mac and 3 Grade View: Grade I Tube type: Oral Tube size: 7.0 mm Number of attempts: 1 Airway Equipment and Method: Stylet Placement Confirmation: ETT inserted through vocal cords under direct vision,  positive ETCO2 and breath sounds checked- equal and bilateral Secured at: 21 cm Tube secured with: Tape Dental Injury: Teeth and Oropharynx as per pre-operative assessment

## 2017-02-07 NOTE — Anesthesia Post-op Follow-up Note (Cosign Needed)
Anesthesia QCDR form completed.        

## 2017-02-07 NOTE — H&P (Signed)
Reviewed paper H+P, will be scanned into chart. No changes noted.  

## 2017-02-08 ENCOUNTER — Encounter: Payer: Self-pay | Admitting: Orthopedic Surgery

## 2017-02-09 LAB — GLUCOSE, CAPILLARY: Glucose-Capillary: 140 mg/dL — ABNORMAL HIGH (ref 65–99)

## 2017-02-25 ENCOUNTER — Other Ambulatory Visit: Payer: Self-pay | Admitting: Internal Medicine

## 2017-02-25 LAB — HM DIABETES EYE EXAM

## 2017-03-02 ENCOUNTER — Encounter: Payer: Self-pay | Admitting: Internal Medicine

## 2017-03-02 ENCOUNTER — Ambulatory Visit (INDEPENDENT_AMBULATORY_CARE_PROVIDER_SITE_OTHER): Payer: BC Managed Care – PPO | Admitting: Internal Medicine

## 2017-03-02 VITALS — BP 142/74 | HR 81 | Temp 97.5°F | Resp 16 | Ht 65.0 in | Wt 234.1 lb

## 2017-03-02 DIAGNOSIS — E785 Hyperlipidemia, unspecified: Secondary | ICD-10-CM | POA: Diagnosis not present

## 2017-03-02 DIAGNOSIS — S52501S Unspecified fracture of the lower end of right radius, sequela: Secondary | ICD-10-CM

## 2017-03-02 DIAGNOSIS — E559 Vitamin D deficiency, unspecified: Secondary | ICD-10-CM

## 2017-03-02 DIAGNOSIS — E119 Type 2 diabetes mellitus without complications: Secondary | ICD-10-CM

## 2017-03-02 DIAGNOSIS — E1169 Type 2 diabetes mellitus with other specified complication: Secondary | ICD-10-CM | POA: Diagnosis not present

## 2017-03-02 NOTE — Progress Notes (Signed)
Subjective:  Patient ID: Cheryl Wallace, female    DOB: May 28, 1951  Age: 66 y.o. MRN: [id number]  CC: The primary encounter diagnosis was Vitamin D deficiency. Diagnoses of Diabetes mellitus without complication ([place]), Hyperlipidemia associated with type 2 diabetes mellitus ([place]), and Closed fracture of distal end of right radius, unspecified fracture morphology, sequela were also pertinent to this visit.  HPI Cheryl Wallace presents for 3 month follow up on diabetes.  Patient has Recently undergone  Right distral radius ORIF on June 4  For  An intra-articular fracture that occurred on may 21 While [place].  Patient tripped over an uneven part of the sidewalk.  Arm was casted in [place],  [place] upon return and underwent surgery on June 4  Has significant pain a ttimes. , when rotating the arm    no complaints today.  Patient is following a low glycemic index diet and taking all prescribed medications regularly without side effects.  Fasting sugars have been under less than 140 most of the time and post prandials have been under 160 except on rare occasions. Patient is exercising about 3 times per week and intentionally trying to lose weight .  Patient has had an eye exam in the last 12 months and checks feet regularly for signs of infection.  Patient does not walk barefoot outside,  And denies an numbness tingling or burning in feet. Patient is up to date on all recommended vaccinations  Lab Results  Component Value Date   HGBA1C 7.0 12/01/2016     Outpatient Medications Prior to Visit  Medication Sig Dispense Refill  . amLODipine (NORVASC) 5 MG tablet Take 1 tablet (5 mg total) by mouth daily. 90 tablet 0  . atorvastatin (LIPITOR) 20 MG tablet Take 1 tablet (20 mg total) by mouth daily. 90 tablet 0  . cholecalciferol (VITAMIN D) 1000 UNITS tablet Take 1,000 Units by mouth daily.     . famotidine (PEPCID) 20 MG tablet Take 1 tablet (20 mg total) by mouth 2 (two) times daily.  180 tablet 4  . fluticasone (FLONASE) 50 MCG/ACT nasal spray     . losartan (COZAAR) 50 MG tablet TAKE 1 TABLET (50 MG TOTAL) BY MOUTH DAILY. 90 tablet 1  . metFORMIN (GLUCOPHAGE) 500 MG tablet TAKE 2 TABLETS (1,000 MG TOTAL) BY MOUTH 2 (TWO) TIMES DAILY WITH A MEAL. 180 tablet 3  . zolpidem (AMBIEN) 5 MG tablet Take 1 tablet (5 mg total) by mouth at bedtime as needed. for sleep 30 tablet 5  . fluticasone (FLONASE) 50 MCG/ACT nasal spray Place 2 sprays into both nostrils daily. 16 g 4  . HYDROcodone-acetaminophen (NORCO) 5-325 MG tablet Take 1-2 tablets by mouth every 6 (six) hours as needed for moderate pain. (Patient not taking: Reported on 03/02/2017) 30 tablet 0  . zoster vaccine live, PF, (ZOSTAVAX) 37169 UNT/0.65ML injection Inject 19,400 Units into the skin once. (Patient not taking: Reported on 09/02/2016) 1 each 0   No facility-administered medications prior to visit.     Review of Systems;  Patient denies headache, fevers, malaise, unintentional weight loss, skin rash, eye pain, sinus congestion and sinus pain, sore throat, dysphagia,  hemoptysis , cough, dyspnea, wheezing, chest pain, palpitations, orthopnea, edema, abdominal pain, nausea, melena, diarrhea, constipation, flank pain, dysuria, hematuria, urinary  Frequency, nocturia, numbness, tingling, seizures,  Focal weakness, Loss of consciousness,  Tremor, insomnia, depression, anxiety, and suicidal ideation.      Objective:  BP (!) 142/74 (BP Location:  Left Arm, Patient Position: Sitting, Cuff Size: Large)   Pulse 81   Temp 97.5 F (36.4 C) (Oral)   Resp 16   Ht 5\' 5"  (1.651 m)   Wt 234 lb 1.9 oz (106.2 kg)   SpO2 97%   BMI 38.96 kg/m   BP Readings from Last 3 Encounters:  03/02/17 (!) 142/74  02/07/17 (!) 159/69  12/01/16 136/70    Wt Readings from Last 3 Encounters:  03/02/17 234 lb 1.9 oz (106.2 kg)  02/07/17 240 lb (108.9 kg)  12/01/16 235 lb 3.2 oz (106.7 kg)    General appearance: alert, cooperative and  appears stated age Ears: normal TM's and external ear canals both ears Throat: lips, mucosa, and tongue normal; teeth and gums normal Neck: no adenopathy, no carotid bruit, supple, symmetrical, trachea midline and thyroid not enlarged, symmetric, no tenderness/mass/nodules Back: symmetric, no curvature. ROM normal. No CVA tenderness. Lungs: clear to auscultation bilaterally Heart: regular rate and rhythm, S1, S2 normal, no murmur, click, rub or gallop Abdomen: soft, non-tender; bowel sounds normal; no masses,  no organomegaly Pulses: 2+ and symmetric Skin: Skin color, texture, turgor normal. No rashes or lesions Lymph nodes: Cervical, supraclavicular, and axillary nodes normal.  Lab Results  Component Value Date   HGBA1C 7.0 12/01/2016   HGBA1C 6.7 (H) 09/02/2016   HGBA1C 7.6 (H) 05/12/2016    Lab Results  Component Value Date   CREATININE 0.90 12/01/2016   CREATININE 0.86 09/02/2016   CREATININE 0.91 05/12/2016    Lab Results  Component Value Date   WBC 8.6 09/10/2015   HGB 10.3 (L) 09/10/2015   HCT 33.2 (L) 09/10/2015   PLT 287 09/10/2015   GLUCOSE 134 (H) 12/01/2016   CHOL 159 12/01/2016   TRIG 208.0 (H) 12/01/2016   HDL 41.20 12/01/2016   LDLDIRECT 85.0 12/01/2016   LDLCALC 105 (H) 03/12/2015   ALT 15 12/01/2016   AST 15 12/01/2016   NA 139 12/01/2016   K 4.5 12/01/2016   CL 104 12/01/2016   CREATININE 0.90 12/01/2016   BUN 15 12/01/2016   CO2 28 12/01/2016   TSH 2.44 03/20/2014   HGBA1C 7.0 12/01/2016   MICROALBUR 2.8 (H) 09/02/2016    Dg Wrist 2 Views Right  Result Date: 02/07/2017 CLINICAL DATA:  Postop right wrist . EXAM: RIGHT WRIST - 2 VIEW COMPARISON:  05/12/2016 . FINDINGS: Plate and screw fixation distal radius is noted. Anatomic alignment. Hardware intact. Soft tissue air noted most likely secondary to surgery. Patient is in a splint. IMPRESSION: Plate and screw fixation distal right radius. Anatomic alignment. Hardware intact. Electronically Signed    By: Marcello Moores  Register   On: 02/07/2017 14:56    Assessment & Plan:   Problem List Items Addressed This Visit    Hyperlipidemia associated with type 2 diabetes mellitus ([place])    Using the Framingham risk calculator,  her 10 year risk of coronary artery disease is 23%, she is tolerating atorvastatin.  Lab Results  Component Value Date   CHOL 159 12/01/2016   HDL 41.20 12/01/2016   LDLCALC 105 (H) 03/12/2015   LDLDIRECT 85.0 12/01/2016   TRIG 208.0 (H) 12/01/2016   CHOLHDL 4 12/01/2016         Diabetes mellitus without complication (HCC)    Slight loss of control comparatively,  But no changes to medications today.  will be advised to add baby aspirin and statin daily.    3 month follow up advised.   Lab Results  Component Value Date  HGBA1C 7.0 12/01/2016   Lab Results  Component Value Date   MICROALBUR 2.8 (H) 09/02/2016         Relevant Orders   Hemoglobin A1c   Comprehensive metabolic panel   Closed right radial fracture    Secondary to fall . S/p ORIF .  Advised to increase vitamin D to 3000 Ius daily .  DEXA scan to be done at age 60        Other Visit Diagnoses    Vitamin D deficiency    -  Primary   Relevant Orders   VITAMIN D 25 Hydroxy (Vit-D Deficiency, Fractures)     A total of 25 minutes of face to face time was spent with patient more than half of which was spent in counselling about the above mentioned conditions  and coordination of care   I have discontinued Ms. Cragle's zoster vaccine live (PF) and HYDROcodone-acetaminophen. I am also having her maintain her cholecalciferol, famotidine, fluticasone, fluticasone, amLODipine, zolpidem, atorvastatin, metFORMIN, and losartan.  No orders of the defined types were placed in this encounter.   Medications Discontinued During This Encounter  Medication Reason  . HYDROcodone-acetaminophen (NORCO) 5-325 MG tablet Patient has not taken in last 30 days  . zoster vaccine live, PF, (ZOSTAVAX) 77414  UNT/0.65ML injection Patient has not taken in last 30 days    Follow-up: Return in about 3 months (around 06/02/2017) for labs july 6.   Crecencio Mc, MD

## 2017-03-02 NOTE — Patient Instructions (Addendum)
Your last x ray showed that the bone is healing.   For your painL    Consider 400 mg ibuprofen every 8 hours as needed . Ok to add tylenol up to 2000 mg daily .  Continue 3000 IUs of D3 daily for bone healing   You need 1200 to 1800 mg calcium daily. Use supplements    tums is a great source , so is Almond and Soy milk.   Return next week or the week after for non fasting labs

## 2017-03-04 DIAGNOSIS — S5291XA Unspecified fracture of right forearm, initial encounter for closed fracture: Secondary | ICD-10-CM | POA: Insufficient documentation

## 2017-03-04 NOTE — Assessment & Plan Note (Signed)
Secondary to fall . S/p ORIF .  Advised to increase vitamin D to 3000 Ius daily .  DEXA scan to be done at age 66

## 2017-03-04 NOTE — Assessment & Plan Note (Addendum)
Using the Framingham risk calculator,  her 10 year risk of coronary artery disease is 23%, she is tolerating atorvastatin.  Lab Results  Component Value Date   CHOL 159 12/01/2016   HDL 41.20 12/01/2016   LDLCALC 105 (H) 03/12/2015   LDLDIRECT 85.0 12/01/2016   TRIG 208.0 (H) 12/01/2016   CHOLHDL 4 12/01/2016

## 2017-03-04 NOTE — Assessment & Plan Note (Signed)
Slight loss of control comparatively,  But no changes to medications today.  will be advised to add baby aspirin and statin daily.    3 month follow up advised.   Lab Results  Component Value Date   HGBA1C 7.0 12/01/2016   Lab Results  Component Value Date   MICROALBUR 2.8 (H) 09/02/2016

## 2017-03-11 ENCOUNTER — Other Ambulatory Visit (INDEPENDENT_AMBULATORY_CARE_PROVIDER_SITE_OTHER): Payer: BC Managed Care – PPO

## 2017-03-11 DIAGNOSIS — E559 Vitamin D deficiency, unspecified: Secondary | ICD-10-CM | POA: Diagnosis not present

## 2017-03-11 DIAGNOSIS — E119 Type 2 diabetes mellitus without complications: Secondary | ICD-10-CM

## 2017-03-11 LAB — COMPREHENSIVE METABOLIC PANEL
ALK PHOS: 92 U/L (ref 39–117)
ALT: 19 U/L (ref 0–35)
AST: 14 U/L (ref 0–37)
Albumin: 3.7 g/dL (ref 3.5–5.2)
BUN: 16 mg/dL (ref 6–23)
CHLORIDE: 106 meq/L (ref 96–112)
CO2: 27 meq/L (ref 19–32)
Calcium: 9.1 mg/dL (ref 8.4–10.5)
Creatinine, Ser: 0.97 mg/dL (ref 0.40–1.20)
GFR: 61.38 mL/min (ref >60.00)
GLUCOSE: 149 mg/dL — AB (ref 70–99)
POTASSIUM: 3.8 meq/L (ref 3.5–5.1)
Sodium: 140 mEq/L (ref 135–145)
Total Bilirubin: 0.4 mg/dL (ref 0.2–1.2)
Total Protein: 6.5 g/dL (ref 6.0–8.3)

## 2017-03-11 LAB — HEMOGLOBIN A1C: HEMOGLOBIN A1C: 7.8 % — AB (ref 4.6–6.5)

## 2017-03-11 LAB — VITAMIN D 25 HYDROXY (VIT D DEFICIENCY, FRACTURES): VITD: 61.59 ng/mL (ref 30.00–100.00)

## 2017-03-12 ENCOUNTER — Other Ambulatory Visit: Payer: Self-pay | Admitting: Internal Medicine

## 2017-03-13 ENCOUNTER — Encounter: Payer: Self-pay | Admitting: Internal Medicine

## 2017-03-13 ENCOUNTER — Other Ambulatory Visit: Payer: Self-pay | Admitting: Internal Medicine

## 2017-03-13 DIAGNOSIS — E119 Type 2 diabetes mellitus without complications: Secondary | ICD-10-CM

## 2017-03-13 MED ORDER — GLIPIZIDE 5 MG PO TABS: 2.5000 mg | ORAL_TABLET | Freq: Two times a day (BID) | ORAL | 2 refills | Status: DC

## 2017-03-13 NOTE — Assessment & Plan Note (Signed)
2.5 mg glipizide bid added for a1c 7.8

## 2017-03-18 ENCOUNTER — Other Ambulatory Visit: Payer: Self-pay | Admitting: Internal Medicine

## 2017-03-18 DIAGNOSIS — E1169 Type 2 diabetes mellitus with other specified complication: Secondary | ICD-10-CM

## 2017-03-18 DIAGNOSIS — E785 Hyperlipidemia, unspecified: Principal | ICD-10-CM

## 2017-03-28 NOTE — Telephone Encounter (Signed)
Mailed unread message to patient. thanks 

## 2017-04-08 ENCOUNTER — Other Ambulatory Visit: Payer: Self-pay | Admitting: Internal Medicine

## 2017-05-30 ENCOUNTER — Other Ambulatory Visit: Payer: Self-pay | Admitting: Internal Medicine

## 2017-06-14 ENCOUNTER — Encounter: Payer: Self-pay | Admitting: Internal Medicine

## 2017-06-14 ENCOUNTER — Ambulatory Visit (INDEPENDENT_AMBULATORY_CARE_PROVIDER_SITE_OTHER): Payer: BC Managed Care – PPO | Admitting: Internal Medicine

## 2017-06-14 VITALS — BP 144/66 | HR 89 | Temp 98.1°F | Resp 16 | Ht 65.0 in | Wt 238.8 lb

## 2017-06-14 DIAGNOSIS — I10 Essential (primary) hypertension: Secondary | ICD-10-CM

## 2017-06-14 DIAGNOSIS — E1169 Type 2 diabetes mellitus with other specified complication: Secondary | ICD-10-CM

## 2017-06-14 DIAGNOSIS — E1121 Type 2 diabetes mellitus with diabetic nephropathy: Secondary | ICD-10-CM

## 2017-06-14 DIAGNOSIS — Z6838 Body mass index (BMI) 38.0-38.9, adult: Secondary | ICD-10-CM | POA: Diagnosis not present

## 2017-06-14 DIAGNOSIS — E119 Type 2 diabetes mellitus without complications: Secondary | ICD-10-CM

## 2017-06-14 DIAGNOSIS — E785 Hyperlipidemia, unspecified: Secondary | ICD-10-CM

## 2017-06-14 DIAGNOSIS — E66812 Obesity, class 2: Secondary | ICD-10-CM

## 2017-06-14 DIAGNOSIS — Z23 Encounter for immunization: Secondary | ICD-10-CM

## 2017-06-14 NOTE — Patient Instructions (Signed)
increase the losartan dose to 100 mg daily .  Continue amlodipine 5 mg daily  We will  not change glipidize unless a1c is > 7.0    Here are several low carb protein bars that make great snacks and will keep your sugar from dropping when you skip lunch :   Power Crunch   5 g sugar  KIND 5 g sugar variety  Also commes in a 3 g 100 cal variety snack size  Quest  Bar 5 g sugar Atkins

## 2017-06-14 NOTE — Progress Notes (Signed)
Subjective:  Patient ID: Cheryl Wallace, female    DOB: [birth year]  Age: 66 y.o. MRN: [id number]  CC: The primary encounter diagnosis was Diabetes mellitus without complication ([place]). Diagnoses of Need for immunization against influenza, Essential hypertension, Type 2 diabetes mellitus with nephropathy ([place]), Hyperlipidemia associated with type 2 diabetes mellitus ([place]), and Class 2 severe obesity due to excess calories with serious comorbidity and body mass index (BMI) of 38.0 to 38.9 in adult [facility]) were also pertinent to this visit.  HPI CATELYNN SPARGER presents for follow up on type 2 DM, hyperlipidemia, obesity and hypertension   3 month follow up on diabetes.  Patient has no complaints today.  Patient is following a low glycemic index diet and taking all prescribed medications regularly but often forgets the afternoon dose of glipizide. Occasional lows in the mid afternoon caused by skipping lunch.    Fasting sugars have been under less than 140 most of the time and post prandials have been under 160 except on rare occasions. Patient is not exercising due to a painful heel spur but still ytrying to lose weight .  Patient has NOT had an eye exam in the last 12 months and checks feet regularly for signs of infection.  Patient does not walk barefoot outside,  And denies any numbness tingling or burning in feet. Patient is up to date on all recommended vaccinations  Has been wearing a boot on her right foot conservative treatment of  posterior and inferior heel spur off the calcaneous.seeing Todd cline for podiatry, with  plans for PT following boot trial. She wants to avoid Surgery, which  would require 3 months off feet.  She is Using  A scooter during the day ,  Takes the boot of to drive, shower and sleep .  Has been wearing the boot for 2 weeks.  pain is less severe.  Using meloxicam and Ice.    Still  recovering from a  Right wrist fracture in June that required  surgery .  Wrist has healed well   Thumb and fingers still tight   Has been receiving  Diclofenac, fluocinonide  And calcipotriene creams from a fraudulent mail order company .  Staff has checked into it and apparently the company has obtained my signature illegally.  Hypertension: patient checks blood pressure twice weekly at home.  Readings have been for the most part > 140/80 at rest . Patient is following a reduce salt diet most days and is taking medications as prescribed     Outpatient Medications Prior to Visit  Medication Sig Dispense Refill  . amLODipine (NORVASC) 5 MG tablet TAKE 1 TABLET (5 MG TOTAL) BY MOUTH DAILY. 90 tablet 0  . atorvastatin (LIPITOR) 20 MG tablet TAKE 1 TABLET (20 MG TOTAL) BY MOUTH DAILY. 90 tablet 1  . cholecalciferol (VITAMIN D) 1000 UNITS tablet Take 1,000 Units by mouth daily.     . famotidine (PEPCID) 20 MG tablet TAKE 1 TABLET (20 MG TOTAL) BY MOUTH 2 (TWO) TIMES DAILY. 180 tablet 2  . fluticasone (FLONASE) 50 MCG/ACT nasal spray     . glipiZIDE (GLUCOTROL) 5 MG tablet Take 0.5 tablets (2.5 mg total) by mouth 2 (two) times daily before a meal. 30 tablet 2  . losartan (COZAAR) 50 MG tablet TAKE 1 TABLET (50 MG TOTAL) BY MOUTH DAILY. 90 tablet 1  . metFORMIN (GLUCOPHAGE) 500 MG tablet TAKE 2 TABLETS (1,000 MG TOTAL) BY MOUTH 2 (TWO) TIMES DAILY WITH A MEAL. [place]  tablet 3  . zolpidem (AMBIEN) 5 MG tablet Take 1 tablet (5 mg total) by mouth at bedtime as needed. for sleep 30 tablet 5  . fluticasone (FLONASE) 50 MCG/ACT nasal spray PLACE 2 SPRAYS INTO BOTH NOSTRILS DAILY. 16 g 4   No facility-administered medications prior to visit.     Review of Systems;  Patient denies headache, fevers, malaise, unintentional weight loss, skin rash, eye pain, sinus congestion and sinus pain, sore throat, dysphagia,  hemoptysis , cough, dyspnea, wheezing, chest pain, palpitations, orthopnea, edema, abdominal pain, nausea, melena, diarrhea, constipation, flank pain, dysuria, hematuria, urinary  Frequency,  nocturia, numbness, tingling, seizures,  Focal weakness, Loss of consciousness,  Tremor, insomnia, depression, anxiety, and suicidal ideation.      Objective:  BP (!) 144/66 (BP Location: Left Arm, Patient Position: Sitting, Cuff Size: Large)   Pulse 89   Temp 98.1 F (36.7 C) (Oral)   Resp 16   Ht 5\' 5"  (1.651 m)   Wt 238 lb 12.8 oz (108.3 kg)   SpO2 96%   BMI 39.74 kg/m   BP Readings from Last 3 Encounters:  06/14/17 (!) 144/66  03/02/17 (!) 142/74  02/07/17 (!) 159/69    Wt Readings from Last 3 Encounters:  06/14/17 238 lb 12.8 oz (108.3 kg)  03/02/17 234 lb 1.9 oz (106.2 kg)  02/07/17 240 lb (108.9 kg)    General appearance: alert, cooperative and appears stated age Ears: normal TM's and external ear canals both ears Throat: lips, mucosa, and tongue normal; teeth and gums normal Neck: no adenopathy, no carotid bruit, supple, symmetrical, trachea midline and thyroid not enlarged, symmetric, no tenderness/mass/nodules Back: symmetric, no curvature. ROM normal. No CVA tenderness. Lungs: clear to auscultation bilaterally Heart: regular rate and rhythm, S1, S2 normal, no murmur, click, rub or gallop Abdomen: soft, non-tender; bowel sounds normal; no masses,  no organomegaly Pulses: 2+ and symmetric Skin: Skin color, texture, turgor normal. No rashes or lesions Lymph nodes: Cervical, supraclavicular, and axillary nodes normal.  Lab Results  Component Value Date   HGBA1C 7.4 (H) 06/14/2017   HGBA1C 7.8 (H) 03/11/2017   HGBA1C 7.0 12/01/2016    Lab Results  Component Value Date   CREATININE 0.97 06/14/2017   CREATININE 0.97 03/11/2017   CREATININE 0.90 12/01/2016    Lab Results  Component Value Date   WBC 8.6 09/10/2015   HGB 10.3 (L) 09/10/2015   HCT 33.2 (L) 09/10/2015   PLT 287 09/10/2015   GLUCOSE 99 06/14/2017   CHOL 117 06/14/2017   TRIG 138.0 06/14/2017   HDL 44.40 06/14/2017   LDLDIRECT 85.0 12/01/2016   LDLCALC 45 06/14/2017   ALT 17 06/14/2017     AST 17 06/14/2017   NA 138 06/14/2017   K 4.7 06/14/2017   CL 102 06/14/2017   CREATININE 0.97 06/14/2017   BUN 16 06/14/2017   CO2 27 06/14/2017   TSH 2.44 03/20/2014   HGBA1C 7.4 (H) 06/14/2017   MICROALBUR 2.8 (H) 09/02/2016    Dg Wrist 2 Views Right  Result Date: 02/07/2017 CLINICAL DATA:  Postop right wrist . EXAM: RIGHT WRIST - 2 VIEW COMPARISON:  05/12/2016 . FINDINGS: Plate and screw fixation distal radius is noted. Anatomic alignment. Hardware intact. Soft tissue air noted most likely secondary to surgery. Patient is in a splint. IMPRESSION: Plate and screw fixation distal right radius. Anatomic alignment. Hardware intact. Electronically Signed   By: Marcello Moores  Register   On: 02/07/2017 14:56    Assessment & Plan:  Problem List Items Addressed This Visit    Essential hypertension    Not at goal  Increasing losartan to 100 mg daily,  Continue amlodipine 5 mg daily.  Lab Results  Component Value Date   NA 138 06/14/2017   K 4.7 06/14/2017   CL 102 06/14/2017   CO2 27 06/14/2017   Lab Results  Component Value Date   CREATININE 0.97 06/14/2017         Hyperlipidemia associated with type 2 diabetes mellitus ([place])    Using the Framingham risk calculator,  her 10 year risk of coronary artery disease is 23%, she is tolerating atorvastatin. lfts are normal  Lab Results  Component Value Date   CHOL 117 06/14/2017   HDL 44.40 06/14/2017   LDLCALC 45 06/14/2017   LDLDIRECT 85.0 12/01/2016   TRIG 138.0 06/14/2017   CHOLHDL 3 06/14/2017   Lab Results  Component Value Date   ALT 17 06/14/2017   AST 17 06/14/2017   ALKPHOS 97 06/14/2017   BILITOT 0.3 06/14/2017         Obesity    I have cautioned  her  About her weight gain and encouraged  Continued efforts with goal of 10% of body weigh over the next 6 months using a low glycemic index diet and regular exercise (when her orthopedic issues allow) .        Type 2 diabetes mellitus with nephropathy (HCC) -  Primary    Improving control.  Reminder for annual diabetic eye exam given..  Foot exam done. Meds reviewed and she is on a baby aspirin daily., a statin and an ACE inhibitor.  Urine tested for protein.   Lab Results  Component Value Date   MICROALBUR 2.8 (H) 09/02/2016   Lab Results  Component Value Date   HGBA1C 7.4 (H) 06/14/2017          Other Visit Diagnoses    Need for immunization against influenza       Relevant Orders   Flu Vaccine QUAD 36+ mos IM (Completed)      I am having Ms. Locust maintain her cholecalciferol, fluticasone, zolpidem, metFORMIN, losartan, amLODipine, glipiZIDE, atorvastatin, fluticasone, and famotidine.  No orders of the defined types were placed in this encounter.   There are no discontinued medications.  Follow-up: No Follow-up on file.   Crecencio Mc, MD

## 2017-06-15 LAB — COMPREHENSIVE METABOLIC PANEL
ALBUMIN: 3.9 g/dL (ref 3.5–5.2)
ALK PHOS: 97 U/L (ref 39–117)
ALT: 17 U/L (ref 0–35)
AST: 17 U/L (ref 0–37)
BUN: 16 mg/dL (ref 6–23)
CALCIUM: 9.9 mg/dL (ref 8.4–10.5)
CHLORIDE: 102 meq/L (ref 96–112)
CO2: 27 mEq/L (ref 19–32)
Creatinine, Ser: 0.97 mg/dL (ref 0.40–1.20)
GFR: 61.33 mL/min (ref >60.00)
GLUCOSE: 99 mg/dL (ref 70–99)
Potassium: 4.7 mEq/L (ref 3.5–5.1)
Sodium: 138 mEq/L (ref 135–145)
Total Bilirubin: 0.3 mg/dL (ref 0.2–1.2)
Total Protein: 6.9 g/dL (ref 6.0–8.3)

## 2017-06-15 LAB — LIPID PANEL
CHOLESTEROL: 117 mg/dL (ref 0–200)
HDL: 44.4 mg/dL (ref >39.00)
LDL CALC: 45 mg/dL (ref 0–99)
NonHDL: 72.72
Total CHOL/HDL Ratio: 3
Triglycerides: 138 mg/dL (ref 0.0–149.0)
VLDL: 27.6 mg/dL (ref 0.0–40.0)

## 2017-06-15 LAB — HEMOGLOBIN A1C: Hgb A1c MFr Bld: 7.4 % — ABNORMAL HIGH (ref 4.6–6.5)

## 2017-06-16 ENCOUNTER — Encounter: Payer: Self-pay | Admitting: Internal Medicine

## 2017-06-16 NOTE — Assessment & Plan Note (Signed)
Improving control.  Reminder for annual diabetic eye exam given..  Foot exam done. Meds reviewed and she is on a baby aspirin daily., a statin and an ACE inhibitor.  Urine tested for protein.   Lab Results  Component Value Date   MICROALBUR 2.8 (H) 09/02/2016   Lab Results  Component Value Date   HGBA1C 7.4 (H) 06/14/2017

## 2017-06-16 NOTE — Assessment & Plan Note (Signed)
Not at goal  Increasing losartan to 100 mg daily,  Continue amlodipine 5 mg daily.  Lab Results  Component Value Date   NA 138 06/14/2017   K 4.7 06/14/2017   CL 102 06/14/2017   CO2 27 06/14/2017   Lab Results  Component Value Date   CREATININE 0.97 06/14/2017

## 2017-06-16 NOTE — Assessment & Plan Note (Signed)
Using the Framingham risk calculator,  her 10 year risk of coronary artery disease is 23%, she is tolerating atorvastatin. lfts are normal  Lab Results  Component Value Date   CHOL 117 06/14/2017   HDL 44.40 06/14/2017   LDLCALC 45 06/14/2017   LDLDIRECT 85.0 12/01/2016   TRIG 138.0 06/14/2017   CHOLHDL 3 06/14/2017   Lab Results  Component Value Date   ALT 17 06/14/2017   AST 17 06/14/2017   ALKPHOS 97 06/14/2017   BILITOT 0.3 06/14/2017

## 2017-06-16 NOTE — Assessment & Plan Note (Addendum)
I have cautioned  her  About her weight gain and encouraged  Continued efforts with goal of 10% of body weigh over the next 6 months using a low glycemic index diet and regular exercise (when her orthopedic issues allow) .

## 2017-06-26 ENCOUNTER — Other Ambulatory Visit: Payer: Self-pay | Admitting: Internal Medicine

## 2017-07-03 ENCOUNTER — Other Ambulatory Visit: Payer: Self-pay | Admitting: Internal Medicine

## 2017-07-04 ENCOUNTER — Other Ambulatory Visit: Payer: Self-pay | Admitting: Internal Medicine

## 2017-07-10 ENCOUNTER — Encounter: Payer: Self-pay | Admitting: Internal Medicine

## 2017-07-11 MED ORDER — LOSARTAN POTASSIUM 50 MG PO TABS: ORAL_TABLET | ORAL | 1 refills | Status: DC

## 2017-08-09 ENCOUNTER — Other Ambulatory Visit: Payer: Self-pay

## 2017-08-09 MED ORDER — GLIPIZIDE 5 MG PO TABS: ORAL_TABLET | ORAL | 5 refills | Status: DC

## 2017-08-25 ENCOUNTER — Other Ambulatory Visit: Payer: Self-pay | Admitting: Internal Medicine

## 2017-08-26 NOTE — Telephone Encounter (Signed)
Okay to refill? 

## 2017-09-03 ENCOUNTER — Other Ambulatory Visit: Payer: Self-pay | Admitting: Internal Medicine

## 2017-09-13 ENCOUNTER — Telehealth: Payer: Self-pay

## 2017-09-13 ENCOUNTER — Other Ambulatory Visit: Payer: Self-pay | Admitting: Internal Medicine

## 2017-09-13 NOTE — Telephone Encounter (Signed)
Letter written

## 2017-09-13 NOTE — Telephone Encounter (Signed)
Cheryl Wallace from [facility] called regarding an audit for two patient from the office. He needs clarification on rx's that were sent.  [phone].

## 2017-09-13 NOTE — Telephone Encounter (Signed)
Spoke with Altamese Dilling from [facility] and he stated that their pharmacy is being audited and he is needing a letter on a letterhead stating that you did sign the rx for the following medications: Fluocinonide cream .1%, calcipotrien cream .005%, and synerdern.

## 2017-09-13 NOTE — Telephone Encounter (Signed)
Letter has been faxed.

## 2017-09-22 ENCOUNTER — Other Ambulatory Visit: Payer: Self-pay

## 2017-09-22 ENCOUNTER — Telehealth: Payer: Self-pay | Admitting: Internal Medicine

## 2017-09-22 DIAGNOSIS — E785 Hyperlipidemia, unspecified: Principal | ICD-10-CM

## 2017-09-22 DIAGNOSIS — E1169 Type 2 diabetes mellitus with other specified complication: Secondary | ICD-10-CM

## 2017-09-22 MED ORDER — ATORVASTATIN CALCIUM 20 MG PO TABS: 20.0000 mg | ORAL_TABLET | Freq: Every day | ORAL | 1 refills | Status: DC

## 2017-09-22 NOTE — Telephone Encounter (Signed)
Rx request- not on current list

## 2017-09-22 NOTE — Telephone Encounter (Signed)
Spoke with pt and she stated that she did not request this medication. Called the pharmacy that keeps requesting this and explained to them that the pt has never ordered this medication nor does she need this medication. Pharmacy stated that they would take her off the auto-refill and not send [place] the fax anymore.

## 2017-09-22 NOTE — Telephone Encounter (Signed)
Copied from [place] [phone]. Topic: Quick Communication - Rx Refill/Question >> Sep 22, 2017  8:24 AM Marin Olp L wrote: Medication: Diclofanac sodium 1.5% Has the patient contacted their pharmacy? Yes.   (Agent: If no, request that the patient contact the pharmacy for the refill.) Preferred Pharmacy (with phone number or street name): [place] Agent: Please be advised that RX refills may take up to 3 business days. We ask that you follow-up with your pharmacy.

## 2017-09-22 NOTE — Telephone Encounter (Signed)
Please advise 

## 2017-09-26 ENCOUNTER — Ambulatory Visit: Payer: BC Managed Care – PPO | Admitting: Internal Medicine

## 2017-09-26 ENCOUNTER — Encounter: Payer: Self-pay | Admitting: Internal Medicine

## 2017-09-26 VITALS — BP 130/74 | HR 94 | Temp 97.6°F | Resp 16 | Ht 65.0 in | Wt 244.2 lb

## 2017-09-26 DIAGNOSIS — E1169 Type 2 diabetes mellitus with other specified complication: Secondary | ICD-10-CM

## 2017-09-26 DIAGNOSIS — Z79899 Other long term (current) drug therapy: Secondary | ICD-10-CM | POA: Diagnosis not present

## 2017-09-26 DIAGNOSIS — G7241 Inclusion body myositis [IBM]: Secondary | ICD-10-CM

## 2017-09-26 DIAGNOSIS — R5383 Other fatigue: Secondary | ICD-10-CM | POA: Diagnosis not present

## 2017-09-26 DIAGNOSIS — E785 Hyperlipidemia, unspecified: Secondary | ICD-10-CM | POA: Diagnosis not present

## 2017-09-26 DIAGNOSIS — E1121 Type 2 diabetes mellitus with diabetic nephropathy: Secondary | ICD-10-CM

## 2017-09-26 DIAGNOSIS — M2550 Pain in unspecified joint: Secondary | ICD-10-CM | POA: Diagnosis not present

## 2017-09-26 LAB — LIPID PANEL
CHOL/HDL RATIO: 2
Cholesterol: 107 mg/dL (ref 0–200)
HDL: 43.1 mg/dL (ref >39.00)
LDL Cholesterol: 35 mg/dL (ref 0–99)
NONHDL: 63.62
TRIGLYCERIDES: 143 mg/dL (ref 0.0–149.0)
VLDL: 28.6 mg/dL (ref 0.0–40.0)

## 2017-09-26 LAB — COMPREHENSIVE METABOLIC PANEL
ALBUMIN: 3.8 g/dL (ref 3.5–5.2)
ALK PHOS: 92 U/L (ref 39–117)
ALT: 15 U/L (ref 0–35)
AST: 15 U/L (ref 0–37)
BUN: 18 mg/dL (ref 6–23)
CO2: 27 mEq/L (ref 19–32)
CREATININE: 0.93 mg/dL (ref 0.40–1.20)
Calcium: 9 mg/dL (ref 8.4–10.5)
Chloride: 104 mEq/L (ref 96–112)
GFR: 64.33 mL/min (ref >60.00)
Glucose, Bld: 135 mg/dL — ABNORMAL HIGH (ref 70–99)
POTASSIUM: 3.9 meq/L (ref 3.5–5.1)
SODIUM: 139 meq/L (ref 135–145)
TOTAL PROTEIN: 6.4 g/dL (ref 6.0–8.3)
Total Bilirubin: 0.5 mg/dL (ref 0.2–1.2)

## 2017-09-26 LAB — SEDIMENTATION RATE: SED RATE: 22 mm/h (ref 0–30)

## 2017-09-26 LAB — HEMOGLOBIN A1C: Hgb A1c MFr Bld: 7.6 % — ABNORMAL HIGH (ref 4.6–6.5)

## 2017-09-26 LAB — VITAMIN B12: Vitamin B-12: 246 pg/mL (ref 211–911)

## 2017-09-26 LAB — LDL CHOLESTEROL, DIRECT: LDL DIRECT: 52 mg/dL

## 2017-09-26 MED ORDER — TRAMADOL HCL 50 MG PO TABS: 50.0000 mg | ORAL_TABLET | Freq: Three times a day (TID) | ORAL | 5 refills | Status: DC | PRN

## 2017-09-26 NOTE — Progress Notes (Signed)
Subjective:.    Patient ID: Cheryl Wallace, female    DOB: 10/04/1950  Age: 67 y.o. 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 Diagnoses of Polyarthralgia, Hyperlipidemia associated with type 2 diabetes mellitus ([place]), Type 2 diabetes mellitus with nephropathy ([place]), Long-term use of high-risk medication, and Fatigue, unspecified type were also pertinent to this visit.  HPI Cheryl Wallace presents for follow up on type 2 DM complicated by obesity, hypertension and hyperlipidemia   Having a lot of foot pain due to a bone spur on her achilles tendon Cleda Mccreedy managing), but also having polyarthralgia despite using etodolac. May need foot surgery getting PT for achilles tendon.  Both shoulders hurt  Both IT bands hurt.  Has DJD left shoulder by 2015  X rays   Treated for sinusitis with z pack on Nov 19,  Followed by doxycycline on Nov 26 for persistent  non productive cough without fevers. Still doesn't feel well  Bu cough has resolved.  Cc  Sinus congestion ,  Using flonase and claritin.    Had GI bug last week and sinuses felt better for a while  DM:  Blood sugars random averaging 125   But were 225 when she was sick.     Lab Results  Component Value Date   HGBA1C 7.6 (H) 09/26/2017     Outpatient Medications Prior to Visit  Medication Sig Dispense Refill  . amLODipine (NORVASC) 5 MG tablet TAKE 1 TABLET (5 MG TOTAL) BY MOUTH DAILY. 90 tablet 0  . atorvastatin (LIPITOR) 20 MG tablet Take 1 tablet (20 mg total) by mouth daily. 90 tablet 1  . cholecalciferol (VITAMIN D) 1000 UNITS tablet Take 1,000 Units by mouth daily.     . famotidine (PEPCID) 20 MG tablet TAKE 1 TABLET (20 MG TOTAL) BY MOUTH 2 (TWO) TIMES DAILY. 180 tablet 2  . glipiZIDE (GLUCOTROL) 5 MG tablet TAKE 1/2 TABLETS (2.5 MG TOTAL) BY MOUTH 2 (TWO) TIMES DAILY BEFORE A MEAL. 30 tablet 5  . losartan (COZAAR) 50 MG tablet TAKE 2 TABLET (50 MG TOTAL) BY MOUTH DAILY. 180 tablet 1  .  metFORMIN (GLUCOPHAGE) 500 MG tablet TAKE 2 TABLETS (1,000 MG TOTAL) BY MOUTH 2 (TWO) TIMES DAILY WITH A MEAL. 180 tablet 3  . zolpidem (AMBIEN) 5 MG tablet TAKE ONE TABLET BY MOUTH AT BEDTIME AS NEEDED FOR SLEEP 30 tablet 1  . etodolac (LODINE) 500 MG tablet Take 500 mg by mouth 2 (two) times daily.  1  . fluticasone (FLONASE) 50 MCG/ACT nasal spray PLACE 2 SPRAYS INTO BOTH NOSTRILS DAILY. 16 g 4  . fluticasone (FLONASE) 50 MCG/ACT nasal spray     . losartan (COZAAR) 50 MG tablet TAKE 1 TABLET (50 MG TOTAL) BY MOUTH DAILY. (Patient not taking: Reported on 09/26/2017) 90 tablet 0   No facility-administered medications prior to visit.     Review of Systems;  Patient denies headache, fevers, malaise, unintentional weight loss, skin rash, eye pain, sinus congestion and sinus pain, sore throat, dysphagia,  hemoptysis , cough, dyspnea, wheezing, chest pain, palpitations, orthopnea, edema, abdominal pain, nausea, melena, diarrhea, constipation, flank pain, dysuria, hematuria, urinary  Frequency, nocturia, numbness, tingling, seizures,  Focal weakness, Loss of consciousness,  Tremor, insomnia, depression, anxiety, and suicidal ideation.      Objective:  BP 130/74 (BP Location: Right Arm, Patient Position: Sitting, Cuff Size: Large)   Pulse 94   Temp 97.6 F (36.4 C) (Oral)   Resp 16  Ht 5\' 5"  (1.651 m)   Wt 244 lb 3.2 oz (110.8 kg)   SpO2 95%   BMI 40.64 kg/m   BP Readings from Last 3 Encounters:  09/26/17 130/74  06/14/17 (!) 144/66  03/02/17 (!) 142/74    Wt Readings from Last 3 Encounters:  09/26/17 244 lb 3.2 oz (110.8 kg)  06/14/17 238 lb 12.8 oz (108.3 kg)  03/02/17 234 lb 1.9 oz (106.2 kg)    General appearance: alert, cooperative and appears stated age Ears: normal TM's and external ear canals both ears Throat: lips, mucosa, and tongue normal; teeth and gums normal Neck: no adenopathy, no carotid bruit, supple, symmetrical, trachea midline and thyroid not enlarged,  symmetric, no tenderness/mass/nodules Back: symmetric, no curvature. ROM normal. No CVA tenderness. Lungs: clear to auscultation bilaterally Heart: regular rate and rhythm, S1, S2 normal, no murmur, click, rub or gallop Abdomen: soft, non-tender; bowel sounds normal; no masses,  no organomegaly Pulses: 2+ and symmetric Skin: Skin color, texture, turgor normal. No rashes or lesions Lymph nodes: Cervical, supraclavicular, and axillary nodes normal.  Lab Results  Component Value Date   HGBA1C 7.6 (H) 09/26/2017   HGBA1C 7.4 (H) 06/14/2017   HGBA1C 7.8 (H) 03/11/2017    Lab Results  Component Value Date   CREATININE 0.93 09/26/2017   CREATININE 0.97 06/14/2017   CREATININE 0.97 03/11/2017    Lab Results  Component Value Date   WBC 8.6 09/10/2015   HGB 10.3 (L) 09/10/2015   HCT 33.2 (L) 09/10/2015   PLT 287 09/10/2015   GLUCOSE 135 (H) 09/26/2017   CHOL 107 09/26/2017   TRIG 143.0 09/26/2017   HDL 43.10 09/26/2017   LDLDIRECT 52.0 09/26/2017   LDLCALC 35 09/26/2017   ALT 15 09/26/2017   AST 15 09/26/2017   NA 139 09/26/2017   K 3.9 09/26/2017   CL 104 09/26/2017   CREATININE 0.93 09/26/2017   BUN 18 09/26/2017   CO2 27 09/26/2017   TSH 2.44 03/20/2014   HGBA1C 7.6 (H) 09/26/2017   MICROALBUR 2.8 (H) 09/02/2016    Dg Wrist 2 Views Right  Result Date: 02/07/2017 CLINICAL DATA:  Postop right wrist . EXAM: RIGHT WRIST - 2 VIEW COMPARISON:  05/12/2016 . FINDINGS: Plate and screw fixation distal radius is noted. Anatomic alignment. Hardware intact. Soft tissue air noted most likely secondary to surgery. Patient is in a splint. IMPRESSION: Plate and screw fixation distal right radius. Anatomic alignment. Hardware intact. Electronically Signed   By: Marcello Moores  Register   On: 02/07/2017 14:56    Assessment & Plan:   Problem List Items Addressed This Visit    Inclusion body myositis - Primary   Hyperlipidemia associated with type 2 diabetes mellitus ([place])    Using the  [place] risk calculator,  her 10 year risk of coronary artery disease is 23%, she is tolerating atorvastatin. lfts are normal  Lab Results  Component Value Date   CHOL 107 09/26/2017   HDL 43.10 09/26/2017   LDLCALC 35 09/26/2017   LDLDIRECT 52.0 09/26/2017   TRIG 143.0 09/26/2017   CHOLHDL 2 09/26/2017   Lab Results  Component Value Date   ALT 15 09/26/2017   AST 15 09/26/2017   ALKPHOS 92 09/26/2017   BILITOT 0.5 09/26/2017         Relevant Orders   Lipid panel (Completed)   LDL cholesterol, direct (Completed)   Polyarthralgia     No signs of inflammatory arthritis on exam today. Continue etodolac, tylenol and tramadol prn moderate  pain  Lab Results  Component Value Date   ESRSEDRATE 22 09/26/2017         Relevant Orders   Sedimentation rate (Completed)   Type 2 diabetes mellitus with nephropathy (H[facility])    Loss of  control.  Reminder for annual diabetic eye exam given..  Foot exam done. Meds reviewed and she is on a baby aspirin daily., a statin and an ACE inhibitor.  Urine tested for protein.  Will increase metformin to 850 mg bid and recommend victoza. Bid.   Lab Results  Component Value Date   MICROALBUR 2.8 (H) 09/02/2016   Lab Results  Component Value Date   HGBA1C 7.6 (H) 09/26/2017         Relevant Orders   Hemoglobin A1c (Completed)    Other Visit Diagnoses    Long-term use of high-risk medication       Relevant Orders   Comprehensive metabolic panel (Completed)   B12 (Completed)   Fatigue, unspecified type       Relevant Orders   HIV antibody (Completed)      I am having Leeah F. Bodkins start on traMADol. I am also having her maintain her cholecalciferol, fluticasone, famotidine, amLODipine, metFORMIN, losartan, glipiZIDE, zolpidem, atorvastatin, and etodolac.  Meds ordered this encounter  Medications  . traMADol (ULTRAM) 50 MG tablet    Sig: Take 1 tablet (50 mg total) by mouth every 8 (eight) hours as needed.    Dispense:  60 tablet      Refill:  5    Medications Discontinued During This Encounter  Medication Reason  . fluticasone (FLONASE) 50 MCG/ACT nasal spray Duplicate  . losartan (COZAAR) 50 MG tablet Duplicate    Follow-up: Return in about 3 months (around 12/25/2017) for follow up diabetes.   Crecencio Mc, MD

## 2017-09-26 NOTE — Patient Instructions (Addendum)
I want you to Add tylenol , up to 2000 mg daily  To your etodolac , for pain control.  You can add the tramadol at night to help you rest  For your persistent sinus congestion  Suspend the claritin to see if you have allergy symptoms without it    You should try [facility]'s Sinus rinse ;  It is a stong sinus "flush" using water and medicated salts.  Do it over the sink because it can be a bit messy

## 2017-09-27 ENCOUNTER — Encounter: Payer: Self-pay | Admitting: Internal Medicine

## 2017-09-27 LAB — HIV ANTIBODY (ROUTINE TESTING W REFLEX): HIV: NONREACTIVE

## 2017-09-27 MED ORDER — METFORMIN HCL 1000 MG PO TABS: 1000.0000 mg | ORAL_TABLET | Freq: Two times a day (BID) | ORAL | 3 refills | Status: DC

## 2017-09-27 MED ORDER — GLIPIZIDE 5 MG PO TABS: ORAL_TABLET | ORAL | 5 refills | Status: DC

## 2017-09-27 NOTE — Assessment & Plan Note (Signed)
No signs of inflammatory arthritis on exam today. Continue etodolac, tylenol and tramadol prn moderate pain  Lab Results  Component Value Date   ESRSEDRATE 22 09/26/2017

## 2017-09-27 NOTE — Assessment & Plan Note (Addendum)
Loss of  control.  Reminder for annual diabetic eye exam given..  Foot exam done. Meds reviewed and she is on a baby aspirin daily., a statin and an ACE inhibitor.  Urine tested for protein.  Will increase  Glipizide to 5 mg in the am and continue 2.5 mg at night. continue metformin 1000 mg bid.   Lab Results  Component Value Date   MICROALBUR 2.8 (H) 09/02/2016   Lab Results  Component Value Date   HGBA1C 7.6 (H) 09/26/2017

## 2017-09-27 NOTE — Assessment & Plan Note (Signed)
Using the Framingham risk calculator,  her 10 year risk of coronary artery disease is 23%, she is tolerating atorvastatin. lfts are normal  Lab Results  Component Value Date   CHOL 107 09/26/2017   HDL 43.10 09/26/2017   LDLCALC 35 09/26/2017   LDLDIRECT 52.0 09/26/2017   TRIG 143.0 09/26/2017   CHOLHDL 2 09/26/2017   Lab Results  Component Value Date   ALT 15 09/26/2017   AST 15 09/26/2017   ALKPHOS 92 09/26/2017   BILITOT 0.5 09/26/2017

## 2017-10-04 ENCOUNTER — Encounter: Payer: Self-pay | Admitting: Internal Medicine

## 2017-10-12 ENCOUNTER — Encounter: Payer: Self-pay | Admitting: Internal Medicine

## 2017-10-14 ENCOUNTER — Telehealth: Payer: Self-pay

## 2017-10-14 NOTE — Telephone Encounter (Signed)
PA for tramadol has been submitted and approved. Both pt and pharmacy have been notified.

## 2017-10-23 ENCOUNTER — Other Ambulatory Visit: Payer: Self-pay | Admitting: Internal Medicine

## 2017-10-31 ENCOUNTER — Other Ambulatory Visit: Payer: Self-pay | Admitting: Internal Medicine

## 2017-11-01 NOTE — Telephone Encounter (Signed)
Printed, signed and faxed.  

## 2017-11-01 NOTE — Telephone Encounter (Signed)
Refilled: 10/24/2017 Last OV: 09/26/2017 Next OV: 12/29/2017

## 2017-11-14 ENCOUNTER — Other Ambulatory Visit: Payer: Self-pay | Admitting: Internal Medicine

## 2017-12-08 ENCOUNTER — Other Ambulatory Visit: Payer: Self-pay | Admitting: Internal Medicine

## 2017-12-22 ENCOUNTER — Other Ambulatory Visit: Payer: Self-pay | Admitting: Internal Medicine

## 2017-12-29 ENCOUNTER — Ambulatory Visit: Payer: BC Managed Care – PPO | Admitting: Internal Medicine

## 2018-01-02 ENCOUNTER — Ambulatory Visit: Payer: BC Managed Care – PPO | Admitting: Internal Medicine

## 2018-01-02 ENCOUNTER — Other Ambulatory Visit: Payer: Self-pay | Admitting: Internal Medicine

## 2018-01-02 ENCOUNTER — Encounter: Payer: Self-pay | Admitting: Internal Medicine

## 2018-01-02 ENCOUNTER — Telehealth: Payer: Self-pay | Admitting: Internal Medicine

## 2018-01-02 VITALS — BP 130/68 | HR 85 | Temp 97.8°F | Resp 15 | Ht 65.0 in | Wt 242.6 lb

## 2018-01-02 DIAGNOSIS — E1169 Type 2 diabetes mellitus with other specified complication: Secondary | ICD-10-CM

## 2018-01-02 DIAGNOSIS — E785 Hyperlipidemia, unspecified: Secondary | ICD-10-CM | POA: Diagnosis not present

## 2018-01-02 DIAGNOSIS — Z1239 Encounter for other screening for malignant neoplasm of breast: Secondary | ICD-10-CM

## 2018-01-02 DIAGNOSIS — Z1231 Encounter for screening mammogram for malignant neoplasm of breast: Secondary | ICD-10-CM | POA: Diagnosis not present

## 2018-01-02 DIAGNOSIS — E2839 Other primary ovarian failure: Secondary | ICD-10-CM | POA: Diagnosis not present

## 2018-01-02 DIAGNOSIS — E1121 Type 2 diabetes mellitus with diabetic nephropathy: Secondary | ICD-10-CM | POA: Diagnosis not present

## 2018-01-02 DIAGNOSIS — Z23 Encounter for immunization: Secondary | ICD-10-CM

## 2018-01-02 DIAGNOSIS — I10 Essential (primary) hypertension: Secondary | ICD-10-CM

## 2018-01-02 NOTE — Telephone Encounter (Signed)
Copied from [place] [phone]. Topic: Quick Communication - See Telephone Encounter >> Jan 02, 2018  2:59 PM Cleaster Corin, NT wrote: CRM for notification. See Telephone encounter for: 01/02/18.  Ronald calling from [facility] letting Dr. Derrel Nip know that he is sending fax over for a new rx. For pt. (stated that there has been several attempts with sending fax over)

## 2018-01-02 NOTE — Patient Instructions (Addendum)
Depending on your A1c today,  We may increase your glipizide dose in the morning and aevening  Make sure you keep a snack handy for late morning   Mammogram, DEXA scan ordered    The ShingRx vaccine is now available in local pharmacies and is much more protective thant Zostavaxs,  It is therefore ADVISED for all interested adults over 50 to prevent shingles .  If your blood test for Chicken pox exposure is positive,  You should get the  vaccine

## 2018-01-02 NOTE — Progress Notes (Signed)
Subjective:  Patient ID: Cheryl Wallace, female    DOB: 18-Dec-1950  Age: 67 y.o. MRN: [id number]  CC: The primary encounter diagnosis was Type 2 diabetes mellitus with nephropathy ([place]). Diagnoses of Need for shingles vaccine, Breast cancer screening, Estrogen deficiency, Hyperlipidemia associated with type 2 diabetes mellitus ([place]), and Essential hypertension were also pertinent to this visit.  HPI Cheryl Wallace presents for 3 month follow up on diabetes, HYPERTENSION,  OBESITY ([place])    .  Patient has no complaints today.  Patient is following a low glycemic index diet and taking all prescribed medications regularly without side effects.  Fasting sugars have been  Around 150 most of the time both fasting and 1 hour post prandials  Patient is not exercising about 3 times per week and intentionally trying to lose weight .  Patient has had an eye exam in the last 12 months and checks feet regularly for signs of infection.  Patient does not walk barefoot outside,  And denies an numbness tingling or burning in feet. Patient is up to date on all recommended vaccinations  Lab Results  Component Value Date   MICROALBUR 1.0 01/02/2018   Diabetic eye exam scheduled for June 18  FOOT PAIN has calmed down with PT . Now over  Achilles tendinopathy better      Outpatient Medications Prior to Visit  Medication Sig Dispense Refill  . amLODipine (NORVASC) 5 MG tablet TAKE 1 TABLET (5 MG TOTAL) BY MOUTH DAILY. 90 tablet 1  . atorvastatin (LIPITOR) 20 MG tablet Take 1 tablet (20 mg total) by mouth daily. 90 tablet 1  . cholecalciferol (VITAMIN D) 1000 UNITS tablet Take 1,000 Units by mouth daily.     [name] etodolac (LODINE) 500 MG tablet Take 500 mg by mouth 2 (two) times daily.  1  . famotidine (PEPCID) 20 MG tablet TAKE 1 TABLET (20 MG TOTAL) BY MOUTH 2 (TWO) TIMES DAILY. 180 tablet 2  . losartan (COZAAR) 50 MG tablet TAKE 1 TABLET (50 MG TOTAL) BY MOUTH DAILY. 90 tablet 0  . metFORMIN (GLUCOPHAGE)  1000 MG tablet TAKE 1 TABLET BY MOUTH 2 TIMES DAILY WITH A MEAL. 180 tablet 1  . traMADol (ULTRAM) 50 MG tablet Take 1 tablet (50 mg total) by mouth every 8 (eight) hours as needed. 60 tablet 5  . zolpidem (AMBIEN) 5 MG tablet TAKE 1 TABLET BY MOUTH AT BEDTIME AS NEEDED 30 tablet 1  . glipiZIDE (GLUCOTROL) 5 MG tablet TAKE 1  TABLET in the mornign and 1/2 tablet in the evening  Before meals 45 tablet 5  . fluticasone (FLONASE) 50 MCG/ACT nasal spray PLACE 2 SPRAYS INTO BOTH NOSTRILS DAILY. 16 g 4  . metFORMIN (GLUCOPHAGE) 500 MG tablet TAKE 2 TABLETS (1,000 MG TOTAL) BY MOUTH 2 (TWO) TIMES DAILY WITH A MEAL. (Patient not taking: Reported on 01/02/2018) 180 tablet 1   No facility-administered medications prior to visit.     Review of Systems;  Patient denies headache, fevers, malaise, unintentional weight loss, skin rash, eye pain, sinus congestion and sinus pain, sore throat, dysphagia,  hemoptysis , cough, dyspnea, wheezing, chest pain, palpitations, orthopnea, edema, abdominal pain, nausea, melena, diarrhea, constipation, flank pain, dysuria, hematuria, urinary  Frequency, nocturia, numbness, tingling, seizures,  Focal weakness, Loss of consciousness,  Tremor, insomnia, depression, anxiety, and suicidal ideation.      Objective:  BP 130/68 (BP Location: Left Arm, Patient Position: Sitting, Cuff Size: Large)   Pulse 85   Temp 97.8 F (  36.6 C) (Oral)   Resp 15   Ht 5\' 5"  (1.651 m)   Wt 242 lb 9.6 oz (110 kg)   SpO2 95%   BMI 40.37 kg/m   BP Readings from Last 3 Encounters:  01/02/18 130/68  09/26/17 130/74  06/14/17 (!) 144/66    Wt Readings from Last 3 Encounters:  01/02/18 242 lb 9.6 oz (110 kg)  09/26/17 244 lb 3.2 oz (110.8 kg)  06/14/17 238 lb 12.8 oz (108.3 kg)    General appearance: alert, cooperative and appears stated age Ears: normal TM's and external ear canals both ears Throat: lips, mucosa, and tongue normal; teeth and gums normal Neck: no adenopathy, no carotid  bruit, supple, symmetrical, trachea midline and thyroid not enlarged, symmetric, no tenderness/mass/nodules Back: symmetric, no curvature. ROM normal. No CVA tenderness. Lungs: clear to auscultation bilaterally Heart: regular rate and rhythm, S1, S2 normal, no murmur, click, rub or gallop Abdomen: soft, non-tender; bowel sounds normal; no masses,  no organomegaly Pulses: 2+ and symmetric Skin: Skin color, texture, turgor normal. No rashes or lesions Lymph nodes: Cervical, supraclavicular, and axillary nodes normal.  Lab Results  Component Value Date   HGBA1C 7.6 (H) 01/02/2018   HGBA1C 7.6 (H) 09/26/2017   HGBA1C 7.4 (H) 06/14/2017    Lab Results  Component Value Date   CREATININE 0.97 01/02/2018   CREATININE 0.93 09/26/2017   CREATININE 0.97 06/14/2017    Lab Results  Component Value Date   WBC 8.6 09/10/2015   HGB 10.3 (L) 09/10/2015   HCT 33.2 (L) 09/10/2015   PLT 287 09/10/2015   GLUCOSE 93 01/02/2018   CHOL 107 09/26/2017   TRIG 143.0 09/26/2017   HDL 43.10 09/26/2017   LDLDIRECT 52.0 09/26/2017   LDLCALC 35 09/26/2017   ALT 16 01/02/2018   AST 18 01/02/2018   NA 138 01/02/2018   K 4.3 01/02/2018   CL 104 01/02/2018   CREATININE 0.97 01/02/2018   BUN 16 01/02/2018   CO2 24 01/02/2018   TSH 2.44 03/20/2014   HGBA1C 7.6 (H) 01/02/2018   MICROALBUR 1.0 01/02/2018    Dg Wrist 2 Views Right  Result Date: 02/07/2017 CLINICAL DATA:  Postop right wrist . EXAM: RIGHT WRIST - 2 VIEW COMPARISON:  05/12/2016 . FINDINGS: Plate and screw fixation distal radius is noted. Anatomic alignment. Hardware intact. Soft tissue air noted most likely secondary to surgery. Patient is in a splint. IMPRESSION: Plate and screw fixation distal right radius. Anatomic alignment. Hardware intact. Electronically Signed   By: Marcello Moores  Register   On: 02/07/2017 14:56    Assessment & Plan:   Problem List Items Addressed This Visit    Type 2 diabetes mellitus with nephropathy ([place]) - Primary     Loss of  control.  Reminder for annual diabetic eye exam given..  Foot exam done. Meds reviewed and she is on a baby aspirin daily., a statin and an ACE inhibitor.  Urine tested for protein.  Will increase  Glipizide to 5 mg in the am and at night. continue metformin 1000 mg bid.   Lab Results  Component Value Date   MICROALBUR 1.0 01/02/2018   Lab Results  Component Value Date   HGBA1C 7.6 (H) 01/02/2018         Relevant Medications   glipiZIDE (GLUCOTROL) 5 MG tablet   Other Relevant Orders   Hemoglobin A1c (Completed)   Comprehensive metabolic panel (Completed)   Microalbumin / creatinine urine ratio (Completed)   Hyperlipidemia associated with type 2  diabetes mellitus ([place])    Using the Framingham risk calculator,  her 10 year risk of coronary artery disease is 23%, she is tolerating atorvastatin. lfts are normal  Lab Results  Component Value Date   CHOL 107 09/26/2017   HDL 43.10 09/26/2017   LDLCALC 35 09/26/2017   LDLDIRECT 52.0 09/26/2017   TRIG 143.0 09/26/2017   CHOLHDL 2 09/26/2017   Lab Results  Component Value Date   ALT 16 01/02/2018   AST 18 01/02/2018   ALKPHOS 95 01/02/2018   BILITOT 0.5 01/02/2018         Relevant Medications   glipiZIDE (GLUCOTROL) 5 MG tablet   Essential hypertension    Well controlled on current regimen. Renal function stable, no changes today.  Lab Results  Component Value Date   CREATININE 0.97 01/02/2018   Lab Results  Component Value Date   NA 138 01/02/2018   K 4.3 01/02/2018   CL 104 01/02/2018   CO2 24 01/02/2018          Other Visit Diagnoses    Need for shingles vaccine       Relevant Orders   Varicella Zoster Abs, IgG/IgM (Completed)   Breast cancer screening       Relevant Orders   MM 3D SCREEN BREAST BILATERAL   Estrogen deficiency       Relevant Orders   DG Bone Density      I have discontinued Monnica F. Tep's fluticasone. I have also changed her glipiZIDE. Additionally, I am having her  maintain her cholecalciferol, famotidine, atorvastatin, etodolac, traMADol, amLODipine, zolpidem, losartan, and metFORMIN.  Meds ordered this encounter  Medications  . glipiZIDE (GLUCOTROL) 5 MG tablet    Sig: Take 1 tablet (5 mg total) by mouth 2 (two) times daily before a meal.    Dispense:  60 tablet    Refill:  5    Medications Discontinued During This Encounter  Medication Reason  . fluticasone (FLONASE) 50 MCG/ACT nasal spray Patient has not taken in last 30 days  . metFORMIN (GLUCOPHAGE) 500 MG tablet Change in therapy  . glipiZIDE (GLUCOTROL) 5 MG tablet     Follow-up: Return in about 3 months (around 04/03/2018) for follow up diabetes.   Crecencio Mc, MD

## 2018-01-02 NOTE — Telephone Encounter (Signed)
Please advise 

## 2018-01-03 LAB — MICROALBUMIN / CREATININE URINE RATIO
CREATININE, U: 96.4 mg/dL
Microalb Creat Ratio: 1.1 mg/g (ref 0.0–30.0)
Microalb, Ur: 1 mg/dL (ref 0.0–1.9)

## 2018-01-03 LAB — VARICELLA ZOSTER ABS, IGG/IGM
Varicella IgM: <0.91 index (ref 0.00–0.90)
Varicella zoster IgG: 1647 index (ref >165)

## 2018-01-03 LAB — COMPREHENSIVE METABOLIC PANEL
ALK PHOS: 95 U/L (ref 39–117)
ALT: 16 U/L (ref 0–35)
AST: 18 U/L (ref 0–37)
Albumin: 3.9 g/dL (ref 3.5–5.2)
BILIRUBIN TOTAL: 0.5 mg/dL (ref 0.2–1.2)
BUN: 16 mg/dL (ref 6–23)
CALCIUM: 9.5 mg/dL (ref 8.4–10.5)
CO2: 24 mEq/L (ref 19–32)
Chloride: 104 mEq/L (ref 96–112)
Creatinine, Ser: 0.97 mg/dL (ref 0.40–1.20)
GFR: 61.22 mL/min (ref >60.00)
Glucose, Bld: 93 mg/dL (ref 70–99)
Potassium: 4.3 mEq/L (ref 3.5–5.1)
Sodium: 138 mEq/L (ref 135–145)
TOTAL PROTEIN: 6.9 g/dL (ref 6.0–8.3)

## 2018-01-03 LAB — HEMOGLOBIN A1C: Hgb A1c MFr Bld: 7.6 % — ABNORMAL HIGH (ref 4.6–6.5)

## 2018-01-03 MED ORDER — GLIPIZIDE 5 MG PO TABS: 5.0000 mg | ORAL_TABLET | Freq: Two times a day (BID) | ORAL | 5 refills | Status: DC

## 2018-01-03 NOTE — Assessment & Plan Note (Signed)
Using the Framingham risk calculator,  her 10 year risk of coronary artery disease is 23%, she is tolerating atorvastatin. lfts are normal  Lab Results  Component Value Date   CHOL 107 09/26/2017   HDL 43.10 09/26/2017   LDLCALC 35 09/26/2017   LDLDIRECT 52.0 09/26/2017   TRIG 143.0 09/26/2017   CHOLHDL 2 09/26/2017   Lab Results  Component Value Date   ALT 16 01/02/2018   AST 18 01/02/2018   ALKPHOS 95 01/02/2018   BILITOT 0.5 01/02/2018

## 2018-01-03 NOTE — Assessment & Plan Note (Signed)
Well controlled on current regimen. Renal function stable, no changes today.  Lab Results  Component Value Date   CREATININE 0.97 01/02/2018   Lab Results  Component Value Date   NA 138 01/02/2018   K 4.3 01/02/2018   CL 104 01/02/2018   CO2 24 01/02/2018

## 2018-01-03 NOTE — Assessment & Plan Note (Signed)
Loss of  control.  Reminder for annual diabetic eye exam given..  Foot exam done. Meds reviewed and she is on a baby aspirin daily., a statin and an ACE inhibitor.  Urine tested for protein.  Will increase  Glipizide to 5 mg in the am and at night. continue metformin 1000 mg bid.   Lab Results  Component Value Date   MICROALBUR 1.0 01/02/2018   Lab Results  Component Value Date   HGBA1C 7.6 (H) 01/02/2018

## 2018-01-04 ENCOUNTER — Other Ambulatory Visit: Payer: Self-pay | Admitting: Internal Medicine

## 2018-01-04 ENCOUNTER — Encounter: Payer: Self-pay | Admitting: Internal Medicine

## 2018-01-04 NOTE — Telephone Encounter (Signed)
Pt does not get any medications from this company.

## 2018-01-04 NOTE — Telephone Encounter (Signed)
Refilled: 11/01/2017 Last OV: 01/02/2018 Next OV: 04/05/2018

## 2018-01-15 ENCOUNTER — Other Ambulatory Visit: Payer: Self-pay | Admitting: Internal Medicine

## 2018-01-31 ENCOUNTER — Other Ambulatory Visit: Payer: Self-pay | Admitting: Internal Medicine

## 2018-02-21 LAB — HM DEXA SCAN: HM Dexa Scan: NORMAL

## 2018-02-21 LAB — HM DIABETES EYE EXAM

## 2018-02-22 ENCOUNTER — Other Ambulatory Visit: Payer: Self-pay | Admitting: Internal Medicine

## 2018-03-03 ENCOUNTER — Encounter: Payer: Self-pay | Admitting: Internal Medicine

## 2018-03-10 ENCOUNTER — Other Ambulatory Visit: Payer: Self-pay | Admitting: Internal Medicine

## 2018-03-13 NOTE — Telephone Encounter (Signed)
Refilled: 01/04/2018 Last OV: 01/02/2018 Next OV: 04/05/2018

## 2018-03-14 ENCOUNTER — Other Ambulatory Visit: Payer: Self-pay | Admitting: Internal Medicine

## 2018-03-14 MED ORDER — GLIPIZIDE 5 MG PO TABS: 5.0000 mg | ORAL_TABLET | Freq: Two times a day (BID) | ORAL | 2 refills | Status: DC

## 2018-03-15 ENCOUNTER — Encounter: Payer: Self-pay | Admitting: Internal Medicine

## 2018-03-15 NOTE — Telephone Encounter (Signed)
Printed, signed and faxed.  

## 2018-03-22 ENCOUNTER — Encounter: Payer: Self-pay | Admitting: Internal Medicine

## 2018-03-31 ENCOUNTER — Other Ambulatory Visit: Payer: Self-pay | Admitting: Internal Medicine

## 2018-03-31 DIAGNOSIS — E785 Hyperlipidemia, unspecified: Principal | ICD-10-CM

## 2018-03-31 DIAGNOSIS — E1169 Type 2 diabetes mellitus with other specified complication: Secondary | ICD-10-CM

## 2018-04-05 ENCOUNTER — Encounter: Payer: Self-pay | Admitting: Internal Medicine

## 2018-04-05 ENCOUNTER — Ambulatory Visit: Payer: BC Managed Care – PPO | Admitting: Internal Medicine

## 2018-04-05 VITALS — BP 140/66 | HR 85 | Temp 98.3°F | Resp 16 | Ht 65.0 in | Wt 243.0 lb

## 2018-04-05 DIAGNOSIS — R5383 Other fatigue: Secondary | ICD-10-CM

## 2018-04-05 DIAGNOSIS — E1121 Type 2 diabetes mellitus with diabetic nephropathy: Secondary | ICD-10-CM

## 2018-04-05 DIAGNOSIS — D649 Anemia, unspecified: Secondary | ICD-10-CM

## 2018-04-05 DIAGNOSIS — K2900 Acute gastritis without bleeding: Secondary | ICD-10-CM

## 2018-04-05 DIAGNOSIS — D509 Iron deficiency anemia, unspecified: Secondary | ICD-10-CM

## 2018-04-05 DIAGNOSIS — Z86010 Personal history of colonic polyps: Secondary | ICD-10-CM | POA: Diagnosis not present

## 2018-04-05 DIAGNOSIS — K2970 Gastritis, unspecified, without bleeding: Secondary | ICD-10-CM

## 2018-04-05 LAB — COMPREHENSIVE METABOLIC PANEL
ALBUMIN: 3.9 g/dL (ref 3.5–5.2)
ALK PHOS: 98 U/L (ref 39–117)
ALT: 19 U/L (ref 0–35)
AST: 15 U/L (ref 0–37)
BILIRUBIN TOTAL: 0.7 mg/dL (ref 0.2–1.2)
BUN: 20 mg/dL (ref 6–23)
CO2: 28 mEq/L (ref 19–32)
CREATININE: 0.97 mg/dL (ref 0.40–1.20)
Calcium: 9.3 mg/dL (ref 8.4–10.5)
Chloride: 103 mEq/L (ref 96–112)
GFR: 61.17 mL/min (ref >60.00)
Glucose, Bld: 158 mg/dL — ABNORMAL HIGH (ref 70–99)
Potassium: 3.7 mEq/L (ref 3.5–5.1)
SODIUM: 138 meq/L (ref 135–145)
TOTAL PROTEIN: 6.8 g/dL (ref 6.0–8.3)

## 2018-04-05 LAB — CBC WITH DIFFERENTIAL/PLATELET
BASOS ABS: 0 10*3/uL (ref 0.0–0.1)
Basophils Relative: 0.6 % (ref 0.0–3.0)
Eosinophils Absolute: 0.5 10*3/uL (ref 0.0–0.7)
Eosinophils Relative: 6.3 % — ABNORMAL HIGH (ref 0.0–5.0)
HCT: 35.3 % — ABNORMAL LOW (ref 36.0–46.0)
Hemoglobin: 11.1 g/dL — ABNORMAL LOW (ref 12.0–15.0)
Lymphocytes Relative: 27.6 % (ref 12.0–46.0)
Lymphs Abs: 2 10*3/uL (ref 0.7–4.0)
MCHC: 31.5 g/dL (ref 30.0–36.0)
MCV: 77.2 fl — ABNORMAL LOW (ref 78.0–100.0)
MONO ABS: 0.6 10*3/uL (ref 0.1–1.0)
Monocytes Relative: 7.8 % (ref 3.0–12.0)
NEUTROS PCT: 57.7 % (ref 43.0–77.0)
Neutro Abs: 4.2 10*3/uL (ref 1.4–7.7)
Platelets: 275 10*3/uL (ref 150.0–400.0)
RBC: 4.57 Mil/uL (ref 3.87–5.11)
RDW: 17.1 % — ABNORMAL HIGH (ref 11.5–15.5)
WBC: 7.3 10*3/uL (ref 4.0–10.5)

## 2018-04-05 LAB — HEMOGLOBIN A1C: Hgb A1c MFr Bld: 7.3 % — ABNORMAL HIGH (ref 4.6–6.5)

## 2018-04-05 LAB — VITAMIN B12: Vitamin B-12: 163 pg/mL — ABNORMAL LOW (ref 211–911)

## 2018-04-05 LAB — TSH: TSH: 2.59 u[IU]/mL (ref 0.35–4.50)

## 2018-04-05 MED ORDER — OMEPRAZOLE 40 MG PO CPDR: 40.0000 mg | DELAYED_RELEASE_CAPSULE | Freq: Every day | ORAL | 3 refills | Status: DC

## 2018-04-05 MED ORDER — SCOPOLAMINE 1 MG/3DAYS TD PT72: 1.0000 | MEDICATED_PATCH | TRANSDERMAL | 0 refills | Status: DC

## 2018-04-05 NOTE — Progress Notes (Signed)
Subjective:  Patient ID: Cheryl Wallace, female    DOB: 01/22/1951  Age: 67 y.o. MRN: [id number]  CC: The primary encounter diagnosis was Fatigue, unspecified type. Diagnoses of Type 2 diabetes mellitus with nephropathy ([place]), Anemia, unspecified type, Personal history of colonic polyps, Iron deficiency anemia, unspecified iron deficiency anemia type, Acute gastritis, presence of bleeding unspecified, unspecified gastritis type, and Gastritis, presence of bleeding unspecified, unspecified chronicity, unspecified gastritis type were also pertinent to this visit.  HPI Cheryl Wallace presents for 3 month follow up on diabetes and hypertension .  Patient has no complaints today.  Patient is trying to follow a low glycemic index diet . Had severe GERD after eating ice cream  But no diarrhea and tolerated cheese fine  .    taking all prescribed medications regularly without side effects.  Fasting sugars have been under less than 140 most of the time and post prandials have been under 160 except on rare occasions. Patient is not exercising or intentionally trying to lose weight .  Patient has had an eye exam in the last 12 months and checks feet regularly for signs of infection.  Patient does not walk barefoot outside,  And denies an numbness tingling or burning in feet. Patient is up to date on all recommended vaccinations  Post prandial  45 min 119 after bacon.  Pre dinner 101.  Drinks presweetened iced tea   GERD:  Taking pepcid 20 mg bi, dGERD has gotten worse. resuming omeprazole 40 mg   She has had her  3d mammogram.  At [facility] .    Got charged $300   Breasts entirely fatty .  requesting leltter of appeal. Previous one was 2017 . breast felt very dense to me.     Worried about insurance noncoverage of [facility] (out of network)   Lab Results  Component Value Date   TSH 2.59 04/05/2018   Lab Results  Component Value Date   WBC 7.3 04/05/2018   HGB 11.1 (L) 04/05/2018   HCT 35.3 (L) 04/05/2018   MCV  77.2 (L) 04/05/2018   PLT 275.0 04/05/2018     [place]  Outpatient Medications Prior to Visit  Medication Sig Dispense Refill  . amLODipine (NORVASC) 5 MG tablet TAKE 1 TABLET (5 MG TOTAL) BY MOUTH DAILY. 90 tablet 1  . atorvastatin (LIPITOR) 20 MG tablet TAKE 1 TABLET BY MOUTH EVERY DAY 90 tablet 1  . cholecalciferol (VITAMIN D) 1000 UNITS tablet Take 1,000 Units by mouth daily.     [name] etodolac (LODINE) 500 MG tablet Take 500 mg by mouth 2 (two) times daily.  1  . fluticasone (FLONASE) 50 MCG/ACT nasal spray SPRAY 2 SPRAYS INTO EACH NOSTRIL EVERY DAY 16 g 4  . glipiZIDE (GLUCOTROL) 5 MG tablet Take 1 tablet (5 mg total) by mouth 2 (two) times daily before a meal. 60 tablet 2  . losartan (COZAAR) 50 MG tablet TAKE 2 TABLET (50 MG TOTAL) BY MOUTH DAILY. 180 tablet 1  . metFORMIN (GLUCOPHAGE) 1000 MG tablet TAKE 1 TABLET BY MOUTH 2 TIMES DAILY WITH A MEAL. 180 tablet 1  . traMADol (ULTRAM) 50 MG tablet Take 1 tablet (50 mg total) by mouth every 8 (eight) hours as needed. 60 tablet 5  . zolpidem (AMBIEN) 5 MG tablet TAKE 1 TABLET BY MOUTH AT BEDTIME AS NEEDED 30 tablet 1  . famotidine (PEPCID) 20 MG tablet TAKE 1 TABLET (20 MG TOTAL) BY MOUTH 2 (TWO) TIMES DAILY. 180 tablet 2  No facility-administered medications prior to visit.     Review of Systems;  Patient denies headache, fevers, malaise, unintentional weight loss, skin rash, eye pain, sinus congestion and sinus pain, sore throat, dysphagia,  hemoptysis , cough, dyspnea, wheezing, chest pain, palpitations, orthopnea, edema, abdominal pain, nausea, melena, diarrhea, constipation, flank pain, dysuria, hematuria, urinary  Frequency, nocturia, numbness, tingling, seizures,  Focal weakness, Loss of consciousness,  Tremor, insomnia, depression, anxiety, and suicidal ideation.      Objective:  BP 140/66 (BP Location: Left Arm, Patient Position: Sitting, Cuff Size: Large)   Pulse 85   Temp 98.3 F (36.8 C) (Oral)   Resp 16   Ht 5\' 5"   (1.651 m)   Wt 243 lb (110.2 kg)   SpO2 95%   BMI 40.44 kg/m   BP Readings from Last 3 Encounters:  04/05/18 140/66  01/02/18 130/68  09/26/17 130/74    Wt Readings from Last 3 Encounters:  04/05/18 243 lb (110.2 kg)  01/02/18 242 lb 9.6 oz (110 kg)  09/26/17 244 lb 3.2 oz (110.8 kg)    General appearance: alert, cooperative and appears stated age Ears: normal TM's and external ear canals both ears Throat: lips, mucosa, and tongue normal; teeth and gums normal Neck: no adenopathy, no carotid bruit, supple, symmetrical, trachea midline and thyroid not enlarged, symmetric, no tenderness/mass/nodules Back: symmetric, no curvature. ROM normal. No CVA tenderness. Lungs: clear to auscultation bilaterally Heart: regular rate and rhythm, S1, S2 normal, no murmur, click, rub or gallop Abdomen: soft, non-tender; bowel sounds normal; no masses,  no organomegaly Pulses: 2+ and symmetric Skin: Skin color, texture, turgor normal. No rashes or lesions Lymph nodes: Cervical, supraclavicular, and axillary nodes normal.  Lab Results  Component Value Date   HGBA1C 7.3 (H) 04/05/2018   HGBA1C 7.6 (H) 01/02/2018   HGBA1C 7.6 (H) 09/26/2017    Lab Results  Component Value Date   CREATININE 0.97 04/05/2018   CREATININE 0.97 01/02/2018   CREATININE 0.93 09/26/2017    Lab Results  Component Value Date   WBC 7.3 04/05/2018   HGB 11.1 (L) 04/05/2018   HCT 35.3 (L) 04/05/2018   PLT 275.0 04/05/2018   GLUCOSE 158 (H) 04/05/2018   CHOL 107 09/26/2017   TRIG 143.0 09/26/2017   HDL 43.10 09/26/2017   LDLDIRECT 52.0 09/26/2017   LDLCALC 35 09/26/2017   ALT 19 04/05/2018   AST 15 04/05/2018   NA 138 04/05/2018   K 3.7 04/05/2018   CL 103 04/05/2018   CREATININE 0.97 04/05/2018   BUN 20 04/05/2018   CO2 28 04/05/2018   TSH 2.59 04/05/2018   HGBA1C 7.3 (H) 04/05/2018   MICROALBUR 1.0 01/02/2018    Dg Wrist 2 Views Right  Result Date: 02/07/2017 CLINICAL DATA:  Postop right wrist .  EXAM: RIGHT WRIST - 2 VIEW COMPARISON:  05/12/2016 . FINDINGS: Plate and screw fixation distal radius is noted. Anatomic alignment. Hardware intact. Soft tissue air noted most likely secondary to surgery. Patient is in a splint. IMPRESSION: Plate and screw fixation distal right radius. Anatomic alignment. Hardware intact. Electronically Signed   By: Marcello Moores  Register   On: 02/07/2017 14:56    Assessment & Plan:   Problem List Items Addressed This Visit    Personal history of colonic polyps    Patient's last colonosocpy was in 2010.  However she has developed an IDA and has had increasesd GERD symptoms (severe) recently. Sending to [place] for EGD/colonoscopy       Relevant Orders  Ambulatory referral to General Surgery   Type 2 diabetes mellitus with nephropathy ([place])    Improving  control.  Reminder for annual diabetic eye exam given..  Foot exam done. Meds reviewed and she is on a baby aspirin daily., a statin and an ACE inhibitor.  Urine tested for protein.  Continue  Glipizide to 5 mg in the am and at night. continue metformin 1000 mg bid.   Lab Results  Component Value Date   MICROALBUR 1.0 01/02/2018   Lab Results  Component Value Date   HGBA1C 7.3 (H) 04/05/2018         Relevant Orders   Hemoglobin A1c (Completed)   Comprehensive metabolic panel (Completed)   Iron deficiency anemia    In the setting of increased symptoms of gastritis and history of polyps.  GI evaluation needed  Lab Results  Component Value Date   WBC 7.3 04/05/2018   HGB 11.1 (L) 04/05/2018   HCT 35.3 (L) 04/05/2018   MCV 77.2 (L) 04/05/2018   PLT 275.0 04/05/2018   Lab Results  Component Value Date   IRON 47 04/05/2018   TIBC 473 (H) 04/05/2018   FERRITIN 8 (L) 04/05/2018         Relevant Orders   Ambulatory referral to General Surgery   Gastritis    Advised to resume omeprazole 40 mg daily .  givne new onset IDA  EGD needed       Relevant Orders   Ambulatory referral to General Surgery      Other Visit Diagnoses    Fatigue, unspecified type    -  Primary   Relevant Orders   TSH (Completed)   Anemia, unspecified type       Relevant Orders   CBC with Differential/Platelet (Completed)   Vitamin B12 (Completed)   Iron, TIBC and Ferritin Panel (Completed)      I have discontinued Marybelle F. Rama's famotidine. I am also having her start on scopolamine. Additionally, I am having her maintain her cholecalciferol, etodolac, traMADol, amLODipine, metFORMIN, losartan, fluticasone, zolpidem, glipiZIDE, atorvastatin, and omeprazole.  Meds ordered this encounter  Medications  . scopolamine (TRANSDERM-SCOP, 1.5 MG,) 1 MG/3DAYS    Sig: Place 1 patch (1.5 mg total) onto the skin every 3 (three) days.    Dispense:  8 patch    Refill:  0  . omeprazole (PRILOSEC) 40 MG capsule    Sig: Take 1 capsule (40 mg total) by mouth daily.    Dispense:  30 capsule    Refill:  3    Medications Discontinued During This Encounter  Medication Reason  . famotidine (PEPCID) 20 MG tablet     Follow-up: No follow-ups on file.   Crecencio Mc, MD

## 2018-04-05 NOTE — Patient Instructions (Addendum)
We are suspending pepcid and resuming omeprazole.  Do not take pepcid unless you have nighttime symptoms  resume omeprazole 40 mg daily Im  the morning 30 minutes before food   I have written a letter of appeal to your insurance for the mammogram charge   Next year we will resume the non 3D mammogram   You were mildly anemic last year;  I am checking yout thyroid,  B12 and iron today to see if they are low   If your a1c is above 7.0 today,  I will recommend starting medication to control your diabetes

## 2018-04-06 LAB — IRON,TIBC AND FERRITIN PANEL
%SAT: 10 % — AB (ref 16–45)
Ferritin: 8 ng/mL — ABNORMAL LOW (ref 16–288)
Iron: 47 ug/dL (ref 45–160)
TIBC: 473 mcg/dL (calc) — ABNORMAL HIGH (ref 250–450)

## 2018-04-07 ENCOUNTER — Encounter: Payer: Self-pay | Admitting: Internal Medicine

## 2018-04-07 DIAGNOSIS — K2970 Gastritis, unspecified, without bleeding: Secondary | ICD-10-CM | POA: Insufficient documentation

## 2018-04-07 DIAGNOSIS — D509 Iron deficiency anemia, unspecified: Secondary | ICD-10-CM | POA: Insufficient documentation

## 2018-04-07 NOTE — Assessment & Plan Note (Signed)
Improving  control.  Reminder for annual diabetic eye exam given..  Foot exam done. Meds reviewed and she is on a baby aspirin daily., a statin and an ACE inhibitor.  Urine tested for protein.  Continue  Glipizide to 5 mg in the am and at night. continue metformin 1000 mg bid.   Lab Results  Component Value Date   MICROALBUR 1.0 01/02/2018   Lab Results  Component Value Date   HGBA1C 7.3 (H) 04/05/2018

## 2018-04-07 NOTE — Assessment & Plan Note (Signed)
Patient's last colonosocpy was in 2010.  However she has developed an IDA and has had increasesd GERD symptoms (severe) recently. Sending to [place] for EGD/colonoscopy

## 2018-04-07 NOTE — Assessment & Plan Note (Signed)
In the setting of increased symptoms of gastritis and history of polyps.  GI evaluation needed  Lab Results  Component Value Date   WBC 7.3 04/05/2018   HGB 11.1 (L) 04/05/2018   HCT 35.3 (L) 04/05/2018   MCV 77.2 (L) 04/05/2018   PLT 275.0 04/05/2018   Lab Results  Component Value Date   IRON 47 04/05/2018   TIBC 473 (H) 04/05/2018   FERRITIN 8 (L) 04/05/2018

## 2018-04-07 NOTE — Assessment & Plan Note (Signed)
Advised to resume omeprazole 40 mg daily .  givne new onset IDA  EGD needed

## 2018-04-08 ENCOUNTER — Other Ambulatory Visit: Payer: Self-pay | Admitting: Internal Medicine

## 2018-04-11 NOTE — Telephone Encounter (Signed)
Spoke with pt and she has been scheduled for first b12 injection on 04/18/2018 @ 3pm. Pt is aware of appt date and time.

## 2018-04-19 ENCOUNTER — Ambulatory Visit (INDEPENDENT_AMBULATORY_CARE_PROVIDER_SITE_OTHER): Payer: BC Managed Care – PPO

## 2018-04-19 DIAGNOSIS — E538 Deficiency of other specified B group vitamins: Secondary | ICD-10-CM | POA: Diagnosis not present

## 2018-04-19 MED ORDER — CYANOCOBALAMIN 1000 MCG/ML IJ SOLN
1000.0000 ug | Freq: Once | INTRAMUSCULAR | Status: AC
  Administered 2018-04-19: 1000 ug via INTRAMUSCULAR

## 2018-04-19 NOTE — Progress Notes (Signed)
Pt came in today for first weekly B12 injection. Given in R Deltoid. Tolerated well and voiced no complaints or concerns at this time.

## 2018-04-20 ENCOUNTER — Other Ambulatory Visit: Payer: Self-pay | Admitting: Internal Medicine

## 2018-04-26 ENCOUNTER — Ambulatory Visit (INDEPENDENT_AMBULATORY_CARE_PROVIDER_SITE_OTHER): Payer: BC Managed Care – PPO

## 2018-04-26 DIAGNOSIS — E538 Deficiency of other specified B group vitamins: Secondary | ICD-10-CM | POA: Diagnosis not present

## 2018-04-26 MED ORDER — CYANOCOBALAMIN 1000 MCG/ML IJ SOLN
1000.0000 ug | Freq: Once | INTRAMUSCULAR | Status: AC
  Administered 2018-04-26: 1000 ug via INTRAMUSCULAR

## 2018-04-26 NOTE — Progress Notes (Signed)
Patient presented for B 12 injection to left deltoid, patient voiced no concerns nor showed any signs of distress during injection. 

## 2018-05-03 ENCOUNTER — Other Ambulatory Visit: Payer: Self-pay | Admitting: Internal Medicine

## 2018-05-03 ENCOUNTER — Ambulatory Visit (INDEPENDENT_AMBULATORY_CARE_PROVIDER_SITE_OTHER): Payer: BC Managed Care – PPO | Admitting: *Deleted

## 2018-05-03 DIAGNOSIS — E538 Deficiency of other specified B group vitamins: Secondary | ICD-10-CM | POA: Diagnosis not present

## 2018-05-03 MED ORDER — CYANOCOBALAMIN 1000 MCG/ML IJ SOLN
1000.0000 ug | Freq: Once | INTRAMUSCULAR | Status: AC
  Administered 2018-05-03: 1000 ug via INTRAMUSCULAR

## 2018-05-03 NOTE — Progress Notes (Signed)
  I have reviewed the above information and agree with above.   Teresa Tullo, MD 

## 2018-05-03 NOTE — Progress Notes (Signed)
Patient presented for B 12 injection to right deltoid, patient voiced no concerns nor showed any signs of distress during injection. 

## 2018-05-04 ENCOUNTER — Encounter: Payer: Self-pay | Admitting: General Surgery

## 2018-05-15 ENCOUNTER — Other Ambulatory Visit: Payer: Self-pay | Admitting: Internal Medicine

## 2018-05-15 NOTE — Telephone Encounter (Signed)
Refilled: 03/14/2018  #30 1 rf Last OV: 04/05/2018 Next OV: 07/07/2018

## 2018-06-07 ENCOUNTER — Other Ambulatory Visit: Payer: Self-pay | Admitting: Internal Medicine

## 2018-06-13 ENCOUNTER — Ambulatory Visit (INDEPENDENT_AMBULATORY_CARE_PROVIDER_SITE_OTHER): Payer: BC Managed Care – PPO

## 2018-06-13 DIAGNOSIS — E538 Deficiency of other specified B group vitamins: Secondary | ICD-10-CM

## 2018-06-13 MED ORDER — CYANOCOBALAMIN 1000 MCG/ML IJ SOLN
1000.0000 ug | Freq: Once | INTRAMUSCULAR | Status: AC
  Administered 2018-06-13: 1000 ug via INTRAMUSCULAR

## 2018-06-13 NOTE — Progress Notes (Signed)
B12 injection given in left deltoid. Patient tolerated well.

## 2018-06-18 NOTE — Progress Notes (Signed)
  I have reviewed the above information and agree with above.   Harkirat Orozco, MD 

## 2018-06-25 ENCOUNTER — Other Ambulatory Visit: Payer: Self-pay | Admitting: Internal Medicine

## 2018-07-07 ENCOUNTER — Ambulatory Visit: Payer: BC Managed Care – PPO | Admitting: Internal Medicine

## 2018-07-11 ENCOUNTER — Encounter: Payer: Self-pay | Admitting: Internal Medicine

## 2018-07-11 ENCOUNTER — Ambulatory Visit: Payer: BC Managed Care – PPO | Admitting: Internal Medicine

## 2018-07-11 VITALS — BP 136/78 | HR 85 | Temp 98.1°F | Resp 16 | Ht 65.0 in | Wt 240.4 lb

## 2018-07-11 DIAGNOSIS — E1121 Type 2 diabetes mellitus with diabetic nephropathy: Secondary | ICD-10-CM | POA: Diagnosis not present

## 2018-07-11 DIAGNOSIS — Z23 Encounter for immunization: Secondary | ICD-10-CM

## 2018-07-11 DIAGNOSIS — E785 Hyperlipidemia, unspecified: Secondary | ICD-10-CM

## 2018-07-11 DIAGNOSIS — E1169 Type 2 diabetes mellitus with other specified complication: Secondary | ICD-10-CM | POA: Diagnosis not present

## 2018-07-11 DIAGNOSIS — I10 Essential (primary) hypertension: Secondary | ICD-10-CM | POA: Diagnosis not present

## 2018-07-11 DIAGNOSIS — M25561 Pain in right knee: Secondary | ICD-10-CM | POA: Diagnosis not present

## 2018-07-11 LAB — POCT GLYCOSYLATED HEMOGLOBIN (HGB A1C): Hemoglobin A1C: 6.4 % — AB (ref 4.0–5.6)

## 2018-07-11 LAB — COMPREHENSIVE METABOLIC PANEL
ALBUMIN: 3.9 g/dL (ref 3.5–5.2)
ALK PHOS: 91 U/L (ref 39–117)
ALT: 16 U/L (ref 0–35)
AST: 15 U/L (ref 0–37)
BILIRUBIN TOTAL: 0.6 mg/dL (ref 0.2–1.2)
BUN: 16 mg/dL (ref 6–23)
CO2: 26 meq/L (ref 19–32)
CREATININE: 1 mg/dL (ref 0.40–1.20)
Calcium: 9.2 mg/dL (ref 8.4–10.5)
Chloride: 105 mEq/L (ref 96–112)
GFR: 59.01 mL/min — AB (ref >60.00)
Glucose, Bld: 187 mg/dL — ABNORMAL HIGH (ref 70–99)
Potassium: 3.6 mEq/L (ref 3.5–5.1)
Sodium: 140 mEq/L (ref 135–145)
TOTAL PROTEIN: 6.6 g/dL (ref 6.0–8.3)

## 2018-07-11 MED ORDER — CELECOXIB 200 MG PO CAPS: 200.0000 mg | ORAL_CAPSULE | Freq: Two times a day (BID) | ORAL | 0 refills | Status: DC

## 2018-07-11 MED ORDER — HYDROCODONE-ACETAMINOPHEN 10-325 MG PO TABS: 1.0000 | ORAL_TABLET | Freq: Four times a day (QID) | ORAL | 0 refills | Status: DC | PRN

## 2018-07-11 NOTE — Patient Instructions (Signed)
Your "pop" and knee pain may be due to a ruptured Baker's cyst,  Or due to a ligament sprain  Referral to Dr Tamala Julian underway.  Trial of celebrex instead of ibuprofen 200 mg every 12 hours as needed  Add up to 2000 mg of acetominophen (tylenol) every day safely  In divided doses (500 mg every 6 hours  Or 1000 mg every 12 hours.)  Substitute Vicodin for the tylenol in the evening

## 2018-07-11 NOTE — Progress Notes (Signed)
Subjective:  Patient ID: Cheryl Wallace, female    DOB: 12/31/1950  Age: 67 y.o. MRN: [id number]  CC: The primary encounter diagnosis was Type 2 diabetes mellitus with nephropathy ([place]). Diagnoses of Acute pain of right knee, Need for vaccination with 13-polyvalent pneumococcal conjugate vaccine, Posterior right knee pain, Essential hypertension, and Hyperlipidemia associated with type 2 diabetes mellitus ([place]) were also pertinent to this visit.  HPI Cheryl Wallace presents for 3 month follow up on diabetes.   \ Lab Results  Component Value Date   MICROALBUR 1.0 01/02/2018    Patient has cc of right knee pain since Saturday.  No unusual activity,  exccept  had driven 3 hours the night before,.  Heard  d popping sound,  Felt a  Pop while getting out of car..  Then had severe pain behind the knee and on the medial side,  But Not anteriorly.  No bruising seen /    But   no complaints today.  Patient is following a low glycemic index diet and taking all prescribed medications regularly without side effects.  Fasting sugars have been under less than 140 most of the time and post prandials have been under 160 except on rare occasions. Patient is exercising about 3 times per week and intentionally trying to lose weight .  Patient has had an eye exam in the last 12 months and checks feet regularly for signs of infection.  Patient does not walk barefoot outside,  And denies an numbness tingling or burning in feet. Patient is up to date on all recommended vaccinations  Has been taking ibuprofen 400  Mg , didn't help, last dose yesterday ,   and bayer back and body  Which helped transiently   Tramadol previous trial for pain from a heel spur "id not help at all "     Lab Results  Component Value Date   MICROALBUR 1.0 01/02/2018     Outpatient Medications Prior to Visit  Medication Sig Dispense Refill  . amLODipine (NORVASC) 5 MG tablet TAKE 1 TABLET (5 MG TOTAL) BY MOUTH DAILY. 90 tablet 1  .  atorvastatin (LIPITOR) 20 MG tablet TAKE 1 TABLET BY MOUTH EVERY DAY 90 tablet 1  . cholecalciferol (VITAMIN D) 1000 UNITS tablet Take 1,000 Units by mouth daily.     [name] etodolac (LODINE) 500 MG tablet Take 500 mg by mouth 2 (two) times daily.  1  . famotidine (PEPCID) 20 MG tablet TAKE 1 TABLET (20 MG TOTAL) BY MOUTH 2 (TWO) TIMES DAILY.  2  . fluticasone (FLONASE) 50 MCG/ACT nasal spray SPRAY 2 SPRAYS INTO EACH NOSTRIL EVERY DAY 16 g 4  . glipiZIDE (GLUCOTROL) 5 MG tablet TAKE 1 TABLET (5 MG TOTAL) BY MOUTH 2 (TWO) TIMES DAILY BEFORE A MEAL. 180 tablet 0  . ipratropium (ATROVENT) 0.03 % nasal spray PLACE 2 SPRAYS INTO BOTH NOSTRILS 2 (TWO) TIMES DAILY  11  . losartan (COZAAR) 50 MG tablet TAKE 2 TABLET (50 MG TOTAL) BY MOUTH DAILY. 180 tablet 1  . metFORMIN (GLUCOPHAGE) 1000 MG tablet TAKE 1 TABLET BY MOUTH 2 TIMES DAILY WITH A MEAL. 180 tablet 1  . omeprazole (PRILOSEC) 40 MG capsule Take 1 capsule (40 mg total) by mouth daily. 30 capsule 3  . scopolamine (TRANSDERM-SCOP, 1.5 MG,) 1 MG/3DAYS Place 1 patch (1.5 mg total) onto the skin every 3 (three) days. 8 patch 0  . traMADol (ULTRAM) 50 MG tablet Take 1 tablet (50 mg total) by mouth  every 8 (eight) hours as needed. 60 tablet 5  . zolpidem (AMBIEN) 5 MG tablet TAKE 1 TABLET BY MOUTH AT BEDTIME AS NEEDED 30 tablet 5   No facility-administered medications prior to visit.     Review of Systems;  Patient denies headache, fevers, malaise, unintentional weight loss, skin rash, eye pain, sinus congestion and sinus pain, sore throat, dysphagia,  hemoptysis , cough, dyspnea, wheezing, chest pain, palpitations, orthopnea, edema, abdominal pain, nausea, melena, diarrhea, constipation, flank pain, dysuria, hematuria, urinary  Frequency, nocturia, numbness, tingling, seizures,  Focal weakness, Loss of consciousness,  Tremor, insomnia, depression, anxiety, and suicidal ideation.      Objective:  BP 136/78 (BP Location: Left Arm, Patient Position:  Sitting, Cuff Size: Normal)   Pulse 85   Temp 98.1 F (36.7 C) (Oral)   Resp 16   Ht 5\' 5"  (1.651 m)   Wt 240 lb 6.4 oz (109 kg)   SpO2 96%   BMI 40.00 kg/m   BP Readings from Last 3 Encounters:  07/11/18 136/78  04/05/18 140/66  01/02/18 130/68    Wt Readings from Last 3 Encounters:  07/11/18 240 lb 6.4 oz (109 kg)  04/05/18 243 lb (110.2 kg)  01/02/18 242 lb 9.6 oz (110 kg)    General appearance: alert, cooperative and appears stated age Ears: normal TM's and external ear canals both ears Throat: lips, mucosa, and tongue normal; teeth and gums normal Neck: no adenopathy, no carotid bruit, supple, symmetrical, trachea midline and thyroid not enlarged, symmetric, no tenderness/mass/nodules Back: symmetric, no curvature. ROM normal. No CVA tenderness. Lungs: clear to auscultation bilaterally Heart: regular rate and rhythm, S1, S2 normal, no murmur, click, rub or gallop Abdomen: soft, non-tender; bowel sounds normal; no masses,  no organomegaly Pulses: 2+ and symmetric Skin: Skin color, texture, turgor normal. No rashes or lesions Ext: no edema, or joint effusion either knee.  No bruising.  Lymph nodes: Cervical, supraclavicular, and axillary nodes normal.  Lab Results  Component Value Date   HGBA1C 6.4 (A) 07/11/2018   HGBA1C 7.3 (H) 04/05/2018   HGBA1C 7.6 (H) 01/02/2018    Lab Results  Component Value Date   CREATININE 1.00 07/11/2018   CREATININE 0.97 04/05/2018   CREATININE 0.97 01/02/2018    Lab Results  Component Value Date   WBC 7.3 04/05/2018   HGB 11.1 (L) 04/05/2018   HCT 35.3 (L) 04/05/2018   PLT 275.0 04/05/2018   GLUCOSE 187 (H) 07/11/2018   CHOL 107 09/26/2017   TRIG 143.0 09/26/2017   HDL 43.10 09/26/2017   LDLDIRECT 52.0 09/26/2017   LDLCALC 35 09/26/2017   ALT 16 07/11/2018   AST 15 07/11/2018   NA 140 07/11/2018   K 3.6 07/11/2018   CL 105 07/11/2018   CREATININE 1.00 07/11/2018   BUN 16 07/11/2018   CO2 26 07/11/2018   TSH 2.59  04/05/2018   HGBA1C 6.4 (A) 07/11/2018   MICROALBUR 1.0 01/02/2018     Assessment & Plan:   Problem List Items Addressed This Visit    Essential hypertension    Well controlled on current regimen. Renal function stable, no changes today.  Lab Results  Component Value Date   CREATININE 1.00 07/11/2018   Lab Results  Component Value Date   NA 140 07/11/2018   K 3.6 07/11/2018   CL 105 07/11/2018   CO2 26 07/11/2018         Hyperlipidemia associated with type 2 diabetes mellitus ([place])    Using the Framingham risk  calculator,  her 10 year risk of coronary artery disease is 23%, she is tolerating atorvastatin. lfts are normal  Lab Results  Component Value Date   CHOL 107 09/26/2017   HDL 43.10 09/26/2017   LDLCALC 35 09/26/2017   LDLDIRECT 52.0 09/26/2017   TRIG 143.0 09/26/2017   CHOLHDL 2 09/26/2017   Lab Results  Component Value Date   ALT 16 07/11/2018   AST 15 07/11/2018   ALKPHOS 91 07/11/2018   BILITOT 0.6 07/11/2018         Posterior right knee pain    Suspect ruptured Baker's Cyst vs bursitis or sprained ligament.  Referral to [facility].  Trial of celebrex and tylenol.  vicodin for evening use.       Type 2 diabetes mellitus with nephropathy ([facility]) - Primary    Improving  control.  Reminder for annual diabetic eye exam given..  Foot exam done. Meds reviewed and she is on a baby aspirin daily., a statin and an ACE inhibitor.  Urine tested for protein.  Continue  Glipizide  5 mg in the am and at night. continue metformin 1000 mg bid.   Lab Results  Component Value Date   MICROALBUR 1.0 01/02/2018   Lab Results  Component Value Date   HGBA1C 6.4 (A) 07/11/2018         Relevant Orders   POCT HgB A1C (Completed)   Comprehensive metabolic panel (Completed)   Hemoglobin A1c   Comprehensive metabolic panel   Lipid panel    Other Visit Diagnoses    Acute pain of right knee       Relevant Orders   Ambulatory referral to Sports Medicine   Need for  vaccination with 13-polyvalent pneumococcal conjugate vaccine       Relevant Orders   Pneumococcal conjugate vaccine 13-valent IM (Completed)    A total of 25 minutes of face to face time was spent with patient more than half of which was spent in counselling about the above mentioned conditions  and coordination of care   I have discontinued Marialice F. Moye's HYDROcodone-acetaminophen. I am also having her start on celecoxib and HYDROcodone-acetaminophen. Additionally, I am having her maintain her cholecalciferol, etodolac, traMADol, losartan, fluticasone, atorvastatin, scopolamine, omeprazole, amLODipine, zolpidem, glipiZIDE, metFORMIN, ipratropium, and famotidine.  Meds ordered this encounter  Medications  . celecoxib (CELEBREX) 200 MG capsule    Sig: Take 1 capsule (200 mg total) by mouth 2 (two) times daily.    Dispense:  60 capsule    Refill:  0  . DISCONTD: HYDROcodone-acetaminophen (NORCO) 10-325 MG tablet    Sig: Take 1 tablet by mouth every 6 (six) hours as needed.    Dispense:  30 tablet    Refill:  0  . HYDROcodone-acetaminophen (NORCO) 10-325 MG tablet    Sig: Take 1 tablet by mouth every 6 (six) hours as needed.    Dispense:  30 tablet    Refill:  0    Medications Discontinued During This Encounter  Medication Reason  . HYDROcodone-acetaminophen (NORCO) 10-325 MG tablet     Follow-up: Return in about 3 months (around 10/11/2018) for follow up diabetes.   Crecencio Mc, MD

## 2018-07-12 DIAGNOSIS — M25561 Pain in right knee: Secondary | ICD-10-CM | POA: Insufficient documentation

## 2018-07-12 DIAGNOSIS — G8929 Other chronic pain: Secondary | ICD-10-CM | POA: Insufficient documentation

## 2018-07-12 NOTE — Assessment & Plan Note (Signed)
Using the Framingham risk calculator,  her 10 year risk of coronary artery disease is 23%, she is tolerating atorvastatin. lfts are normal  Lab Results  Component Value Date   CHOL 107 09/26/2017   HDL 43.10 09/26/2017   LDLCALC 35 09/26/2017   LDLDIRECT 52.0 09/26/2017   TRIG 143.0 09/26/2017   CHOLHDL 2 09/26/2017   Lab Results  Component Value Date   ALT 16 07/11/2018   AST 15 07/11/2018   ALKPHOS 91 07/11/2018   BILITOT 0.6 07/11/2018

## 2018-07-12 NOTE — Assessment & Plan Note (Signed)
Improving  control.  Reminder for annual diabetic eye exam given..  Foot exam done. Meds reviewed and she is on a baby aspirin daily., a statin and an ACE inhibitor.  Urine tested for protein.  Continue  Glipizide  5 mg in the am and at night. continue metformin 1000 mg bid.   Lab Results  Component Value Date   MICROALBUR 1.0 01/02/2018   Lab Results  Component Value Date   HGBA1C 6.4 (A) 07/11/2018

## 2018-07-12 NOTE — Assessment & Plan Note (Signed)
Well controlled on current regimen. Renal function stable, no changes today.  Lab Results  Component Value Date   CREATININE 1.00 07/11/2018   Lab Results  Component Value Date   NA 140 07/11/2018   K 3.6 07/11/2018   CL 105 07/11/2018   CO2 26 07/11/2018

## 2018-07-12 NOTE — Assessment & Plan Note (Addendum)
Suspect ruptured Baker's Cyst vs bursitis or sprained ligament.  Referral to [facility]  Trial of celebrex and tylenol.  vicodin for evening use.

## 2018-07-17 ENCOUNTER — Ambulatory Visit (INDEPENDENT_AMBULATORY_CARE_PROVIDER_SITE_OTHER)
Admission: RE | Admit: 2018-07-17 | Discharge: 2018-07-17 | Disposition: A | Discharge status: Home / Self Care | Payer: BC Managed Care – PPO | Source: Ambulatory Visit | Attending: Family Medicine | Admitting: Family Medicine

## 2018-07-17 ENCOUNTER — Ambulatory Visit: Payer: Self-pay

## 2018-07-17 ENCOUNTER — Ambulatory Visit: Payer: BC Managed Care – PPO | Admitting: Family Medicine

## 2018-07-17 ENCOUNTER — Encounter: Payer: Self-pay | Admitting: Family Medicine

## 2018-07-17 VITALS — BP 138/72 | HR 85 | Ht 65.0 in | Wt 237.0 lb

## 2018-07-17 DIAGNOSIS — G8929 Other chronic pain: Secondary | ICD-10-CM

## 2018-07-17 DIAGNOSIS — M25561 Pain in right knee: Principal | ICD-10-CM

## 2018-07-17 DIAGNOSIS — M79604 Pain in right leg: Secondary | ICD-10-CM | POA: Diagnosis not present

## 2018-07-17 MED ORDER — VITAMIN D (ERGOCALCIFEROL) 1.25 MG (50000 UNIT) PO CAPS: 50000.0000 [IU] | ORAL_CAPSULE | ORAL | 0 refills | Status: DC

## 2018-07-17 NOTE — Assessment & Plan Note (Signed)
It does appear on ultrasound the patient potentially has a potential loose body.  There is a degenerative tear of the medial meniscus that also could be contributing to some of the mild intermittent instability that patient feels.  X-rays ordered today.  We will get Doppler to rule out any type of deep venous thrombosis but lower likelihood.  We discussed with patient about icing regimen, topical anti-inflammatories, which activities to do which wants to avoid.  Follow-up again in 4 weeks.  Worsening symptoms consider injection

## 2018-07-17 NOTE — Patient Instructions (Addendum)
Good to see you.  Xray downstairs Ice 20 minutes 2 times daily. Usually after activity and before bed. pennsaid pinkie amount topically 2 times daily as needed.  Stay active  Exercises 3 times a week.   Stop daily vitamin D and start once weekly vitamin D for 12 weeks.  Good shoes with rigid bottom.  Cheryl Wallace, [place] or New balance greater then 700 but orthofeet should do well.  See me again in 4 weeks as scheduled

## 2018-07-17 NOTE — Progress Notes (Signed)
[name] [address] [place], [place] [address] Phone: [phone] Subjective:    I Cheryl Wallace am serving as a Education administrator for Dr. Hulan Saas.   I'm seeing this patient by the request  of:  Crecencio Mc, MD   CC: Right knee pain  [id number]  Cheryl Wallace is a 67 y.o. female coming in with complaint of right knee pain. Remembers her knee popping on a cruise. Popped a second time once she returned. Leg pain when the knee is most painful. No loss of ROM.  Patient recently did have a intercontinental travel  Onset- 1st week of August  Location- posterior and medial Character- Sharp Aggravating factors- walking  Reliving factors- Ice, heat, topical, orals  Severity-7 out of 10 Has been traveling recently    Past Medical History:  Diagnosis Date  . Allergic rhinitis   . Complication of anesthesia 1981   While pt was under anesthesia, pt had an asthema attack during BTL.  . Depression, major, single episode, mild ([place]) 09/04/2016  . Elbow tendonitis    a. bilat, s/p surgery.  . Essential hypertension   . Functional ovarian cysts 2011   a. s/p TAH (1994);  b. s/p oopherectomy (~2011)  . Gallstones   . GERD (gastroesophageal reflux disease)   . Kidney stones 2011   a. s/p lithotripsy  . Low grade fever   . Obesity   . Tinnitus   . Type 2 diabetes mellitus ([place])    a.  Hemoglobin A1c in July 2016, 6.7.   Past Surgical History:  Procedure Laterality Date  . ABDOMINAL HYSTERECTOMY  1994  . COLONOSCOPY  September 2008   Non-bleeding internal hemorhoids identified.  . ELBOW SURGERY    . NASAL SINUS SURGERY  09/18/2015   Procedure: IMAGE GUIDED SINUS SURGERY, BILATERAL MAXILLARY BALLOON SINUPLASTY, BILATERAL FRONTAL BALLOON SINUPLASTY, RIGHT SPHENOID  SINUPLASTY, RIGHT CONCHABULLOSA RESECTION;  Surgeon: Carloyn Manner, MD;  Location: [facility];  Service: ENT;;  . 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  OPEN REDUCTION INTERNAL FIXATION (ORIF) DISTAL RADIAL FRACTURE Right 02/07/2017   Procedure: OPEN REDUCTION INTERNAL FIXATION (ORIF) DISTAL RADIAL FRACTURE;  Surgeon: Hessie Knows, MD;  Location: [facility];  Service: Orthopedics;  Laterality: Right;  . PARTIAL HYSTERECTOMY  1994   still has ovaries, removed for fibroids  . TONSILLECTOMY N/A 09/18/2015   Procedure: TONSILLECTOMY;  Surgeon: Carloyn Manner, MD;  Location: [facility];  Service: ENT;  Laterality: N/A;  . TUBAL LIGATION    . UPPER GI ENDOSCOPY  2011   Social History   Socioeconomic History  . Marital status: Divorced    Spouse name: Not on file  . Number of children: Not on file  . Years of education: Not on file  . Highest education level: Not on file  Occupational History  . Not on file  Social Needs  . Financial resource strain: Not on file  . Food insecurity:    Worry: Not on file    Inability: Not on file  . Transportation needs:    Medical: Not on file    Non-medical: Not on file  Tobacco Use  . Smoking status: Former Smoker    Packs/day: 1.50    Years: 10.00    Pack years: 15.00    Types: Cigarettes    Last attempt to quit: 09/05/1984    Years since quitting: 33.8  . Smokeless tobacco: Never Used  . Tobacco  comment: remote, quit 30 years ago  Substance and Sexual Activity  . Alcohol use: Yes    Alcohol/week: 0.0 standard drinks    Comment: on occasion, martinis maybe twice a year (Christmas & New Years).  . Drug use: No  . Sexual activity: Not on file  Lifestyle  . Physical activity:    Days per week: Not on file    Minutes per session: Not on file  . Stress: Not on file  Relationships  . Social connections:    Talks on phone: Not on file    Gets together: Not on file    Attends religious service: Not on file    Active member of club or organization: Not on file    Attends meetings of clubs or organizations: Not on file    Relationship status: Not on file  Other Topics Concern  . Not on file    Social History Narrative   Lives with grandson in [place].  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  Does not routinely exercise.  Always uses seat belts.  Has well water.   Allergies  Allergen Reactions  . Lisinopril Cough    cough  . Shellfish Allergy Swelling    Swelling of face, lips and hands. No difficulty breathing.  [name] Penicillins Rash   Family History  Problem Relation Age of Onset  . Cancer Mother        colon  . Tuberculosis Father   . Diabetes Father   . Heart disease Father        MI x 2  . Heart attack Father   . Diabetes Sister        half sister    Current Outpatient Medications (Endocrine & Metabolic):  .  glipiZIDE (GLUCOTROL) 5 MG tablet, TAKE 1 TABLET (5 MG TOTAL) BY MOUTH 2 (TWO) TIMES DAILY BEFORE A MEAL. .  metFORMIN (GLUCOPHAGE) 1000 MG tablet, TAKE 1 TABLET BY MOUTH 2 TIMES DAILY WITH A MEAL.  Current Outpatient Medications (Cardiovascular):  .  amLODipine (NORVASC) 5 MG tablet, TAKE 1 TABLET (5 MG TOTAL) BY MOUTH DAILY. [name]  atorvastatin (LIPITOR) 20 MG tablet, TAKE 1 TABLET BY MOUTH EVERY DAY .  losartan (COZAAR) 50 MG tablet, TAKE 2 TABLET (50 MG TOTAL) BY MOUTH DAILY.  Current Outpatient Medications (Respiratory):  .  fluticasone (FLONASE) 50 MCG/ACT nasal spray, SPRAY 2 SPRAYS INTO EACH NOSTRIL EVERY DAY .  ipratropium (ATROVENT) 0.03 % nasal spray, PLACE 2 SPRAYS INTO BOTH NOSTRILS 2 (TWO) TIMES DAILY  Current Outpatient Medications (Analgesics):  .  celecoxib (CELEBREX) 200 MG capsule, Take 1 capsule (200 mg total) by mouth 2 (two) times daily. [name]  etodolac (LODINE) 500 MG tablet, Take 500 mg by mouth 2 (two) times daily. [name]  HYDROcodone-acetaminophen (NORCO) 10-325 MG tablet, Take 1 tablet by mouth every 6 (six) hours as needed. .  traMADol (ULTRAM) 50 MG tablet, Take 1 tablet (50 mg total) by mouth every 8 (eight) hours as needed.   Current Outpatient Medications (Other):  .  cholecalciferol (VITAMIN D) 1000 UNITS tablet, Take 1,000  Units by mouth daily.  .  famotidine (PEPCID) 20 MG tablet, TAKE 1 TABLET (20 MG TOTAL) BY MOUTH 2 (TWO) TIMES DAILY. [name]  omeprazole (PRILOSEC) 40 MG capsule, Take 1 capsule (40 mg total) by mouth daily. [name]  scopolamine (TRANSDERM-SCOP, 1.5 MG,) 1 MG/3DAYS, Place 1 patch (1.5 mg total) onto the skin every 3 (three) days. [name]  zolpidem (AMBIEN) 5 MG tablet, TAKE 1  TABLET BY MOUTH AT BEDTIME AS NEEDED .  Vitamin D, Ergocalciferol, (DRISDOL) 1.25 MG (50000 UT) CAPS capsule, Take 1 capsule (50,000 Units total) by mouth every 7 (seven) days.    Past medical history, social, surgical and family history all reviewed in electronic medical record.  No pertanent information unless stated regarding to the chief complaint.   Review of Systems:  No headache, visual changes, nausea, vomiting, diarrhea, constipation, dizziness, abdominal pain, skin rash, fevers, chills, night sweats, weight loss, swollen lymph nodes, body aches, joint swelling,  chest pain, shortness of breath, mood changes.  Positive muscle aches  Objective  Blood pressure 138/72, pulse 85, height 5\' 5"  (1.651 m), weight 237 lb (107.5 kg), SpO2 97 %.   General: No apparent distress alert and oriented x3 mood and affect normal, dressed appropriately.  HEENT: Pupils equal, extraocular movements intact  Respiratory: Patient's speak in full sentences and does not appear short of breath  Cardiovascular: No lower extremity edema, non tender, no erythema  Skin: Warm dry intact with no signs of infection or rash on extremities or on axial skeleton.  Abdomen: Soft nontender  Neuro: Cranial nerves II through XII are intact, neurovascularly intact in all extremities with 2+ DTRs and 2+ pulses.  Lymph: No lymphadenopathy of posterior or anterior cervical chain or axillae bilaterally.  Gait antalgic MSK:  Non tender with full range of motion and good stability and symmetric strength and tone of shoulders, elbows, wrist, hip, and ankles bilaterally.    Right knee exam shows the patient has some some mild arthritic changes with mild varus deformity.  Patient does have some very mild instability noted with McMurray's and varus force.  Patient does have mild crepitus with range of motion.  Patient is severely tender to palpation with compression.   MSK US performed of: Right knee This study was ordered, performed, and interpreted by Charlann Boxer D.O.  Knee: Moderate degenerative changes noted of the medial meniscus Patellar Tendon unremarkable on long and transverse views without effusion. No abnormality of prepatellar bursa. LCL and MCL unremarkable on long and transverse views. No abnormality of origin of medial or lateral head of the gastrocnemius.  IMPRESSION: Degenerative meniscal tear    Impression and Recommendations:     The above documentation has been reviewed and is accurate and complete Lyndal Pulley, DO       Note: This dictation was prepared with Dragon dictation along with smaller phrase technology. Any transcriptional errors that result from this process are unintentional.

## 2018-07-18 ENCOUNTER — Ambulatory Visit (INDEPENDENT_AMBULATORY_CARE_PROVIDER_SITE_OTHER): Payer: BC Managed Care – PPO

## 2018-07-18 ENCOUNTER — Ambulatory Visit: Payer: BC Managed Care – PPO

## 2018-07-18 DIAGNOSIS — M79604 Pain in right leg: Secondary | ICD-10-CM

## 2018-07-19 ENCOUNTER — Encounter: Payer: Self-pay | Admitting: Family Medicine

## 2018-07-22 ENCOUNTER — Other Ambulatory Visit: Payer: Self-pay | Admitting: Internal Medicine

## 2018-07-31 NOTE — Telephone Encounter (Signed)
LMTCB. Need to schedule pt for a nurse visit for b12 injection. PEC may speak with pt.

## 2018-08-02 ENCOUNTER — Other Ambulatory Visit: Payer: Self-pay | Admitting: Internal Medicine

## 2018-08-08 NOTE — Progress Notes (Signed)
[facility] [address] [place], [place] [address] Phone: [phone] Subjective:   [name], am serving as a scribe for Dr. Hulan Saas. I'm seeing this patient by the request  of:    CC: Knee pain follow-up  [id number]  Cheryl Wallace is a 67 y.o. female coming in with complaint of knee pain. She does feel better. She said that she did take vitamin d. Patient does still have pain at the end of the day on the medial aspect of the knee.  Patient states that overall approximately 75% better.  She has not noticed any swelling.  Knee has not given out on her once.  Overall feels like making progress      Past Medical History:  Diagnosis Date  . Allergic rhinitis   . Complication of anesthesia 1981   While pt was under anesthesia, pt had an asthema attack during BTL.  . Depression, major, single episode, mild ([place]) 09/04/2016  . Elbow tendonitis    a. bilat, s/p surgery.  . Essential hypertension   . Functional ovarian cysts 2011   a. s/p TAH (1994);  b. s/p oopherectomy (~2011)  . Gallstones   . GERD (gastroesophageal reflux disease)   . Kidney stones 2011   a. s/p lithotripsy  . Low grade fever   . Obesity   . Tinnitus   . Type 2 diabetes mellitus ([place])    a.  Hemoglobin A1c in July 2016, 6.7.   Past Surgical History:  Procedure Laterality Date  . ABDOMINAL HYSTERECTOMY  1994  . COLONOSCOPY  September 2008   Non-bleeding internal hemorhoids identified.  . ELBOW SURGERY    . NASAL SINUS SURGERY  09/18/2015   Procedure: IMAGE GUIDED SINUS SURGERY, BILATERAL MAXILLARY BALLOON SINUPLASTY, BILATERAL FRONTAL BALLOON SINUPLASTY, RIGHT SPHENOID  SINUPLASTY, RIGHT CONCHABULLOSA RESECTION;  Surgeon: Carloyn Manner, MD;  Location: [facility];  Service: ENT;;  . Morene Wallace  04/2010   bilateral, 7 cm benign tumor left ovary  . OPEN REDUCTION INTERNAL FIXATION (ORIF) DISTAL RADIAL FRACTURE Right 02/07/2017   Procedure: OPEN REDUCTION INTERNAL  FIXATION (ORIF) DISTAL RADIAL FRACTURE;  Surgeon: Hessie Knows, MD;  Location: [facility];  Service: Orthopedics;  Laterality: Right;  . PARTIAL HYSTERECTOMY  1994   still has ovaries, removed for fibroids  . TONSILLECTOMY N/A 09/18/2015   Procedure: TONSILLECTOMY;  Surgeon: Carloyn Manner, MD;  Location: [facility];  Service: ENT;  Laterality: N/A;  . TUBAL LIGATION    . UPPER GI ENDOSCOPY  2011   Social History   Socioeconomic History  . Marital status: Divorced    Spouse name: Not on file  . Number of children: Not on file  . Years of education: Not on file  . Highest education level: Not on file  Occupational History  . Not on file  Social Needs  . Financial resource strain: Not on file  . Food insecurity:    Worry: Not on file    Inability: Not on file  . Transportation needs:    Medical: Not on file    Non-medical: Not on file  Tobacco Use  . Smoking status: Former Smoker    Packs/day: 1.50    Years: 10.00    Pack years: 15.00    Types: Cigarettes    Last attempt to quit: 09/05/1984    Years since quitting: 33.9  . Smokeless tobacco: Never Used  . Tobacco comment: remote, quit 30 years ago  Substance and Sexual Activity  .  Alcohol use: Yes    Alcohol/week: 0.0 standard drinks    Comment: on occasion, martinis maybe twice a year (Christmas & New Years).  . Drug use: No  . Sexual activity: Not on file  Lifestyle  . Physical activity:    Days per week: Not on file    Minutes per session: Not on file  . Stress: Not on file  Relationships  . Social connections:    Talks on phone: Not on file    Gets together: Not on file    Attends religious service: Not on file    Active member of club or organization: Not on file    Attends meetings of clubs or organizations: Not on file    Relationship status: Not on file  Other Topics Concern  . Not on file  Social History Narrative   Lives with grandson in [place].  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  Always uses seat belts.  Has well water.   Allergies  Allergen Reactions  . Lisinopril Cough    cough  . Shellfish Allergy Swelling    Swelling of face, lips and hands. No difficulty breathing.  Cheryl Wallace Penicillins Rash   Family History  Problem Relation Age of Onset  . Cancer Mother        colon  . Tuberculosis Father   . Diabetes Father   . Heart disease Father        MI x 2  . Heart attack Father   . Diabetes Sister        half sister    Current Outpatient Medications (Endocrine & Metabolic):  .  glipiZIDE (GLUCOTROL) 5 MG tablet, TAKE 1 TABLET (5 MG TOTAL) BY MOUTH 2 (TWO) TIMES DAILY BEFORE A MEAL. .  metFORMIN (GLUCOPHAGE) 1000 MG tablet, TAKE 1 TABLET BY MOUTH 2 TIMES DAILY WITH A MEAL.  Current Outpatient Medications (Cardiovascular):  .  amLODipine (NORVASC) 5 MG tablet, TAKE 1 TABLET (5 MG TOTAL) BY MOUTH DAILY. Cheryl Wallace  atorvastatin (LIPITOR) 20 MG tablet, TAKE 1 TABLET BY MOUTH EVERY DAY .  losartan (COZAAR) 50 MG tablet, TAKE 2 TABLET (50 MG TOTAL) BY MOUTH DAILY.  Current Outpatient Medications (Respiratory):  .  fluticasone (FLONASE) 50 MCG/ACT nasal spray, SPRAY 2 SPRAYS INTO EACH NOSTRIL EVERY DAY .  ipratropium (ATROVENT) 0.03 % nasal spray, PLACE 2 SPRAYS INTO BOTH NOSTRILS 2 (TWO) TIMES DAILY  Current Outpatient Medications (Analgesics):  .  celecoxib (CELEBREX) 200 MG capsule, TAKE 1 CAPSULE BY MOUTH TWICE A DAY .  etodolac (LODINE) 500 MG tablet, Take 500 mg by mouth 2 (two) times daily. Cheryl Wallace  HYDROcodone-acetaminophen (NORCO) 10-325 MG tablet, Take 1 tablet by mouth every 6 (six) hours as needed. .  traMADol (ULTRAM) 50 MG tablet, Take 1 tablet (50 mg total) by mouth every 8 (eight) hours as needed.   Current Outpatient Medications (Other):  .  cholecalciferol (VITAMIN D) 1000 UNITS tablet, Take 1,000 Units by mouth daily.  .  famotidine (PEPCID) 20 MG tablet, TAKE 1 TABLET (20 MG TOTAL) BY MOUTH 2 (TWO) TIMES DAILY. Cheryl Wallace  omeprazole (PRILOSEC)  40 MG capsule, TAKE 1 CAPSULE BY MOUTH EVERY DAY .  scopolamine (TRANSDERM-SCOP, 1.5 MG,) 1 MG/3DAYS, Place 1 patch (1.5 mg total) onto the skin every 3 (three) days. .  Vitamin D, Ergocalciferol, (DRISDOL) 1.25 MG (50000 UT) CAPS capsule, Take 1 capsule (50,000 Units total) by mouth every 7 (seven) days. Cheryl Wallace  zolpidem (AMBIEN) 5  MG tablet, TAKE 1 TABLET BY MOUTH AT BEDTIME AS NEEDED    Past medical history, social, surgical and family history all reviewed in electronic medical record.  No pertanent information unless stated regarding to the chief complaint.   Review of Systems:  No headache, visual changes, nausea, vomiting, diarrhea, constipation, dizziness, abdominal pain, skin rash, fevers, chills, night sweats, weight loss, swollen lymph nodes, body aches, joint swelling, muscle aches, chest pain, shortness of breath, mood changes.   Objective  There were no vitals taken for this visit. Systems examined below as of    General: No apparent distress alert and oriented x3 mood and affect normal, dressed appropriately.  HEENT: Pupils equal, extraocular movements intact  Respiratory: Patient's speak in full sentences and does not appear short of breath  Cardiovascular: No lower extremity edema, non tender, no erythema  Skin: Warm dry intact with no signs of infection or rash on extremities or on axial skeleton.  Abdomen: Soft nontender  Neuro: Cranial nerves II through XII are intact, neurovascularly intact in all extremities with 2+ DTRs and 2+ pulses.  Lymph: No lymphadenopathy of posterior or anterior cervical chain or axillae bilaterally.  Gait normal with good balance and coordination.  MSK:  Non tender with full range of motion and good stability and symmetric strength and tone of shoulders, elbows, wrist, hip, and ankles bilaterally.  Knee: Right Normal to inspection with no erythema or effusion or obvious bony abnormalities. . ROM full in flexion and extension and lower leg  rotation. Ligaments with solid consistent endpoints including ACL, PCL, LCL, MCL. Mild positive Mcmurray's, Apley's, and [place]ss[place]onian tests. Non painful patellar compression. Patellar glide with mild crepitus. Patellar and quadriceps tendons unremarkable. Hamstring and quadriceps strength is normal.   Impression and Recommendations:      The above documentation has been reviewed and is accurate and complete Lyndal Pulley, DO       Note: This dictation was prepared with Dragon dictation along with smaller phrase technology. Any transcriptional errors that result from this process are unintentional.

## 2018-08-09 ENCOUNTER — Ambulatory Visit: Payer: BC Managed Care – PPO | Admitting: Family Medicine

## 2018-08-09 ENCOUNTER — Encounter: Payer: Self-pay | Admitting: Family Medicine

## 2018-08-09 DIAGNOSIS — M25561 Pain in right knee: Secondary | ICD-10-CM

## 2018-08-09 NOTE — Patient Instructions (Signed)
6 weeks Happy holidays!

## 2018-08-09 NOTE — Assessment & Plan Note (Signed)
Found to have more of a degenerative knee tear of the meniscus.  Patient has been doing relatively well though.  Patient has made improvement.  No change in management.  Follow-up again in 6 weeks

## 2018-09-24 ENCOUNTER — Other Ambulatory Visit: Payer: Self-pay | Admitting: Internal Medicine

## 2018-09-24 DIAGNOSIS — E785 Hyperlipidemia, unspecified: Principal | ICD-10-CM

## 2018-09-24 DIAGNOSIS — E1169 Type 2 diabetes mellitus with other specified complication: Secondary | ICD-10-CM

## 2018-09-25 ENCOUNTER — Other Ambulatory Visit: Payer: Self-pay | Admitting: Internal Medicine

## 2018-09-25 ENCOUNTER — Ambulatory Visit: Payer: BC Managed Care – PPO | Admitting: Family Medicine

## 2018-10-04 ENCOUNTER — Other Ambulatory Visit: Payer: Self-pay | Admitting: Family Medicine

## 2018-10-11 ENCOUNTER — Encounter: Payer: Self-pay | Admitting: Internal Medicine

## 2018-10-11 ENCOUNTER — Ambulatory Visit: Payer: BC Managed Care – PPO | Admitting: Internal Medicine

## 2018-10-11 VITALS — BP 150/70 | HR 77 | Temp 98.7°F | Resp 17 | Ht 65.0 in | Wt 239.8 lb

## 2018-10-11 DIAGNOSIS — F320 Major depressive disorder, single episode, mild: Secondary | ICD-10-CM

## 2018-10-11 DIAGNOSIS — M2550 Pain in unspecified joint: Secondary | ICD-10-CM | POA: Diagnosis not present

## 2018-10-11 DIAGNOSIS — J0100 Acute maxillary sinusitis, unspecified: Secondary | ICD-10-CM

## 2018-10-11 DIAGNOSIS — E1121 Type 2 diabetes mellitus with diabetic nephropathy: Secondary | ICD-10-CM

## 2018-10-11 MED ORDER — CEFDINIR 300 MG PO CAPS: 300.0000 mg | ORAL_CAPSULE | Freq: Two times a day (BID) | ORAL | 0 refills | Status: DC

## 2018-10-11 MED ORDER — GUAIFENESIN-CODEINE 100-10 MG/5ML PO SYRP: 5.0000 mL | ORAL_SOLUTION | Freq: Three times a day (TID) | ORAL | 0 refills | Status: DC | PRN

## 2018-10-11 MED ORDER — PREDNISONE 10 MG PO TABS: ORAL_TABLET | ORAL | 0 refills | Status: DC

## 2018-10-11 MED ORDER — DULOXETINE HCL 20 MG PO CPEP: 20.0000 mg | ORAL_CAPSULE | Freq: Every day | ORAL | 3 refills | Status: DC

## 2018-10-11 NOTE — Progress Notes (Signed)
Subjective:  Patient ID: Cheryl Wallace, female    DOB: 24-Jan-1951  Age: 68 y.o. MRN: [id number]  CC: The primary encounter diagnosis was Type 2 diabetes mellitus with nephropathy ([place]). Diagnoses of Polyarthralgia, Depression, major, single episode, mild ([place]), and Acute non-recurrent maxillary sinusitis were also pertinent to this visit.  HPI Cheryl Wallace presents for 3 month follow up on diabetes.    Patient  has been feeling poorly since Saturday.  Started with sore throat,  Sinus congestion and rhinitis.  Left maxillary pain began one day later.   Positive screen for depression. Several months in duration.  remote trial of something 10 yrs ago ("made me jump out of my skin"  ), denies history of manic episodes,  No real anxiety or panic attacks. No suicidal thoughts .  Has constant pain in both legs,  Both arms.   Has not taken tramadol in months,  Took it in the morning only ,  Did not feel that it helped.   Needs referral to [facility] clinic for colonoscopy     Patient is following a low glycemic index diet and taking all prescribed medications regularly without side effects.  Fasting sugars have been under 140 most of the time and post prandials have been under 160 except on rare occasions. Patient is not exercising due to chronc pain  or intentionally trying to lose weight .  Patient has had an eye exam in the last 12 months and checks feet regularly for signs of infection.  Patient does not walk barefoot outside,  And denies an numbness tingling or burning in feet. Patient is up to date on all recommended vaccinations  Outpatient Medications Prior to Visit  Medication Sig Dispense Refill  . amLODipine (NORVASC) 5 MG tablet TAKE 1 TABLET (5 MG TOTAL) BY MOUTH DAILY. 90 tablet 1  . atorvastatin (LIPITOR) 20 MG tablet TAKE 1 TABLET BY MOUTH EVERY DAY 90 tablet 1  . cholecalciferol (VITAMIN D) 1000 UNITS tablet Take 1,000 Units by mouth daily.     [name] glipiZIDE (GLUCOTROL) 5 MG tablet  TAKE 1 TABLET (5 MG TOTAL) BY MOUTH 2 (TWO) TIMES DAILY BEFORE A MEAL. 180 tablet 1  . losartan (COZAAR) 50 MG tablet TAKE 2 TABLET (50 MG TOTAL) BY MOUTH DAILY. 180 tablet 1  . metFORMIN (GLUCOPHAGE) 1000 MG tablet TAKE 1 TABLET BY MOUTH 2 TIMES DAILY WITH A MEAL. 180 tablet 1  . omeprazole (PRILOSEC) 40 MG capsule TAKE 1 CAPSULE BY MOUTH EVERY DAY 90 capsule 1  . zolpidem (AMBIEN) 5 MG tablet TAKE 1 TABLET BY MOUTH AT BEDTIME AS NEEDED 30 tablet 5  . etodolac (LODINE) 500 MG tablet Take 500 mg by mouth 2 (two) times daily.  1  . famotidine (PEPCID) 20 MG tablet TAKE 1 TABLET (20 MG TOTAL) BY MOUTH 2 (TWO) TIMES DAILY.  2  . celecoxib (CELEBREX) 200 MG capsule TAKE 1 CAPSULE BY MOUTH TWICE A DAY (Patient not taking: Reported on 10/11/2018) 60 capsule 0  . fluticasone (FLONASE) 50 MCG/ACT nasal spray SPRAY 2 SPRAYS INTO EACH NOSTRIL EVERY DAY (Patient not taking: Reported on 10/11/2018) 16 g 4  . HYDROcodone-acetaminophen (NORCO) 10-325 MG tablet Take 1 tablet by mouth every 6 (six) hours as needed. (Patient not taking: Reported on 10/11/2018) 30 tablet 0  . ipratropium (ATROVENT) 0.03 % nasal spray PLACE 2 SPRAYS INTO BOTH NOSTRILS 2 (TWO) TIMES DAILY  11  . scopolamine (TRANSDERM-SCOP, 1.5 MG,) 1 MG/3DAYS Place 1 patch (1.5 mg  total) onto the skin every 3 (three) days. (Patient not taking: Reported on 10/11/2018) 8 patch 0  . traMADol (ULTRAM) 50 MG tablet Take 1 tablet (50 mg total) by mouth every 8 (eight) hours as needed. (Patient not taking: Reported on 10/11/2018) 60 tablet 5  . Vitamin D, Ergocalciferol, (DRISDOL) 1.25 MG (50000 UT) CAPS capsule TAKE 1 CAPSULE (50,000 UNITS TOTAL) BY MOUTH EVERY 7 (SEVEN) DAYS. (Patient not taking: Reported on 10/11/2018) 12 capsule 0   No facility-administered medications prior to visit.     Review of Systems;  Patient denies, fevers,, unintentional weight loss, skin rash, eye pain , dysphagia,  hemoptysis , cough, dyspnea, wheezing, chest pain, palpitations,  orthopnea, edema, abdominal pain, nausea, melena, diarrhea, constipation, flank pain, dysuria, hematuria, urinary  Frequency, nocturia, numbness, tingling, seizures,  Focal weakness, Loss of consciousness,  Tremor, insomnia,  anxiety, and suicidal ideation.      Objective:  BP (!) 150/70   Pulse 77   Temp 98.7 F (37.1 C)   Resp 17   Ht 5\' 5"  (1.651 m)   Wt 239 lb 12.8 oz (108.8 kg)   SpO2 94%   BMI 39.90 kg/m   BP Readings from Last 3 Encounters:  10/11/18 (!) 150/70  08/09/18 110/72  07/17/18 138/72    Wt Readings from Last 3 Encounters:  10/11/18 239 lb 12.8 oz (108.8 kg)  08/09/18 236 lb (107 kg)  07/17/18 237 lb (107.5 kg)    General appearance: alert, cooperative and appears depressed.  Appears stated age Ears: left TM injected,  No effusion right TM normal,  external ear canals both ears Face: left maxillary sinus tenderness  Throat: lips, mucosa, and tongue normal; teeth and gums normal Neck: no adenopathy, no carotid bruit, supple, symmetrical, trachea midline and thyroid not enlarged, symmetric, no tenderness/mass/nodules Back: symmetric, no curvature. ROM normal. No CVA tenderness. Lungs: clear to auscultation bilaterally Heart: regular rate and rhythm, S1, S2 normal, no murmur, click, rub or gallop Abdomen: soft, non-tender; bowel sounds normal; no masses,  no organomegaly Pulses: 2+ and symmetric Skin: Skin color, texture, turgor normal. No rashes or lesions Lymph nodes: Cervical LAD, tender;  supraclavicular, and axillary nodes normal.  Lab Results  Component Value Date   HGBA1C 6.7 (H) 10/11/2018   HGBA1C 6.4 (A) 07/11/2018   HGBA1C 7.3 (H) 04/05/2018    Lab Results  Component Value Date   CREATININE 0.92 10/11/2018   CREATININE 1.00 07/11/2018   CREATININE 0.97 04/05/2018    Lab Results  Component Value Date   WBC 7.3 04/05/2018   HGB 11.1 (L) 04/05/2018   HCT 35.3 (L) 04/05/2018   PLT 275.0 04/05/2018   GLUCOSE 72 10/11/2018   CHOL 103  10/11/2018   TRIG 129.0 10/11/2018   HDL 35.40 (L) 10/11/2018   LDLDIRECT 52.0 09/26/2017   LDLCALC 42 10/11/2018   ALT 12 10/11/2018   AST 12 10/11/2018   NA 141 10/11/2018   K 4.2 10/11/2018   CL 106 10/11/2018   CREATININE 0.92 10/11/2018   BUN 17 10/11/2018   CO2 29 10/11/2018   TSH 2.59 04/05/2018   HGBA1C 6.7 (H) 10/11/2018   MICROALBUR 3.5 (H) 10/11/2018    [place] Limited Joint Space Structures Low Right  Result Date: 07/23/2018 MSK US performed of: Right knee This study was ordered, performed, and interpreted by Charlann Boxer D.O. Knee: Moderate degenerative changes noted of the medial meniscus Patellar Tendon unremarkable on long and transverse views without effusion. No abnormality of prepatellar bursa. LCL  and MCL unremarkable on long and transverse views. No abnormality of origin of medial or lateral head of the gastrocnemius.   Degenerative meniscal tear   Dg Knee Complete 4 Views Right  Result Date: 07/17/2018 CLINICAL DATA:  Pain, swelling at medial aspect of knee. EXAM: RIGHT KNEE - COMPLETE 4+ VIEW COMPARISON:  None. FINDINGS: Degenerative changes with joint space narrowing and spurring most pronounced in the patellofemoral compartment. Small joint effusion. Intra-articular loose bodies noted posteriorly over the knee joint. No acute bony abnormality. Specifically, no fracture, subluxation, or dislocation. IMPRESSION: Mild degenerative changes with posterior intra-articular loose bodies. Small joint effusion. No acute bony abnormality. Electronically Signed   By: Rolm Baptise M.D.   On: 07/17/2018 10:19   Vas [place] Lower Extremity Venous (dvt)  Result Date: 07/18/2018  Lower Venous Study Indications: Pain.  Performing Technologist: Blondell Reveal RT, RDMS, RVT  Examination Guidelines: A complete evaluation includes B-mode imaging, spectral Doppler, color Doppler, and power Doppler as needed of all accessible portions of each vessel. Bilateral testing is considered an integral  part of a complete examination. Limited examinations for reoccurring indications may be performed as noted.  Right Venous Findings: +---------+---------------+---------+-----------+----------+-------+          CompressibilityPhasicitySpontaneityPropertiesSummary +---------+---------------+---------+-----------+----------+-------+ CFV      Full           Yes      Yes                          +---------+---------------+---------+-----------+----------+-------+ SFJ      Full                                                 +---------+---------------+---------+-----------+----------+-------+ FV Prox  Full                                                 +---------+---------------+---------+-----------+----------+-------+ FV Mid   Full           Yes      Yes                          +---------+---------------+---------+-----------+----------+-------+ FV DistalFull                                                 +---------+---------------+---------+-----------+----------+-------+ POP      Full           Yes      Yes                          +---------+---------------+---------+-----------+----------+-------+ PTV      Full                                                 +---------+---------------+---------+-----------+----------+-------+ PERO     Full                                                 +---------+---------------+---------+-----------+----------+-------+  Gastroc  Full                                                 +---------+---------------+---------+-----------+----------+-------+ GSV      Full                                                 +---------+---------------+---------+-----------+----------+-------+ SSV      Full                                                 +---------+---------------+---------+-----------+----------+-------+  Left Venous Findings: +---+---------------+---------+-----------+----------+-------+     CompressibilityPhasicitySpontaneityPropertiesSummary +---+---------------+---------+-----------+----------+-------+ CFVFull           Yes      Yes                          +---+---------------+---------+-----------+----------+-------+  Summary: Right: No evidence of deep vein thrombosis in the lower extremity. No indirect evidence of obstruction proximal to the inguinal ligament. There is no evidence of superficial venous thrombosis. Left: No evidence of common femoral vein obstruction.  *See table(s) above for measurements and observations. Electronically signed by Leotis Pain MD on 07/18/2018 at 12:34:56 PM.    Final     Assessment & Plan:   Problem List Items Addressed This Visit    Type 2 diabetes mellitus with nephropathy ([place]) - Primary    Well controlled on current regimen .  Reminder for annual diabetic eye exam given..  Foot exam done. Meds reviewed and she is on a baby aspirin daily., a statin and an ACE inhibitor for proteinuria   Continue  Glipizide  5 mg in the am and at night. continue metformin 1000 mg bid.   Lab Results  Component Value Date   MICROALBUR 3.5 (H) 10/11/2018   Lab Results  Component Value Date   HGBA1C 6.7 (H) 10/11/2018         Relevant Orders   Hemoglobin A1c (Completed)   Comprehensive metabolic panel (Completed)   Lipid panel (Completed)   Microalbumin / creatinine urine ratio (Completed)   Sinusitis, acute maxillary    Omnicef, prednisone taper,  Avoid decongestants given elevated BP reading      Relevant Medications   cefdinir (OMNICEF) 300 MG capsule   predniSONE (DELTASONE) 10 MG tablet   guaiFENesin-codeine (CHERATUSSIN AC) 100-10 MG/5ML syrup   Polyarthralgia     She again has no signs of inflammatory arthritis on exam today. She has DJD of several joints.  No longer taking etodolac, or tramadol.  Prednisone taper for sinusitis,  followed by celebrex and  tylenol       Depression, major, single episode, mild ([facility])    Recurrent,   Aggravated by chronic pain.  Trial of Cymbalta starting at 20 mg daily       Relevant Medications   DULoxetine (CYMBALTA) 20 MG capsule      I have discontinued Peityn F. Markov's etodolac, traMADol, fluticasone, scopolamine, ipratropium, famotidine, HYDROcodone-acetaminophen, celecoxib, and Vitamin D (Ergocalciferol). I am also having her start on cefdinir, predniSONE, guaiFENesin-codeine, and DULoxetine. Additionally,  I am having her maintain her cholecalciferol, losartan, amLODipine, zolpidem, metFORMIN, omeprazole, atorvastatin, and glipiZIDE.  Meds ordered this encounter  Medications  . cefdinir (OMNICEF) 300 MG capsule    Sig: Take 1 capsule (300 mg total) by mouth 2 (two) times daily.    Dispense:  14 capsule    Refill:  0  . predniSONE (DELTASONE) 10 MG tablet    Sig: 6 tablets on Day 1 , then reduce by 1 tablet daily until gone    Dispense:  21 tablet    Refill:  0  . guaiFENesin-codeine (CHERATUSSIN AC) 100-10 MG/5ML syrup    Sig: Take 5 mLs by mouth 3 (three) times daily as needed for cough.    Dispense:  180 mL    Refill:  0  . DULoxetine (CYMBALTA) 20 MG capsule    Sig: Take 1 capsule (20 mg total) by mouth daily.    Dispense:  30 capsule    Refill:  3    A total of 40 minutes was spent with patient more than half of which was spent in counseling patient on the above mentioned issues , reviewing and explaining recent labs and imaging studies done, and coordination of care.  Medications Discontinued During This Encounter  Medication Reason  . Vitamin D, Ergocalciferol, (DRISDOL) 1.25 MG (50000 UT) CAPS capsule Completed Course  . scopolamine (TRANSDERM-SCOP, 1.5 MG,) 1 MG/3DAYS Error  . ipratropium (ATROVENT) 0.03 % nasal spray Patient has not taken in last 30 days  . fluticasone (FLONASE) 50 MCG/ACT nasal spray Patient has not taken in last 30 days  . celecoxib (CELEBREX) 200 MG capsule Patient has not taken in last 30 days  . HYDROcodone-acetaminophen (NORCO)  10-325 MG tablet   . traMADol (ULTRAM) 50 MG tablet   . etodolac (LODINE) 500 MG tablet   . famotidine (PEPCID) 20 MG tablet     Follow-up: Return in about 3 months (around 01/09/2019).   Crecencio Mc, MD

## 2018-10-11 NOTE — Patient Instructions (Signed)
You have a sinus/ear infection   .  I am prescribing an antibiotic (omnicef  twice daily for 7 days ) and prednisone taper  To manage the infectiin and the inflammation in your ear/sinuses.   Start the prednisone tomorrow morning:  6 tablets all at once on Day 1,  Then taper by 1 tablet daily until gone  I also advise use of the following OTC meds to help with your other symptoms.   Take generic OTC benadryl 25 mg every 8 hours for the drainage,  Sudafed PE  10 mg every 8 hours for the congestion, you may substitute Afrin nasal spray for the nighttime dose of sudafed PE  If needed to prevent insomnia.    Cheratussin with codeine for the cough   I am starting you on Cymbalta for your depression.  20 mg dose take at night after dinner  We can increase the dose in 3-4 weeks if needed.  Let me know  If your joint pain improves on the prednisone,  I will refill the celebrex to use once daily   Your blood sugars will be high while you are on the prednisone.  They will come down once you finish it

## 2018-10-12 LAB — COMPREHENSIVE METABOLIC PANEL
ALT: 12 U/L (ref 0–35)
AST: 12 U/L (ref 0–37)
Albumin: 3.8 g/dL (ref 3.5–5.2)
Alkaline Phosphatase: 115 U/L (ref 39–117)
BUN: 17 mg/dL (ref 6–23)
CO2: 29 mEq/L (ref 19–32)
Calcium: 8.9 mg/dL (ref 8.4–10.5)
Chloride: 106 mEq/L (ref 96–112)
Creatinine, Ser: 0.92 mg/dL (ref 0.40–1.20)
GFR: 61.08 mL/min (ref >60.00)
Glucose, Bld: 72 mg/dL (ref 70–99)
Potassium: 4.2 mEq/L (ref 3.5–5.1)
Sodium: 141 mEq/L (ref 135–145)
Total Bilirubin: 0.6 mg/dL (ref 0.2–1.2)
Total Protein: 6.8 g/dL (ref 6.0–8.3)

## 2018-10-12 LAB — LIPID PANEL
CHOL/HDL RATIO: 3
Cholesterol: 103 mg/dL (ref 0–200)
HDL: 35.4 mg/dL — ABNORMAL LOW (ref >39.00)
LDL Cholesterol: 42 mg/dL (ref 0–99)
NonHDL: 67.68
Triglycerides: 129 mg/dL (ref 0.0–149.0)
VLDL: 25.8 mg/dL (ref 0.0–40.0)

## 2018-10-12 LAB — MICROALBUMIN / CREATININE URINE RATIO
Creatinine,U: 241.6 mg/dL
Microalb Creat Ratio: 1.4 mg/g (ref 0.0–30.0)
Microalb, Ur: 3.5 mg/dL — ABNORMAL HIGH (ref 0.0–1.9)

## 2018-10-12 LAB — HEMOGLOBIN A1C: Hgb A1c MFr Bld: 6.7 % — ABNORMAL HIGH (ref 4.6–6.5)

## 2018-10-14 ENCOUNTER — Encounter: Payer: Self-pay | Admitting: Internal Medicine

## 2018-10-14 DIAGNOSIS — J0100 Acute maxillary sinusitis, unspecified: Secondary | ICD-10-CM | POA: Insufficient documentation

## 2018-10-14 NOTE — Assessment & Plan Note (Signed)
Recurrent,  Aggravated by chronic pain.  Trial of Cymbalta starting at 20 mg daily

## 2018-10-14 NOTE — Assessment & Plan Note (Signed)
Omnicef, prednisone taper,  Avoid decongestants given elevated BP reading

## 2018-10-14 NOTE — Assessment & Plan Note (Signed)
Well controlled on current regimen .  Reminder for annual diabetic eye exam given..  Foot exam done. Meds reviewed and she is on a baby aspirin daily., a statin and an ACE inhibitor for proteinuria   Continue  Glipizide  5 mg in the am and at night. continue metformin 1000 mg bid.   Lab Results  Component Value Date   MICROALBUR 3.5 (H) 10/11/2018   Lab Results  Component Value Date   HGBA1C 6.7 (H) 10/11/2018

## 2018-10-14 NOTE — Assessment & Plan Note (Signed)
She again has no signs of inflammatory arthritis on exam today. She has DJD of several joints.  No longer taking etodolac, or tramadol.  Prednisone taper for sinusitis,  followed by celebrex and  tylenol

## 2018-10-19 ENCOUNTER — Other Ambulatory Visit: Payer: Self-pay | Admitting: Internal Medicine

## 2018-10-27 ENCOUNTER — Other Ambulatory Visit: Payer: Self-pay | Admitting: Internal Medicine

## 2018-11-02 ENCOUNTER — Other Ambulatory Visit: Payer: Self-pay | Admitting: Internal Medicine

## 2018-11-13 ENCOUNTER — Other Ambulatory Visit: Payer: Self-pay | Admitting: Internal Medicine

## 2018-11-14 NOTE — Telephone Encounter (Signed)
Refilled: 05/15/2018 Last OV: 10/11/2018 Next OV: 01/09/2019

## 2018-11-18 ENCOUNTER — Other Ambulatory Visit: Payer: Self-pay | Admitting: Internal Medicine

## 2018-11-24 ENCOUNTER — Other Ambulatory Visit: Payer: Self-pay | Admitting: Internal Medicine

## 2018-12-18 MED ORDER — LOSARTAN POTASSIUM 100 MG PO TABS: 100.0000 mg | ORAL_TABLET | Freq: Every day | ORAL | 0 refills | Status: DC

## 2018-12-31 ENCOUNTER — Other Ambulatory Visit: Payer: Self-pay | Admitting: Internal Medicine

## 2019-01-09 ENCOUNTER — Other Ambulatory Visit: Payer: Self-pay

## 2019-01-09 ENCOUNTER — Ambulatory Visit (INDEPENDENT_AMBULATORY_CARE_PROVIDER_SITE_OTHER): Payer: BC Managed Care – PPO | Admitting: Internal Medicine

## 2019-01-09 ENCOUNTER — Encounter: Payer: Self-pay | Admitting: Internal Medicine

## 2019-01-09 DIAGNOSIS — F320 Major depressive disorder, single episode, mild: Secondary | ICD-10-CM | POA: Diagnosis not present

## 2019-01-09 DIAGNOSIS — Z7189 Other specified counseling: Secondary | ICD-10-CM | POA: Insufficient documentation

## 2019-01-09 DIAGNOSIS — M2550 Pain in unspecified joint: Secondary | ICD-10-CM | POA: Diagnosis not present

## 2019-01-09 MED ORDER — DULOXETINE HCL 40 MG PO CPEP: 40.0000 mg | ORAL_CAPSULE | Freq: Every day | ORAL | 1 refills | Status: DC

## 2019-01-09 MED ORDER — CELECOXIB 200 MG PO CAPS: 200.0000 mg | ORAL_CAPSULE | Freq: Every day | ORAL | 2 refills | Status: DC

## 2019-01-09 NOTE — Patient Instructions (Addendum)
For your allergies ,  You should also consider one of these newer second generation antihistamines that are longer acting, non sedating and  available OTC:  Generic  Zyrtec, which is cetirizine.    generic Allegra , available generically as fexofenadine ; comes in 60 mg and 180 mg once daily strengths.     I will increase your Cymbalta dose to 40 mg to work on your depression. You can double up using the 20 mg capsules you have  I sent in  generic celebrex 200 mg daily .[name]  This is your anti inflammatory (replaces Etodolac)  .    You can add up to 2000 mg of acetominophen (tylenol) every day safely  In divided doses (500 mg every 6 hours  Or 1000 mg every 12 hours.)  [facility] is selling the black face masks by "[employer]" (the company I mentioned) that are protective for 30 washes   Please return in August.  We will repeat labs then

## 2019-01-09 NOTE — Assessment & Plan Note (Signed)
She again has no signs of inflammatory arthritis on exam today. She has DJD of several joints.  No longer taking etodolac, or tramadol.  Prescribing (again)  celebrex and  tylenol

## 2019-01-09 NOTE — Assessment & Plan Note (Signed)
Educated patient on the signs and symptoms of COVID-19 infection and ways to avoid the viral infection including washing hands frequently with soap and water,  using hand sanitizer if unable to wash, avoiding touching face,  staying at home and limiting visitors,  and avoiding contact with people coming in and out of home.  Reminded patient to call office with questions/concerns.  The importance of social distancing and use of masks was discussed today

## 2019-01-09 NOTE — Progress Notes (Signed)
Virtual Visit via [email or web address]  This visit type was conducted due to national recommendations for restrictions regarding the COVID-19 pandemic (e.g. social distancing).  This format is felt to be most appropriate for this patient at this time.  All issues noted in this document were discussed and addressed.  No physical exam was performed (except for noted visual exam findings with Video Visits).   I connected with@ on 01/09/19 at  2:30 PM EDT by a video enabled telemedicine application or telephone and verified that I am speaking with the correct person using two identifiers. Location patient: home Location provider: work or home office Persons participating in the virtual visit: patient, provider  I discussed the limitations, risks, security and privacy concerns of performing an evaluation and management service by telephone and the availability of in person appointments. I also discussed with the patient that there may be a patient responsible charge related to this service. The patient expressed understanding and agreed to proceed.   Reason for visit: FOLLOW up on chronic pain and depression   HPI:  The patient has no signs or symptoms of COVID 19 infection (fever, cough, sore throat  or shortness of breath beyond what is typical for patient).  Patient denies contact with other persons with the above mentioned symptoms or with anyone confirmed to have COVID 19.  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 She is washing her hands frequently   Treated Feb 5 for acute sinusitis to resolution.  Prescribed duloxetine 20 mg for major depressive episode complicated by chronic pain.  She is tolerating the medication  But continues to feel despondent and tearful several days per week .  She did not receive the celebrex for unclear reasons.     ROS: See pertinent positives and negatives per HPI.  Past Medical History:  Diagnosis Date   . Allergic rhinitis   . Complication of anesthesia 1981   While pt was under anesthesia, pt had an asthema attack during BTL.  . Depression, major, single episode, mild ([place]) 09/04/2016  . Elbow tendonitis    a. bilat, s/p surgery.  . Essential hypertension   . Functional ovarian cysts 2011   a. s/p TAH (1994);  b. s/p oopherectomy (~2011)  . Gallstones   . GERD (gastroesophageal reflux disease)   . Kidney stones 2011   a. s/p lithotripsy  . Low grade fever   . Obesity   . Tinnitus   . Type 2 diabetes mellitus ([place])    a.  Hemoglobin A1c in July 2016, 6.7.    Past Surgical History:  Procedure Laterality Date  . ABDOMINAL HYSTERECTOMY  1994  . COLONOSCOPY  September 2008   Non-bleeding internal hemorhoids identified.  . ELBOW SURGERY    . NASAL SINUS SURGERY  09/18/2015   Procedure: IMAGE GUIDED SINUS SURGERY, BILATERAL MAXILLARY BALLOON SINUPLASTY, BILATERAL FRONTAL BALLOON SINUPLASTY, RIGHT SPHENOID  SINUPLASTY, RIGHT CONCHABULLOSA RESECTION;  Surgeon: Carloyn Manner, MD;  Location: [facility] OR[facility];  Service: ENT;;  . 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 OPEN REDUCTION INTERNAL FIXATION (ORIF) DISTAL RADIAL FRACTURE Right 02/07/2017   Procedure: OPEN REDUCTION INTERNAL FIXATION (ORIF) DISTAL RADIAL FRACTURE;  Surgeon: Hessie Knows, MD;  Location: [facility] OR[facility];  Service: Orthopedics;  Laterality: Right;  . PARTIAL HYSTERECTOMY  1994   still has ovaries, removed for fibroids  . TONSILLECTOMY N/A 09/18/2015   Procedure: TONSILLECTOMY;  Surgeon: Carloyn Manner,  MD;  Location: [facility] ORS;  Service: ENT;  Laterality: N/A;  . TUBAL LIGATION    . UPPER GI ENDOSCOPY  2011    Family History  Problem Relation Age of Onset  . Cancer Mother        colon  . Tuberculosis Father   . Diabetes Father   . Heart disease Father        MI x 2  . Heart attack Father   . Diabetes Sister        half sister    SOCIAL HX: working full time    Current Outpatient  Medications:  .  amLODipine (NORVASC) 5 MG tablet, TAKE 1 TABLET (5 MG TOTAL) BY MOUTH DAILY., Disp: 90 tablet, Rfl: 1 .  atorvastatin (LIPITOR) 20 MG tablet, TAKE 1 TABLET BY MOUTH EVERY DAY, Disp: 90 tablet, Rfl: 1 .  cholecalciferol (VITAMIN D) 1000 UNITS tablet, Take 1,000 Units by mouth daily. , Disp: , Rfl:  .  DULoxetine 40 MG CPEP, Take 40 mg by mouth daily., Disp: 90 capsule, Rfl: 1 .  glipiZIDE (GLUCOTROL) 5 MG tablet, TAKE 1 TABLET (5 MG TOTAL) BY MOUTH 2 (TWO) TIMES DAILY BEFORE A MEAL., Disp: 180 tablet, Rfl: 1 .  losartan (COZAAR) 100 MG tablet, Take 1 tablet (100 mg total) by mouth daily., Disp: 90 tablet, Rfl: 0 .  metFORMIN (GLUCOPHAGE) 1000 MG tablet, TAKE 1 TABLET BY MOUTH 2 TIMES DAILY WITH A MEAL., Disp: 180 tablet, Rfl: 1 .  omeprazole (PRILOSEC) 40 MG capsule, TAKE 1 CAPSULE BY MOUTH EVERY DAY, Disp: 90 capsule, Rfl: 1 .  zolpidem (AMBIEN) 5 MG tablet, TAKE 1 TABLET BY MOUTH EVERY DAY AT BEDTIME AS NEEDED, Disp: 30 tablet, Rfl: 5 .  celecoxib (CELEBREX) 200 MG capsule, Take 1 capsule (200 mg total) by mouth daily., Disp: 30 capsule, Rfl: 2  EXAM:  VITALS per patient if applicable:  GENERAL: alert, oriented, appears well and in no acute distress  HEENT: atraumatic, conjunttiva clear, no obvious abnormalities on inspection of external nose and ears  NECK: normal movements of the head and neck  LUNGS: on inspection no signs of respiratory distress, breathing rate appears normal, no obvious gross SOB, gasping or wheezing  CV: no obvious cyanosis  MS: moves all visible extremities without noticeable abnormality  PSYCH/NEURO: pleasant and cooperative, no obvious depression or anxiety, speech and thought processing grossly intact  ASSESSMENT AND PLAN:  Discussed the following assessment and plan:  Depression, major, single episode, mild (HCC)  Polyarthralgia  Educated About Covid-19 Virus Infection  Depression, major, single episode, mild (HCC) TOLELRATING  DULOXETINE at 20 mg dose but symptoms are persistent increasing dose to 40 mg daily   Polyarthralgia  She again has no signs of inflammatory arthritis on exam today. She has DJD of several joints.  No longer taking etodolac, or tramadol.  Prescribing (again)  celebrex and  tylenol   Educated About Covid-19 Virus Infection Educated patient on the signs and symptoms of COVID-19 infection and ways to avoid the viral infection including washing hands frequently with soap and water,  using hand sanitizer if unable to wash, avoiding touching face,  staying at home and limiting visitors,  and avoiding contact with people coming in and out of home.  Reminded patient to call office with questions/concerns.  The importance of social distancing and use of masks was discussed today    I discussed the assessment and treatment plan with the patient. The patient was provided an opportunity to ask  questions and all were answered. The patient agreed with the plan and demonstrated an understanding of the instructions.   The patient was advised to call back or seek an in-person evaluation if the symptoms worsen or if the condition fails to improve as anticipated.  I provided 25 minutes of non-face-to-face time during this encounter.   Crecencio Mc, MD

## 2019-01-09 NOTE — Assessment & Plan Note (Signed)
TOLELRATING DULOXETINE at 20 mg dose but symptoms are persistent increasing dose to 40 mg daily

## 2019-03-13 ENCOUNTER — Other Ambulatory Visit: Payer: Self-pay | Admitting: Internal Medicine

## 2019-03-13 NOTE — Progress Notes (Unsigned)
pl

## 2019-03-13 NOTE — Telephone Encounter (Signed)
Please clarify why patient 's pharmacy is requesting telmisartan  Alternative when she is taking losartan .  If they can refill the losartan,  They should

## 2019-03-15 NOTE — Telephone Encounter (Signed)
Spoke with pt and she stated that she has been taking losartan but it is on back order and the pharmacy is requesting something to be sent in in place of the losartan.

## 2019-03-18 MED ORDER — TELMISARTAN 80 MG PO TABS: 80.0000 mg | ORAL_TABLET | Freq: Every day | ORAL | 1 refills | Status: DC

## 2019-03-18 NOTE — Addendum Note (Signed)
Addended by: Crecencio Mc on: 03/18/2019 10:17 PM   Modules accepted: Orders

## 2019-03-19 ENCOUNTER — Other Ambulatory Visit: Payer: Self-pay | Admitting: Internal Medicine

## 2019-03-19 NOTE — Telephone Encounter (Signed)
Losartan is on backorder and the pharmacy is requesting that an alternative be sent in.

## 2019-03-22 NOTE — Telephone Encounter (Signed)
Is patient taking irbesartan?  This is too similar to telmisartan and losartan and is listed in her chart as active . If she is taking irbesartan,  She should not take telmisartan or losartan

## 2019-03-23 ENCOUNTER — Telehealth: Payer: Self-pay

## 2019-03-23 NOTE — Telephone Encounter (Signed)
Updated med list

## 2019-03-23 NOTE — Telephone Encounter (Signed)
It wouldn't let me either ! ( "wth is a "future encounter ?")

## 2019-03-23 NOTE — Telephone Encounter (Signed)
Ok, so continue telmisartan

## 2019-03-23 NOTE — Telephone Encounter (Addendum)
Patient is taking only Telmisartan. Per patient she is not taking Losartan or irbesartan.  It will not let me remove the irbesartan.

## 2019-03-23 NOTE — Telephone Encounter (Signed)
LMTCB

## 2019-03-30 ENCOUNTER — Other Ambulatory Visit: Payer: Self-pay | Admitting: Internal Medicine

## 2019-03-30 DIAGNOSIS — E1169 Type 2 diabetes mellitus with other specified complication: Secondary | ICD-10-CM

## 2019-03-30 DIAGNOSIS — E785 Hyperlipidemia, unspecified: Secondary | ICD-10-CM

## 2019-04-11 ENCOUNTER — Encounter: Payer: Self-pay | Admitting: Internal Medicine

## 2019-04-11 ENCOUNTER — Other Ambulatory Visit: Payer: Self-pay

## 2019-04-11 ENCOUNTER — Ambulatory Visit (INDEPENDENT_AMBULATORY_CARE_PROVIDER_SITE_OTHER): Payer: BC Managed Care – PPO | Admitting: Internal Medicine

## 2019-04-11 VITALS — Ht 65.0 in | Wt 239.0 lb

## 2019-04-11 DIAGNOSIS — R0982 Postnasal drip: Secondary | ICD-10-CM | POA: Diagnosis not present

## 2019-04-11 DIAGNOSIS — F320 Major depressive disorder, single episode, mild: Secondary | ICD-10-CM

## 2019-04-11 DIAGNOSIS — E1121 Type 2 diabetes mellitus with diabetic nephropathy: Secondary | ICD-10-CM | POA: Diagnosis not present

## 2019-04-11 DIAGNOSIS — D509 Iron deficiency anemia, unspecified: Secondary | ICD-10-CM

## 2019-04-11 DIAGNOSIS — Z1239 Encounter for other screening for malignant neoplasm of breast: Secondary | ICD-10-CM

## 2019-04-11 MED ORDER — IPRATROPIUM BROMIDE 0.06 % NA SOLN: 2.0000 | Freq: Four times a day (QID) | NASAL | 12 refills | Status: DC

## 2019-04-11 MED ORDER — TRAMADOL HCL 50 MG PO TABS: 50.0000 mg | ORAL_TABLET | Freq: Four times a day (QID) | ORAL | 0 refills | Status: DC | PRN

## 2019-04-11 NOTE — Progress Notes (Signed)
Virtual Visit via [email or web address]  This visit type was conducted due to national recommendations for restrictions regarding the COVID-19 pandemic (e.g. social distancing).  This format is felt to be most appropriate for this patient at this time.  All issues noted in this document were discussed and addressed.  No physical exam was performed (except for noted visual exam findings with Video Visits).   I connected with@ on 04/11/19 at  9:30 AM EDT by a video enabled telemedicine application or telephone and verified that I am speaking with the correct person using two identifiers. Location patient: home Location provider: work or home office Persons participating in the virtual visit: patient, provider  I discussed the limitations, risks, security and privacy concerns of performing an evaluation and management service by telephone and the availability of in person appointments. I also discussed with the patient that there may be a patient responsible charge related to this service. The patient expressed understanding and agreed to proceed.   Reason for visit: 3 month follow up on multiple issues including Type 2 DM,  MDD,  Hyperlipidemia   HPI:   68 yr old female treated for MDD  3 months ago with cymbalta, chosen due to concurrent chronic pain, here for follow up  She reports an improved mood in general.  Has a more positive outlook,  Less irritable  .     3 month follow up on diabetes.  Patient has no complaints today.  Patient is following a low glycemic index diet and taking all prescribed medications regularly without side effects.  Fasting sugars have been under less than 140 most of the time and post prandials have been under 160 except on rare occasions. Patient is exercising about 3 times per week and intentionally trying to lose weight .  Patient has had an eye exam in the last 12 months and checks feet regularly for signs of infection.  Patient does not walk barefoot outside,  And denies an  numbness tingling or burning in feet. Patient is up to date on all recommended vaccinations  Treated for sinusitis July 13,  symptoms resolved except for post nasal drip.  Using Flonase BID ,  Allergic symptoms all improved.   Treated for Achilles tendonitis on the left , using a boot and tramadol prn   Needs nonfasting labs needed   ROS: See pertinent positives and negatives per HPI.  Past Medical History:  Diagnosis Date  . Allergic rhinitis   . Complication of anesthesia 1981   While pt was under anesthesia, pt had an asthema attack during BTL.  . Depression, major, single episode, mild ([place]) 09/04/2016  . Elbow tendonitis    a. bilat, s/p surgery.  . Essential hypertension   . Functional ovarian cysts 2011   a. s/p TAH (1994);  b. s/p oopherectomy (~2011)  . Gallstones   . GERD (gastroesophageal reflux disease)   . Kidney stones 2011   a. s/p lithotripsy  . Low grade fever   . Obesity   . Tinnitus   . Type 2 diabetes mellitus ([place])    a.  Hemoglobin A1c in July 2016, 6.7.    Past Surgical History:  Procedure Laterality Date  . ABDOMINAL HYSTERECTOMY  1994  . COLONOSCOPY  September 2008   Non-bleeding internal hemorhoids identified.  . ELBOW SURGERY    . NASAL SINUS SURGERY  09/18/2015   Procedure: IMAGE GUIDED SINUS SURGERY, BILATERAL MAXILLARY BALLOON SINUPLASTY, BILATERAL FRONTAL BALLOON SINUPLASTY, RIGHT SPHENOID  SINUPLASTY, RIGHT CONCHABULLOSA RESECTION;  Surgeon: Carloyn Manner, MD;  Location: [facility];  Service: ENT;;  . Denice Bors   bilateral, 7 cm benign tumor left ovary  . OPEN REDUCTION INTERNAL FIXATION (ORIF) DISTAL RADIAL FRACTURE Right 02/07/2017   Procedure: OPEN REDUCTION INTERNAL FIXATION (ORIF) DISTAL RADIAL FRACTURE;  Surgeon: Hessie Knows, MD;  Location: [facility] OR[facility];  Service: Orthopedics;  Laterality: Right;  . PARTIAL HYSTERECTOMY  1994   still has ovaries, removed for fibroids  . TONSILLECTOMY N/A 09/18/2015   Procedure: TONSILLECTOMY;   Surgeon: Carloyn Manner, MD;  Location: [facility] OR[facility];  Service: ENT;  Laterality: N/A;  . TUBAL LIGATION    . UPPER GI ENDOSCOPY  2011    Family History  Problem Relation Age of Onset  . Cancer Mother        colon  . Tuberculosis Father   . Diabetes Father   . Heart disease Father        MI x 2  . Heart attack Father   . Diabetes Sister        half sister    SOCIAL HX:  reports that she quit smoking about 34 years ago. Her smoking use included cigarettes. She has a 15.00 pack-year smoking history. She has never used smokeless tobacco. She reports current alcohol use. She reports that she does not use drugs.   Current Outpatient Medications:  .  amLODipine (NORVASC) 5 MG tablet, TAKE 1 TABLET (5 MG TOTAL) BY MOUTH DAILY., Disp: 90 tablet, Rfl: 1 .  atorvastatin (LIPITOR) 20 MG tablet, TAKE 1 TABLET BY MOUTH EVERY DAY, Disp: 90 tablet, Rfl: 1 .  celecoxib (CELEBREX) 200 MG capsule, TAKE 1 CAPSULE BY MOUTH EVERY DAY, Disp: 30 capsule, Rfl: 2 .  cholecalciferol (VITAMIN D) 1000 UNITS tablet, Take 1,000 Units by mouth daily. , Disp: , Rfl:  .  DULoxetine 40 MG CPEP, Take 40 mg by mouth daily., Disp: 90 capsule, Rfl: 1 .  glipiZIDE (GLUCOTROL) 5 MG tablet, TAKE 1 TABLET (5 MG TOTAL) BY MOUTH 2 (TWO) TIMES DAILY BEFORE A MEAL., Disp: 180 tablet, Rfl: 1 .  metFORMIN (GLUCOPHAGE) 1000 MG tablet, TAKE 1 TABLET BY MOUTH 2 TIMES DAILY WITH A MEAL., Disp: 180 tablet, Rfl: 1 .  omeprazole (PRILOSEC) 40 MG capsule, TAKE 1 CAPSULE BY MOUTH EVERY DAY, Disp: 90 capsule, Rfl: 1 .  telmisartan (MICARDIS) 80 MG tablet, Take 1 tablet (80 mg total) by mouth daily., Disp: 90 tablet, Rfl: 1 .  zolpidem (AMBIEN) 5 MG tablet, TAKE 1 TABLET BY MOUTH EVERY DAY AT BEDTIME AS NEEDED, Disp: 30 tablet, Rfl: 5 .  ipratropium (ATROVENT) 0.06 % nasal spray, Place 2 sprays into both nostrils 4 (four) times daily., Disp: 15 mL, Rfl: 12 .  traMADol (ULTRAM) 50 MG tablet, Take 1 tablet (50 mg total) by mouth every 6 (six)  hours as needed for up to 7 days., Disp: 28 tablet, Rfl: 0  EXAM:  VITALS per patient if applicable:  GENERAL: alert, oriented, appears well and in no acute distress  HEENT: atraumatic, conjunttiva clear, no obvious abnormalities on inspection of external nose and ears  NECK: normal movements of the head and neck  LUNGS: on inspection no signs of respiratory distress, breathing rate appears normal, no obvious gross SOB, gasping or wheezing  CV: no obvious cyanosis  MS: moves all visible extremities without noticeable abnormality  PSYCH/NEURO: pleasant and cooperative, no obvious depression or anxiety, speech and thought processing grossly intact  ASSESSMENT AND PLAN:  Discussed the following assessment and  plan:  Type 2 diabetes mellitus with nephropathy ([place]) Well controlled on current regimen .  Reminder for annual diabetic eye exam given..  . Meds reviewed and she is on a baby aspirin daily., a statin and an ACE inhibitor for proteinuria   Continue  Glipizide  5 mg in the am and at night. continue metformin 1000 mg bid. Repeat labs due   Lab Results  Component Value Date   MICROALBUR 3.5 (H) 10/11/2018   Lab Results  Component Value Date   HGBA1C 6.7 (H) 10/11/2018     Post-nasal drip Persistent despite use of flonase.  Adding atrovent nasal spray   Depression, major, single episode, mild (HCC) Improved with cymbalta.  No changes today   Iron deficiency anemia Patient was referred for EGD/colonoscopy in October 2019 to Dr Bary Castilla but refused the service.  Repeating labs     I discussed the assessment and treatment plan with the patient. The patient was provided an opportunity to ask questions and all were answered. The patient agreed with the plan and demonstrated an understanding of the instructions.   The patient was advised to call back or seek an in-person evaluation if the symptoms worsen or if the condition fails to improve as anticipated.  I provided 25  minutes of non-face-to-face time during this encounter.   Crecencio Mc, MD

## 2019-04-11 NOTE — Patient Instructions (Signed)
Your annual mammogram has been ordered at the [facility] Location .  You are encouraged (required) to call to make your appointment at [facility]  I am prescribing Atrovent nasal spray for the constant dripping  I am prescribing  tramadol to use for severe pain not relieved by celebrex/tylenol

## 2019-04-12 DIAGNOSIS — R0982 Postnasal drip: Secondary | ICD-10-CM | POA: Insufficient documentation

## 2019-04-12 NOTE — Assessment & Plan Note (Addendum)
Patient was referred for EGD/colonoscopy in October 2019 to Dr Bary Castilla but refused the service.  Repeating labs

## 2019-04-12 NOTE — Assessment & Plan Note (Signed)
Persistent despite use of flonase.  Adding atrovent nasal spray

## 2019-04-12 NOTE — Assessment & Plan Note (Signed)
Improved with cymbalta.  No changes today

## 2019-04-12 NOTE — Assessment & Plan Note (Signed)
Well controlled on current regimen .  Reminder for annual diabetic eye exam given..  . Meds reviewed and she is on a baby aspirin daily., a statin and an ACE inhibitor for proteinuria   Continue  Glipizide  5 mg in the am and at night. continue metformin 1000 mg bid. Repeat labs due   Lab Results  Component Value Date   MICROALBUR 3.5 (H) 10/11/2018   Lab Results  Component Value Date   HGBA1C 6.7 (H) 10/11/2018

## 2019-04-18 ENCOUNTER — Telehealth: Payer: Self-pay

## 2019-04-18 NOTE — Telephone Encounter (Signed)
LMTCB. Need to schedule pt for a nonfasting lab appt.  

## 2019-04-23 NOTE — Telephone Encounter (Signed)
Lab appt has been scheduled for next week. Pt is aware of appt date and time.

## 2019-04-23 NOTE — Telephone Encounter (Signed)
Spoke with pt and scheduled her for a non fasting lab appt. Pt is aware of appt date and time.

## 2019-04-24 ENCOUNTER — Other Ambulatory Visit: Payer: Self-pay | Admitting: Internal Medicine

## 2019-05-01 ENCOUNTER — Other Ambulatory Visit (INDEPENDENT_AMBULATORY_CARE_PROVIDER_SITE_OTHER): Payer: BC Managed Care – PPO

## 2019-05-01 ENCOUNTER — Other Ambulatory Visit: Payer: Self-pay

## 2019-05-01 DIAGNOSIS — E1121 Type 2 diabetes mellitus with diabetic nephropathy: Secondary | ICD-10-CM | POA: Diagnosis not present

## 2019-05-01 DIAGNOSIS — D509 Iron deficiency anemia, unspecified: Secondary | ICD-10-CM

## 2019-05-01 LAB — COMPREHENSIVE METABOLIC PANEL
ALT: 15 U/L (ref 0–35)
AST: 13 U/L (ref 0–37)
Albumin: 4.2 g/dL (ref 3.5–5.2)
Alkaline Phosphatase: 91 U/L (ref 39–117)
BUN: 17 mg/dL (ref 6–23)
CO2: 27 mEq/L (ref 19–32)
Calcium: 8.9 mg/dL (ref 8.4–10.5)
Chloride: 104 mEq/L (ref 96–112)
Creatinine, Ser: 0.85 mg/dL (ref 0.40–1.20)
GFR: 66.81 mL/min (ref >60.00)
Glucose, Bld: 121 mg/dL — ABNORMAL HIGH (ref 70–99)
Potassium: 4 mEq/L (ref 3.5–5.1)
Sodium: 139 mEq/L (ref 135–145)
Total Bilirubin: 0.4 mg/dL (ref 0.2–1.2)
Total Protein: 6.3 g/dL (ref 6.0–8.3)

## 2019-05-01 LAB — HEMOGLOBIN A1C: Hgb A1c MFr Bld: 7.3 % — ABNORMAL HIGH (ref 4.6–6.5)

## 2019-05-01 LAB — CBC WITH DIFFERENTIAL/PLATELET
Basophils Absolute: 0 10*3/uL (ref 0.0–0.1)
Basophils Relative: 0.8 % (ref 0.0–3.0)
Eosinophils Absolute: 0.3 10*3/uL (ref 0.0–0.7)
Eosinophils Relative: 5.9 % — ABNORMAL HIGH (ref 0.0–5.0)
HCT: 34.4 % — ABNORMAL LOW (ref 36.0–46.0)
Hemoglobin: 11.1 g/dL — ABNORMAL LOW (ref 12.0–15.0)
Lymphocytes Relative: 37.6 % (ref 12.0–46.0)
Lymphs Abs: 2.2 10*3/uL (ref 0.7–4.0)
MCHC: 32.1 g/dL (ref 30.0–36.0)
MCV: 82.2 fl (ref 78.0–100.0)
Monocytes Absolute: 0.4 10*3/uL (ref 0.1–1.0)
Monocytes Relative: 7.1 % (ref 3.0–12.0)
Neutro Abs: 2.8 10*3/uL (ref 1.4–7.7)
Neutrophils Relative %: 48.6 % (ref 43.0–77.0)
Platelets: 284 10*3/uL (ref 150.0–400.0)
RBC: 4.19 Mil/uL (ref 3.87–5.11)
RDW: 15.4 % (ref 11.5–15.5)
WBC: 5.8 10*3/uL (ref 4.0–10.5)

## 2019-05-02 LAB — IRON,TIBC AND FERRITIN PANEL
%SAT: 10 % (calc) — ABNORMAL LOW (ref 16–45)
Ferritin: 12 ng/mL — ABNORMAL LOW (ref 16–288)
Iron: 42 ug/dL — ABNORMAL LOW (ref 45–160)
TIBC: 420 mcg/dL (calc) (ref 250–450)

## 2019-05-06 ENCOUNTER — Other Ambulatory Visit: Payer: Self-pay | Admitting: Internal Medicine

## 2019-05-07 NOTE — Telephone Encounter (Signed)
Refilled: 11/14/2018 Last OV: 04/11/2019 Next OV: 07/18/2019

## 2019-06-16 ENCOUNTER — Other Ambulatory Visit: Payer: Self-pay | Admitting: Internal Medicine

## 2019-06-16 MED ORDER — HYDROCHLOROTHIAZIDE 25 MG PO TABS: 25.0000 mg | ORAL_TABLET | Freq: Every day | ORAL | 3 refills | Status: DC

## 2019-06-16 MED ORDER — AMLODIPINE BESYLATE 10 MG PO TABS: 10.0000 mg | ORAL_TABLET | Freq: Every day | ORAL | 1 refills | Status: DC

## 2019-06-22 ENCOUNTER — Other Ambulatory Visit: Payer: Self-pay | Admitting: Internal Medicine

## 2019-06-25 ENCOUNTER — Other Ambulatory Visit: Payer: Self-pay | Admitting: Internal Medicine

## 2019-06-30 ENCOUNTER — Other Ambulatory Visit: Payer: Self-pay | Admitting: Internal Medicine

## 2019-07-04 ENCOUNTER — Ambulatory Visit (INDEPENDENT_AMBULATORY_CARE_PROVIDER_SITE_OTHER): Payer: BC Managed Care – PPO | Admitting: Internal Medicine

## 2019-07-04 ENCOUNTER — Encounter: Payer: Self-pay | Admitting: Internal Medicine

## 2019-07-04 ENCOUNTER — Other Ambulatory Visit: Payer: Self-pay

## 2019-07-04 DIAGNOSIS — E1121 Type 2 diabetes mellitus with diabetic nephropathy: Secondary | ICD-10-CM

## 2019-07-04 NOTE — Assessment & Plan Note (Signed)
Recent elevations in fasting sugars not due to dietary indiscretions,  Recent use of prednisone or medication lapse.  Will have her check 2 hr PP dinner sugars for 7 days and submit data via mychart .  Discussed options of eithOzempic/Trulicity

## 2019-07-04 NOTE — Progress Notes (Signed)
Virtual Visit via [email or web address]  This visit type was conducted due to national recommendations for restrictions regarding the COVID-19 pandemic (e.g. social distancing).  This format is felt to be most appropriate for this patient at this time.  All issues noted in this document were discussed and addressed.  No physical exam was performed (except for noted visual exam findings with Video Visits).   I connected with@ on 07/04/19 at  8:30 AM EDT by a video enabled telemedicine application and verified that I am speaking with the correct person using two identifiers. Location patient: home Location provider: work or home office Persons participating in the virtual visit: patient, provider  I discussed the limitations, risks, security and privacy concerns of performing an evaluation and management service by telephone and the availability of in person appointments. I also discussed with the patient that there may be a patient responsible charge related to this service. The patient expressed understanding and agreed to proceed.  Reason for visit: elevated blood sugars   HPI:   68 yr old female with T2DM with nephropathy presents for management of elevated blood sugars.  She has been following a low GI diet and  Taking metformin 1000 mg bid and  Glipizide 5 mg bid before meals.  Morning fasting cbgs for the past week have been 190 to 240.  Has not checked any evening BS.  Lab Results  Component Value Date   HGBA1C 7.3 (H) 05/01/2019      ROS: See pertinent positives and negatives per HPI.  Past Medical History:  Diagnosis Date  . Allergic rhinitis   . Complication of anesthesia 1981   While pt was under anesthesia, pt had an asthema attack during BTL.  . Depression, major, single episode, mild ([place]) 09/04/2016  . Elbow tendonitis    a. bilat, s/p surgery.  . Essential hypertension   . Functional ovarian cysts 2011   a. s/p TAH (1994);  b. s/p oopherectomy (~2011)  . Gallstones   . GERD  (gastroesophageal reflux disease)   . Kidney stones 2011   a. s/p lithotripsy  . Low grade fever   . Obesity   . Tinnitus   . Type 2 diabetes mellitus ([place])    a.  Hemoglobin A1c in July 2016, 6.7.    Past Surgical History:  Procedure Laterality Date  . ABDOMINAL HYSTERECTOMY  1994  . COLONOSCOPY  September 2008   Non-bleeding internal hemorhoids identified.  . ELBOW SURGERY    . NASAL SINUS SURGERY  09/18/2015   Procedure: IMAGE GUIDED SINUS SURGERY, BILATERAL MAXILLARY BALLOON SINUPLASTY, BILATERAL FRONTAL BALLOON SINUPLASTY, RIGHT SPHENOID  SINUPLASTY, RIGHT CONCHABULLOSA RESECTION;  Surgeon: Carloyn Manner, MD;  Location: [facility] ORS;  Service: ENT;;  . 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 OPEN REDUCTION INTERNAL FIXATION (ORIF) DISTAL RADIAL FRACTURE Right 02/07/2017   Procedure: OPEN REDUCTION INTERNAL FIXATION (ORIF) DISTAL RADIAL FRACTURE;  Surgeon: Hessie Knows, MD;  Location: [facility] ORS;  Service: Orthopedics;  Laterality: Right;  . PARTIAL HYSTERECTOMY  1994   still has ovaries, removed for fibroids  . TONSILLECTOMY N/A 09/18/2015   Procedure: TONSILLECTOMY;  Surgeon: Carloyn Manner, MD;  Location: [facility] ORS;  Service: ENT;  Laterality: N/A;  . TUBAL LIGATION    . UPPER GI ENDOSCOPY  2011    Family History  Problem Relation Age of Onset  . Cancer Mother        colon  . Tuberculosis Father   . Diabetes Father   .  Heart disease Father        MI x 2  . Heart attack Father   . Diabetes Sister        half sister    SOCIAL HX:  reports that she quit smoking about 34 years ago. Her smoking use included cigarettes. She has a 15.00 pack-year smoking history. She has never used smokeless tobacco. She reports current alcohol use. She reports that she does not use drugs.   Current Outpatient Medications:  .  amLODipine (NORVASC) 10 MG tablet, Take 1 tablet (10 mg total) by mouth at bedtime., Disp: 90 tablet, Rfl: 1 .  atorvastatin (LIPITOR) 20 MG  tablet, TAKE 1 TABLET BY MOUTH EVERY DAY, Disp: 90 tablet, Rfl: 1 .  celecoxib (CELEBREX) 200 MG capsule, TAKE 1 CAPSULE BY MOUTH EVERY DAY, Disp: 30 capsule, Rfl: 2 .  cholecalciferol (VITAMIN D) 1000 UNITS tablet, Take 1,000 Units by mouth daily. , Disp: , Rfl:  .  DULoxetine HCl 40 MG CPEP, TAKE 1 CAPSULE BY MOUTH EVERY DAY, Disp: 90 capsule, Rfl: 1 .  glipiZIDE (GLUCOTROL) 5 MG tablet, TAKE 1 TABLET (5 MG TOTAL) BY MOUTH 2 (TWO) TIMES DAILY BEFORE A MEAL., Disp: 180 tablet, Rfl: 1 .  hydrochlorothiazide (HYDRODIURIL) 25 MG tablet, Take 1 tablet (25 mg total) by mouth daily., Disp: 90 tablet, Rfl: 3 .  ipratropium (ATROVENT) 0.06 % nasal spray, Place 2 sprays into both nostrils 4 (four) times daily., Disp: 15 mL, Rfl: 12 .  metFORMIN (GLUCOPHAGE) 1000 MG tablet, TAKE 1 TABLET BY MOUTH 2 TIMES DAILY WITH A MEAL., Disp: 180 tablet, Rfl: 1 .  omeprazole (PRILOSEC) 40 MG capsule, TAKE 1 CAPSULE BY MOUTH EVERY DAY, Disp: 90 capsule, Rfl: 3 .  telmisartan (MICARDIS) 80 MG tablet, Take 1 tablet (80 mg total) by mouth daily., Disp: 90 tablet, Rfl: 1 .  zolpidem (AMBIEN) 5 MG tablet, TAKE 1 TABLET BY MOUTH EVERY DAY AT BEDTIME AS NEEDED, Disp: 30 tablet, Rfl: 5  EXAM:  VITALS per patient if applicable:  GENERAL: alert, oriented, appears well and in no acute distress  HEENT: atraumatic, conjunttiva clear, no obvious abnormalities on inspection of external nose and ears  NECK: normal movements of the head and neck  LUNGS: on inspection no signs of respiratory distress, breathing rate appears normal, no obvious gross SOB, gasping or wheezing  CV: no obvious cyanosis  MS: moves all visible extremities without noticeable abnormality  PSYCH/NEURO: pleasant and cooperative, no obvious depression or anxiety, speech and thought processing grossly intact  ASSESSMENT AND PLAN:  Discussed the following assessment and plan:  Type 2 diabetes mellitus with nephropathy (HCC)  Type 2 diabetes mellitus  with nephropathy (HCC) Recent elevations in fasting sugars not due to dietary indiscretions,  Recent use of prednisone or medication lapse.  Will have her check 2 hr PP dinner sugars for 7 days and submit data via mychart .  Discussed options of eithOzempic/Trulicity     I discussed the assessment and treatment plan with the patient. The patient was provided an opportunity to ask questions and all were answered. The patient agreed with the plan and demonstrated an understanding of the instructions.   The patient was advised to call back or seek an in-person evaluation if the symptoms worsen or if the condition fails to improve as anticipated.  I provided 15 minutes of non-face-to-face time during this encounter.   Crecencio Mc, MD

## 2019-07-17 ENCOUNTER — Other Ambulatory Visit: Payer: Self-pay

## 2019-07-18 ENCOUNTER — Ambulatory Visit: Payer: BC Managed Care – PPO | Admitting: Internal Medicine

## 2019-07-18 VITALS — BP 140/80 | HR 99 | Temp 96.0°F | Resp 16 | Wt 227.0 lb

## 2019-07-18 DIAGNOSIS — Z1211 Encounter for screening for malignant neoplasm of colon: Secondary | ICD-10-CM

## 2019-07-18 DIAGNOSIS — F320 Major depressive disorder, single episode, mild: Secondary | ICD-10-CM

## 2019-07-18 DIAGNOSIS — E1121 Type 2 diabetes mellitus with diabetic nephropathy: Secondary | ICD-10-CM | POA: Diagnosis not present

## 2019-07-18 DIAGNOSIS — Z6838 Body mass index (BMI) 38.0-38.9, adult: Secondary | ICD-10-CM

## 2019-07-18 DIAGNOSIS — Z23 Encounter for immunization: Secondary | ICD-10-CM | POA: Diagnosis not present

## 2019-07-18 DIAGNOSIS — D819 Combined immunodeficiency, unspecified: Secondary | ICD-10-CM

## 2019-07-18 DIAGNOSIS — I10 Essential (primary) hypertension: Secondary | ICD-10-CM

## 2019-07-18 MED ORDER — OZEMPIC (0.25 OR 0.5 MG/DOSE) 2 MG/1.5ML ~~LOC~~ SOPN: 0.2500 mg | PEN_INJECTOR | SUBCUTANEOUS | 5 refills | Status: DC

## 2019-07-18 MED ORDER — VALSARTAN 40 MG PO TABS: 40.0000 mg | ORAL_TABLET | Freq: Every day | ORAL | 1 refills | Status: DC

## 2019-07-18 NOTE — Progress Notes (Signed)
Subjective:  Patient ID: Cheryl Wallace, female    DOB: 06-20-1951  Age: 68 y.o. MRN: [id number]  CC: The primary encounter diagnosis was Type 2 diabetes mellitus with nephropathy ([place]). Diagnoses of Need for immunization against influenza, Screening for colon cancer, Essential hypertension, Class 2 severe obesity due to excess calories with serious comorbidity and body mass index (BMI) of 38.0 to 38.9 in adult [facility]), and Depression, major, single episode, mild ([place]) were also pertinent to this visit.  HPI Cheryl Wallace presents for follow up on T2 DM,   Hypertension and depression  Lab Results  Component Value Date   HGBA1C 7.3 (H) 05/01/2019    Post prandial morning sugars for the last several days have been 167 to 190.  discused startign ozempic for weight loss benefits  HTN: not at goal on amlodipine and hctz.  No longer taking telmisartan   Due to sinus pain attributed to medication which eventually resolved when she stopped the medication     Prior sleep study negative test  Due for eye exam ,  Due for colonoscopy at [facility]   Outpatient Medications Prior to Visit  Medication Sig Dispense Refill   amLODipine (NORVASC) 10 MG tablet Take 1 tablet (10 mg total) by mouth at bedtime. 90 tablet 1   atorvastatin (LIPITOR) 20 MG tablet TAKE 1 TABLET BY MOUTH EVERY DAY 90 tablet 1   celecoxib (CELEBREX) 200 MG capsule TAKE 1 CAPSULE BY MOUTH EVERY DAY 30 capsule 2   cholecalciferol (VITAMIN D) 1000 UNITS tablet Take 1,000 Units by mouth daily.      DULoxetine HCl 40 MG CPEP TAKE 1 CAPSULE BY MOUTH EVERY DAY 90 capsule 1   glipiZIDE (GLUCOTROL) 5 MG tablet TAKE 1 TABLET (5 MG TOTAL) BY MOUTH 2 (TWO) TIMES DAILY BEFORE A MEAL. 180 tablet 1   hydrochlorothiazide (HYDRODIURIL) 25 MG tablet Take 1 tablet (25 mg total) by mouth daily. 90 tablet 3   metFORMIN (GLUCOPHAGE) 1000 MG tablet TAKE 1 TABLET BY MOUTH 2 TIMES DAILY WITH A MEAL. 180 tablet 1   omeprazole (PRILOSEC) 40 MG  capsule TAKE 1 CAPSULE BY MOUTH EVERY DAY 90 capsule 3   zolpidem (AMBIEN) 5 MG tablet TAKE 1 TABLET BY MOUTH EVERY DAY AT BEDTIME AS NEEDED 30 tablet 5   ipratropium (ATROVENT) 0.06 % nasal spray Place 2 sprays into both nostrils 4 (four) times daily. 15 mL 12   telmisartan (MICARDIS) 80 MG tablet Take 1 tablet (80 mg total) by mouth daily. 90 tablet 1   No facility-administered medications prior to visit.     Review of Systems;  Patient denies headache, fevers, malaise, unintentional weight loss, skin rash, eye pain, sinus congestion and sinus pain, sore throat, dysphagia,  hemoptysis , cough, dyspnea, wheezing, chest pain, palpitations, orthopnea, edema, abdominal pain, nausea, melena, diarrhea, constipation, flank pain, dysuria, hematuria, urinary  Frequency, nocturia, numbness, tingling, seizures,  Focal weakness, Loss of consciousness,  Tremor, insomnia, depression, anxiety, and suicidal ideation.      Objective:  BP 140/80    Pulse 99    Temp (!) 96 F (35.6 C)    Resp 16    Wt 227 lb (103 kg)    SpO2 97%    BMI 37.77 kg/m   BP Readings from Last 3 Encounters:  07/18/19 140/80  10/11/18 (!) 150/70  08/09/18 110/72    Wt Readings from Last 3 Encounters:  07/18/19 227 lb (103 kg)  07/04/19 227 lb (103 kg)  04/11/19  239 lb (108.4 kg)    General appearance: alert, cooperative and appears stated age Ears: normal TM's and external ear canals both ears Throat: lips, mucosa, and tongue normal; teeth and gums normal Neck: no adenopathy, no carotid bruit, supple, symmetrical, trachea midline and thyroid not enlarged, symmetric, no tenderness/mass/nodules Back: symmetric, no curvature. ROM normal. No CVA tenderness. Lungs: clear to auscultation bilaterally Heart: regular rate and rhythm, S1, S2 normal, no murmur, click, rub or gallop Abdomen: soft, non-tender; bowel sounds normal; no masses,  no organomegaly Pulses: 2+ and symmetric Skin: Skin color, texture, turgor normal. No  rashes or lesions Lymph nodes: Cervical, supraclavicular, and axillary nodes normal.  Lab Results  Component Value Date   HGBA1C 7.3 (H) 05/01/2019   HGBA1C 6.7 (H) 10/11/2018   HGBA1C 6.4 (A) 07/11/2018    Lab Results  Component Value Date   CREATININE 0.85 05/01/2019   CREATININE 0.92 10/11/2018   CREATININE 1.00 07/11/2018    Lab Results  Component Value Date   WBC 5.8 05/01/2019   HGB 11.1 (L) 05/01/2019   HCT 34.4 (L) 05/01/2019   PLT 284.0 05/01/2019   GLUCOSE 121 (H) 05/01/2019   CHOL 103 10/11/2018   TRIG 129.0 10/11/2018   HDL 35.40 (L) 10/11/2018   LDLDIRECT 52.0 09/26/2017   LDLCALC 42 10/11/2018   ALT 15 05/01/2019   AST 13 05/01/2019   NA 139 05/01/2019   K 4.0 05/01/2019   CL 104 05/01/2019   CREATININE 0.85 05/01/2019   BUN 17 05/01/2019   CO2 27 05/01/2019   TSH 2.59 04/05/2018   HGBA1C 7.3 (H) 05/01/2019   MICROALBUR 3.5 (H) 10/11/2018    [place] Limited Joint Space Structures Low Right  Result Date: 07/23/2018 MSK US performed of: Right knee This study was ordered, performed, and interpreted by Charlann Boxer D.O. Knee: Moderate degenerative changes noted of the medial meniscus Patellar Tendon unremarkable on long and transverse views without effusion. No abnormality of prepatellar bursa. LCL and MCL unremarkable on long and transverse views. No abnormality of origin of medial or lateral head of the gastrocnemius.   Degenerative meniscal tear   Dg Knee Complete 4 Views Right  Result Date: 07/17/2018 CLINICAL DATA:  Pain, swelling at medial aspect of knee. EXAM: RIGHT KNEE - COMPLETE 4+ VIEW COMPARISON:  None. FINDINGS: Degenerative changes with joint space narrowing and spurring most pronounced in the patellofemoral compartment. Small joint effusion. Intra-articular loose bodies noted posteriorly over the knee joint. No acute bony abnormality. Specifically, no fracture, subluxation, or dislocation. IMPRESSION: Mild degenerative changes with posterior  intra-articular loose bodies. Small joint effusion. No acute bony abnormality. Electronically Signed   By: Rolm Baptise M.D.   On: 07/17/2018 10:19   Vas [place] Lower Extremity Venous (dvt)  Result Date: 07/18/2018  Lower Venous Study Indications: Pain.  Performing Technologist: Blondell Reveal RT, RDMS, RVT  Examination Guidelines: A complete evaluation includes B-mode imaging, spectral Doppler, color Doppler, and power Doppler as needed of all accessible portions of each vessel. Bilateral testing is considered an integral part of a complete examination. Limited examinations for reoccurring indications may be performed as noted.  Right Venous Findings: +---------+---------------+---------+-----------+----------+-------+            Compressibility Phasicity Spontaneity Properties Summary  +---------+---------------+---------+-----------+----------+-------+  CFV       Full            Yes       Yes                             +---------+---------------+---------+-----------+----------+-------+  SFJ       Full                                                      +---------+---------------+---------+-----------+----------+-------+  FV Prox   Full                                                      +---------+---------------+---------+-----------+----------+-------+  FV Mid    Full            Yes       Yes                             +---------+---------------+---------+-----------+----------+-------+  FV Distal Full                                                      +---------+---------------+---------+-----------+----------+-------+  POP       Full            Yes       Yes                             +---------+---------------+---------+-----------+----------+-------+  PTV       Full                                                      +---------+---------------+---------+-----------+----------+-------+  PERO      Full                                                       +---------+---------------+---------+-----------+----------+-------+  Gastroc   Full                                                      +---------+---------------+---------+-----------+----------+-------+  GSV       Full                                                      +---------+---------------+---------+-----------+----------+-------+  SSV       Full                                                      +---------+---------------+---------+-----------+----------+-------+  Left Venous Findings: +---+---------------+---------+-----------+----------+-------+      Compressibility Phasicity Spontaneity Properties Summary  +---+---------------+---------+-----------+----------+-------+  CFV Full            Yes       Yes                             +---+---------------+---------+-----------+----------+-------+  Summary: Right: No evidence of deep vein thrombosis in the lower extremity. No indirect evidence of obstruction proximal to the inguinal ligament. There is no evidence of superficial venous thrombosis. Left: No evidence of common femoral vein obstruction.  *See table(s) above for measurements and observations. Electronically signed by Leotis Pain MD on 07/18/2018 at 12:34:56 PM.    Final     Assessment & Plan:   Problem List Items Addressed This Visit      Unprioritized   Obesity    Recommending adding  ozempic for control of diabetes       Relevant Medications   Semaglutide,0.25 or 0.5MG /DOS, (OZEMPIC, 0.25 OR 0.5 MG/DOSE,) 2 MG/1.5ML SOPN   Type 2 diabetes mellitus with nephropathy ([place]) - Primary    cbgs not at goal.  Discussed risks and benefits of Ozempic   She is requesting a start of medication to help her lose weight.   Lab Results  Component Value Date   HGBA1C 7.3 (H) 05/01/2019         Relevant Medications   Semaglutide,0.25 or 0.5MG /DOS, (OZEMPIC, 0.25 OR 0.5 MG/DOSE,) 2 MG/1.5ML SOPN   valsartan (DIOVAN) 40 MG tablet   Other Relevant Orders   Hemoglobin A1c    Comprehensive metabolic panel   Lipid panel   Essential hypertension    Adding valsartan.  continue amlodipine and hctz      Relevant Medications   valsartan (DIOVAN) 40 MG tablet   Depression, major, single episode, mild (HCC)    Improved mood with cymbalta.  No changes today        Other Visit Diagnoses    Need for immunization against influenza       Relevant Orders   Flu Vaccine QUAD High Dose(Fluad) (Completed)   Screening for colon cancer       Relevant Orders   Ambulatory referral to Gastroenterology      I have discontinued Uzma F. Mincer's telmisartan and ipratropium. I am also having her start on Ozempic (0.25 or 0.5 MG/DOSE) and valsartan. Additionally, I am having her maintain her cholecalciferol, atorvastatin, glipiZIDE, zolpidem, amLODipine, hydrochlorothiazide, omeprazole, metFORMIN, celecoxib, and DULoxetine HCl.  Meds ordered this encounter  Medications   Semaglutide,0.25 or 0.5MG /DOS, (OZEMPIC, 0.25 OR 0.5 MG/DOSE,) 2 MG/1.5ML SOPN    Sig: Inject 0.25 mg into the skin once a week. For 2 weeks,,  Then 0.5 mg weekly thereafter    Dispense:  1 pen    Refill:  5   valsartan (DIOVAN) 40 MG tablet    Sig: Take 1 tablet (40 mg total) by mouth at bedtime.    Dispense:  90 tablet    Refill:  1    Medications Discontinued During This Encounter  Medication Reason   telmisartan (MICARDIS) 80 MG tablet Error   ipratropium (ATROVENT) 0.06 % nasal spray Error   A total of 25 minutes of face to face time was spent with patient more than half of which was spent in counselling about the above mentioned conditions  and coordination of care   Follow-up: No follow-ups on file.   Crecencio Mc, MD

## 2019-07-18 NOTE — Patient Instructions (Signed)
Your annual mammogram has been ordered.  You are encouraged (required) to call to make your appointment at [facility]  We are starting Ozempic weekly injections to manage your diabetes and help you lose weight.  Start with 0.25 mg dose for the first two weeks. If you tolerate the dose  You can increase to 0.5 mg weekly after the first 2 weeks   I am add valsartan 40 mg at night for blood pressure.  Goal is 130/80 or less after one week.  Continue amlodipine 10 mg and hctz 25 mg  Too   Return for fasting labs after Nov 25  Semaglutide injection solution What is this medicine? SEMAGLUTIDE (Sem a GLOO tide) is used to improve blood sugar control in adults with type 2 diabetes. This medicine may be used with other diabetes medicines. This drug may also reduce the risk of heart attack or stroke if you have type 2 diabetes and risk factors for heart disease. This medicine may be used for other purposes; ask your health care provider or pharmacist if you have questions. COMMON BRAND NAME(S): OZEMPIC What should I tell my health care provider before I take this medicine? They need to know if you have any of these conditions:  endocrine tumors (MEN 2) or if someone in your family had these tumors  eye disease, vision problems  history of pancreatitis  kidney disease  stomach problems  thyroid cancer or if someone in your family had thyroid cancer  an unusual or allergic reaction to semaglutide, other medicines, foods, dyes, or preservatives  pregnant or trying to get pregnant  breast-feeding How should I use this medicine? This medicine is for injection under the skin of your upper leg (thigh), stomach area, or upper arm. It is given once every week (every 7 days). You will be taught how to prepare and give this medicine. Use exactly as directed. Take your medicine at regular intervals. Do not take it more often than directed. If you use this medicine with insulin, you should  inject this medicine and the insulin separately. Do not mix them together. Do not give the injections right next to each other. Change (rotate) injection sites with each injection. It is important that you put your used needles and syringes in a special sharps container. Do not put them in a trash can. If you do not have a sharps container, call your pharmacist or healthcare provider to get one. A special MedGuide will be given to you by the pharmacist with each prescription and refill. Be sure to read this information carefully each time. Talk to your pediatrician regarding the use of this medicine in children. Special care may be needed. Overdosage: If you think you have taken too much of this medicine contact a poison control center or emergency room at once. NOTE: This medicine is only for you. Do not share this medicine with others. What if I miss a dose? If you miss a dose, take it as soon as you can within 5 days after the missed dose. Then take your next dose at your regular weekly time. If it has been longer than 5 days after the missed dose, do not take the missed dose. Take the next dose at your regular time. Do not take double or extra doses. If you have questions about a missed dose, contact your health care provider for advice. What may interact with this medicine?  other medicines for diabetes Many medications may cause changes in blood  sugar, these include:  alcohol containing beverages  antiviral medicines for HIV or AIDS  aspirin and aspirin-like drugs  certain medicines for blood pressure, heart disease, irregular heart beat  chromium  diuretics  female hormones, such as estrogens or progestins, birth control pills  fenofibrate  gemfibrozil  isoniazid  lanreotide  female hormones or anabolic steroids  MAOIs like Carbex, Eldepryl, Marplan, Nardil, and Parnate  medicines for weight loss  medicines for allergies, asthma, cold, or cough  medicines for  depression, anxiety, or psychotic disturbances  niacin  nicotine  NSAIDs, medicines for pain and inflammation, like ibuprofen or naproxen  octreotide  pasireotide  pentamidine  phenytoin  probenecid  quinolone antibiotics such as ciprofloxacin, levofloxacin, ofloxacin  some herbal dietary supplements  steroid medicines such as prednisone or cortisone  sulfamethoxazole; trimethoprim  thyroid hormones Some medications can hide the warning symptoms of low blood sugar (hypoglycemia). You may need to monitor your blood sugar more closely if you are taking one of these medications. These include:  beta-blockers, often used for high blood pressure or heart problems (examples include atenolol, metoprolol, propranolol)  clonidine  guanethidine  reserpine This list may not describe all possible interactions. Give your health care provider a list of all the medicines, herbs, non-prescription drugs, or dietary supplements you use. Also tell them if you smoke, drink alcohol, or use illegal drugs. Some items may interact with your medicine. What should I watch for while using this medicine? Visit your doctor or health care professional for regular checks on your progress. Drink plenty of fluids while taking this medicine. Check with your doctor or health care professional if you get an attack of severe diarrhea, nausea, and vomiting. The loss of too much body fluid can make it dangerous for you to take this medicine. A test called the HbA1C (A1C) will be monitored. This is a simple blood test. It measures your blood sugar control over the last 2 to 3 months. You will receive this test every 3 to 6 months. Learn how to check your blood sugar. Learn the symptoms of low and high blood sugar and how to manage them. Always carry a quick-source of sugar with you in case you have symptoms of low blood sugar. Examples include hard sugar candy or glucose tablets. Make sure others know that you can  choke if you eat or drink when you develop serious symptoms of low blood sugar, such as seizures or unconsciousness. They must get medical help at once. Tell your doctor or health care professional if you have high blood sugar. You might need to change the dose of your medicine. If you are sick or exercising more than usual, you might need to change the dose of your medicine. Do not skip meals. Ask your doctor or health care professional if you should avoid alcohol. Many nonprescription cough and cold products contain sugar or alcohol. These can affect blood sugar. Pens should never be shared. Even if the needle is changed, sharing may result in passing of viruses like hepatitis or HIV. Wear a medical ID bracelet or chain, and carry a card that describes your disease and details of your medicine and dosage times. Do not become pregnant while taking this medicine. Women should inform their doctor if they wish to become pregnant or think they might be pregnant. There is a potential for serious side effects to an unborn child. Talk to your health care professional or pharmacist for more information. What side effects may I notice from  receiving this medicine? Side effects that you should report to your doctor or health care professional as soon as possible:  allergic reactions like skin rash, itching or hives, swelling of the face, lips, or tongue  breathing problems  changes in vision  diarrhea that continues or is severe  lump or swelling on the neck  severe nausea  signs and symptoms of infection like fever or chills; cough; sore throat; pain or trouble passing urine  signs and symptoms of low blood sugar such as feeling anxious, confusion, dizziness, increased hunger, unusually weak or tired, sweating, shakiness, cold, irritable, headache, blurred vision, fast heartbeat, loss of consciousness  signs and symptoms of kidney injury like trouble passing urine or change in the amount of  urine  trouble swallowing  unusual stomach upset or pain  vomiting Side effects that usually do not require medical attention (report to your doctor or health care professional if they continue or are bothersome):  constipation  diarrhea  nausea  pain, redness, or irritation at site where injected  stomach upset This list may not describe all possible side effects. Call your doctor for medical advice about side effects. You may report side effects to FDA at 1-800-FDA-1088. Where should I keep my medicine? Keep out of the reach of children. Store unopened pens in a refrigerator between 2 and 8 degrees C (36 and 46 degrees F). Do not freeze. Protect from light and heat. After you first use the pen, it can be stored for 56 days at room temperature between 15 and 30 degrees C (59 and 86 degrees F) or in a refrigerator. Throw away your used pen after 56 days or after the expiration date, whichever comes first. Do not store your pen with the needle attached. If the needle is left on, medicine may leak from the pen. NOTE: This sheet is a summary. It may not cover all possible information. If you have questions about this medicine, talk to your doctor, pharmacist, or health care provider.  2020 Elsevier/Gold Standard (2018-09-22 14:25:32)

## 2019-07-19 NOTE — Assessment & Plan Note (Signed)
Improved mood with cymbalta.  No changes today

## 2019-07-19 NOTE — Assessment & Plan Note (Signed)
Adding valsartan.  continue amlodipine and hctz

## 2019-07-19 NOTE — Assessment & Plan Note (Signed)
Recommending adding  ozempic for control of diabetes

## 2019-07-19 NOTE — Assessment & Plan Note (Addendum)
cbgs not at goal.  Discussed risks and benefits of Ozempic   She is requesting a start of medication to help her lose weight.   Lab Results  Component Value Date   HGBA1C 7.3 (H) 05/01/2019

## 2019-07-20 ENCOUNTER — Other Ambulatory Visit: Payer: Self-pay | Admitting: Internal Medicine

## 2019-07-20 DIAGNOSIS — Z1231 Encounter for screening mammogram for malignant neoplasm of breast: Secondary | ICD-10-CM

## 2019-07-24 ENCOUNTER — Encounter: Payer: Self-pay | Admitting: Internal Medicine

## 2019-08-07 ENCOUNTER — Other Ambulatory Visit: Payer: Self-pay | Admitting: Internal Medicine

## 2019-08-07 MED ORDER — ONDANSETRON 4 MG PO TBDP: 4.0000 mg | ORAL_TABLET | Freq: Three times a day (TID) | ORAL | 0 refills | Status: DC | PRN

## 2019-08-07 NOTE — Progress Notes (Signed)
zofran

## 2019-08-16 ENCOUNTER — Other Ambulatory Visit (INDEPENDENT_AMBULATORY_CARE_PROVIDER_SITE_OTHER): Payer: BC Managed Care – PPO

## 2019-08-16 ENCOUNTER — Other Ambulatory Visit: Payer: Self-pay

## 2019-08-16 DIAGNOSIS — E1121 Type 2 diabetes mellitus with diabetic nephropathy: Secondary | ICD-10-CM | POA: Diagnosis not present

## 2019-08-16 LAB — COMPREHENSIVE METABOLIC PANEL
ALT: 15 U/L (ref 0–35)
AST: 18 U/L (ref 0–37)
Albumin: 3.7 g/dL (ref 3.5–5.2)
Alkaline Phosphatase: 104 U/L (ref 39–117)
BUN: 21 mg/dL (ref 6–23)
CO2: 31 mEq/L (ref 19–32)
Calcium: 9 mg/dL (ref 8.4–10.5)
Chloride: 98 mEq/L (ref 96–112)
Creatinine, Ser: 0.89 mg/dL (ref 0.40–1.20)
GFR: 63.3 mL/min (ref >60.00)
Glucose, Bld: 165 mg/dL — ABNORMAL HIGH (ref 70–99)
Potassium: 2.9 mEq/L — ABNORMAL LOW (ref 3.5–5.1)
Sodium: 139 mEq/L (ref 135–145)
Total Bilirubin: 0.5 mg/dL (ref 0.2–1.2)
Total Protein: 6.5 g/dL (ref 6.0–8.3)

## 2019-08-16 LAB — POCT GLYCOSYLATED HEMOGLOBIN (HGB A1C)
HbA1c POC (<> result, manual entry): 6.6 % (ref 4.0–5.6)
HbA1c, POC (controlled diabetic range): 6.6 % (ref 0.0–7.0)
HbA1c, POC (prediabetic range): 6.6 % — AB (ref 5.7–6.4)
Hemoglobin A1C: 6.6 % — AB (ref 4.0–5.6)

## 2019-08-16 LAB — LIPID PANEL
Cholesterol: 86 mg/dL (ref 0–200)
HDL: 39.1 mg/dL (ref >39.00)
LDL Cholesterol: 32 mg/dL (ref 0–99)
NonHDL: 46.77
Total CHOL/HDL Ratio: 2
Triglycerides: 74 mg/dL (ref 0.0–149.0)
VLDL: 14.8 mg/dL (ref 0.0–40.0)

## 2019-08-16 LAB — HM DIABETES EYE EXAM

## 2019-08-16 NOTE — Addendum Note (Signed)
Addended by: Elpidio Galea T on: 08/16/2019 08:32 AM   Modules accepted: Orders

## 2019-08-19 ENCOUNTER — Other Ambulatory Visit: Payer: Self-pay | Admitting: Internal Medicine

## 2019-08-19 DIAGNOSIS — E876 Hypokalemia: Secondary | ICD-10-CM

## 2019-08-19 MED ORDER — POTASSIUM CHLORIDE CRYS ER 20 MEQ PO TBCR: 20.0000 meq | EXTENDED_RELEASE_TABLET | Freq: Two times a day (BID) | ORAL | 0 refills | Status: DC

## 2019-08-24 ENCOUNTER — Other Ambulatory Visit: Payer: Self-pay

## 2019-08-24 ENCOUNTER — Other Ambulatory Visit: Payer: Self-pay | Admitting: Internal Medicine

## 2019-08-24 ENCOUNTER — Other Ambulatory Visit (INDEPENDENT_AMBULATORY_CARE_PROVIDER_SITE_OTHER): Payer: BC Managed Care – PPO

## 2019-08-24 DIAGNOSIS — E876 Hypokalemia: Secondary | ICD-10-CM

## 2019-08-24 LAB — BASIC METABOLIC PANEL
BUN: 21 mg/dL (ref 6–23)
CO2: 27 mEq/L (ref 19–32)
Calcium: 9.2 mg/dL (ref 8.4–10.5)
Chloride: 100 mEq/L (ref 96–112)
Creatinine, Ser: 1.05 mg/dL (ref 0.40–1.20)
GFR: 52.3 mL/min — ABNORMAL LOW (ref >60.00)
Glucose, Bld: 140 mg/dL — ABNORMAL HIGH (ref 70–99)
Potassium: 3.4 mEq/L — ABNORMAL LOW (ref 3.5–5.1)
Sodium: 139 mEq/L (ref 135–145)

## 2019-08-24 LAB — MAGNESIUM: Magnesium: 1.3 mg/dL — ABNORMAL LOW (ref 1.5–2.5)

## 2019-08-24 MED ORDER — MAGNESIUM OXIDE 400 MG PO CAPS: 1.0000 | ORAL_CAPSULE | Freq: Every day | ORAL | 0 refills | Status: DC

## 2019-08-24 NOTE — Addendum Note (Signed)
Addended by: Crecencio Mc on: 08/24/2019 05:14 PM   Modules accepted: Orders

## 2019-09-02 ENCOUNTER — Other Ambulatory Visit: Payer: Self-pay | Admitting: Internal Medicine

## 2019-09-04 LAB — HM MAMMOGRAPHY

## 2019-09-06 ENCOUNTER — Other Ambulatory Visit: Payer: BC Managed Care – PPO

## 2019-09-10 ENCOUNTER — Other Ambulatory Visit: Payer: BC Managed Care – PPO

## 2019-09-10 ENCOUNTER — Ambulatory Visit: Payer: BC Managed Care – PPO | Attending: Internal Medicine

## 2019-09-10 DIAGNOSIS — Z20822 Contact with and (suspected) exposure to covid-19: Secondary | ICD-10-CM

## 2019-09-11 LAB — NOVEL CORONAVIRUS, NAA: SARS-CoV-2, NAA: NOT DETECTED

## 2019-09-12 ENCOUNTER — Other Ambulatory Visit: Payer: Self-pay | Admitting: Internal Medicine

## 2019-09-14 ENCOUNTER — Encounter: Payer: Self-pay | Admitting: Internal Medicine

## 2019-09-17 ENCOUNTER — Other Ambulatory Visit: Payer: Self-pay | Admitting: Internal Medicine

## 2019-09-26 ENCOUNTER — Telehealth: Payer: Self-pay | Admitting: Internal Medicine

## 2019-09-26 NOTE — Telephone Encounter (Signed)
Pt has questions about a "DOT physical". Please advise.

## 2019-09-27 ENCOUNTER — Other Ambulatory Visit: Payer: Self-pay | Admitting: Internal Medicine

## 2019-09-27 DIAGNOSIS — E1169 Type 2 diabetes mellitus with other specified complication: Secondary | ICD-10-CM

## 2019-09-27 DIAGNOSIS — E785 Hyperlipidemia, unspecified: Secondary | ICD-10-CM

## 2019-09-27 NOTE — Telephone Encounter (Signed)
I called to inform patient that we do not do DOT physicals here. She stated that she already had an appointment some place on [facility]. Where [employer] requires their employees to go.

## 2019-10-03 ENCOUNTER — Telehealth: Payer: Self-pay | Admitting: Internal Medicine

## 2019-10-03 NOTE — Telephone Encounter (Signed)
A1c result has been faxed.

## 2019-10-03 NOTE — Telephone Encounter (Signed)
[facility] called and they need pt A1C faxed to the [phone]

## 2019-10-04 ENCOUNTER — Other Ambulatory Visit: Payer: BC Managed Care – PPO

## 2019-10-05 ENCOUNTER — Other Ambulatory Visit: Payer: Self-pay | Admitting: Internal Medicine

## 2019-10-14 ENCOUNTER — Ambulatory Visit: Payer: BC Managed Care – PPO

## 2019-10-17 ENCOUNTER — Other Ambulatory Visit: Payer: Self-pay

## 2019-10-19 ENCOUNTER — Other Ambulatory Visit: Payer: Self-pay

## 2019-10-19 ENCOUNTER — Other Ambulatory Visit (INDEPENDENT_AMBULATORY_CARE_PROVIDER_SITE_OTHER): Payer: BC Managed Care – PPO

## 2019-10-19 DIAGNOSIS — E876 Hypokalemia: Secondary | ICD-10-CM | POA: Diagnosis not present

## 2019-10-19 LAB — BASIC METABOLIC PANEL
BUN: 23 mg/dL (ref 6–23)
CO2: 26 mEq/L (ref 19–32)
Calcium: 9.3 mg/dL (ref 8.4–10.5)
Chloride: 104 mEq/L (ref 96–112)
Creatinine, Ser: 0.93 mg/dL (ref 0.40–1.20)
GFR: 60.14 mL/min (ref >60.00)
Glucose, Bld: 156 mg/dL — ABNORMAL HIGH (ref 70–99)
Potassium: 3.6 mEq/L (ref 3.5–5.1)
Sodium: 138 mEq/L (ref 135–145)

## 2019-10-19 LAB — MAGNESIUM: Magnesium: 1.7 mg/dL (ref 1.5–2.5)

## 2019-10-23 ENCOUNTER — Encounter: Payer: Self-pay | Admitting: Internal Medicine

## 2019-10-23 ENCOUNTER — Ambulatory Visit (INDEPENDENT_AMBULATORY_CARE_PROVIDER_SITE_OTHER): Payer: BC Managed Care – PPO | Admitting: Internal Medicine

## 2019-10-23 VITALS — Ht 65.0 in | Wt 227.0 lb

## 2019-10-23 DIAGNOSIS — E876 Hypokalemia: Secondary | ICD-10-CM | POA: Diagnosis not present

## 2019-10-23 DIAGNOSIS — Z6838 Body mass index (BMI) 38.0-38.9, adult: Secondary | ICD-10-CM | POA: Diagnosis not present

## 2019-10-23 DIAGNOSIS — E1121 Type 2 diabetes mellitus with diabetic nephropathy: Secondary | ICD-10-CM | POA: Diagnosis not present

## 2019-10-23 MED ORDER — ONDANSETRON 4 MG PO TBDP: 4.0000 mg | ORAL_TABLET | Freq: Three times a day (TID) | ORAL | 3 refills | Status: DC | PRN

## 2019-10-23 NOTE — Assessment & Plan Note (Signed)
Currently well-controlled on current medications .  hemoglobin A1c is at goal of less than 7.0 . However, she has not tolerated ozempic at higher dose.  Advised to reduce dose to 0.25 mg weekly .  Patient has no microalbuminuria. Patient is tolerating statin therapy for CAD risk reduction and on ACE/ARB for renal protection and hypertension  .    Lab Results  Component Value Date   HGBA1C 6.6 (A) 08/16/2019   HGBA1C 6.6 08/16/2019   HGBA1C 6.6 (A) 08/16/2019   HGBA1C 6.6 08/16/2019

## 2019-10-23 NOTE — Assessment & Plan Note (Signed)
She has lost 12 lbs on ozempic for control of diabetes but has nausea with vomiting

## 2019-10-23 NOTE — Assessment & Plan Note (Signed)
Secondary to hypomagnesemia.  Both resolved on repeat labs,  However she has had recurrent vomiting ; will need repeat monitoring

## 2019-10-23 NOTE — Patient Instructions (Signed)
Do not resume the ozempic until your nausea has completely resolved.  Then Reduce the Ozempic dose to 0.25  Mg weekly   If the nausea returns,  Stop it and follow your 2 hour post meal sugars . Send them to me so I can determine if we need to increase the glipizide dose

## 2019-10-23 NOTE — Progress Notes (Signed)
Virtual Visit via [email or web address]  This visit type was conducted due to national recommendations for restrictions regarding the COVID-19 pandemic (e.g. social distancing).  This format is felt to be most appropriate for this patient at this time.  All issues noted in this document were discussed and addressed.  No physical exam was performed (except for noted visual exam findings with Video Visits).   I connected with@ on 10/23/19 at  2:30 PM EST by a video enabled telemedicine application and verified that I am speaking with the correct person using two identifiers. Location patient: home Location provider: work or home office Persons participating in the virtual visit: patient, provider  I discussed the limitations, risks, security and privacy concerns of performing an evaluation and management service by telephone and the availability of in person appointments. I also discussed with the patient that there may be a patient responsible charge related to this service. The patient expressed understanding and agreed to proceed.  Reason for visit: follow up on type 2 DM  HPI:  69 yr old female with type 2 D M  Recently started on Ozempic in Nov 2020 .  Tolerated the 0.25 mg dose but has had persistent nausea and constipation, since increasing the weekly dose to 0.5 mg ,  Has been having two episodes of vomiting per week.   but a1c has improved .  Wt los of 12 lbs since last visit . Also taking metformin 1000 mg bid and glipizide 5 mg bid.    Had a covid exposure last month which caused her to have to reschedule labs and colonoscopy ,,  TESTED NEGATIVE   Lab Results  Component Value Date   HGBA1C 6.6 (A) 08/16/2019   HGBA1C 6.6 08/16/2019   HGBA1C 6.6 (A) 08/16/2019   HGBA1C 6.6 08/16/2019      ROS: See pertinent positives and negatives per HPI.  Past Medical History:  Diagnosis Date  . Allergic rhinitis   . Complication of anesthesia 1981   While pt was under anesthesia, pt had an asthema  attack during BTL.  . Depression, major, single episode, mild ([place]) 09/04/2016  . Elbow tendonitis    a. bilat, s/p surgery.  . Essential hypertension   . Functional ovarian cysts 2011   a. s/p TAH (1994);  b. s/p oopherectomy (~2011)  . Gallstones   . GERD (gastroesophageal reflux disease)   . Kidney stones 2011   a. s/p lithotripsy  . Low grade fever   . Obesity   . Tinnitus   . Type 2 diabetes mellitus ([place])    a.  Hemoglobin A1c in July 2016, 6.7.    Past Surgical History:  Procedure Laterality Date  . ABDOMINAL HYSTERECTOMY  1994  . COLONOSCOPY  September 2008   Non-bleeding internal hemorhoids identified.  . ELBOW SURGERY    . NASAL SINUS SURGERY  09/18/2015   Procedure: IMAGE GUIDED SINUS SURGERY, BILATERAL MAXILLARY BALLOON SINUPLASTY, BILATERAL FRONTAL BALLOON SINUPLASTY, RIGHT SPHENOID  SINUPLASTY, RIGHT CONCHABULLOSA RESECTION;  Surgeon: Carloyn Manner, MD;  Location: [facility] ORS;  Service: ENT;;  . 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 OPEN REDUCTION INTERNAL FIXATION (ORIF) DISTAL RADIAL FRACTURE Right 02/07/2017   Procedure: OPEN REDUCTION INTERNAL FIXATION (ORIF) DISTAL RADIAL FRACTURE;  Surgeon: Hessie Knows, MD;  Location: [facility] ORS;  Service: Orthopedics;  Laterality: Right;  . PARTIAL HYSTERECTOMY  1994   still has ovaries, removed for fibroids  . TONSILLECTOMY N/A 09/18/2015   Procedure: TONSILLECTOMY;  Surgeon: Carloyn Manner, MD;  Location: [facility] ORS;  Service: ENT;  Laterality: N/A;  . TUBAL LIGATION    . UPPER GI ENDOSCOPY  2011    Family History  Problem Relation Age of Onset  . Cancer Mother        colon  . Tuberculosis Father   . Diabetes Father   . Heart disease Father        MI x 2  . Heart attack Father   . Diabetes Sister        half sister    SOCIAL HX:  reports that she quit smoking about 35 years ago. Her smoking use included cigarettes. She has a 15.00 pack-year smoking history. She has never used smokeless  tobacco. She reports current alcohol use. She reports that she does not use drugs.  Current Outpatient Medications:  .  amLODipine (NORVASC) 10 MG tablet, Take 1 tablet (10 mg total) by mouth at bedtime., Disp: 90 tablet, Rfl: 1 .  atorvastatin (LIPITOR) 20 MG tablet, TAKE 1 TABLET BY MOUTH EVERY DAY, Disp: 90 tablet, Rfl: 1 .  celecoxib (CELEBREX) 200 MG capsule, TAKE 1 CAPSULE BY MOUTH EVERY DAY, Disp: 30 capsule, Rfl: 2 .  cholecalciferol (VITAMIN D) 1000 UNITS tablet, Take 1,000 Units by mouth daily. , Disp: , Rfl:  .  DULoxetine HCl 40 MG CPEP, TAKE 1 CAPSULE BY MOUTH EVERY DAY, Disp: 90 capsule, Rfl: 1 .  glipiZIDE (GLUCOTROL) 5 MG tablet, TAKE 1 TABLET (5 MG TOTAL) BY MOUTH 2 (TWO) TIMES DAILY BEFORE A MEAL., Disp: 180 tablet, Rfl: 1 .  hydrochlorothiazide (HYDRODIURIL) 25 MG tablet, Take 1 tablet (25 mg total) by mouth daily., Disp: 90 tablet, Rfl: 3 .  magnesium oxide (MAG-OX) 400 MG tablet, TAKE 1 CAPSULE (400 MG TOTAL) BY MOUTH DAILY WITH SUPPER., Disp: 30 tablet, Rfl: 0 .  metFORMIN (GLUCOPHAGE) 1000 MG tablet, TAKE 1 TABLET BY MOUTH 2 TIMES DAILY WITH A MEAL., Disp: 180 tablet, Rfl: 1 .  omeprazole (PRILOSEC) 40 MG capsule, TAKE 1 CAPSULE BY MOUTH EVERY DAY, Disp: 90 capsule, Rfl: 3 .  ondansetron (ZOFRAN ODT) 4 MG disintegrating tablet, Take 1 tablet (4 mg total) by mouth every 8 (eight) hours as needed for nausea or vomiting., Disp: 20 tablet, Rfl: 3 .  potassium chloride SA (KLOR-CON) 20 MEQ tablet, Take 1 tablet (20 mEq total) by mouth 2 (two) times daily., Disp: 30 tablet, Rfl: 0 .  Semaglutide,0.25 or 0.5MG /DOS, (OZEMPIC, 0.25 OR 0.5 MG/DOSE,) 2 MG/1.5ML SOPN, Inject 0.25 mg into the skin once a week. For 2 weeks,,  Then 0.5 mg weekly thereafter, Disp: 1 pen, Rfl: 5 .  valsartan (DIOVAN) 40 MG tablet, Take 1 tablet (40 mg total) by mouth at bedtime., Disp: 90 tablet, Rfl: 1 .  zolpidem (AMBIEN) 5 MG tablet, TAKE 1 TABLET BY MOUTH EVERY DAY AT BEDTIME AS NEEDED, Disp: 30 tablet,  Rfl: 5  EXAM:  VITALS per patient if applicable:  GENERAL: alert, oriented, appears well and in no acute distress  HEENT: atraumatic, conjunttiva clear, no obvious abnormalities on inspection of external nose and ears  NECK: normal movements of the head and neck  LUNGS: on inspection no signs of respiratory distress, breathing rate appears normal, no obvious gross SOB, gasping or wheezing  CV: no obvious cyanosis  MS: moves all visible extremities without noticeable abnormality  PSYCH/NEURO: pleasant and cooperative, no obvious depression or anxiety, speech and thought processing grossly intact  ASSESSMENT AND PLAN:  Discussed the following assessment  and plan:  Type 2 diabetes mellitus with nephropathy ([place]) - Plan: Hemoglobin A1c  Hypokalemia - Plan: Comprehensive metabolic panel, Magnesium  Class 2 severe obesity due to excess calories with serious comorbidity and body mass index (BMI) of 38.0 to 38.9 in adult [facility])  Obesity She has lost 12 lbs on ozempic for control of diabetes but has nausea with vomiting    Type 2 diabetes mellitus with nephropathy ([place]) Currently well-controlled on current medications .  hemoglobin A1c is at goal of less than 7.0 . However, she has not tolerated ozempic at higher dose.  Advised to reduce dose to 0.25 mg weekly .  Patient has no microalbuminuria. Patient is tolerating statin therapy for CAD risk reduction and on ACE/ARB for renal protection and hypertension  .    Lab Results  Component Value Date   HGBA1C 6.6 (A) 08/16/2019   HGBA1C 6.6 08/16/2019   HGBA1C 6.6 (A) 08/16/2019   HGBA1C 6.6 08/16/2019     Hypokalemia Secondary to hypomagnesemia.  Both resolved on repeat labs,  However she has had recurrent vomiting ; will need repeat monitoring     I discussed the assessment and treatment plan with the patient. The patient was provided an opportunity to ask questions and all were answered. The patient agreed with the plan and  demonstrated an understanding of the instructions.   The patient was advised to call back or seek an in-person evaluation if the symptoms worsen or if the condition fails to improve as anticipated.  I provided  30 minutes of non-face-to-face time during this encounter reviewing patient's current problems and post surgeries.  Providing counseling on the above mentioned problems , and coordination  of care .  Crecencio Mc, MD

## 2019-10-30 ENCOUNTER — Ambulatory Visit: Payer: BC Managed Care – PPO

## 2019-11-04 ENCOUNTER — Other Ambulatory Visit: Payer: Self-pay | Admitting: Internal Medicine

## 2019-11-05 NOTE — Telephone Encounter (Signed)
Refilled: 05/07/2019 Last OV: 10/23/2019 Next OV: not scheduled

## 2019-12-15 ENCOUNTER — Other Ambulatory Visit: Payer: Self-pay | Admitting: Internal Medicine

## 2019-12-26 ENCOUNTER — Other Ambulatory Visit: Payer: Self-pay | Admitting: Internal Medicine

## 2020-01-07 ENCOUNTER — Other Ambulatory Visit: Payer: Self-pay | Admitting: Internal Medicine

## 2020-01-15 ENCOUNTER — Ambulatory Visit: Payer: BC Managed Care – PPO | Admitting: Internal Medicine

## 2020-01-23 ENCOUNTER — Other Ambulatory Visit: Payer: Self-pay | Admitting: Internal Medicine

## 2020-01-28 ENCOUNTER — Ambulatory Visit: Payer: BC Managed Care – PPO | Admitting: Internal Medicine

## 2020-02-14 ENCOUNTER — Other Ambulatory Visit: Payer: Self-pay

## 2020-02-14 ENCOUNTER — Ambulatory Visit (INDEPENDENT_AMBULATORY_CARE_PROVIDER_SITE_OTHER): Payer: BC Managed Care – PPO | Admitting: Internal Medicine

## 2020-02-14 ENCOUNTER — Encounter: Payer: Self-pay | Admitting: Internal Medicine

## 2020-02-14 VITALS — BP 142/70 | HR 87 | Temp 96.2°F | Resp 15 | Ht 65.0 in | Wt 218.8 lb

## 2020-02-14 DIAGNOSIS — E1121 Type 2 diabetes mellitus with diabetic nephropathy: Secondary | ICD-10-CM | POA: Diagnosis not present

## 2020-02-14 DIAGNOSIS — E785 Hyperlipidemia, unspecified: Secondary | ICD-10-CM | POA: Diagnosis not present

## 2020-02-14 DIAGNOSIS — E876 Hypokalemia: Secondary | ICD-10-CM

## 2020-02-14 DIAGNOSIS — G8929 Other chronic pain: Secondary | ICD-10-CM | POA: Insufficient documentation

## 2020-02-14 DIAGNOSIS — I10 Essential (primary) hypertension: Secondary | ICD-10-CM

## 2020-02-14 DIAGNOSIS — M25511 Pain in right shoulder: Secondary | ICD-10-CM | POA: Diagnosis not present

## 2020-02-14 DIAGNOSIS — E1169 Type 2 diabetes mellitus with other specified complication: Secondary | ICD-10-CM | POA: Diagnosis not present

## 2020-02-14 DIAGNOSIS — M19011 Primary osteoarthritis, right shoulder: Secondary | ICD-10-CM

## 2020-02-14 DIAGNOSIS — J189 Pneumonia, unspecified organism: Secondary | ICD-10-CM

## 2020-02-14 LAB — MAGNESIUM: Magnesium: 1.6 mg/dL (ref 1.5–2.5)

## 2020-02-14 LAB — COMPREHENSIVE METABOLIC PANEL
ALT: 12 U/L (ref 0–35)
AST: 14 U/L (ref 0–37)
Albumin: 3.9 g/dL (ref 3.5–5.2)
Alkaline Phosphatase: 88 U/L (ref 39–117)
BUN: 19 mg/dL (ref 6–23)
CO2: 28 mEq/L (ref 19–32)
Calcium: 9 mg/dL (ref 8.4–10.5)
Chloride: 105 mEq/L (ref 96–112)
Creatinine, Ser: 0.78 mg/dL (ref 0.40–1.20)
GFR: 73.6 mL/min (ref >60.00)
Glucose, Bld: 156 mg/dL — ABNORMAL HIGH (ref 70–99)
Potassium: 3.9 mEq/L (ref 3.5–5.1)
Sodium: 139 mEq/L (ref 135–145)
Total Bilirubin: 0.4 mg/dL (ref 0.2–1.2)
Total Protein: 6.5 g/dL (ref 6.0–8.3)

## 2020-02-14 LAB — LIPID PANEL
Cholesterol: 106 mg/dL (ref 0–200)
HDL: 41.5 mg/dL (ref >39.00)
LDL Cholesterol: 42 mg/dL (ref 0–99)
NonHDL: 64.98
Total CHOL/HDL Ratio: 3
Triglycerides: 115 mg/dL (ref 0.0–149.0)
VLDL: 23 mg/dL (ref 0.0–40.0)

## 2020-02-14 MED ORDER — VALSARTAN 40 MG PO TABS: 40.0000 mg | ORAL_TABLET | Freq: Every day | ORAL | 1 refills | Status: DC

## 2020-02-14 NOTE — Assessment & Plan Note (Addendum)
Posterior, non traumatic.  NSAID/tylenol trial for 4 weeks,  If no improvement ,  Refer to Poggi or other shoulder specialist

## 2020-02-14 NOTE — Progress Notes (Signed)
Subjective:  Patient ID: Cheryl Wallace, female    DOB: 01/23/1951  Age: 69 y.o. MRN: [id number]  CC: The primary encounter diagnosis was Hyperlipidemia associated with type 2 diabetes mellitus ([place]). Diagnoses of Chronic right shoulder pain, Essential hypertension, Hypokalemia, Type 2 diabetes mellitus with nephropathy ([place]), Community acquired pneumonia of right upper lobe of lung, and Arthritis of right acromioclavicular joint were also pertinent to this visit.  HPI APRYLL HINKLE presents for 3 month follow up   This visit occurred during the SARS-CoV-2 public health emergency.  Safety protocols were in place, including screening questions prior to the visit, additional usage of staff PPE, and extensive cleaning of exam room while observing appropriate contact time as indicated for disinfecting solutions.    Patient has received both doses of the available COVID 19 vaccine without complications.  Patient continues to mask when outside of the home except when walking in yard or at safe distances from others .  Patient denies any change in mood or development of unhealthy behaviors resuting from the pandemic's restriction of activities and socialization.    She was treated for CAP in May,  COVID NEGATIVE  .She presented with a  RLL infiltrate,  Fever of 102,  Pulse 132 .  Received prednisone and levaquin. Felt fatigued for 2 weeks   Sleep issues,  Wakes up a lot from shoulder pain right posterior , no history of fall or unusual activity.  She has had no prior evaluation.  Bothering  her at night for at least a month.  Has abnormal x rays showing degenerative arthritis but has tried  bayer, aleve,  And then tried motrin and tylenol combination for 2-3 days with no significant change in pain .   T2DM:  She  feels generally well,  But is not  exercising regularly or trying to lose weight. Checking  blood sugars less than once daily at variable times, usually only if she feels she may be having a  hypoglycemic event. .  BS have been under 130 fasting and < 165 post prandially.  Denies any recent hypoglyemic events.  Taking medications as directed.  Following a carbohydrate modified diet 6 days per week. Denies numbness, burning and tingling of extremities. Appetite is good.   Patient is taking her medications as prescribed and notes no adverse effects.  Home BP readings have been done about once per week and are  generally < 130/80 .  She is avoiding added salt in her diet and walking regularly about 3 times per week for exercise  .  Outpatient Medications Prior to Visit  Medication Sig Dispense Refill  . amLODipine (NORVASC) 10 MG tablet TAKE 1 TABLET BY MOUTH EVERYDAY AT BEDTIME 90 tablet 1  . atorvastatin (LIPITOR) 20 MG tablet TAKE 1 TABLET BY MOUTH EVERY DAY 90 tablet 1  . cholecalciferol (VITAMIN D) 1000 UNITS tablet Take 1,000 Units by mouth daily.     . DULoxetine HCl 40 MG CPEP TAKE 1 CAPSULE BY MOUTH EVERY DAY 90 capsule 1  . glipiZIDE (GLUCOTROL) 5 MG tablet TAKE 1 TABLET (5 MG TOTAL) BY MOUTH 2 (TWO) TIMES DAILY BEFORE A MEAL. 180 tablet 1  . metFORMIN (GLUCOPHAGE) 1000 MG tablet TAKE 1 TABLET BY MOUTH 2 TIMES DAILY WITH A MEAL. 180 tablet 1  . omeprazole (PRILOSEC) 40 MG capsule TAKE 1 CAPSULE BY MOUTH EVERY DAY 90 capsule 3  . ondansetron (ZOFRAN ODT) 4 MG disintegrating tablet Take 1 tablet (4 mg total) by mouth  every 8 (eight) hours as needed for nausea or vomiting. 20 tablet 3  . OZEMPIC, 0.25 OR 0.5 MG/DOSE, 2 MG/1.5ML SOPN INJECT 0.25 MG INTO THE SKIN ONCE A WEEK. FOR 2 WEEKS, THEN 0.5 MG WEEKLY THEREAFTER 1.5 pen 5  . zolpidem (AMBIEN) 5 MG tablet TAKE 1 TABLET BY MOUTH EVERY DAY AT BEDTIME AS NEEDED 30 tablet 5  . celecoxib (CELEBREX) 200 MG capsule TAKE 1 CAPSULE BY MOUTH EVERY DAY 30 capsule 2  . NULYTELY LEMON-LIME 420 g solution     . potassium chloride SA (KLOR-CON) 20 MEQ tablet Take 1 tablet (20 mEq total) by mouth 2 (two) times daily. (Patient not taking:  Reported on 02/14/2020) 30 tablet 0  . hydrochlorothiazide (HYDRODIURIL) 25 MG tablet Take 1 tablet (25 mg total) by mouth daily. (Patient not taking: Reported on 02/14/2020) 90 tablet 3  . magnesium oxide (MAG-OX) 400 MG tablet TAKE 1 CAPSULE (400 MG TOTAL) BY MOUTH DAILY WITH SUPPER. (Patient not taking: Reported on 02/14/2020) 30 tablet 0  . valsartan (DIOVAN) 40 MG tablet Take 1 tablet (40 mg total) by mouth at bedtime. (Patient not taking: Reported on 02/14/2020) 90 tablet 1   No facility-administered medications prior to visit.    Review of Systems;  Patient denies headache, fevers, malaise, unintentional weight loss, skin rash, eye pain, sinus congestion and sinus pain, sore throat, dysphagia,  hemoptysis , cough, dyspnea, wheezing, chest pain, palpitations, orthopnea, edema, abdominal pain, nausea, melena, diarrhea, constipation, flank pain, dysuria, hematuria, urinary  Frequency, nocturia, numbness, tingling, seizures,  Focal weakness, Loss of consciousness,  Tremor, insomnia, depression, anxiety, and suicidal ideation.      Objective:  BP (!) 142/70 (BP Location: Left Arm, Patient Position: Sitting, Cuff Size: Normal)   Pulse 87   Temp (!) 96.2 F (35.7 C) (Temporal)   Resp 15   Ht 5\' 5"  (1.651 m)   Wt 218 lb 12.8 oz (99.2 kg)   SpO2 96%   BMI 36.41 kg/m   BP Readings from Last 3 Encounters:  02/14/20 (!) 142/70  07/18/19 140/80  10/11/18 (!) 150/70    Wt Readings from Last 3 Encounters:  02/14/20 218 lb 12.8 oz (99.2 kg)  10/23/19 227 lb (103 kg)  07/18/19 227 lb (103 kg)    General appearance: alert, cooperative and appears stated age Ears: normal TM's and external ear canals both ears Throat: lips, mucosa, and tongue normal; teeth and gums normal Neck: no adenopathy, no carotid bruit, supple, symmetrical, trachea midline and thyroid not enlarged, symmetric, no tenderness/mass/nodules Back: symmetric, no curvature. ROM normal. No CVA tenderness. Lungs: clear to  auscultation bilaterally Heart: regular rate and rhythm, S1, S2 normal, no murmur, click, rub or gallop Abdomen: soft, non-tender; bowel sounds normal; no masses,  no organomegaly Pulses: 2+ and symmetric Skin: Skin color, texture, turgor normal. No rashes or lesions Lymph nodes: Cervical, supraclavicular, and axillary nodes normal.  Lab Results  Component Value Date   HGBA1C 6.8 (H) 02/14/2020   HGBA1C 6.6 (A) 08/16/2019   HGBA1C 6.6 08/16/2019   HGBA1C 6.6 (A) 08/16/2019   HGBA1C 6.6 08/16/2019    Lab Results  Component Value Date   CREATININE 0.78 02/14/2020   CREATININE 0.93 10/19/2019   CREATININE 1.05 08/24/2019    Lab Results  Component Value Date   WBC 5.8 05/01/2019   HGB 11.1 (L) 05/01/2019   HCT 34.4 (L) 05/01/2019   PLT 284.0 05/01/2019   GLUCOSE 156 (H) 02/14/2020   CHOL 106 02/14/2020  TRIG 115.0 02/14/2020   HDL 41.50 02/14/2020   LDLDIRECT 52.0 09/26/2017   LDLCALC 42 02/14/2020   ALT 12 02/14/2020   AST 14 02/14/2020   NA 139 02/14/2020   K 3.9 02/14/2020   CL 105 02/14/2020   CREATININE 0.78 02/14/2020   BUN 19 02/14/2020   CO2 28 02/14/2020   TSH 2.59 04/05/2018   HGBA1C 6.8 (H) 02/14/2020   MICROALBUR 3.5 (H) 10/11/2018    [place] LIMITED JOINT SPACE STRUCTURES LOW RIGHT  Result Date: 07/23/2018 MSK US performed of: Right knee This study was ordered, performed, and interpreted by Charlann Boxer D.O. Knee: Moderate degenerative changes noted of the medial meniscus Patellar Tendon unremarkable on long and transverse views without effusion. No abnormality of prepatellar bursa. LCL and MCL unremarkable on long and transverse views. No abnormality of origin of medial or lateral head of the gastrocnemius.   Degenerative meniscal tear   DG Knee Complete 4 Views Right  Result Date: 07/17/2018 CLINICAL DATA:  Pain, swelling at medial aspect of knee. EXAM: RIGHT KNEE - COMPLETE 4+ VIEW COMPARISON:  None. FINDINGS: Degenerative changes with joint space  narrowing and spurring most pronounced in the patellofemoral compartment. Small joint effusion. Intra-articular loose bodies noted posteriorly over the knee joint. No acute bony abnormality. Specifically, no fracture, subluxation, or dislocation. IMPRESSION: Mild degenerative changes with posterior intra-articular loose bodies. Small joint effusion. No acute bony abnormality. Electronically Signed   By: Rolm Baptise M.D.   On: 07/17/2018 10:19   VAS [place] LOWER EXTREMITY VENOUS (DVT)  Result Date: 07/18/2018  Lower Venous Study Indications: Pain.  Performing Technologist: Blondell Reveal RT, RDMS, RVT  Examination Guidelines: A complete evaluation includes B-mode imaging, spectral Doppler, color Doppler, and power Doppler as needed of all accessible portions of each vessel. Bilateral testing is considered an integral part of a complete examination. Limited examinations for reoccurring indications may be performed as noted.  Right Venous Findings: +---------+---------------+---------+-----------+----------+-------+          CompressibilityPhasicitySpontaneityPropertiesSummary +---------+---------------+---------+-----------+----------+-------+ CFV      Full           Yes      Yes                          +---------+---------------+---------+-----------+----------+-------+ SFJ      Full                                                 +---------+---------------+---------+-----------+----------+-------+ FV Prox  Full                                                 +---------+---------------+---------+-----------+----------+-------+ FV Mid   Full           Yes      Yes                          +---------+---------------+---------+-----------+----------+-------+ FV DistalFull                                                 +---------+---------------+---------+-----------+----------+-------+  POP      Full           Yes      Yes                           +---------+---------------+---------+-----------+----------+-------+ PTV      Full                                                 +---------+---------------+---------+-----------+----------+-------+ PERO     Full                                                 +---------+---------------+---------+-----------+----------+-------+ Gastroc  Full                                                 +---------+---------------+---------+-----------+----------+-------+ GSV      Full                                                 +---------+---------------+---------+-----------+----------+-------+ SSV      Full                                                 +---------+---------------+---------+-----------+----------+-------+  Left Venous Findings: +---+---------------+---------+-----------+----------+-------+    CompressibilityPhasicitySpontaneityPropertiesSummary +---+---------------+---------+-----------+----------+-------+ CFVFull           Yes      Yes                          +---+---------------+---------+-----------+----------+-------+  Summary: Right: No evidence of deep vein thrombosis in the lower extremity. No indirect evidence of obstruction proximal to the inguinal ligament. There is no evidence of superficial venous thrombosis. Left: No evidence of common femoral vein obstruction.  *See table(s) above for measurements and observations. Electronically signed by Leotis Pain MD on 07/18/2018 at 12:34:56 PM.    Final     Assessment & Plan:   Problem List Items Addressed This Visit      Unprioritized   Acromioclavicular joint arthritis    By plain films done over 5 years ago.  Trial of aleve and tylenol . If no improvement, orthopedics referral       Chronic right shoulder pain    Posterior, non traumatic.  NSAID/tylenol trial for 4 weeks,  If no improvement ,  Refer to [facility] or other shoulder specialist      Community acquired pneumonia of right upper  lobe of lung    Clinically resolved with levaquin.  Repeat chest x ray in 3 weeks       Essential hypertension    Never started valsartan.  Discussed today,  retrun in 3 weeks for BP check and BMET along with repeat Cxr      Relevant Medications   valsartan (DIOVAN) 40 MG  tablet   Hyperlipidemia associated with type 2 diabetes mellitus ([place]) - Primary    LDL is at goal on atorvastatin and LFTS are normal   Lab Results  Component Value Date   CHOL 106 02/14/2020   HDL 41.50 02/14/2020   LDLCALC 42 02/14/2020   LDLDIRECT 52.0 09/26/2017   TRIG 115.0 02/14/2020   CHOLHDL 3 02/14/2020   Lab Results  Component Value Date   ALT 12 02/14/2020   AST 14 02/14/2020   ALKPHOS 88 02/14/2020   BILITOT 0.4 02/14/2020         Relevant Medications   valsartan (DIOVAN) 40 MG tablet   Other Relevant Orders   Lipid panel (Completed)   RESOLVED: Hypokalemia   Type 2 diabetes mellitus with nephropathy ([place])    Remains  well-controlled on lower dose of ozempic  .  hemoglobin A1c is at goal of less than 7.0 .  Patient has early microalbuminuria and needs to start valsartan . Patient is tolerating statin therapy for CAD risk reduction.  She will return for BMET In 3 weeks.   Lab Results  Component Value Date   HGBA1C 6.8 (H) 02/14/2020   Lab Results  Component Value Date   MICROALBUR 3.5 (H) 10/11/2018   MICROALBUR 1.0 01/02/2018           Relevant Medications   valsartan (DIOVAN) 40 MG tablet      I have discontinued Karlina F. Sobocinski's hydrochlorothiazide, magnesium oxide, and celecoxib. I am also having her maintain her cholecalciferol, omeprazole, potassium chloride SA, atorvastatin, glipiZIDE, ondansetron, zolpidem, amLODipine, metFORMIN, DULoxetine HCl, Ozempic (0.25 or 0.5 MG/DOSE), Nulytely Lemon-Lime, and valsartan.  Meds ordered this encounter  Medications  . valsartan (DIOVAN) 40 MG tablet    Sig: Take 1 tablet (40 mg total) by mouth at bedtime.    Dispense:  90  tablet    Refill:  1    Medications Discontinued During This Encounter  Medication Reason  . magnesium oxide (MAG-OX) 400 MG tablet Patient has not taken in last 30 days  . celecoxib (CELEBREX) 200 MG capsule   . hydrochlorothiazide (HYDRODIURIL) 25 MG tablet   . valsartan (DIOVAN) 40 MG tablet Reorder   I provided  30 minutes of  face-to-face time during this encounter reviewing patient's current problems and past surgeries, labs and imaging studies, providing counseling on the above mentioned problems , and coordination  of care .  Follow-up: Return in about 3 weeks (around 03/06/2020).   Crecencio Mc, MD

## 2020-02-14 NOTE — Assessment & Plan Note (Signed)
Never started valsartan.  Discussed today,  retrun in 3 weeks for BP check and BMET along with repeat Cxr

## 2020-02-14 NOTE — Patient Instructions (Addendum)
1 Aleve, and 1000 mg tylenol  Every 12 hours for one week  If you feel no change after one week,  Increase to 2 aleve and 1000 mg tylenol    Your blood pressure is above goal on amlodipine alone  Adding valsartan 40 mg one  Time  Daily  contineu amlodipine as well    Return in 3 weeks for nurse visit ,  kidney function test and repeat chest x ray

## 2020-02-15 LAB — HEMOGLOBIN A1C: Hgb A1c MFr Bld: 6.8 % — ABNORMAL HIGH (ref 4.6–6.5)

## 2020-02-17 DIAGNOSIS — M19019 Primary osteoarthritis, unspecified shoulder: Secondary | ICD-10-CM | POA: Insufficient documentation

## 2020-02-17 DIAGNOSIS — J189 Pneumonia, unspecified organism: Secondary | ICD-10-CM | POA: Insufficient documentation

## 2020-02-17 NOTE — Assessment & Plan Note (Addendum)
By plain films done over 5 years ago.  Trial of aleve and tylenol . If no improvement, orthopedics referral

## 2020-02-17 NOTE — Assessment & Plan Note (Signed)
LDL is at goal on atorvastatin and LFTS are normal   Lab Results  Component Value Date   CHOL 106 02/14/2020   HDL 41.50 02/14/2020   LDLCALC 42 02/14/2020   LDLDIRECT 52.0 09/26/2017   TRIG 115.0 02/14/2020   CHOLHDL 3 02/14/2020   Lab Results  Component Value Date   ALT 12 02/14/2020   AST 14 02/14/2020   ALKPHOS 88 02/14/2020   BILITOT 0.4 02/14/2020

## 2020-02-17 NOTE — Assessment & Plan Note (Addendum)
Remains  well-controlled on lower dose of ozempic  .  hemoglobin A1c is at goal of less than 7.0 .  Patient has early microalbuminuria and needs to start valsartan . Patient is tolerating statin therapy for CAD risk reduction.  She will return for BMET In 3 weeks.   Lab Results  Component Value Date   HGBA1C 6.8 (H) 02/14/2020   Lab Results  Component Value Date   MICROALBUR 3.5 (H) 10/11/2018   MICROALBUR 1.0 01/02/2018

## 2020-02-17 NOTE — Assessment & Plan Note (Signed)
Clinically resolved with levaquin.  Repeat chest x ray in 3 weeks

## 2020-02-21 ENCOUNTER — Other Ambulatory Visit: Payer: Self-pay

## 2020-02-21 ENCOUNTER — Other Ambulatory Visit
Admission: RE | Admit: 2020-02-21 | Discharge: 2020-02-21 | Disposition: A | Discharge status: Home / Self Care | Payer: BC Managed Care – PPO | Source: Ambulatory Visit | Attending: Internal Medicine | Admitting: Internal Medicine

## 2020-02-21 DIAGNOSIS — Z01812 Encounter for preprocedural laboratory examination: Secondary | ICD-10-CM | POA: Diagnosis present

## 2020-02-21 DIAGNOSIS — Z20822 Contact with and (suspected) exposure to covid-19: Secondary | ICD-10-CM | POA: Insufficient documentation

## 2020-02-21 LAB — SARS CORONAVIRUS 2 (TAT 6-24 HRS): SARS Coronavirus 2: NEGATIVE

## 2020-02-22 ENCOUNTER — Encounter: Payer: Self-pay | Admitting: Internal Medicine

## 2020-02-25 ENCOUNTER — Other Ambulatory Visit: Payer: Self-pay

## 2020-02-25 ENCOUNTER — Encounter
Admission: RE | Disposition: A | Discharge status: Home / Self Care | Payer: Self-pay | Source: Home / Self Care | Attending: Internal Medicine

## 2020-02-25 ENCOUNTER — Encounter: Payer: Self-pay | Admitting: Internal Medicine

## 2020-02-25 ENCOUNTER — Ambulatory Visit
Admission: RE | Admit: 2020-02-25 | Discharge: 2020-02-25 | Disposition: A | Discharge status: Home / Self Care | Payer: BC Managed Care – PPO | Attending: Internal Medicine | Admitting: Internal Medicine

## 2020-02-25 ENCOUNTER — Ambulatory Visit: Payer: BC Managed Care – PPO | Admitting: Certified Registered"

## 2020-02-25 DIAGNOSIS — Z1211 Encounter for screening for malignant neoplasm of colon: Secondary | ICD-10-CM | POA: Diagnosis present

## 2020-02-25 DIAGNOSIS — K640 First degree hemorrhoids: Secondary | ICD-10-CM | POA: Diagnosis not present

## 2020-02-25 DIAGNOSIS — G4700 Insomnia, unspecified: Secondary | ICD-10-CM | POA: Insufficient documentation

## 2020-02-25 DIAGNOSIS — Z7984 Long term (current) use of oral hypoglycemic drugs: Secondary | ICD-10-CM | POA: Diagnosis not present

## 2020-02-25 DIAGNOSIS — Z800 Family history of malignant neoplasm of digestive organs: Secondary | ICD-10-CM | POA: Diagnosis not present

## 2020-02-25 DIAGNOSIS — Z87891 Personal history of nicotine dependence: Secondary | ICD-10-CM | POA: Diagnosis not present

## 2020-02-25 DIAGNOSIS — K219 Gastro-esophageal reflux disease without esophagitis: Secondary | ICD-10-CM | POA: Diagnosis not present

## 2020-02-25 DIAGNOSIS — Z79899 Other long term (current) drug therapy: Secondary | ICD-10-CM | POA: Insufficient documentation

## 2020-02-25 DIAGNOSIS — Z87442 Personal history of urinary calculi: Secondary | ICD-10-CM | POA: Diagnosis not present

## 2020-02-25 DIAGNOSIS — E119 Type 2 diabetes mellitus without complications: Secondary | ICD-10-CM | POA: Insufficient documentation

## 2020-02-25 DIAGNOSIS — I10 Essential (primary) hypertension: Secondary | ICD-10-CM | POA: Diagnosis not present

## 2020-02-25 DIAGNOSIS — Z791 Long term (current) use of non-steroidal anti-inflammatories (NSAID): Secondary | ICD-10-CM | POA: Insufficient documentation

## 2020-02-25 DIAGNOSIS — E669 Obesity, unspecified: Secondary | ICD-10-CM | POA: Diagnosis not present

## 2020-02-25 DIAGNOSIS — E785 Hyperlipidemia, unspecified: Secondary | ICD-10-CM | POA: Diagnosis not present

## 2020-02-25 DIAGNOSIS — F329 Major depressive disorder, single episode, unspecified: Secondary | ICD-10-CM | POA: Diagnosis not present

## 2020-02-25 DIAGNOSIS — Z6835 Body mass index (BMI) 35.0-35.9, adult: Secondary | ICD-10-CM | POA: Diagnosis not present

## 2020-02-25 DIAGNOSIS — Z7982 Long term (current) use of aspirin: Secondary | ICD-10-CM | POA: Insufficient documentation

## 2020-02-25 HISTORY — DX: Hyperlipidemia, unspecified: E78.5

## 2020-02-25 HISTORY — DX: Pain in unspecified joint: M25.50

## 2020-02-25 HISTORY — PX: COLONOSCOPY WITH PROPOFOL: SHX5780

## 2020-02-25 HISTORY — DX: Insomnia, unspecified: G47.00

## 2020-02-25 HISTORY — DX: Other seasonal allergic rhinitis: J30.2

## 2020-02-25 LAB — GLUCOSE, CAPILLARY: Glucose-Capillary: 134 mg/dL — ABNORMAL HIGH (ref 70–99)

## 2020-02-25 SURGERY — COLONOSCOPY WITH PROPOFOL
Anesthesia: General

## 2020-02-25 MED ORDER — LABETALOL HCL 5 MG/ML IV SOLN
INTRAVENOUS | Status: AC
  Filled 2020-02-25: qty 4

## 2020-02-25 MED ORDER — PROPOFOL 500 MG/50ML IV EMUL
INTRAVENOUS | Status: AC
  Filled 2020-02-25: qty 50

## 2020-02-25 MED ORDER — SODIUM CHLORIDE 0.9 % IV SOLN
INTRAVENOUS | Status: DC
  Administered 2020-02-25: 1000 mL via INTRAVENOUS

## 2020-02-25 MED ORDER — LIDOCAINE HCL (CARDIAC) PF 100 MG/5ML IV SOSY
PREFILLED_SYRINGE | INTRAVENOUS | Status: DC | PRN
  Administered 2020-02-25: 100 mg via INTRAVENOUS

## 2020-02-25 MED ORDER — PROPOFOL 500 MG/50ML IV EMUL
INTRAVENOUS | Status: DC | PRN
  Administered 2020-02-25: 100 mg via INTRAVENOUS
  Administered 2020-02-25: 50 mg via INTRAVENOUS
  Administered 2020-02-25: 100 mg via INTRAVENOUS

## 2020-02-25 NOTE — Op Note (Signed)
[facility] Patient Name: Cheryl Wallace Procedure Date: 02/25/2020 8:29 AM MRN: [id number] Account #: [id number] Date of Birth: [birth year] Admit Type: Outpatient Age: 69 Room: [facility] ENDO ROOM 3 Gender: Female Note Status: Finalized Procedure:             Colonoscopy Indications:           Screening in patient at increased risk: Family history                         of 1st-degree relative with colorectal cancer Providers:             Benay Pike. Daymen Hassebrock MD, MD Medicines:             Propofol per Anesthesia Complications:         No immediate complications. Procedure:             Pre-Anesthesia Assessment:                        - The risks and benefits of the procedure and the                         sedation options and risks were discussed with the                         patient. All questions were answered and informed                         consent was obtained.                        - Patient identification and proposed procedure were                         verified prior to the procedure by the nurse. The                         procedure was verified in the procedure room.                        - ASA Grade Assessment: III - A patient with severe                         systemic disease.                        - After reviewing the risks and benefits, the patient                         was deemed in satisfactory condition to undergo the                         procedure.                        After obtaining informed consent, the colonoscope was                         passed under direct vision. Throughout the procedure,  the patient's blood pressure, pulse, and oxygen                         saturations were monitored continuously. The                         Colonoscope was introduced through the anus and                         advanced to the the cecum, identified by appendiceal                         orifice  and ileocecal valve. The colonoscopy was                         performed without difficulty. The patient tolerated                         the procedure well. The quality of the bowel                         preparation was good. The ileocecal valve, appendiceal                         orifice, and rectum were photographed. Findings:      The perianal and digital rectal examinations were normal. Pertinent       negatives include normal sphincter tone and no palpable rectal lesions.      The colon (entire examined portion) appeared normal.      Non-bleeding internal hemorrhoids were found during retroflexion. The       hemorrhoids were Grade I (internal hemorrhoids that do not prolapse).      The exam was otherwise without abnormality. Impression:            - The entire examined colon is normal.                        - Non-bleeding internal hemorrhoids.                        - The examination was otherwise normal.                        - No specimens collected. Recommendation:        - Patient has a contact number available for                         emergencies. The signs and symptoms of potential                         delayed complications were discussed with the patient.                         Return to normal activities tomorrow. Written                         discharge instructions were provided to the patient.                        -  Resume previous diet.                        - Continue present medications.                        - Repeat colonoscopy in 5 years for screening purposes.                        - The findings and recommendations were discussed with                         the patient. Procedure Code(s):     --- Professional ---                        T6256, Colorectal cancer screening; colonoscopy on                         individual at high risk Diagnosis Code(s):     --- Professional ---                        K64.0, First degree hemorrhoids                         Z80.0, Family history of malignant neoplasm of                         digestive organs CPT copyright 2019 American Medical Association. All rights reserved. The codes documented in this report are preliminary and upon coder review may  be revised to meet current compliance requirements. Efrain Sella MD, MD 02/25/2020 8:51:42 AM This report has been signed electronically. Number of Addenda: 0 Note Initiated On: 02/25/2020 8:29 AM Scope Withdrawal Time: 0 hours 7 minutes 21 seconds  Total Procedure Duration: 0 hours 13 minutes 43 seconds  Estimated Blood Loss:  Estimated blood loss: none.      [facility]

## 2020-02-25 NOTE — Interval H&P Note (Signed)
History and Physical Interval Note:  02/25/2020 8:29 AM  Cheryl Wallace  has presented today for surgery, with the diagnosis of FAMILY HX.OF COLON CANCER.  The various methods of treatment have been discussed with the patient and family. After consideration of risks, benefits and other options for treatment, the patient has consented to  Procedure(s): COLONOSCOPY WITH PROPOFOL (N/A) as a surgical intervention.  The patient's history has been reviewed, patient examined, no change in status, stable for surgery.  I have reviewed the patient's chart and labs.  Questions were answered to the patient's satisfaction.     [place], [place]

## 2020-02-25 NOTE — Transfer of Care (Signed)
Immediate Anesthesia Transfer of Care Note  Patient: Cheryl Wallace  Procedure(s) Performed: COLONOSCOPY WITH PROPOFOL (N/A )  Patient Location: Endoscopy Unit  Anesthesia Type:General  Level of Consciousness: drowsy  Airway & Oxygen Therapy: Patient Spontanous Breathing  Post-op Assessment: Report given to RN  Post vital signs: Reviewed  Last Vitals:  Vitals Value Taken Time  BP 174/80 02/25/20 0905  Temp 36.3 C 02/25/20 0858  Pulse 85 02/25/20 0905  Resp 24 02/25/20 0905  SpO2 98 % 02/25/20 0905  Vitals shown include unvalidated device data.  Last Pain:  Vitals:   02/25/20 0858  TempSrc: Oral  PainSc:          Complications: No complications documented.

## 2020-02-25 NOTE — H&P (Signed)
Outpatient short stay form Pre-procedure 02/25/2020 8:29 AM Ayush Boulet K. Alice Reichert, M.D.  Primary Physician: Deborra Medina, M.D.  Reason for visit:  Family history of colon cancer  History of present illness: 69 year old patient presenting for family history of colon cancer. Patient denies any change in bowel habits, rectal bleeding or involuntary weight loss.     Current Facility-Administered Medications:  .  0.9 %  sodium chloride infusion, , Intravenous, Continuous, [place], Benay Pike, MD, Last Rate: 20 mL/hr at 02/25/20 0811, 1,000 mL at 02/25/20 5329  Medications Prior to Admission  Medication Sig Dispense Refill Last Dose  . amLODipine (NORVASC) 10 MG tablet TAKE 1 TABLET BY MOUTH EVERYDAY AT BEDTIME 90 tablet 1 Past Week at Unknown time  . aspirin 81 MG chewable tablet Chew 81 mg by mouth daily.   Past Week at Unknown time  . atorvastatin (LIPITOR) 20 MG tablet TAKE 1 TABLET BY MOUTH EVERY DAY 90 tablet 1 Past Week at Unknown time  . celecoxib (CELEBREX) 200 MG capsule Take 200 mg by mouth 2 (two) times daily.     . cholecalciferol (VITAMIN D) 1000 UNITS tablet Take 1,000 Units by mouth daily.    Past Week at Unknown time  . DULoxetine HCl 40 MG CPEP TAKE 1 CAPSULE BY MOUTH EVERY DAY 90 capsule 1 Past Week at Unknown time  . etodolac (LODINE) 500 MG tablet Take 500 mg by mouth 2 (two) times daily.   Past Week at Unknown time  . glipiZIDE (GLUCOTROL) 5 MG tablet TAKE 1 TABLET (5 MG TOTAL) BY MOUTH 2 (TWO) TIMES DAILY BEFORE A MEAL. 180 tablet 1 Past Week at Unknown time  . losartan (COZAAR) 50 MG tablet Take 50 mg by mouth daily.   Past Week at Unknown time  . metFORMIN (GLUCOPHAGE) 1000 MG tablet TAKE 1 TABLET BY MOUTH 2 TIMES DAILY WITH A MEAL. 180 tablet 1 Past Week at Unknown time  . potassium chloride SA (KLOR-CON) 20 MEQ tablet Take 1 tablet (20 mEq total) by mouth 2 (two) times daily. 30 tablet 0 Past Week at Unknown time  . telmisartan (MICARDIS) 80 MG tablet Take 80 mg by mouth  daily.   Past Week at Unknown time  . valsartan (DIOVAN) 40 MG tablet Take 1 tablet (40 mg total) by mouth at bedtime. 90 tablet 1 Past Week at Unknown time  . zolpidem (AMBIEN) 5 MG tablet TAKE 1 TABLET BY MOUTH EVERY DAY AT BEDTIME AS NEEDED 30 tablet 5 Past Week at Unknown time  . NULYTELY LEMON-LIME 420 g solution      . omeprazole (PRILOSEC) 40 MG capsule TAKE 1 CAPSULE BY MOUTH EVERY DAY 90 capsule 3   . ondansetron (ZOFRAN ODT) 4 MG disintegrating tablet Take 1 tablet (4 mg total) by mouth every 8 (eight) hours as needed for nausea or vomiting. 20 tablet 3   . OZEMPIC, 0.25 OR 0.5 MG/DOSE, 2 MG/1.5ML SOPN INJECT 0.25 MG INTO THE SKIN ONCE A WEEK. FOR 2 WEEKS, THEN 0.5 MG WEEKLY THEREAFTER 1.5 pen 5      Allergies  Allergen Reactions  . Hctz [Hydrochlorothiazide]     Muscle cramps  . Lisinopril Cough    cough  . Shellfish Allergy Swelling    Swelling of face, lips and hands. No difficulty breathing.  [name] Penicillins Rash     Past Medical History:  Diagnosis Date  . Allergic rhinitis   . Complication of anesthesia 1981   While pt was under anesthesia, pt had  an asthema attack during BTL.  . Depression, major, single episode, mild ([place]) 09/04/2016  . Elbow tendonitis    a. bilat, s/p surgery.  . Essential hypertension   . Functional ovarian cysts 2011   a. s/p TAH (1994);  b. s/p oopherectomy (~2011)  . Gallstones   . GERD (gastroesophageal reflux disease)   . Hyperlipidemia   . Insomnia   . Kidney stones 2011   a. s/p lithotripsy  . Low grade fever   . Obesity   . Polyarthralgia   . Seasonal allergies   . Tinnitus   . Type 2 diabetes mellitus ([place])    a.  Hemoglobin A1c in July 2016, 6.7.    Review of systems:  Otherwise negative.    Physical Exam  Gen: Alert, oriented. Appears stated age.  HEENT: [place]/AT. PERRLA. Lungs: CTA, no wheezes. CV: RR nl S1, S2. Abd: soft, benign, no masses. BS+ Ext: No edema. Pulses 2+    Planned procedures: Proceed with  colonoscopy. The patient understands the nature of the planned procedure, indications, risks, alternatives and potential complications including but not limited to bleeding, infection, perforation, damage to internal organs and possible oversedation/side effects from anesthesia. The patient agrees and gives consent to proceed.  Please refer to procedure notes for findings, recommendations and patient disposition/instructions.     Ulah Olmo K. Alice Reichert, M.D. Gastroenterology 02/25/2020  8:29 AM

## 2020-02-25 NOTE — Anesthesia Preprocedure Evaluation (Signed)
Anesthesia Evaluation  Patient identified by MRN, date of birth, ID band Patient awake    Reviewed: Allergy & Precautions, NPO status , Patient's Chart, lab work & pertinent test results  History of Anesthesia Complications Negative for: history of anesthetic complications  Airway Mallampati: II  TM Distance: >3 FB Neck ROM: Full    Dental  (+) Upper Dentures, Lower Dentures   Pulmonary neg sleep apnea, neg COPD, former smoker,    breath sounds clear to auscultation- rhonchi (-) wheezing      Cardiovascular hypertension, Pt. on medications (-) angina(-) CAD and (-) Past MI  Rhythm:Regular Rate:Normal - Systolic murmurs and - Diastolic murmurs    Neuro/Psych PSYCHIATRIC DISORDERS Depression    GI/Hepatic Neg liver ROS, GERD  ,  Endo/Other  diabetes, Oral Hypoglycemic Agents  Renal/GU Renal disease (hx of nephrolithiasis)     Musculoskeletal negative musculoskeletal ROS (+)   Abdominal (+) + obese,   Peds  Hematology negative hematology ROS (+)   Anesthesia Other Findings Past Medical History: No date: Allergic rhinitis 1655: Complication of anesthesia     Comment: While pt was under anesthesia, pt had an               asthema attack during BTL. 09/04/2016: Depression, major, single episode, mild ([facility]) No date: Elbow tendonitis     Comment: a. bilat, s/p surgery. No date: Essential hypertension 2011: Functional ovarian cysts     Comment: a. s/p TAH (1994);  b. s/p oopherectomy               (~2011) No date: Gallstones No date: GERD (gastroesophageal reflux disease) 2011: Kidney stones     Comment: a. s/p lithotripsy No date: Low grade fever No date: Obesity No date: Tinnitus No date: Type 2 diabetes mellitus ([place])     Comment: a.  Hemoglobin A1c in July 2016, 6.7.   Reproductive/Obstetrics                             Anesthesia Physical  Anesthesia Plan  ASA:  III  Anesthesia Plan: General   Post-op Pain Management:    Induction: Intravenous  PONV Risk Score and Plan: Propofol infusion and TIVA  Airway Management Planned: Natural Airway and Nasal Cannula  Additional Equipment:   Intra-op Plan:   Post-operative Plan: Extubation in OR  Informed Consent: I have reviewed the patients History and Physical, chart, labs and discussed the procedure including the risks, benefits and alternatives for the proposed anesthesia with the patient or authorized representative who has indicated his/her understanding and acceptance.     Dental advisory given  Plan Discussed with: CRNA and Anesthesiologist  Anesthesia Plan Comments:         Anesthesia Quick Evaluation

## 2020-02-25 NOTE — Anesthesia Postprocedure Evaluation (Signed)
Anesthesia Post Note  Patient: Cheryl Wallace  Procedure(s) Performed: COLONOSCOPY WITH PROPOFOL (N/A )  Patient location during evaluation: Endoscopy Anesthesia Type: General Level of consciousness: awake and alert Pain management: pain level controlled Vital Signs Assessment: post-procedure vital signs reviewed and stable Respiratory status: spontaneous breathing, nonlabored ventilation, respiratory function stable and patient connected to nasal cannula oxygen Cardiovascular status: blood pressure returned to baseline and stable Postop Assessment: no apparent nausea or vomiting Anesthetic complications: no   No complications documented.   Last Vitals:  Vitals:   02/25/20 0920 02/25/20 0927  BP: (!) 169/77 (!) 180/70  Pulse: 75 72  Resp: 15 (!) 25  Temp:    SpO2: 98% 98%    Last Pain:  Vitals:   02/25/20 0858  TempSrc: Oral  PainSc:                  Martha Clan

## 2020-02-26 ENCOUNTER — Encounter: Payer: Self-pay | Admitting: Internal Medicine

## 2020-03-04 ENCOUNTER — Telehealth: Payer: Self-pay | Admitting: *Deleted

## 2020-03-04 ENCOUNTER — Encounter: Payer: Self-pay | Admitting: *Deleted

## 2020-03-04 DIAGNOSIS — I10 Essential (primary) hypertension: Secondary | ICD-10-CM

## 2020-03-04 DIAGNOSIS — J189 Pneumonia, unspecified organism: Secondary | ICD-10-CM

## 2020-03-04 NOTE — Telephone Encounter (Signed)
CHEST X RAY AND BMET ORDERED.  WAS SUPPOSED TO HAVE AN RN VISIT TOO TO MEASURE BP

## 2020-03-04 NOTE — Telephone Encounter (Signed)
Thanks, I have added her to the nurse schedule but there we no appt to match her lab appt. I will send pt a mychart to let her know also.

## 2020-03-04 NOTE — Telephone Encounter (Signed)
Appt note states lab & x-ray. No orders found for either.  Please place future orders for lab & X-ray appt.

## 2020-03-06 ENCOUNTER — Other Ambulatory Visit: Payer: BC Managed Care – PPO

## 2020-03-06 ENCOUNTER — Ambulatory Visit (INDEPENDENT_AMBULATORY_CARE_PROVIDER_SITE_OTHER): Payer: BC Managed Care – PPO

## 2020-03-06 ENCOUNTER — Other Ambulatory Visit: Payer: Self-pay

## 2020-03-06 DIAGNOSIS — I10 Essential (primary) hypertension: Secondary | ICD-10-CM

## 2020-03-06 DIAGNOSIS — J189 Pneumonia, unspecified organism: Secondary | ICD-10-CM

## 2020-03-06 LAB — BASIC METABOLIC PANEL
BUN: 22 mg/dL (ref 6–23)
CO2: 24 mEq/L (ref 19–32)
Calcium: 9 mg/dL (ref 8.4–10.5)
Chloride: 103 mEq/L (ref 96–112)
Creatinine, Ser: 0.8 mg/dL (ref 0.40–1.20)
GFR: 71.47 mL/min (ref >60.00)
Glucose, Bld: 234 mg/dL — ABNORMAL HIGH (ref 70–99)
Potassium: 3.5 mEq/L (ref 3.5–5.1)
Sodium: 139 mEq/L (ref 135–145)

## 2020-03-06 NOTE — Progress Notes (Addendum)
Patient is here for a BP check due to bp being high at last visit, as per patient.  Currently patients BP is 128/76 and BPM is 86.  Patient has no complaints of headaches, blurry vision, chest pain, arm pain, light headedness, dizziness, and nor jaw pain. Please see previous note for order.   BP is at goal.  No medication changes are needed   Regards,   Deborra Medina, MD

## 2020-03-14 ENCOUNTER — Other Ambulatory Visit: Payer: Self-pay | Admitting: Internal Medicine

## 2020-03-14 DIAGNOSIS — E1169 Type 2 diabetes mellitus with other specified complication: Secondary | ICD-10-CM

## 2020-03-17 ENCOUNTER — Telehealth: Payer: Self-pay | Admitting: Internal Medicine

## 2020-03-17 NOTE — Telephone Encounter (Signed)
MyChart message sent re normal chest x ray

## 2020-04-09 ENCOUNTER — Other Ambulatory Visit: Payer: Self-pay | Admitting: Internal Medicine

## 2020-05-01 ENCOUNTER — Other Ambulatory Visit: Payer: Self-pay | Admitting: Internal Medicine

## 2020-05-02 NOTE — Telephone Encounter (Signed)
Refilled: 11/05/2019 Last OV: 02/14/2020 Next OV: 06/16/2020

## 2020-06-16 ENCOUNTER — Ambulatory Visit: Payer: BC Managed Care – PPO | Admitting: Internal Medicine

## 2020-06-22 ENCOUNTER — Other Ambulatory Visit: Payer: Self-pay | Admitting: Internal Medicine

## 2020-07-01 ENCOUNTER — Other Ambulatory Visit: Payer: Self-pay | Admitting: Orthopedic Surgery

## 2020-07-01 DIAGNOSIS — S76109A Unspecified injury of unspecified quadriceps muscle, fascia and tendon, initial encounter: Secondary | ICD-10-CM

## 2020-07-02 ENCOUNTER — Other Ambulatory Visit: Payer: Self-pay | Admitting: Internal Medicine

## 2020-07-04 ENCOUNTER — Ambulatory Visit
Admission: RE | Admit: 2020-07-04 | Discharge: 2020-07-04 | Disposition: A | Discharge status: Home / Self Care | Payer: No Typology Code available for payment source | Source: Ambulatory Visit | Attending: Orthopedic Surgery | Admitting: Orthopedic Surgery

## 2020-07-04 ENCOUNTER — Other Ambulatory Visit: Payer: Self-pay

## 2020-07-04 DIAGNOSIS — S76109A Unspecified injury of unspecified quadriceps muscle, fascia and tendon, initial encounter: Secondary | ICD-10-CM | POA: Diagnosis present

## 2020-07-15 ENCOUNTER — Other Ambulatory Visit: Payer: Self-pay | Admitting: Internal Medicine

## 2020-07-17 ENCOUNTER — Encounter: Payer: Self-pay | Admitting: Internal Medicine

## 2020-07-17 ENCOUNTER — Ambulatory Visit (INDEPENDENT_AMBULATORY_CARE_PROVIDER_SITE_OTHER): Payer: BC Managed Care – PPO | Admitting: Internal Medicine

## 2020-07-17 ENCOUNTER — Other Ambulatory Visit: Payer: Self-pay

## 2020-07-17 VITALS — BP 142/74 | HR 81 | Temp 98.1°F | Resp 15 | Ht 65.0 in | Wt 229.8 lb

## 2020-07-17 DIAGNOSIS — Z6838 Body mass index (BMI) 38.0-38.9, adult: Secondary | ICD-10-CM

## 2020-07-17 DIAGNOSIS — M25561 Pain in right knee: Secondary | ICD-10-CM

## 2020-07-17 DIAGNOSIS — Z23 Encounter for immunization: Secondary | ICD-10-CM | POA: Diagnosis not present

## 2020-07-17 DIAGNOSIS — I10 Essential (primary) hypertension: Secondary | ICD-10-CM | POA: Diagnosis not present

## 2020-07-17 DIAGNOSIS — D509 Iron deficiency anemia, unspecified: Secondary | ICD-10-CM | POA: Diagnosis not present

## 2020-07-17 DIAGNOSIS — S8991XS Unspecified injury of right lower leg, sequela: Secondary | ICD-10-CM

## 2020-07-17 DIAGNOSIS — E1121 Type 2 diabetes mellitus with diabetic nephropathy: Secondary | ICD-10-CM | POA: Diagnosis not present

## 2020-07-17 DIAGNOSIS — M2550 Pain in unspecified joint: Secondary | ICD-10-CM

## 2020-07-17 DIAGNOSIS — F320 Major depressive disorder, single episode, mild: Secondary | ICD-10-CM

## 2020-07-17 DIAGNOSIS — E66812 Obesity, class 2: Secondary | ICD-10-CM

## 2020-07-17 LAB — CBC WITH DIFFERENTIAL/PLATELET
Basophils Absolute: 0.1 10*3/uL (ref 0.0–0.1)
Basophils Relative: 1.1 % (ref 0.0–3.0)
Eosinophils Absolute: 0.3 10*3/uL (ref 0.0–0.7)
Eosinophils Relative: 4.5 % (ref 0.0–5.0)
HCT: 33.7 % — ABNORMAL LOW (ref 36.0–46.0)
Hemoglobin: 10.7 g/dL — ABNORMAL LOW (ref 12.0–15.0)
Lymphocytes Relative: 35.4 % (ref 12.0–46.0)
Lymphs Abs: 2 10*3/uL (ref 0.7–4.0)
MCHC: 31.8 g/dL (ref 30.0–36.0)
MCV: 75.4 fl — ABNORMAL LOW (ref 78.0–100.0)
Monocytes Absolute: 0.4 10*3/uL (ref 0.1–1.0)
Monocytes Relative: 7.3 % (ref 3.0–12.0)
Neutro Abs: 2.9 10*3/uL (ref 1.4–7.7)
Neutrophils Relative %: 51.7 % (ref 43.0–77.0)
Platelets: 252 10*3/uL (ref 150.0–400.0)
RBC: 4.47 Mil/uL (ref 3.87–5.11)
RDW: 16 % — ABNORMAL HIGH (ref 11.5–15.5)
WBC: 5.6 10*3/uL (ref 4.0–10.5)

## 2020-07-17 LAB — COMPREHENSIVE METABOLIC PANEL
ALT: 16 U/L (ref 0–35)
AST: 14 U/L (ref 0–37)
Albumin: 3.9 g/dL (ref 3.5–5.2)
Alkaline Phosphatase: 96 U/L (ref 39–117)
BUN: 18 mg/dL (ref 6–23)
CO2: 28 mEq/L (ref 19–32)
Calcium: 8.9 mg/dL (ref 8.4–10.5)
Chloride: 103 mEq/L (ref 96–112)
Creatinine, Ser: 0.87 mg/dL (ref 0.40–1.20)
GFR: 68.8 mL/min (ref >60.00)
Glucose, Bld: 145 mg/dL — ABNORMAL HIGH (ref 70–99)
Potassium: 4.2 mEq/L (ref 3.5–5.1)
Sodium: 138 mEq/L (ref 135–145)
Total Bilirubin: 0.6 mg/dL (ref 0.2–1.2)
Total Protein: 6.1 g/dL (ref 6.0–8.3)

## 2020-07-17 LAB — MICROALBUMIN / CREATININE URINE RATIO
Creatinine,U: 165.6 mg/dL
Microalb Creat Ratio: 3.8 mg/g (ref 0.0–30.0)
Microalb, Ur: 6.2 mg/dL — ABNORMAL HIGH (ref 0.0–1.9)

## 2020-07-17 LAB — TSH: TSH: 1.1 u[IU]/mL (ref 0.35–4.50)

## 2020-07-17 LAB — HEMOGLOBIN A1C: Hgb A1c MFr Bld: 8.1 % — ABNORMAL HIGH (ref 4.6–6.5)

## 2020-07-17 MED ORDER — HYDROCODONE-ACETAMINOPHEN 10-325 MG PO TABS: 1.0000 | ORAL_TABLET | Freq: Every day | ORAL | 0 refills | Status: DC | PRN

## 2020-07-17 MED ORDER — ZOSTER VAC RECOMB ADJUVANTED 50 MCG/0.5ML IM SUSR: 0.5000 mL | Freq: Once | INTRAMUSCULAR | 1 refills | Status: AC

## 2020-07-17 NOTE — Progress Notes (Signed)
Subjective:  Patient ID: Cheryl Wallace, female    DOB: [birth year]  Age: 69 y.o. MRN: [id number]  CC: The primary encounter diagnosis was Essential hypertension. Diagnoses of Iron deficiency anemia, unspecified iron deficiency anemia type, Type 2 diabetes mellitus with nephropathy ([place]), Morbid obesity (HCC), Class 2 severe obesity due to excess calories with serious comorbidity and body mass index (BMI) of 38.0 to 38.9 in adult [facility]), Depression, major, single episode, mild ([place]), Need for immunization against influenza, Right knee injury, sequela, Need for 23-polyvalent pneumococcal polysaccharide vaccine, Polyarthralgia, and Posterior right knee pain were also pertinent to this visit.  HPI Cheryl Wallace presents for 3 MONTH FOLLOW UPON TYPE  2 DIABETES,  HYPERTENSION, HYPERLIPIDEMIA AND CHRONIC PAIN   This visit occurred during the SARS-CoV-2 public health emergency.  Safety protocols were in place, including screening questions prior to the visit, additional usage of staff PPE, and extensive cleaning of exam room while observing appropriate contact time as indicated for disinfecting solutions.   Seeing Ortho for right knee pain with effusion secondary to avulsion of patella and tricompartment DJD . Had a steroid injectio within the past two weeks. She is wearing knee brace.  The avulsion is expected to heal without surgery .  Still able to work . Walks all day long .  Treated initially on oct 22.  Given Vicodin #20 by urgent care  .  Requesting refill drives a bus   HTN: getting BP checked regularly by school RN.  Readings have been > 140/80 on current doses of  amlodipine and valsartan  T2DM:  She Did not tolerate ozempic due to recurrent nausea and vomiting so she stopped it in September.  All GI issues have resolved.  She did not bring her glucometer log today but states that her fastings have been  < 130 and post prandials have been around 150  .  Still taking metformin.  UTD on eye and foot  exams    Outpatient Medications Prior to Visit  Medication Sig Dispense Refill   amLODipine (NORVASC) 10 MG tablet TAKE 1 TABLET BY MOUTH EVERYDAY AT BEDTIME 90 tablet 1   aspirin 81 MG chewable tablet Chew 81 mg by mouth daily.     atorvastatin (LIPITOR) 20 MG tablet TAKE 1 TABLET BY MOUTH EVERY DAY 90 tablet 1   celecoxib (CELEBREX) 200 MG capsule TAKE 1 CAPSULE BY MOUTH EVERY DAY 30 capsule 2   cholecalciferol (VITAMIN D) 1000 UNITS tablet Take 1,000 Units by mouth daily.      DULoxetine HCl 40 MG CPEP TAKE 1 CAPSULE BY MOUTH EVERY DAY 90 capsule 1   glipiZIDE (GLUCOTROL) 5 MG tablet TAKE 1 TABLET (5 MG TOTAL) BY MOUTH 2 (TWO) TIMES DAILY BEFORE A MEAL. 180 tablet 1   metFORMIN (GLUCOPHAGE) 1000 MG tablet TAKE 1 TABLET BY MOUTH 2 TIMES DAILY WITH A MEAL. 180 tablet 1   omeprazole (PRILOSEC) 40 MG capsule TAKE 1 CAPSULE BY MOUTH EVERY DAY 90 capsule 3   ondansetron (ZOFRAN ODT) 4 MG disintegrating tablet Take 1 tablet (4 mg total) by mouth every 8 (eight) hours as needed for nausea or vomiting. 20 tablet 3   valsartan (DIOVAN) 40 MG tablet Take 1 tablet (40 mg total) by mouth at bedtime. 90 tablet 1   zolpidem (AMBIEN) 5 MG tablet TAKE 1 TABLET BY MOUTH EVERY DAY AT BEDTIME AS NEEDED 15 tablet 5   losartan (COZAAR) 50 MG tablet Take 50 mg by mouth daily.  telmisartan (MICARDIS) 80 MG tablet Take 80 mg by mouth daily.     etodolac (LODINE) 500 MG tablet Take 500 mg by mouth 2 (two) times daily. (Patient not taking: Reported on 07/17/2020)     NULYTELY LEMON-LIME 420 g solution  (Patient not taking: Reported on 07/17/2020)     OZEMPIC, 0.25 OR 0.5 MG/DOSE, 2 MG/1.5ML SOPN INJECT 0.25 MG INTO THE SKIN ONCE A WEEK. FOR 2 WEEKS, THEN 0.5 MG WEEKLY THEREAFTER (Patient not taking: Reported on 07/17/2020) 1.5 pen 5   potassium chloride SA (KLOR-CON) 20 MEQ tablet Take 1 tablet (20 mEq total) by mouth 2 (two) times daily. (Patient not taking: Reported on 07/17/2020) 30 tablet 0    No facility-administered medications prior to visit.    Review of Systems;  Patient denies headache, fevers, malaise, unintentional weight loss, skin rash, eye pain, sinus congestion and sinus pain, sore throat, dysphagia,  hemoptysis , cough, dyspnea, wheezing, chest pain, palpitations, orthopnea, edema, abdominal pain, nausea, melena, diarrhea, constipation, flank pain, dysuria, hematuria, urinary  Frequency, nocturia, numbness, tingling, seizures,  Focal weakness, Loss of consciousness,  Tremor, insomnia, depression, anxiety, and suicidal ideation.      Objective:  BP (!) 142/74 (BP Location: Left Arm, Patient Position: Sitting, Cuff Size: Large)    Pulse 81    Temp 98.1 F (36.7 C) (Oral)    Resp 15    Ht 5\' 5"  (1.651 m)    Wt 229 lb 12.8 oz (104.2 kg)    SpO2 96%    BMI 38.24 kg/m   BP Readings from Last 3 Encounters:  07/17/20 (!) 142/74  03/06/20 128/76  02/25/20 (!) 180/70    Wt Readings from Last 3 Encounters:  07/17/20 229 lb 12.8 oz (104.2 kg)  02/25/20 213 lb (96.6 kg)  02/14/20 218 lb 12.8 oz (99.2 kg)    General appearance: alert, cooperative and appears stated age Ears: normal TM's and external ear canals both ears Throat: lips, mucosa, and tongue normal; teeth and gums normal Neck: no adenopathy, no carotid bruit, supple, symmetrical, trachea midline and thyroid not enlarged, symmetric, no tenderness/mass/nodules Back: symmetric, no curvature. ROM normal. No CVA tenderness. Lungs: clear to auscultation bilaterally Heart: regular rate and rhythm, S1, S2 normal, no murmur, click, rub or gallop Abdomen: soft, non-tender; bowel sounds normal; no masses,  no organomegaly Pulses: 2+ and symmetric Skin: Skin color, texture, turgor normal. No rashes or lesions Lymph nodes: Cervical, supraclavicular, and axillary nodes normal.  Lab Results  Component Value Date   HGBA1C 8.1 (H) 07/17/2020   HGBA1C 6.8 (H) 02/14/2020   HGBA1C 6.6 (A) 08/16/2019   HGBA1C 6.6  08/16/2019   HGBA1C 6.6 (A) 08/16/2019   HGBA1C 6.6 08/16/2019    Lab Results  Component Value Date   CREATININE 0.87 07/17/2020   CREATININE 0.80 03/06/2020   CREATININE 0.78 02/14/2020    Lab Results  Component Value Date   WBC 5.6 07/17/2020   HGB 10.7 (L) 07/17/2020   HCT 33.7 (L) 07/17/2020   PLT 252.0 07/17/2020   GLUCOSE 145 (H) 07/17/2020   CHOL 106 02/14/2020   TRIG 115.0 02/14/2020   HDL 41.50 02/14/2020   LDLDIRECT 52.0 09/26/2017   LDLCALC 42 02/14/2020   ALT 16 07/17/2020   AST 14 07/17/2020   NA 138 07/17/2020   K 4.2 07/17/2020   CL 103 07/17/2020   CREATININE 0.87 07/17/2020   BUN 18 07/17/2020   CO2 28 07/17/2020   TSH 1.10 07/17/2020   HGBA1C 8.1 (  H) 07/17/2020   MICROALBUR 6.2 (H) 07/17/2020    MR KNEE RIGHT WO CONTRAST  Result Date: 07/04/2020 CLINICAL DATA:  Anterior knee pain after injury 06/23/2020 EXAM: MRI OF THE RIGHT KNEE WITHOUT CONTRAST TECHNIQUE: Multiplanar, multisequence MR imaging of the knee was performed. No intravenous contrast was administered. COMPARISON:  X-ray 07/17/2018 FINDINGS: MENISCI Medial meniscus: Intrasubstance degeneration with complex undersurface tearing at the posterior horn-body junction (series 10, images 24-26). Lateral meniscus:  Intact with mild intrasubstance degeneration. LIGAMENTS Cruciates:  Intact ACL and PCL. Collaterals: Intact MCL. There is a multilobulated cystic structure overlying the superficial aspect of the MCL measuring 2.3 x 0.5 x 1.8 cm, likely ganglion cyst (series 11, image 16). Lateral collateral ligament complex intact. CARTILAGE Patellofemoral: Full-thickness chondral loss of the medial patellar facet with underlying subchondral marrow signal changes. Partial-thickness focal chondral fissure involving the lateral patellar facet (series 8, image 11). There are areas of full-thickness chondral loss within the trochlea. Medial: Chondral thinning and surface irregularity involving the weight-bearing  and nonweightbearing aspects of the medial compartment. Lateral: Tiny irregular fissuring of the central weight-bearing lateral femoral condyle. Joint: Small joint effusion. Numerous intra-articular loose bodies at the posterior joint line, largest measuring up to 11 mm (series 10, image 19). Fat pads within normal limits. Popliteal Fossa:  Small Baker's cyst.  Intact popliteus tendon. Extensor Mechanism:  Intact quadriceps tendon and patellar tendon. Bones: Mild tricompartmental joint space narrowing with small marginal osteophyte formation. Degenerative subchondral marrow signal changes within the patellofemoral compartment. No bone marrow edema or suspicious bone lesion. Other: Subtle intramuscular edema within the visualized gastrocnemius musculature, which may reflect muscle strain. IMPRESSION: 1. Degenerative undersurface tearing of the medial meniscus posterior horn-body junction. 2. Tricompartmental osteoarthritis, most advanced within the patellofemoral compartment. Small knee joint effusion with multiple intra-articular loose bodies at the posterior joint line, largest measuring up to 11 mm. 3. Small Baker's cyst. 4. Intact MCL with adjacent ganglion cyst measuring up to 2.3 cm. 5. Subtle intramuscular edema within the visualized gastrocnemius musculature, which may reflect muscle strain. Electronically Signed   By: Davina Poke D.O.   On: 07/04/2020 08:33    Assessment & Plan:   Problem List Items Addressed This Visit      Unprioritized   Depression, major, single episode, mild ([place])    Improved mood with cymbalta for pain management   No changes today       Essential hypertension - Primary    Elevated today ,but readings at work have been "normal" per work Therapist, sports.  Advised to submit readings       Iron deficiency anemia    No longer taking iron.  Had colonoscopy June 2021.  Repeat levels are due   Lab Results  Component Value Date   WBC 5.8 05/01/2019   HGB 11.1 (L) 05/01/2019    HCT 34.4 (L) 05/01/2019   MCV 82.2 05/01/2019   PLT 284.0 05/01/2019         Relevant Orders   CBC with Differential/Platelet (Completed)   Iron, TIBC and Ferritin Panel (Completed)   Obesity    Did not tolerate Ozempic.  Discussed her current topic of interest ,  The "thrive" diet that her colleagues at work are doing. I have addressed  BMI and recommended wt loss of 10% of body weight over the next 6 months using a low fat, low starch, high protein  fruit/vegetable based Mediterranean diet.  She is unable to exercise currently due to right knee injury  Relevant Orders   TSH (Completed)   Polyarthralgia   Posterior right knee pain    Secondary to avulsion pf patella and tricompartmental DJD. Pain is controlled during the day, but by nightfall her pain is moderate and interrupting her sleep.  Refilling vicodin for once daily use at night       Right knee injury, sequela    Seeing Orthopedics .  Treatment is nonsurgical.  Wearing a brace .  Will refill Vicodin       Type 2 diabetes mellitus with nephropathy ([place])    Did not tolerate even the lower dose of ozempic due to recurrent nausea and vomiting.  She has been taking metformin and glipizide since September.  Patient has early microalbuminuria and is now taking  valsartan . Patient is tolerating statin therapy for CAD risk reduction.  Will discuss starting Farxiga  Lab Results  Component Value Date   HGBA1C 8.1 (H) 07/17/2020   Lab Results  Component Value Date   MICROALBUR 6.2 (H) 07/17/2020   MICROALBUR 3.5 (H) 10/11/2018           Relevant Orders   Hemoglobin A1c (Completed)   Comprehensive metabolic panel (Completed)   Microalbumin / creatinine urine ratio (Completed)    Other Visit Diagnoses    Morbid obesity ([place])       Need for immunization against influenza       Relevant Orders   Flu Vaccine QUAD High Dose(Fluad) (Completed)   Need for 23-polyvalent pneumococcal polysaccharide vaccine        Relevant Orders   Pneumococcal polysaccharide vaccine 23-valent greater than or equal to 2yo subcutaneous/IM (Completed)      I provided  30 minutes of  face-to-face time during this encounter reviewing patient's current problems and past surgeries, labs and imaging studies, providing counseling on the above mentioned problems , and coordination  of care .  I have discontinued Evangelene Vora. [place]ora's potassium chloride SA, Ozempic (0.25 or 0.5 MG/DOSE), Nulytely Lemon-Lime, etodolac, losartan, and telmisartan. I am also having her start on Zoster Vaccine Adjuvanted. Additionally, I am having her maintain her cholecalciferol, ondansetron, valsartan, aspirin, atorvastatin, glipiZIDE, zolpidem, metFORMIN, DULoxetine HCl, amLODipine, omeprazole, celecoxib, and HYDROcodone-acetaminophen.  Meds ordered this encounter  Medications   Zoster Vaccine Adjuvanted [facility]) injection    Sig: Inject 0.5 mLs into the muscle once for 1 dose.    Dispense:  1 each    Refill:  1   DISCONTD: HYDROcodone-acetaminophen (NORCO) 10-325 MG tablet    Sig: Take 1 tablet by mouth daily as needed.    Dispense:  30 tablet    Refill:  0   HYDROcodone-acetaminophen (NORCO) 10-325 MG tablet    Sig: Take 1 tablet by mouth daily as needed.    Dispense:  30 tablet    Refill:  0    Medications Discontinued During This Encounter  Medication Reason   etodolac (LODINE) 500 MG tablet    NULYTELY LEMON-LIME 420 g solution    OZEMPIC, 0.25 OR 0.5 MG/DOSE, 2 MG/1.5ML SOPN    potassium chloride SA (KLOR-CON) 20 MEQ tablet    telmisartan (MICARDIS) 80 MG tablet    losartan (COZAAR) 50 MG tablet    HYDROcodone-acetaminophen (NORCO) 10-325 MG tablet     Follow-up: No follow-ups on file.   Crecencio Mc, MD

## 2020-07-17 NOTE — Assessment & Plan Note (Addendum)
Elevated today ,but readings at work have been "normal" per work Therapist, sports.  Advised to submit readings

## 2020-07-17 NOTE — Assessment & Plan Note (Signed)
Improved mood with cymbalta for pain management   No changes today

## 2020-07-17 NOTE — Assessment & Plan Note (Addendum)
Did not tolerate Ozempic.  Discussed her current topic of interest ,  The "thrive" diet that her colleagues at work are doing. I have addressed  BMI and recommended wt loss of 10% of body weight over the next 6 months using a low fat, low starch, high protein  fruit/vegetable based Mediterranean diet.  She is unable to exercise currently due to right knee injury

## 2020-07-17 NOTE — Patient Instructions (Addendum)
   I Will refill your  vicodin tonight for nightly use   You received the high dose flu vaccine today   Healthy Choice low carb power bowl entrees,  Lean cuisine's "stir fry /cauliflower rice" and  "chicken and broccoli alfredo"   Entrees,  and [employer] cauliflower pizza bowls are ALL  great low carb entrees that microwave in 5 minutes    PLEASE get theRN at work to check you BP again and get me the readings

## 2020-07-17 NOTE — Assessment & Plan Note (Addendum)
Did not tolerate even the lower dose of ozempic due to recurrent nausea and vomiting.  She has been taking metformin and glipizide since September.  Patient has early microalbuminuria and is now taking  valsartan . Patient is tolerating statin therapy for CAD risk reduction.  Will discuss starting Farxiga  Lab Results  Component Value Date   HGBA1C 8.1 (H) 07/17/2020   Lab Results  Component Value Date   MICROALBUR 6.2 (H) 07/17/2020   MICROALBUR 3.5 (H) 10/11/2018

## 2020-07-17 NOTE — Assessment & Plan Note (Signed)
Seeing Orthopedics .  Treatment is nonsurgical.  Wearing a brace .  Will refill Vicodin

## 2020-07-17 NOTE — Assessment & Plan Note (Signed)
No longer taking iron.  Had colonoscopy June 2021.  Repeat levels are due   Lab Results  Component Value Date   WBC 5.8 05/01/2019   HGB 11.1 (L) 05/01/2019   HCT 34.4 (L) 05/01/2019   MCV 82.2 05/01/2019   PLT 284.0 05/01/2019

## 2020-07-18 LAB — IRON,TIBC AND FERRITIN PANEL
%SAT: 15 % (calc) — ABNORMAL LOW (ref 16–45)
Ferritin: 6 ng/mL — ABNORMAL LOW (ref 16–288)
Iron: 67 ug/dL (ref 45–160)
TIBC: 459 mcg/dL (calc) — ABNORMAL HIGH (ref 250–450)

## 2020-07-18 NOTE — Progress Notes (Signed)
Your anemia has worsened without iron supplementation   If you can tolerate a daily OTC iron supplement without nausea or constipation, I would recommend resuming one tablet daily with orange juice   Your diabetes is not under control anymore on metformin and glipizide.  Your a1c has jumped to 8.1  I  would like you to consider starting a diabetes medication called Jardiance.  Cheryl Wallace is an SLGT 2  Inhibitor .  It works to lower blood sugars by routing the excess sugar through the kidneys into the urine. It does make you urinate more, but it has been proven to have a  protective effect on  your heart .  It actually lowers  your risk for heart attack   . Please let me know if you are willing to try it and I will send it to your pharmacy.  If the copay is too high ,  let me know  .  We should repeat all labs in 3 months   Regards,  Dr. Derrel Nip

## 2020-07-20 NOTE — Assessment & Plan Note (Signed)
Secondary to avulsion pf patella and tricompartmental DJD. Pain is controlled during the day, but by nightfall her pain is moderate and interrupting her sleep.  Refilling vicodin for once daily use at night

## 2020-07-22 ENCOUNTER — Telehealth: Payer: Self-pay

## 2020-07-22 NOTE — Telephone Encounter (Signed)
Medication has been approved through 01/19/2021.

## 2020-07-22 NOTE — Telephone Encounter (Signed)
PA submitted for Hydrocodone on covermymeds.

## 2020-08-01 ENCOUNTER — Other Ambulatory Visit: Payer: Self-pay | Admitting: Internal Medicine

## 2020-09-04 ENCOUNTER — Other Ambulatory Visit: Payer: Self-pay | Admitting: Internal Medicine

## 2020-09-04 DIAGNOSIS — E1169 Type 2 diabetes mellitus with other specified complication: Secondary | ICD-10-CM

## 2020-09-04 DIAGNOSIS — E785 Hyperlipidemia, unspecified: Secondary | ICD-10-CM

## 2020-09-22 ENCOUNTER — Encounter: Payer: Self-pay | Admitting: Internal Medicine

## 2020-09-22 ENCOUNTER — Telehealth (INDEPENDENT_AMBULATORY_CARE_PROVIDER_SITE_OTHER): Payer: BC Managed Care – PPO | Admitting: Internal Medicine

## 2020-09-22 DIAGNOSIS — S83241S Other tear of medial meniscus, current injury, right knee, sequela: Secondary | ICD-10-CM

## 2020-09-22 DIAGNOSIS — I10 Essential (primary) hypertension: Secondary | ICD-10-CM

## 2020-09-22 DIAGNOSIS — D509 Iron deficiency anemia, unspecified: Secondary | ICD-10-CM | POA: Diagnosis not present

## 2020-09-22 DIAGNOSIS — E1121 Type 2 diabetes mellitus with diabetic nephropathy: Secondary | ICD-10-CM

## 2020-09-22 HISTORY — DX: Other tear of medial meniscus, current injury, right knee, sequela: S83.241S

## 2020-09-22 MED ORDER — TRAMADOL HCL 50 MG PO TABS
50.0000 mg | ORAL_TABLET | Freq: Four times a day (QID) | ORAL | 0 refills | Status: DC | PRN
Start: 2020-09-22 — End: 2020-09-29

## 2020-09-22 MED ORDER — TANDEM 162-115.2 MG PO CAPS: 1.0000 | ORAL_CAPSULE | Freq: Every day | ORAL | 2 refills | Status: DC

## 2020-09-22 NOTE — Assessment & Plan Note (Signed)
Readings have been elevated while taking meloxicam 7.5 mg bid.  Advised to suspend medication and continue amlodipine 10 mg and valsartan 40 mg daily

## 2020-09-22 NOTE — Progress Notes (Signed)
Virtual Visit converted to Telephone  Note  This visit type was conducted due to national recommendations for restrictions regarding the COVID-19 pandemic (e.g. social distancing).  This format is felt to be most appropriate for this patient at this time.  All issues noted in this document were discussed and addressed.  No physical exam was performed (except for noted visual exam findings with Video Visits).   I connected with@ on 09/22/20 at  9:30 AM EST by  telephone and verified that I am speaking with the correct person using two identifiers. Location patient: home Location provider: work or home office Persons participating in the virtual visit: patient, provider  I discussed the limitations, risks, security and privacy concerns of performing an evaluation and management service by telephone and the availability of in person appointments. I also discussed with the patient that there may be a patient responsible charge related to this service. The patient expressed understanding and agreed to proceed.  Interactive audio and video telecommunications were attempted between this provider and patient, however failed, due to patient having technical difficulties .  We continued and completed visit with audio only.  Reason for visit: follow up on hypertension, diabetes and IDA   HPI:  69 yr old female with chronic joint pain , recent  medial meniscal tear  type 2 DM with microalbuminuria, hypertension and IDA presents with concerns of elevated BP.  Has had several elevated readings, first  from the [facility]  Measured at 160 /86 ,  157/78 but most recent reading was  134/80 at the same walk in clinic approximately one week later.  Does not check home readings . Using meloxicam 7.5 mg twice daily for right knee pain  but had not used it the morning of the 3rd reading  . Discussed stopping   DM:  Has modified diet and sugars for the past month have ranged from 70 fasting (twice, no symptoms)  To  150 post prandially.  Taking metformin and glipizide.  due for eye exam.  History of intolerance to Ozzempic   IDA:   Has not been taking iron supplements.  Colonoscopy normal Jan 2021 except for internal hemorrhoids.   Denies bleeding   ROS: See pertinent positives and negatives per HPI.  Past Medical History:  Diagnosis Date  . Allergic rhinitis   . Complication of anesthesia 1981   While pt was under anesthesia, pt had an asthema attack during BTL.  . Depression, major, single episode, mild ([place]) 09/04/2016  . Elbow tendonitis    a. bilat, s/p surgery.  . Essential hypertension   . Functional ovarian cysts 2011   a. s/p TAH (1994);  b. s/p oopherectomy (~2011)  . Gallstones   . GERD (gastroesophageal reflux disease)   . Hyperlipidemia   . Insomnia   . Kidney stones 2011   a. s/p lithotripsy  . Low grade fever   . Obesity   . Polyarthralgia   . Seasonal allergies   . Tinnitus   . Type 2 diabetes mellitus ([place])    a.  Hemoglobin A1c in July 2016, 6.7.    Past Surgical History:  Procedure Laterality Date  . ABDOMINAL HYSTERECTOMY  1994  . COLONOSCOPY  September 2008   Non-bleeding internal hemorhoids identified.  . COLONOSCOPY WITH PROPOFOL N/A 02/25/2020   Procedure: COLONOSCOPY WITH PROPOFOL;  Surgeon: Toledo, Benay Pike, MD;  Location: [facility];  Service: Gastroenterology;  Laterality: N/A;  . ELBOW SURGERY    . FRACTURE SURGERY    .  NASAL SINUS SURGERY  09/18/2015   Procedure: IMAGE GUIDED SINUS SURGERY, BILATERAL MAXILLARY BALLOON SINUPLASTY, BILATERAL FRONTAL BALLOON SINUPLASTY, RIGHT SPHENOID  SINUPLASTY, RIGHT CONCHABULLOSA RESECTION;  Surgeon: Carloyn Manner, MD;  Location: [facility];  Service: ENT;;  . 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 OPEN REDUCTION INTERNAL FIXATION (ORIF) DISTAL RADIAL FRACTURE Right 02/07/2017   Procedure: OPEN REDUCTION INTERNAL FIXATION (ORIF) DISTAL RADIAL FRACTURE;  Surgeon: Hessie Knows, MD;   Location: [facility];  Service: Orthopedics;  Laterality: Right;  . PARTIAL HYSTERECTOMY  1994   still has ovaries, removed for fibroids  . TONSILLECTOMY N/A 09/18/2015   Procedure: TONSILLECTOMY;  Surgeon: Carloyn Manner, MD;  Location: [facility];  Service: ENT;  Laterality: N/A;  . TONSILLECTOMY    . TUBAL LIGATION    . UPPER GI ENDOSCOPY  2011    Family History  Problem Relation Age of Onset  . Cancer Mother        colon  . Tuberculosis Father   . Diabetes Father   . Heart disease Father        MI x 2  . Heart attack Father   . Diabetes Sister        half sister    SOCIAL HX: reports that she quit smoking about 36 years ago. Her smoking use included cigarettes. She has a 15.00 pack-year smoking history. She has never used smokeless tobacco. She reports previous alcohol use. She reports that she does not use drugs.  Current Outpatient Medications:  .  amLODipine (NORVASC) 10 MG tablet, TAKE 1 TABLET BY MOUTH EVERYDAY AT BEDTIME, Disp: 90 tablet, Rfl: 1 .  aspirin 81 MG chewable tablet, Chew 81 mg by mouth daily., Disp: , Rfl:  .  atorvastatin (LIPITOR) 20 MG tablet, TAKE 1 TABLET BY MOUTH EVERY DAY, Disp: 90 tablet, Rfl: 1 .  cholecalciferol (VITAMIN D) 1000 UNITS tablet, Take 1,000 Units by mouth daily. , Disp: , Rfl:  .  DULoxetine HCl 40 MG CPEP, TAKE 1 CAPSULE BY MOUTH EVERY DAY, Disp: 90 capsule, Rfl: 1 .  ferrous fumarate-iron polysaccharide complex (TANDEM) 162-115.2 MG CAPS capsule, Take 1 capsule by mouth daily with breakfast., Disp: 30 capsule, Rfl: 2 .  glipiZIDE (GLUCOTROL) 5 MG tablet, TAKE 1 TABLET (5 MG TOTAL) BY MOUTH 2 (TWO) TIMES DAILY BEFORE A MEAL., Disp: 180 tablet, Rfl: 1 .  meloxicam (MOBIC) 7.5 MG tablet, Take 7.5 mg by mouth 2 (two) times daily., Disp: , Rfl:  .  metFORMIN (GLUCOPHAGE) 1000 MG tablet, TAKE 1 TABLET BY MOUTH 2 TIMES DAILY WITH A MEAL., Disp: 180 tablet, Rfl: 1 .  omeprazole (PRILOSEC) 40 MG capsule, TAKE 1 CAPSULE BY MOUTH EVERY DAY, Disp:  90 capsule, Rfl: 3 .  ondansetron (ZOFRAN ODT) 4 MG disintegrating tablet, Take 1 tablet (4 mg total) by mouth every 8 (eight) hours as needed for nausea or vomiting., Disp: 20 tablet, Rfl: 3 .  traMADol (ULTRAM) 50 MG tablet, Take 1 tablet (50 mg total) by mouth every 6 (six) hours as needed for up to 7 days., Disp: 28 tablet, Rfl: 0 .  valsartan (DIOVAN) 40 MG tablet, Take 1 tablet (40 mg total) by mouth at bedtime., Disp: 90 tablet, Rfl: 1 .  zolpidem (AMBIEN) 5 MG tablet, TAKE 1 TABLET BY MOUTH EVERY DAY AT BEDTIME AS NEEDED, Disp: 15 tablet, Rfl: 5  EXAM:   General impression: alert, cooperative and articulate.  No signs of being in distress  Lungs:  speech is fluent sentence length suggests that patient is not short of breath and not punctuated by cough, sneezing or sniffing. [name]   Psych: affect normal.  speech is articulate and non pressured .  Denies suicidal thoughts   ASSESSMENT AND PLAN:  Discussed the following assessment and plan:  Essential hypertension  Acute meniscal tear, medial, right, sequela  Iron deficiency anemia, unspecified iron deficiency anemia type  Type 2 diabetes mellitus with nephropathy ([place])  Essential hypertension Readings have been elevated while taking meloxicam 7.5 mg bid.  Advised to suspend medication and continue amlodipine 10 mg and valsartan 40 mg daily   Acute meniscal tear, medial, right, sequela For surgical consult per workmen's comp.  Prescribing tramadol for pain control. Advised to dc meloxicam due to elevated blood pressure readings   Iron deficiency anemia Normal colonoscopy jan 2021.  Start iron supplements.    Type 2 diabetes mellitus with nephropathy ([facility]) Loss of control due to diet. She has improved her glycemic control with dietary changes.  Readings current 70 to 150 (post prandial).  Prior intolerance to Ozempic due to persistent nausea and vomiting even at lower dose. Vania Rea is not on formulary.  Continue glipizide 5 mg  bid and metformin 100 mg bid and recheck A1c Feb 11   Eye exam needed reminder given    I discussed the assessment and treatment plan with the patient. The patient was provided an opportunity to ask questions and all were answered. The patient agreed with the plan and demonstrated an understanding of the instructions.   The patient was advised to call back or seek an in-person evaluation if the symptoms worsen or if the condition fails to improve as anticipated.  Video connection was lost at < 50% of the duration of the visit ,  At which time the remainder of the visit was completed via audio only.   I provided  30 minutes of non-face-to-face time during this encounter reviewing patient's current problems and past surgeries, labs and imaging studies, providing counseling on the above mentioned problems , and coordination  of care .  Crecencio Mc, MD

## 2020-09-22 NOTE — Progress Notes (Signed)
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 Rechecked it on Thursday and it was 157/78 and Saturday was 134/80. Patient stated that she is taking both Amlodipine 10 mg in the morning and Valsartan 40 mg at night.

## 2020-09-22 NOTE — Assessment & Plan Note (Signed)
For surgical consult per workmen's comp.  Prescribing tramadol for pain control. Advised to dc meloxicam due to elevated blood pressure readings

## 2020-09-22 NOTE — Assessment & Plan Note (Signed)
Loss of control due to diet. She has improved her glycemic control with dietary changes.  Readings current 70 to 150 (post prandial).  Prior intolerance to Ozempic due to persistent nausea and vomiting even at lower dose. Cheryl Wallace is not on formulary.  Continue glipizide 5 mg bid and metformin 100 mg bid and recheck A1c Feb 11   Eye exam needed reminder given

## 2020-09-22 NOTE — Assessment & Plan Note (Signed)
Normal colonoscopy jan 2021.  Start iron supplements.

## 2020-10-11 ENCOUNTER — Other Ambulatory Visit: Payer: Self-pay | Admitting: Internal Medicine

## 2020-10-17 ENCOUNTER — Encounter: Payer: Self-pay | Admitting: Internal Medicine

## 2020-10-17 ENCOUNTER — Ambulatory Visit: Payer: BC Managed Care – PPO | Admitting: Internal Medicine

## 2020-10-17 ENCOUNTER — Other Ambulatory Visit: Payer: Self-pay

## 2020-10-17 VITALS — BP 148/80 | HR 91 | Temp 97.6°F | Ht 63.82 in | Wt 239.6 lb

## 2020-10-17 DIAGNOSIS — E1169 Type 2 diabetes mellitus with other specified complication: Secondary | ICD-10-CM

## 2020-10-17 DIAGNOSIS — E785 Hyperlipidemia, unspecified: Secondary | ICD-10-CM | POA: Diagnosis not present

## 2020-10-17 DIAGNOSIS — D509 Iron deficiency anemia, unspecified: Secondary | ICD-10-CM

## 2020-10-17 DIAGNOSIS — Z1231 Encounter for screening mammogram for malignant neoplasm of breast: Secondary | ICD-10-CM

## 2020-10-17 DIAGNOSIS — E1121 Type 2 diabetes mellitus with diabetic nephropathy: Secondary | ICD-10-CM | POA: Diagnosis not present

## 2020-10-17 DIAGNOSIS — S83241S Other tear of medial meniscus, current injury, right knee, sequela: Secondary | ICD-10-CM

## 2020-10-17 DIAGNOSIS — F320 Major depressive disorder, single episode, mild: Secondary | ICD-10-CM

## 2020-10-17 DIAGNOSIS — I10 Essential (primary) hypertension: Secondary | ICD-10-CM

## 2020-10-17 DIAGNOSIS — Z23 Encounter for immunization: Secondary | ICD-10-CM

## 2020-10-17 LAB — COMPREHENSIVE METABOLIC PANEL
ALT: 12 U/L (ref 0–35)
AST: 12 U/L (ref 0–37)
Albumin: 3.8 g/dL (ref 3.5–5.2)
Alkaline Phosphatase: 87 U/L (ref 39–117)
BUN: 19 mg/dL (ref 6–23)
CO2: 30 mEq/L (ref 19–32)
Calcium: 9 mg/dL (ref 8.4–10.5)
Chloride: 103 mEq/L (ref 96–112)
Creatinine, Ser: 0.83 mg/dL (ref 0.40–1.20)
GFR: 72.67 mL/min (ref >60.00)
Glucose, Bld: 154 mg/dL — ABNORMAL HIGH (ref 70–99)
Potassium: 4.4 mEq/L (ref 3.5–5.1)
Sodium: 140 mEq/L (ref 135–145)
Total Bilirubin: 0.5 mg/dL (ref 0.2–1.2)
Total Protein: 6.3 g/dL (ref 6.0–8.3)

## 2020-10-17 LAB — CBC WITH DIFFERENTIAL/PLATELET
Basophils Absolute: 0 10*3/uL (ref 0.0–0.1)
Basophils Relative: 0.7 % (ref 0.0–3.0)
Eosinophils Absolute: 0.2 10*3/uL (ref 0.0–0.7)
Eosinophils Relative: 4 % (ref 0.0–5.0)
HCT: 32.5 % — ABNORMAL LOW (ref 36.0–46.0)
Hemoglobin: 10.2 g/dL — ABNORMAL LOW (ref 12.0–15.0)
Lymphocytes Relative: 28.8 % (ref 12.0–46.0)
Lymphs Abs: 1.7 10*3/uL (ref 0.7–4.0)
MCHC: 31.3 g/dL (ref 30.0–36.0)
MCV: 74.6 fl — ABNORMAL LOW (ref 78.0–100.0)
Monocytes Absolute: 0.4 10*3/uL (ref 0.1–1.0)
Monocytes Relative: 7.3 % (ref 3.0–12.0)
Neutro Abs: 3.5 10*3/uL (ref 1.4–7.7)
Neutrophils Relative %: 59.2 % (ref 43.0–77.0)
Platelets: 303 10*3/uL (ref 150.0–400.0)
RBC: 4.35 Mil/uL (ref 3.87–5.11)
RDW: 16.9 % — ABNORMAL HIGH (ref 11.5–15.5)
WBC: 5.9 10*3/uL (ref 4.0–10.5)

## 2020-10-17 LAB — LIPID PANEL
Cholesterol: 110 mg/dL (ref 0–200)
HDL: 50.1 mg/dL (ref >39.00)
LDL Cholesterol: 43 mg/dL (ref 0–99)
NonHDL: 59.92
Total CHOL/HDL Ratio: 2
Triglycerides: 84 mg/dL (ref 0.0–149.0)
VLDL: 16.8 mg/dL (ref 0.0–40.0)

## 2020-10-17 MED ORDER — EMPAGLIFLOZIN 10 MG PO TABS: 10.0000 mg | ORAL_TABLET | Freq: Every day | ORAL | 0 refills | Status: DC

## 2020-10-17 MED ORDER — TRAMADOL HCL 50 MG PO TABS: 50.0000 mg | ORAL_TABLET | Freq: Four times a day (QID) | ORAL | 0 refills | Status: DC | PRN

## 2020-10-17 MED ORDER — VALSARTAN 40 MG PO TABS: 40.0000 mg | ORAL_TABLET | Freq: Every day | ORAL | 1 refills | Status: DC

## 2020-10-17 NOTE — Patient Instructions (Signed)
Measure home BP 3 times over the next 5 days.  If readings are  all > 140/80, double the valsartan dose    10 mg jardiance sennt to [facility]   Tramadol sent as well for nighttime pain

## 2020-10-17 NOTE — Progress Notes (Signed)
Subjective:  Patient ID: Cheryl Wallace, female    DOB: 1951-06-12  Age: 70 y.o. MRN: [id number]  CC: The primary encounter diagnosis was Breast cancer screening by mammogram. Diagnoses of Essential hypertension, Iron deficiency anemia, unspecified iron deficiency anemia type, Hyperlipidemia associated with type 2 diabetes mellitus ([place]), Type 2 diabetes mellitus with nephropathy ([place]), COVID-19 vaccine administered, Acute meniscal tear, medial, right, sequela, and Depression, major, single episode, mild ([place]) were also pertinent to this visit.  HPI Cheryl Wallace presents for follow up on hypertension,  Uncontrolled diabetes , and IDA .  This visit occurred during the SARS-CoV-2 public health emergency.  Safety protocols were in place, including screening questions prior to the visit, additional usage of staff PPE, and extensive cleaning of exam room while observing appropriate contact time as indicated for disinfecting solutions.   T2DM:  She  feels generally well,  But is not  exercising regularly due to persistent knee pain following a work related injury. She has been miserable and eating indiscriminately . She has been Checking blood sugars less than once daily at variable times, usually only if she feels she may be having a hypoglycemic event. .  BS have been under 15 fasting and < 180 post prandially.  Denies any recent hypoglyemic events.  Taking   medications as directed. Following a carbohydrate modified diet 6 days per week. Denies numbness, burning and tingling of extremities. Appetite is good. A change in medication, additional of Jardiance was  recommended after  last visit  But has not been authorized .  She is willing to start medication   IDA:  She has been taking Iron supplementation  Every other day   She failed the DOT physical but repeat BP was 130/70   Cc:  Shooting pain under right breast  , intermittent, usually occurring on a daily basis,   Not accompanied by nausea.  Has  history of gallstones  By 2014 T as well as fatty liver .    Saw orthopedics for workmen's comp injury  Of right knee with meniscal tear.   Surgery planned,  But is awaiting approval by insurance.  Will be  out of work for 3 weeks   Follow up on depression:  The patient feels generally well,  And  denies any feelings of hopelessness or anhedonia.  No panic attacks.  Patient denies any active or passive suicidal thoughts or psychosis.  Appetite is "too good."  Denies any marital conflict or other stressors contributing to anxiety    Outpatient Medications Prior to Visit  Medication Sig Dispense Refill  . amLODipine (NORVASC) 10 MG tablet TAKE 1 TABLET BY MOUTH EVERYDAY AT BEDTIME 90 tablet 1  . aspirin 81 MG chewable tablet Chew 81 mg by mouth daily.    [name] atorvastatin (LIPITOR) 20 MG tablet TAKE 1 TABLET BY MOUTH EVERY DAY 90 tablet 1  . cholecalciferol (VITAMIN D) 1000 UNITS tablet Take 1,000 Units by mouth daily.     . DULoxetine HCl 40 MG CPEP TAKE 1 CAPSULE BY MOUTH EVERY DAY 90 capsule 1  . ferrous fumarate-iron polysaccharide complex (TANDEM) 162-115.2 MG CAPS capsule Take 1 capsule by mouth daily with breakfast. 30 capsule 2  . fluticasone (FLONASE) 50 MCG/ACT nasal spray Place 2 sprays into both nostrils daily.    [name] glipiZIDE (GLUCOTROL) 5 MG tablet TAKE 1 TABLET (5 MG TOTAL) BY MOUTH 2 (TWO) TIMES DAILY BEFORE A MEAL. 180 tablet 1  . metFORMIN (GLUCOPHAGE) 1000 MG tablet TAKE  1 TABLET BY MOUTH 2 TIMES DAILY WITH A MEAL. 180 tablet 1  . omeprazole (PRILOSEC) 40 MG capsule TAKE 1 CAPSULE BY MOUTH EVERY DAY 90 capsule 3  . ondansetron (ZOFRAN ODT) 4 MG disintegrating tablet Take 1 tablet (4 mg total) by mouth every 8 (eight) hours as needed for nausea or vomiting. 20 tablet 3  . zolpidem (AMBIEN) 5 MG tablet TAKE 1 TABLET BY MOUTH EVERY DAY AT BEDTIME AS NEEDED 15 tablet 5  . meloxicam (MOBIC) 7.5 MG tablet Take 7.5 mg by mouth 2 (two) times daily.    . valsartan (DIOVAN) 40 MG tablet Take  1 tablet (40 mg total) by mouth at bedtime. 90 tablet 1   No facility-administered medications prior to visit.    Review of Systems;  Patient denies headache, fevers, malaise, unintentional weight loss, skin rash, eye pain, sinus congestion and sinus pain, sore throat, dysphagia,  hemoptysis , cough, dyspnea, wheezing, chest pain, palpitations, orthopnea, edema, abdominal pain, nausea, melena, diarrhea, constipation, flank pain, dysuria, hematuria, urinary  Frequency, nocturia, numbness, tingling, seizures,  Focal weakness, Loss of consciousness,  Tremor, insomnia, depression, anxiety, and suicidal ideation.      Objective:  BP (!) 148/80 (BP Location: Left Arm, Patient Position: Sitting)   Pulse 91   Temp 97.6 F (36.4 C)   Ht 5' 3.82" (1.621 m)   Wt 239 lb 9.6 oz (108.7 kg)   SpO2 97%   BMI 41.36 kg/m   BP Readings from Last 3 Encounters:  10/17/20 (!) 148/80  07/17/20 (!) 142/74  03/06/20 128/76    Wt Readings from Last 3 Encounters:  10/17/20 239 lb 9.6 oz (108.7 kg)  09/22/20 229 lb (103.9 kg)  07/17/20 229 lb 12.8 oz (104.2 kg)    General appearance: alert, cooperative and appears stated age Ears: normal TM's and external ear canals both ears Throat: lips, mucosa, and tongue normal; teeth and gums normal Neck: no adenopathy, no carotid bruit, supple, symmetrical, trachea midline and thyroid not enlarged, symmetric, no tenderness/mass/nodules Back: symmetric, no curvature. ROM normal. No CVA tenderness. Lungs: clear to auscultation bilaterally Heart: regular rate and rhythm, S1, S2 normal, no murmur, click, rub or gallop Abdomen: soft, non-tender to palpation ; bowel sounds normal; no masses,  no organomegaly Pulses: 2+ and symmetric Skin: Skin color, texture, turgor normal. No rashes or lesions Lymph nodes: Cervical, supraclavicular, and axillary nodes normal.  Lab Results  Component Value Date   HGBA1C 8.1 (H) 07/17/2020   HGBA1C 6.8 (H) 02/14/2020   HGBA1C  6.6 (A) 08/16/2019   HGBA1C 6.6 08/16/2019   HGBA1C 6.6 (A) 08/16/2019   HGBA1C 6.6 08/16/2019    Lab Results  Component Value Date   CREATININE 0.87 07/17/2020   CREATININE 0.80 03/06/2020   CREATININE 0.78 02/14/2020    Lab Results  Component Value Date   WBC 5.6 07/17/2020   HGB 10.7 (L) 07/17/2020   HCT 33.7 (L) 07/17/2020   PLT 252.0 07/17/2020   GLUCOSE 145 (H) 07/17/2020   CHOL 106 02/14/2020   TRIG 115.0 02/14/2020   HDL 41.50 02/14/2020   LDLDIRECT 52.0 09/26/2017   LDLCALC 42 02/14/2020   ALT 16 07/17/2020   AST 14 07/17/2020   NA 138 07/17/2020   K 4.2 07/17/2020   CL 103 07/17/2020   CREATININE 0.87 07/17/2020   BUN 18 07/17/2020   CO2 28 07/17/2020   TSH 1.10 07/17/2020   HGBA1C 8.1 (H) 07/17/2020   MICROALBUR 6.2 (H) 07/17/2020    MR  KNEE RIGHT WO CONTRAST  Result Date: 07/04/2020 CLINICAL DATA:  Anterior knee pain after injury 06/23/2020 EXAM: MRI OF THE RIGHT KNEE WITHOUT CONTRAST TECHNIQUE: Multiplanar, multisequence MR imaging of the knee was performed. No intravenous contrast was administered. COMPARISON:  X-ray 07/17/2018 FINDINGS: MENISCI Medial meniscus: Intrasubstance degeneration with complex undersurface tearing at the posterior horn-body junction (series 10, images 24-26). Lateral meniscus:  Intact with mild intrasubstance degeneration. LIGAMENTS Cruciates:  Intact ACL and PCL. Collaterals: Intact MCL. There is a multilobulated cystic structure overlying the superficial aspect of the MCL measuring 2.3 x 0.5 x 1.8 cm, likely ganglion cyst (series 11, image 16). Lateral collateral ligament complex intact. CARTILAGE Patellofemoral: Full-thickness chondral loss of the medial patellar facet with underlying subchondral marrow signal changes. Partial-thickness focal chondral fissure involving the lateral patellar facet (series 8, image 11). There are areas of full-thickness chondral loss within the trochlea. Medial: Chondral thinning and surface  irregularity involving the weight-bearing and nonweightbearing aspects of the medial compartment. Lateral: Tiny irregular fissuring of the central weight-bearing lateral femoral condyle. Joint: Small joint effusion. Numerous intra-articular loose bodies at the posterior joint line, largest measuring up to 11 mm (series 10, image 19). Fat pads within normal limits. Popliteal Fossa:  Small Baker's cyst.  Intact popliteus tendon. Extensor Mechanism:  Intact quadriceps tendon and patellar tendon. Bones: Mild tricompartmental joint space narrowing with small marginal osteophyte formation. Degenerative subchondral marrow signal changes within the patellofemoral compartment. No bone marrow edema or suspicious bone lesion. Other: Subtle intramuscular edema within the visualized gastrocnemius musculature, which may reflect muscle strain. IMPRESSION: 1. Degenerative undersurface tearing of the medial meniscus posterior horn-body junction. 2. Tricompartmental osteoarthritis, most advanced within the patellofemoral compartment. Small knee joint effusion with multiple intra-articular loose bodies at the posterior joint line, largest measuring up to 11 mm. 3. Small Baker's cyst. 4. Intact MCL with adjacent ganglion cyst measuring up to 2.3 cm. 5. Subtle intramuscular edema within the visualized gastrocnemius musculature, which may reflect muscle strain. Electronically Signed   By: Davina Poke D.O.   On: 07/04/2020 08:33    Assessment & Plan:   Problem List Items Addressed This Visit      Unprioritized   Acute meniscal tear, medial, right, sequela    MRI reviewed.  She is in constant pain and awaiting approval from [facility] for surgery.  Prescribing tramadol for pain control.       Depression, major, single episode, mild ([place])    The patient is being treated for depression and states that all symptoms remain minimally intrusive  with current regimen . The patient  denies suicidality and panic attacks.        Essential hypertension    Elevated today. she reports compliance with medication regimen  but has an elevated reading today in office.  She is not using NSAIDs daily.  Discussed goal of 120/70  (130/80 for patients over 70)  to preserve renal function.  She has been asked to check her  BP  at home and to double  her dose of valsartan if her systolic readings are > 606 at home .        Relevant Medications   valsartan (DIOVAN) 40 MG tablet   Hyperlipidemia associated with type 2 diabetes mellitus ([place])    LDL is at goal on atorvastatin and LFTS are normal   Lab Results  Component Value Date   CHOL 106 02/14/2020   HDL 41.50 02/14/2020   LDLCALC 42 02/14/2020   LDLDIRECT 52.0 09/26/2017  TRIG 115.0 02/14/2020   CHOLHDL 3 02/14/2020   Lab Results  Component Value Date   ALT 16 07/17/2020   AST 14 07/17/2020   ALKPHOS 96 07/17/2020   BILITOT 0.6 07/17/2020         Relevant Medications   empagliflozin (JARDIANCE) 10 MG TABS tablet   valsartan (DIOVAN) 40 MG tablet   Other Relevant Orders   Lipid panel   Iron deficiency anemia   Relevant Orders   Iron, TIBC and Ferritin Panel (Completed)   CBC with Differential/Platelet   Type 2 diabetes mellitus with nephropathy (HCC)    Loss of control due to dietary indiscretions.  She has had a failed trial of Ozempic due to persistent nausea and vomiting even at lower dose. Vania Rea is not on formulary, but PA will be sought.  Continue glipizide 5 mg bid and metformin 100 mg bid and recheck A1c Feb 11   Eye exam needed; reminder given  Lab Results  Component Value Date   HGBA1C 8.1 (H) 07/17/2020   Lab Results  Component Value Date   MICROALBUR 6.2 (H) 07/17/2020   MICROALBUR 3.5 (H) 10/11/2018           Relevant Medications   empagliflozin (JARDIANCE) 10 MG TABS tablet   valsartan (DIOVAN) 40 MG tablet   Other Relevant Orders   Comprehensive metabolic panel   Hemoglobin A1c    Other Visit Diagnoses    Breast  cancer screening by mammogram    -  Primary   Relevant Orders   MM DIGITAL SCREENING BILATERAL   COVID-19 vaccine administered       Relevant Orders   SARS-CoV-2 Semi-Quantitative Total Antibody, Spike      I provided  30 minutes of  face-to-face time during this encounter reviewing patient's current problems and past surgeries, labs and imaging studies, providing counseling on the above mentioned problems , and coordination  of care .  I have discontinued Nadina Fomby. Delarocha's meloxicam. I am also having her start on traMADol and empagliflozin. Additionally, I am having her maintain her cholecalciferol, ondansetron, aspirin, metFORMIN, DULoxetine HCl, amLODipine, omeprazole, zolpidem, atorvastatin, glipiZIDE, Tandem, fluticasone, and valsartan.  Meds ordered this encounter  Medications  . traMADol (ULTRAM) 50 MG tablet    Sig: Take 1 tablet (50 mg total) by mouth every 6 (six) hours as needed for up to 7 days.    Dispense:  28 tablet    Refill:  0  . empagliflozin (JARDIANCE) 10 MG TABS tablet    Sig: Take 1 tablet (10 mg total) by mouth daily.    Dispense:  30 tablet    Refill:  0  . valsartan (DIOVAN) 40 MG tablet    Sig: Take 1 tablet (40 mg total) by mouth at bedtime.    Dispense:  90 tablet    Refill:  1    Medications Discontinued During This Encounter  Medication Reason  . meloxicam (MOBIC) 7.5 MG tablet   . valsartan (DIOVAN) 40 MG tablet     Follow-up: Return in about 3 months (around 01/14/2021) for follow up diabetes.   Crecencio Mc, MD

## 2020-10-19 NOTE — Assessment & Plan Note (Addendum)
The patient is being treated for depression and states that all symptoms remain minimally intrusive  with current regimen . The patient  denies suicidality and panic attacks.

## 2020-10-19 NOTE — Assessment & Plan Note (Addendum)
Loss of control due to dietary indiscretions.  She has had a failed trial of Ozempic due to persistent nausea and vomiting even at lower dose. Vania Rea is not on formulary, but PA will be sought.  Continue glipizide 5 mg bid and metformin 100 mg bid and recheck A1c Feb 11   Eye exam needed; reminder given  Lab Results  Component Value Date   HGBA1C 8.1 (H) 07/17/2020   Lab Results  Component Value Date   MICROALBUR 6.2 (H) 07/17/2020   MICROALBUR 3.5 (H) 10/11/2018

## 2020-10-19 NOTE — Assessment & Plan Note (Signed)
MRI reviewed.  She is in constant pain and awaiting approval from [facility] for surgery.  Prescribing tramadol for pain control.

## 2020-10-19 NOTE — Assessment & Plan Note (Signed)
LDL is at goal on atorvastatin and LFTS are normal   Lab Results  Component Value Date   CHOL 106 02/14/2020   HDL 41.50 02/14/2020   LDLCALC 42 02/14/2020   LDLDIRECT 52.0 09/26/2017   TRIG 115.0 02/14/2020   CHOLHDL 3 02/14/2020   Lab Results  Component Value Date   ALT 16 07/17/2020   AST 14 07/17/2020   ALKPHOS 96 07/17/2020   BILITOT 0.6 07/17/2020

## 2020-10-19 NOTE — Assessment & Plan Note (Addendum)
Elevated today. she reports compliance with medication regimen  but has an elevated reading today in office.  She is not using NSAIDs daily.  Discussed goal of 120/70  (130/80 for patients over 70)  to preserve renal function.  She has been asked to check her  BP  at home and to double  her dose of valsartan if her systolic readings are > 146 at home .

## 2020-10-20 LAB — HEMOGLOBIN A1C: Hgb A1c MFr Bld: 7.4 % — ABNORMAL HIGH (ref 4.6–6.5)

## 2020-10-21 ENCOUNTER — Other Ambulatory Visit: Payer: Self-pay | Admitting: Internal Medicine

## 2020-10-21 DIAGNOSIS — D509 Iron deficiency anemia, unspecified: Secondary | ICD-10-CM

## 2020-10-21 LAB — IRON,TIBC AND FERRITIN PANEL
%SAT: 10 % (calc) — ABNORMAL LOW (ref 16–45)
Ferritin: 4 ng/mL — ABNORMAL LOW (ref 16–288)
Iron: 47 ug/dL (ref 45–160)
TIBC: 465 mcg/dL (calc) — ABNORMAL HIGH (ref 250–450)

## 2020-10-21 LAB — SARS-COV-2 SEMI-QUANTITATIVE TOTAL ANTIBODY, SPIKE: SARS COV2 AB, Total Spike Semi QN: 1610 U/mL — ABNORMAL HIGH (ref <0.8)

## 2020-10-22 ENCOUNTER — Other Ambulatory Visit: Payer: Self-pay | Admitting: Orthopedic Surgery

## 2020-10-25 ENCOUNTER — Other Ambulatory Visit: Payer: Self-pay | Admitting: Internal Medicine

## 2020-10-27 NOTE — Telephone Encounter (Signed)
RX Refill:ambien Last Seen:10-17-20 Last ordered:08-02-20

## 2020-11-06 ENCOUNTER — Encounter
Admission: RE | Admit: 2020-11-06 | Discharge: 2020-11-06 | Disposition: A | Discharge status: Home / Self Care | Payer: BC Managed Care – PPO | Source: Ambulatory Visit | Attending: Orthopedic Surgery | Admitting: Orthopedic Surgery

## 2020-11-06 ENCOUNTER — Other Ambulatory Visit: Payer: Self-pay

## 2020-11-06 DIAGNOSIS — E119 Type 2 diabetes mellitus without complications: Secondary | ICD-10-CM | POA: Insufficient documentation

## 2020-11-06 DIAGNOSIS — I10 Essential (primary) hypertension: Secondary | ICD-10-CM | POA: Insufficient documentation

## 2020-11-06 DIAGNOSIS — Z01818 Encounter for other preprocedural examination: Secondary | ICD-10-CM | POA: Diagnosis present

## 2020-11-06 HISTORY — DX: Chronic kidney disease, unspecified: N18.9

## 2020-11-06 HISTORY — DX: Anemia, unspecified: D64.9

## 2020-11-06 HISTORY — DX: Personal history of urinary calculi: Z87.442

## 2020-11-06 HISTORY — DX: Unspecified convulsions: R56.9

## 2020-11-06 HISTORY — DX: Unspecified osteoarthritis, unspecified site: M19.90

## 2020-11-06 NOTE — Patient Instructions (Addendum)
INSTRUCTIONS FOR SURGERY     Your surgery is scheduled for:   Tuesday, MARCH 15TH     To find out your arrival time for the day of surgery,          please call [phone] between 1 pm and 3 pm on :  Monday, MARCH 14TH     When you arrive for surgery, report to [facility]. ONCE DONE THERE, GO TO THE SECOND FLOOR AND SIGN IN        AT THE SURGICAL DESK.         Make sure that you and your driver have a mask when entering the hospital.        REMEMBER: Instructions that are not followed completely may result in serious medical risk,  up to and including death, or upon the discretion of your surgeon and anesthesiologist,            your surgery may need to be rescheduled.  __X__ 1. Do not eat food after midnight the night before your procedure.                    No gum, candy, lozenger, tic tacs, tums or hard candies.                  ABSOLUTELY NOTHING SOLID IN YOUR MOUTH AFTER MIDNIGHT                    You may drink unlimited clear liquids up to 2 hours before you are scheduled to arrive for surgery.                   Do not drink anything within those 2 hours unless you need to take medicine, then take the                   smallest amount you need.  Clear liquids include:  water, apple juice without pulp,                   any flavor Gatorade, Black coffee, black tea.  Sugar may be added but no dairy/ honey /lemon.                        Broth and jello is not considered a clear liquid.  __x__  2. On the morning of surgery, please brush your teeth with toothpaste and water. You may rinse with                  mouthwash if you wish but DO NOT SWALLOW TOOTHPASTE OR MOUTHWASH  __X___3. NO alcohol for 24 hours before or after surgery.  __x___ 4.  Do NOT smoke or use e-cigarettes for 24 HOURS PRIOR TO SURGERY.                      DO NOT Use any chewable tobacco products for at  least 6 hours prior to surgery.  __x___ 5. If you start any new medication after this appointment and prior to surgery,  please                   Bring it with you on the day of surgery.  ___x__ 6. Notify your doctor if there is any change in your medical condition, such as fever,                  infection, vomiting, diarrhea or any open sores.  __x___ 7.  USE the CHG SOAP as instructed, the night before surgery and the day of surgery.                   Once you have washed with this soap, do NOT use any of the following: Powders, perfumes                    or lotions. Please do not wear make up, hairpins, clips or nail polish. You MAY wear deodorant.                   Women need to shave 48 hours prior to surgery.                   DO NOT wear ANY jewelry on the day of surgery. If there are rings that are too tight to                    remove easily, please address this prior to the surgery day. Piercings need to be removed.                                                                     NO METAL ON YOUR BODY.                    Do NOT bring any valuables.  If you came to Pre-Admit testing then you will not need license,                     insurance card or credit card.  If you will be staying overnight, please either leave your things in                     the car or have your family be responsible for these items.                     [facility] IS NOT RESPONSIBLE FOR BELONGINGS OR VALUABLES.  ___X__ 8. DO NOT wear contact lenses on surgery day.  You may not have dentures,                     Hearing aides, contacts or glasses in the operating room. These items can be                    Placed in the Recovery Room to receive immediately after surgery.  __x___ 9. IF YOU ARE SCHEDULED TO GO HOME ON THE SAME DAY, YOU MUST                   Have someone to drive you home and to stay with you  for the first 24 hours.  Have an arrangement prior to arriving on surgery  day.  ___x__ 10. Take the following medications on the morning of surgery with a sip of water:                              1. AMLODIPINE/NORVASC                     2. OMEPRAZOLE (remember to take your night time dose the night before)                     3. FLONASE, if needed                     4. TRAMADOL, if needed                     __X__  12. STOP ASPIRIN AND ALL ASPIRIN PRODUCTS ONE WEEK PRIOR TO SURGERY.                       THIS INCLUDES BC POWDERS / GOODIES POWDER                           **STOP MARCH 8TH**  __x___ 13. STOP Anti-inflammatories as of ONE WEEK PRIOR TO SURGERY.                      This includes IBUPROFEN / MOTRIN / ADVIL / ALEVE/ NAPROXYN                           **STOP MARCH 8TH**                    YOU MAY TAKE TYLENOL ANY TIME PRIOR TO SURGERY.  _x____ 14. You may continue taking Vitamin D3 and TANDEM,                      but do not take on the morning of surgery.  __x____16.  Stop Metformin 2 full days prior to surgery.  Stop on: Saturday, MARCH 12TH                           (after your evening dose)                     Continue to take Glipizide and Jardiance up until surgery but .[name]                                                       Do NOT take any diabetes medications on surgery day.  __X____17.  Continue to take the following medications but do not take on the morning of surgery:                       JARDIANCE // GLIPIZIDE // TANDEM // VITAMIN D  ___X___18.   Wear clean and comfortable clothing to the hospital.                       Wear capris or loose fitting bottoms.  Have safe and sturdy shoes.  Make sure to bring your daughters cell phone number with you.  We provide updates via text.  If you are scheduled later in the afternoon, please make sure to drink clear liquids during day. Do NOT eat any sugar tablets if your sugar is dropping. Please drink ginger ale, sprite, etc.  Come to the [facility] and they will get you around  to [place] for your EKG on 11/14/20.

## 2020-11-09 ENCOUNTER — Other Ambulatory Visit: Payer: Self-pay | Admitting: Internal Medicine

## 2020-11-12 ENCOUNTER — Other Ambulatory Visit (INDEPENDENT_AMBULATORY_CARE_PROVIDER_SITE_OTHER): Payer: BC Managed Care – PPO

## 2020-11-12 DIAGNOSIS — D509 Iron deficiency anemia, unspecified: Secondary | ICD-10-CM

## 2020-11-12 LAB — FECAL OCCULT BLOOD, IMMUNOCHEMICAL: Fecal Occult Bld: NEGATIVE

## 2020-11-14 ENCOUNTER — Encounter
Admission: RE | Admit: 2020-11-14 | Discharge: 2020-11-14 | Disposition: A | Discharge status: Home / Self Care | Payer: BC Managed Care – PPO | Source: Ambulatory Visit | Attending: Orthopedic Surgery | Admitting: Orthopedic Surgery

## 2020-11-14 ENCOUNTER — Other Ambulatory Visit: Payer: Self-pay

## 2020-11-14 DIAGNOSIS — Z20822 Contact with and (suspected) exposure to covid-19: Secondary | ICD-10-CM | POA: Diagnosis not present

## 2020-11-14 DIAGNOSIS — I10 Essential (primary) hypertension: Secondary | ICD-10-CM | POA: Diagnosis not present

## 2020-11-14 DIAGNOSIS — E119 Type 2 diabetes mellitus without complications: Secondary | ICD-10-CM | POA: Insufficient documentation

## 2020-11-14 DIAGNOSIS — Z01818 Encounter for other preprocedural examination: Secondary | ICD-10-CM | POA: Insufficient documentation

## 2020-11-15 LAB — SARS CORONAVIRUS 2 (TAT 6-24 HRS): SARS Coronavirus 2: NEGATIVE

## 2020-11-17 MED ORDER — CHLORHEXIDINE GLUCONATE 0.12 % MT SOLN: 15.0000 mL | Freq: Once | OROMUCOSAL | Status: AC

## 2020-11-17 MED ORDER — SODIUM CHLORIDE 0.9 % IV SOLN: INTRAVENOUS | Status: DC

## 2020-11-17 MED ORDER — ORAL CARE MOUTH RINSE: 15.0000 mL | Freq: Once | OROMUCOSAL | Status: AC

## 2020-11-17 MED ORDER — CLINDAMYCIN PHOSPHATE 900 MG/50ML IV SOLN
900.0000 mg | INTRAVENOUS | Status: AC
  Administered 2020-11-18: 900 mg via INTRAVENOUS

## 2020-11-18 ENCOUNTER — Ambulatory Visit: Payer: BC Managed Care – PPO | Admitting: Anesthesiology

## 2020-11-18 ENCOUNTER — Ambulatory Visit
Admission: RE | Admit: 2020-11-18 | Discharge: 2020-11-18 | Disposition: A | Discharge status: Home / Self Care | Payer: BC Managed Care – PPO | Attending: Orthopedic Surgery | Admitting: Orthopedic Surgery

## 2020-11-18 ENCOUNTER — Encounter
Admission: RE | Disposition: A | Discharge status: Home / Self Care | Payer: Self-pay | Source: Home / Self Care | Attending: Orthopedic Surgery

## 2020-11-18 ENCOUNTER — Encounter: Payer: Self-pay | Admitting: Orthopedic Surgery

## 2020-11-18 ENCOUNTER — Other Ambulatory Visit: Payer: Self-pay

## 2020-11-18 DIAGNOSIS — Z880 Allergy status to penicillin: Secondary | ICD-10-CM | POA: Insufficient documentation

## 2020-11-18 DIAGNOSIS — Z79899 Other long term (current) drug therapy: Secondary | ICD-10-CM | POA: Diagnosis not present

## 2020-11-18 DIAGNOSIS — Z7984 Long term (current) use of oral hypoglycemic drugs: Secondary | ICD-10-CM | POA: Diagnosis not present

## 2020-11-18 DIAGNOSIS — X58XXXA Exposure to other specified factors, initial encounter: Secondary | ICD-10-CM | POA: Diagnosis not present

## 2020-11-18 DIAGNOSIS — Z7982 Long term (current) use of aspirin: Secondary | ICD-10-CM | POA: Diagnosis not present

## 2020-11-18 DIAGNOSIS — S83231A Complex tear of medial meniscus, current injury, right knee, initial encounter: Secondary | ICD-10-CM | POA: Insufficient documentation

## 2020-11-18 DIAGNOSIS — S83221A Peripheral tear of medial meniscus, current injury, right knee, initial encounter: Secondary | ICD-10-CM | POA: Insufficient documentation

## 2020-11-18 DIAGNOSIS — Z91013 Allergy to seafood: Secondary | ICD-10-CM | POA: Diagnosis not present

## 2020-11-18 HISTORY — PX: KNEE ARTHROSCOPY: SHX127

## 2020-11-18 LAB — GLUCOSE, CAPILLARY
Glucose-Capillary: 135 mg/dL — ABNORMAL HIGH (ref 70–99)
Glucose-Capillary: 125 mg/dL — ABNORMAL HIGH (ref 70–99)

## 2020-11-18 SURGERY — ARTHROSCOPY, KNEE
Anesthesia: General | Site: Knee | Laterality: Right

## 2020-11-18 MED ORDER — MIDAZOLAM HCL 2 MG/2ML IJ SOLN
INTRAMUSCULAR | Status: DC | PRN
  Administered 2020-11-18: 2 mg via INTRAVENOUS

## 2020-11-18 MED ORDER — HYDROCODONE-ACETAMINOPHEN 5-325 MG PO TABS: 1.0000 | ORAL_TABLET | Freq: Four times a day (QID) | ORAL | 0 refills | Status: DC | PRN

## 2020-11-18 MED ORDER — PROPOFOL 10 MG/ML IV BOLUS
INTRAVENOUS | Status: DC | PRN
  Administered 2020-11-18: 150 mg via INTRAVENOUS

## 2020-11-18 MED ORDER — PROPOFOL 10 MG/ML IV BOLUS
INTRAVENOUS | Status: AC
  Filled 2020-11-18: qty 60

## 2020-11-18 MED ORDER — DEXAMETHASONE SODIUM PHOSPHATE 10 MG/ML IJ SOLN
INTRAMUSCULAR | Status: DC | PRN
  Administered 2020-11-18: 10 mg via INTRAVENOUS

## 2020-11-18 MED ORDER — ACETAMINOPHEN 10 MG/ML IV SOLN
INTRAVENOUS | Status: DC | PRN
  Administered 2020-11-18: 1000 mg via INTRAVENOUS

## 2020-11-18 MED ORDER — MIDAZOLAM HCL 2 MG/2ML IJ SOLN
INTRAMUSCULAR | Status: AC
  Filled 2020-11-18: qty 2

## 2020-11-18 MED ORDER — CHLORHEXIDINE GLUCONATE 0.12 % MT SOLN
OROMUCOSAL | Status: AC
  Administered 2020-11-18: 15 mL via OROMUCOSAL
  Filled 2020-11-18: qty 15

## 2020-11-18 MED ORDER — BUPIVACAINE-EPINEPHRINE (PF) 0.5% -1:200000 IJ SOLN
INTRAMUSCULAR | Status: DC | PRN
  Administered 2020-11-18: 10 mL

## 2020-11-18 MED ORDER — BUPIVACAINE-EPINEPHRINE (PF) 0.5% -1:200000 IJ SOLN
INTRAMUSCULAR | Status: AC
  Filled 2020-11-18: qty 30

## 2020-11-18 MED ORDER — ACETAMINOPHEN 10 MG/ML IV SOLN
INTRAVENOUS | Status: AC
  Filled 2020-11-18: qty 100

## 2020-11-18 MED ORDER — FENTANYL CITRATE (PF) 100 MCG/2ML IJ SOLN
INTRAMUSCULAR | Status: AC
  Filled 2020-11-18: qty 2

## 2020-11-18 MED ORDER — ONDANSETRON HCL 4 MG/2ML IJ SOLN: 4.0000 mg | Freq: Once | INTRAMUSCULAR | Status: DC | PRN

## 2020-11-18 MED ORDER — LIDOCAINE HCL (CARDIAC) PF 100 MG/5ML IV SOSY
PREFILLED_SYRINGE | INTRAVENOUS | Status: DC | PRN
  Administered 2020-11-18: 100 mg via INTRAVENOUS

## 2020-11-18 MED ORDER — FENTANYL CITRATE (PF) 100 MCG/2ML IJ SOLN
25.0000 ug | INTRAMUSCULAR | Status: DC | PRN
  Administered 2020-11-18: 25 ug via INTRAVENOUS

## 2020-11-18 MED ORDER — CLINDAMYCIN PHOSPHATE 900 MG/50ML IV SOLN
INTRAVENOUS | Status: AC
  Filled 2020-11-18: qty 50

## 2020-11-18 MED ORDER — FENTANYL CITRATE (PF) 100 MCG/2ML IJ SOLN
INTRAMUSCULAR | Status: DC | PRN
  Administered 2020-11-18 (×2): 50 ug via INTRAVENOUS

## 2020-11-18 MED ORDER — FENTANYL CITRATE (PF) 100 MCG/2ML IJ SOLN
INTRAMUSCULAR | Status: AC
  Administered 2020-11-18: 25 ug via INTRAVENOUS
  Filled 2020-11-18: qty 2

## 2020-11-18 MED ORDER — ONDANSETRON HCL 4 MG/2ML IJ SOLN
INTRAMUSCULAR | Status: DC | PRN
  Administered 2020-11-18: 4 mg via INTRAVENOUS

## 2020-11-18 SURGICAL SUPPLY — 31 items
APL PRP STRL LF DISP 70% ISPRP (MISCELLANEOUS) ×1
BLADE INCISOR PLUS 4.5 (BLADE) IMPLANT
BNDG ELASTIC 4X5.8 VLCR STR LF (GAUZE/BANDAGES/DRESSINGS) IMPLANT
CHLORAPREP W/TINT 26 (MISCELLANEOUS) ×2 IMPLANT
COVER WAND RF STERILE (DRAPES) ×2 IMPLANT
CUFF TOURN SGL QUICK 24 (TOURNIQUET CUFF)
CUFF TOURN SGL QUICK 30 (TOURNIQUET CUFF)
CUFF TRNQT CYL 24X4X16.5-23 (TOURNIQUET CUFF) IMPLANT
CUFF TRNQT CYL 30X4X21-28X (TOURNIQUET CUFF) IMPLANT
DRAPE C-ARMOR (DRAPES) IMPLANT
GAUZE SPONGE 4X4 12PLY STRL (GAUZE/BANDAGES/DRESSINGS) ×2 IMPLANT
GLOVE SURG SYN 9.0  PF PI (GLOVE) ×1
GLOVE SURG SYN 9.0 PF PI (GLOVE) ×1 IMPLANT
GOWN SRG 2XL LVL 4 RGLN SLV (GOWNS) ×1 IMPLANT
GOWN STRL NON-REIN 2XL LVL4 (GOWNS) ×2
GOWN STRL REUS W/ TWL LRG LVL3 (GOWN DISPOSABLE) ×2 IMPLANT
GOWN STRL REUS W/TWL LRG LVL3 (GOWN DISPOSABLE) ×4
IV LACTATED RINGER IRRG 3000ML (IV SOLUTION) ×4
IV LR IRRIG 3000ML ARTHROMATIC (IV SOLUTION) ×2 IMPLANT
KIT TURNOVER KIT A (KITS) ×2 IMPLANT
MANIFOLD NEPTUNE II (INSTRUMENTS) ×4 IMPLANT
NEEDLE HYPO 22GX1.5 SAFETY (NEEDLE) ×2 IMPLANT
PACK ARTHROSCOPY KNEE (MISCELLANEOUS) ×2 IMPLANT
SCALPEL PROTECTED #11 DISP (BLADE) ×2 IMPLANT
SET TUBE SUCT SHAVER OUTFL 24K (TUBING) ×2 IMPLANT
SET TUBE TIP INTRA-ARTICULAR (MISCELLANEOUS) ×2 IMPLANT
SUT ETHILON 4-0 (SUTURE) ×2
SUT ETHILON 4-0 FS2 18XMFL BLK (SUTURE) ×1
SUTURE ETHLN 4-0 FS2 18XMF BLK (SUTURE) ×1 IMPLANT
TUBING ARTHRO INFLOW-ONLY STRL (TUBING) ×2 IMPLANT
WAND COBLATION FLOW 50 (SURGICAL WAND) ×2 IMPLANT

## 2020-11-18 NOTE — Op Note (Signed)
11/18/2020  3:44 PM  PATIENT:  Cheryl Wallace  70 y.o. female  PRE-OPERATIVE DIAGNOSIS:  Peripheral tear of medial meniscus of right knee as current injury, subsequent encounter S83.221D  POST-OPERATIVE DIAGNOSIS:  Peripheral tear of medial meniscus of right knee as current   PROCEDURE:  Procedure(s): Right knee arthroscopy, partial medial meniscectomy (Right)  SURGEON: Laurene Footman, MD  ASSISTANTS: None  ANESTHESIA:   general  EBL:  Total I/O In: 150 [IV Piggyback:150] Out: -   BLOOD ADMINISTERED:none  DRAINS: none   LOCAL MEDICATIONS USED:  MARCAINE     SPECIMEN:  No Specimen  DISPOSITION OF SPECIMEN:  N/A  COUNTS:  YES  TOURNIQUET tourniquet applied but not inflated  IMPLANTS: None  DICTATION: .Dragon Dictation patient was brought the operating room and after adequate general anesthesia was obtained the right leg was placed in the arthroscopic leg holder with tourniquet applied the upper thigh.  After prepping and draping in the usual sterile fashion appropriate patient identification and timeout procedures were completed.  An inferior lateral portal was made and the arthroscope introduced.  Initial inspection revealed a great deal of floating debris but no loose bodies in the suprapatellar pouch advanced patellofemoral degenerative change with approximately half the patella having loss of most of his cartilage without exposed bone as the superior half.  Trochlea also had areas of extensive cartilage loss.  The gutters were checked and there were no loose bodies coming around the medial compartment and inferior medial portal was made and inspection revealed loss of most of the articular cartilage to the weightbearing area of the femoral condyle with just fibrillation of the cartilage left very little smooth cartilage.  On probing there is a complex tear of the posterior third with a horizontal and vertical component this was debrided with use of a meniscal punch followed by  shaver and then ArthroCare wand to a stable margin removing most of the posterior third.  Probing showed this was stable.  The notch was examined and the ACL was stable with good fiber bundle definition.  Lateral compartment was nearly normal with just some very slight grade 1 changes to the femoral and tibial condyles meniscus was intact.  After having addressed the medial compartment the knee was thoroughly irrigated till clear and all instrumentation withdrawn.  Wounds were closed with simple interrupted 4-0 nylon and a total of 20 cc half percent Sensorcaine with epinephrine infiltrated in the area of the portals.  Dressing of Xeroform 4 x 4 web roll and Ace wrap applied.  PLAN OF CARE: Discharge to home after PACU  PATIENT DISPOSITION:  PACU - hemodynamically stable.

## 2020-11-18 NOTE — Anesthesia Postprocedure Evaluation (Signed)
Anesthesia Post Note  Patient: Cheryl Wallace  Procedure(s) Performed: Right knee arthroscopy, partial medial meniscectomy (Right Knee)  Patient location during evaluation: PACU Anesthesia Type: General Level of consciousness: awake and alert Pain management: pain level controlled Vital Signs Assessment: post-procedure vital signs reviewed and stable Respiratory status: spontaneous breathing and respiratory function stable Cardiovascular status: stable Anesthetic complications: no   No complications documented.   Last Vitals:  Vitals:   11/18/20 1257 11/18/20 1539  BP:  (!) 148/77  Pulse: 83 81  Resp: 20 14  Temp: 36.5 C 36.8 C  SpO2: 98% 99%    Last Pain:  Vitals:   11/18/20 1539  TempSrc:   PainSc: Asleep                 KEPHART,WILLIAM K

## 2020-11-18 NOTE — Anesthesia Preprocedure Evaluation (Addendum)
Anesthesia Evaluation  Patient identified by MRN, date of birth, ID band Patient awake    History of Anesthesia Complications (+) history of anesthetic complications  Airway Mallampati: III       Dental   Pulmonary neg sleep apnea, neg COPD, Not current smoker, former smoker,           Cardiovascular hypertension, Pt. on medications (-) Past MI and (-) CHF (-) dysrhythmias (-) Valvular Problems/Murmurs     Neuro/Psych Seizures - (as a child, no meds),  Depression    GI/Hepatic Neg liver ROS, GERD  Medicated and Controlled,  Endo/Other  diabetes, Type 2, Oral Hypoglycemic Agents  Renal/GU Renal InsufficiencyRenal disease     Musculoskeletal   Abdominal   Peds  Hematology  (+) anemia ,   Anesthesia Other Findings   Reproductive/Obstetrics                            Anesthesia Physical Anesthesia Plan  ASA: III  Anesthesia Plan: General   Post-op Pain Management:    Induction: Intravenous  PONV Risk Score and Plan: 3 and Ondansetron and Dexamethasone  Airway Management Planned: LMA  Additional Equipment:   Intra-op Plan:   Post-operative Plan:   Informed Consent: I have reviewed the patients History and Physical, chart, labs and discussed the procedure including the risks, benefits and alternatives for the proposed anesthesia with the patient or authorized representative who has indicated his/her understanding and acceptance.       Plan Discussed with:   Anesthesia Plan Comments:         Anesthesia Quick Evaluation

## 2020-11-18 NOTE — Transfer of Care (Signed)
Immediate Anesthesia Transfer of Care Note  Patient: Cheryl Wallace  Procedure(s) Performed: Right knee arthroscopy, partial medial meniscectomy (Right Knee)  Patient Location: PACU  Anesthesia Type:General  Level of Consciousness: awake, alert  and oriented  Airway & Oxygen Therapy: Patient Spontanous Breathing and Patient connected to face mask oxygen  Post-op Assessment: Report given to RN and Post -op Vital signs reviewed and stable  Post vital signs: Reviewed and stable  Last Vitals:  Vitals Value Taken Time  BP 148/77 11/18/20 1538  Temp    Pulse 77 11/18/20 1541  Resp 15 11/18/20 1541  SpO2 99 % 11/18/20 1541  Vitals shown include unvalidated device data.  Last Pain:  Vitals:   11/18/20 1257  TempSrc: Temporal         Complications: No complications documented.

## 2020-11-18 NOTE — H&P (Signed)
Chief Complaint  Patient presents with  . Follow-up  H&P knee    History of the Present Illness: Cheryl Wallace is a 70 y.o. female here today for history and physical for right knee arthroscopy for partial medial meniscectomy with Dr. Hessie Knows on 11/18/2020. She has a right medial meniscus tear with a 2 cm parameniscal cyst, as well as some medial and patellofemoral arthritis. Patient has failed nonoperative treatment consisting of cortisone injection, bracing. She has had symptoms of right knee effusion. She is also had a small avulsion off the superior aspect of the pole the patella which has healed but is continuing to cause her some pain. Patient's MRI at [facility] showed medial meniscus tear. She does have some pain and swelling throughout the knee along the medial and posterior aspect of the knee.  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  I have reviewed past medical, surgical, social and family history, and allergies as documented in the EMR.  Past Medical History: Past Medical History:  Diagnosis Date  . Depression  . Diabetes mellitus type 2, uncomplicated (CMS-HCC)  . GERD (gastroesophageal reflux disease)  . Hyperlipidemia  . Hypertension  . Insomnia  . Polyarthralgia, unspecified   Past Surgical History: Past Surgical History:  Procedure Laterality Date  . COLONOSCOPY 05/12/2007, 12/27/2002  Dr. Kennis Carina @ [place] - PHPolyp(73mm), FHCC(m), rpt 5 yrs per RTE  . COLONOSCOPY 02/25/2020  Normal colon/FHx CC/Repeat 36yrs/TKT  . EGD 05/12/2007  Dr. Kennis Carina @ [facility]  . elbow surgery  . HYSTERECTOMY  . OOPHORECTOMY   Past Family History: Family History  Problem Relation Age of Onset  . Cancer Mother  . Colon cancer Mother  . Diabetes type II Father   Medications: Current Outpatient Medications Ordered in Epic  Medication Sig Dispense Refill  . amLODIPine (NORVASC) 5 MG tablet Take 5 mg by mouth once daily TAKE 1  TABLET BY MOUTH EVERY DAY  . aspirin 81 MG chewable tablet Take 81 mg by mouth once daily.  [name] atorvastatin (LIPITOR) 20 MG tablet Take 1 tablet by mouth once daily. 0  . cholecalciferol (VITAMIN D3) 1,000 unit tablet Take by mouth.  . DULoxetine (CYMBALTA) 20 MG DR capsule Take 20 mg by mouth once daily  . empagliflozin (JARDIANCE) 10 mg tablet Take 1 tablet by mouth once daily  . ferrous fumarate-iron polysaccharide complex (TANDEM DUAL ACTION) 162-115.2 (106) mg Cap per capsule Take 1 capsule by mouth daily with breakfast  . fluticasone propionate (FLONASE) 50 mcg/actuation nasal spray Place 1 spray into both nostrils 2 (two) times daily 16 g 0  . glipiZIDE (GLUCOTROL) 5 MG tablet  . meloxicam (MOBIC) 7.5 MG tablet Take 7.5 mg by mouth 2 (two) times daily  . metFORMIN (GLUCOPHAGE) 1000 MG tablet TAKE 1 TABLET BY MOUTH 2 TIMES DAILY WITH A MEAL.  [name] omeprazole (PRILOSEC) 40 MG DR capsule Take 1 capsule by mouth once daily  . valsartan (DIOVAN) 40 MG tablet Take by mouth Take 1 tablet (40 mg total) by mouth at bedtime  . zolpidem (AMBIEN) 5 MG tablet  . celecoxib (CELEBREX) 200 MG capsule Take 200 mg by mouth once daily (Patient not taking: Reported on 11/12/2020 )  . ondansetron (ZOFRAN-ODT) 4 MG disintegrating tablet Take by mouth (Patient not taking: Reported on 11/12/2020 )  . traMADoL (ULTRAM) 50 mg tablet Take 1 tablet by mouth once daily (Patient not taking: Reported on 11/12/2020 )  No current Epic-ordered facility-administered medications on file.   Allergies: Allergies  Allergen Reactions  . Lisinopril Other (See Comments) and Cough  cough  . Shellfish Containing Products Swelling  Face and hands swelling Other reaction(s): Swelling Swelling of face, lips and hands. No difficulty breathing.  [name] Penicillin Rash  . Other Swelling  Swelling of face, lips and hands. No difficulty breathing.  [name] Penicillins Rash    Body mass index is 39.21 kg/m.  Review of Systems: A  comprehensive 14 point ROS was performed, reviewed, and the pertinent orthopaedic findings are documented in the HPI.  Vitals:  11/12/20 1546  BP: 136/82    General Physical Examination:  General:  Well developed, well nourished, no apparent distress, normal affect, minimal antalgic gait with no assistive ice.  HEENT: Head normocephalic, atraumatic, PERRL.   Abdomen: Soft, non tender, non distended, Bowel sounds present.  Heart: Examination of the heart reveals regular, rate, and rhythm. There is no murmur noted on ascultation. There is a normal apical pulse.  Lungs: Lungs are clear to auscultation. There is no wheeze, rhonchi, or crackles. There is normal expansion of bilateral chest walls.   Musculoskeletal Examination:  On exam, right knee has good range of motion. Baker cyst to the posterior aspect of the right knee. Pain with right leg raise. Positive medial McMurray's test. No swelling or edema throughout the right leg.  Radiographs:  HISTORY: Unspecified injury of unspecified quadriceps muscle, fascia and tendon, initial encounter   TECHNIQUE: MRI of the right knee without contrast.   COMPARISON: None.   FINDINGS:  Bones: No acute fracture or osteonecrosis.  Cartilage: Grade 3 chondromalacia along the weightbearing surfaces of the medial compartment. Grade II chondromalacia in the lateral compartment. Patchy grade IV chondromalacia in the patellofemoral compartment..  Joint: Small joint effusion. Mostly posterior joint recess intra-articular bodies, the largest measuring 1.1 cm.  ACL: Intact.  PCL: Intact.  MCL: Intact.  LCL complex: Intact.  Medial meniscus: Intrasubstance signal abnormality in the posterior horn along the meniscocapsular junction. There is an adjacent cyst or bursal fluid collection measuring 2 cm.  Lateral meniscus: Intact.  Extensor mechanism: Intact. Moderate tendinosis.  Other: Trace Baker's cyst..  Assessment: ICD-10-CM  1. Peripheral  tear of medial meniscus of right knee as current injury, subsequent encounter S83.221D   Plan:  50. 70 year old female with physical exam, history and imaging findings consistent with medial meniscus tear. RIsks, benefits, complications of a right knee arthroscopy for partial medial meniscectomy have been discussed with the patient. Patient has agreed and consented procedure with Dr. Hessie Knows on 11/18/2020. We discussed postoperative course and time off of work. 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     Electronically signed by Feliberto Gottron, PA at 11/12/2020 4:42 PM EST  Back to top of Progress Notes  Doroteo Bradford, [place] - 11/12/2020 3:45 PM EST Formatting of this note might be different from the original. Review of Systems  Constitutional: Negative.  HENT: Negative.  Eyes: Negative.  Respiratory: Negative.  Cardiovascular: Negative.  Gastrointestinal: Negative.  Endocrine: Negative.  Genitourinary: Negative.  Musculoskeletal: Negative.  Skin: Negative.  Allergic/Immunologic: Negative.  Neurological: Negative.  Hematological: Negative.  Psychiatric/Behavioral: Negative.    Electronically signed by Loletta Parish, Joelene Millin, CMA at 11/12/2020 4:42 PM EST    Reviewed  H+P. No changes noted.

## 2020-11-18 NOTE — Discharge Instructions (Addendum)
Take it easy this week Weight bearing as tolerated Pain medicine as directed  May take tylenol 500mg  alternately every 4 hours with ibuprofen to prevent pain. No more than 4000mg  of tylenol in 24 hours.  Aspirin 325 daily  AMBULATORY SURGERY  DISCHARGE INSTRUCTIONS   1) The drugs that you were given will stay in your system until tomorrow so for the next 24 hours you should not:  A) Drive an automobile B) Make any legal decisions C) Drink any alcoholic beverage   2) You may resume regular meals tomorrow.  Today it is better to start with liquids and gradually work up to solid foods.  You may eat anything you prefer, but it is better to start with liquids, then soup and crackers, and gradually work up to solid foods.   3) Please notify your doctor immediately if you have any unusual bleeding, trouble breathing, redness and pain at the surgery site, drainage, fever, or pain not relieved by medication.    4) Additional Instructions:    Please contact your physician with any problems or Same Day Surgery at [phone], Monday through Friday 6 am to 4 pm, or [place] at [facility] number at [phone].

## 2020-11-18 NOTE — Anesthesia Procedure Notes (Signed)
Procedure Name: LMA Insertion Date/Time: 11/18/2020 3:01 PM Performed by: Nelda Marseille, CRNA Pre-anesthesia Checklist: Patient identified, Patient being monitored, Timeout performed, Emergency Drugs available and Suction available Patient Re-evaluated:Patient Re-evaluated prior to induction Oxygen Delivery Method: Circle system utilized Preoxygenation: Pre-oxygenation with 100% oxygen Induction Type: IV induction Ventilation: Mask ventilation without difficulty LMA: LMA inserted LMA Size: 4.0 Tube type: Oral Number of attempts: 1 Placement Confirmation: positive ETCO2 and breath sounds checked- equal and bilateral Tube secured with: Tape Dental Injury: Teeth and Oropharynx as per pre-operative assessment

## 2020-11-19 ENCOUNTER — Encounter: Payer: Self-pay | Admitting: Orthopedic Surgery

## 2020-12-04 ENCOUNTER — Other Ambulatory Visit: Payer: Self-pay | Admitting: Internal Medicine

## 2020-12-09 ENCOUNTER — Other Ambulatory Visit: Payer: Self-pay | Admitting: Internal Medicine

## 2020-12-15 ENCOUNTER — Other Ambulatory Visit: Payer: Self-pay | Admitting: Internal Medicine

## 2021-01-11 ENCOUNTER — Other Ambulatory Visit: Payer: Self-pay | Admitting: Internal Medicine

## 2021-01-15 ENCOUNTER — Ambulatory Visit: Payer: BC Managed Care – PPO | Admitting: Internal Medicine

## 2021-02-07 ENCOUNTER — Other Ambulatory Visit: Payer: Self-pay | Admitting: Internal Medicine

## 2021-02-15 ENCOUNTER — Other Ambulatory Visit: Payer: Self-pay | Admitting: Internal Medicine

## 2021-02-16 LAB — HM DIABETES EYE EXAM

## 2021-02-18 ENCOUNTER — Ambulatory Visit: Payer: BC Managed Care – PPO | Admitting: Internal Medicine

## 2021-03-05 ENCOUNTER — Other Ambulatory Visit: Payer: Self-pay | Admitting: Internal Medicine

## 2021-03-05 DIAGNOSIS — E785 Hyperlipidemia, unspecified: Secondary | ICD-10-CM

## 2021-03-05 DIAGNOSIS — E1169 Type 2 diabetes mellitus with other specified complication: Secondary | ICD-10-CM

## 2021-03-17 ENCOUNTER — Other Ambulatory Visit: Payer: Self-pay | Admitting: Internal Medicine

## 2021-03-24 ENCOUNTER — Other Ambulatory Visit: Payer: Self-pay

## 2021-03-24 ENCOUNTER — Ambulatory Visit: Payer: BC Managed Care – PPO | Admitting: Internal Medicine

## 2021-03-24 ENCOUNTER — Encounter: Payer: Self-pay | Admitting: Internal Medicine

## 2021-03-24 VITALS — BP 162/74 | HR 104 | Temp 96.0°F | Resp 16 | Ht 65.0 in | Wt 234.2 lb

## 2021-03-24 DIAGNOSIS — R944 Abnormal results of kidney function studies: Secondary | ICD-10-CM

## 2021-03-24 DIAGNOSIS — M19011 Primary osteoarthritis, right shoulder: Secondary | ICD-10-CM | POA: Diagnosis not present

## 2021-03-24 DIAGNOSIS — E785 Hyperlipidemia, unspecified: Secondary | ICD-10-CM

## 2021-03-24 DIAGNOSIS — E1121 Type 2 diabetes mellitus with diabetic nephropathy: Secondary | ICD-10-CM | POA: Diagnosis not present

## 2021-03-24 DIAGNOSIS — E1169 Type 2 diabetes mellitus with other specified complication: Secondary | ICD-10-CM

## 2021-03-24 DIAGNOSIS — Z6838 Body mass index (BMI) 38.0-38.9, adult: Secondary | ICD-10-CM

## 2021-03-24 DIAGNOSIS — I10 Essential (primary) hypertension: Secondary | ICD-10-CM

## 2021-03-24 MED ORDER — TIZANIDINE HCL 2 MG PO TABS: 2.0000 mg | ORAL_TABLET | Freq: Four times a day (QID) | ORAL | 2 refills | Status: DC | PRN

## 2021-03-24 MED ORDER — AMLODIPINE BESYLATE 10 MG PO TABS: ORAL_TABLET | ORAL | 1 refills | Status: DC

## 2021-03-24 NOTE — Patient Instructions (Addendum)
Check your BP ASAP and let me know readings  so I can adjust your medication Continue valsartan 40 mg and amlodipine 10 mg for now    For your shoulder,  I am adding tizanidine as  a muscle relaxer . [name]  The dose can be increased to 4 mg if needed

## 2021-03-24 NOTE — Progress Notes (Signed)
Subjective:  Patient ID: Cheryl Wallace, female    DOB: 19-Mar-1951  Age: 70 y.o. MRN: [id number]  CC: The primary encounter diagnosis was Hyperlipidemia associated with type 2 diabetes mellitus ([place]). Diagnoses of Type 2 diabetes mellitus with nephropathy ([place]), Arthritis of right acromioclavicular joint, Class 2 severe obesity due to excess calories with serious comorbidity and body mass index (BMI) of 38.0 to 38.9 in adult [facility]), and Essential hypertension were also pertinent to this visit.  HPI Cheryl Wallace presents follow up on multiple conditions, including obesity,  diabetes and hypertension   This visit occurred during the SARS-CoV-2 public health emergency.  Safety protocols were in place, including screening questions prior to the visit, additional usage of staff PPE, and extensive cleaning of exam room while observing appropriate contact time as indicated for disinfecting solutions.    Left shoulder pain  has been problematic since March when she had to use crutches for several weeks following knee surgery.  The shoulder pain is es. improving  with massage,  "arthritis" per orthopedics  but no MRI has been done .   Using salon pas with lidocaine 3 at a time!      Type 2 DM:  he  feels generally well,  But is not  exercising regularly or trying to lose weight due to left shoulder pain. . Checking  blood sugars less than once daily at variable times, taking   medications as directed. Following a carbohydrate modified diet 6 days per week. Denies numbness, burning and tingling of extremities. Appetite is good.  post prandials  have been generally in the 150  range. She is tolerating jardiance.  But price is now $44 month.. foot exam normal   Eye exam was done June 20 Woodard's son  Left ear feels like "something in there"  has been using a q tip.[name]  dried blood noted on exam  Mammogram overdue due to left shoulder limited ROM   Outpatient Medications Prior to Visit  Medication Sig  Dispense Refill   aspirin 81 MG chewable tablet Chew 81 mg by mouth daily.     atorvastatin (LIPITOR) 20 MG tablet TAKE 1 TABLET BY MOUTH EVERY DAY 90 tablet 1   cholecalciferol (VITAMIN D) 1000 UNITS tablet Take 1,000 Units by mouth daily.      DULoxetine HCl 40 MG CPEP TAKE 1 CAPSULE BY MOUTH EVERY DAY 90 capsule 1   fluticasone (FLONASE) 50 MCG/ACT nasal spray Place 2 sprays into both nostrils daily as needed for allergies.     JARDIANCE 10 MG TABS tablet TAKE 1 TABLET BY MOUTH EVERY DAY 90 tablet 1   metFORMIN (GLUCOPHAGE) 1000 MG tablet TAKE 1 TABLET BY MOUTH 2 TIMES DAILY WITH A MEAL. 180 tablet 1   omeprazole (PRILOSEC) 40 MG capsule TAKE 1 CAPSULE BY MOUTH EVERY DAY (Patient taking differently: Take 40 mg by mouth in the morning and at bedtime.) 90 capsule 3   traMADol (ULTRAM) 50 MG tablet Take 50 mg by mouth every 6 (six) hours as needed.     valsartan (DIOVAN) 40 MG tablet Take 1 tablet (40 mg total) by mouth at bedtime. 90 tablet 1   zolpidem (AMBIEN) 5 MG tablet Take 1 tablet (5 mg total) by mouth at bedtime as needed for sleep. 30 tablet 5   amLODipine (NORVASC) 10 MG tablet TAKE 1 TABLET BY MOUTH EVERYDAY AT BEDTIME 90 tablet 1   glipiZIDE (GLUCOTROL) 5 MG tablet TAKE 1 TABLET (5 MG TOTAL) BY MOUTH  2 (TWO) TIMES DAILY BEFORE A MEAL. 180 tablet 1   HYDROcodone-acetaminophen (NORCO) 5-325 MG tablet Take 1 tablet by mouth every 6 (six) hours as needed for moderate pain. 30 tablet 0   HYDROcodone-acetaminophen (NORCO) 5-325 MG tablet Take 1 tablet by mouth every 6 (six) hours as needed for moderate pain. 30 tablet 0   No facility-administered medications prior to visit.    Review of Systems;  Patient denies headache, fevers, malaise, unintentional weight loss, skin rash, eye pain, sinus congestion and sinus pain, sore throat, dysphagia,  hemoptysis , cough, dyspnea, wheezing, chest pain, palpitations, orthopnea, edema, abdominal pain, nausea, melena, diarrhea, constipation, flank  pain, dysuria, hematuria, urinary  Frequency, nocturia, numbness, tingling, seizures,  Focal weakness, Loss of consciousness,  Tremor, insomnia, depression, anxiety, and suicidal ideation.      Objective:  BP (!) 162/74 (BP Location: Left Arm, Patient Position: Sitting, Cuff Size: Large)   Pulse (!) 104   Temp (!) 96 F (35.6 C) (Temporal)   Resp 16   Ht 5\' 5"  (1.651 m)   Wt 234 lb 3.2 oz (106.2 kg)   SpO2 95%   BMI 38.97 kg/m   BP Readings from Last 3 Encounters:  03/24/21 (!) 162/74  11/18/20 (!) 135/57  10/17/20 (!) 148/80    Wt Readings from Last 3 Encounters:  03/24/21 234 lb 3.2 oz (106.2 kg)  11/06/20 235 lb (106.6 kg)  10/17/20 239 lb 9.6 oz (108.7 kg)    General appearance: alert, cooperative and appears stated age Ears: normal TM's and external ear canals both ears Throat: lips, mucosa, and tongue normal; teeth and gums normal Neck: no adenopathy, no carotid bruit, supple, symmetrical, trachea midline and thyroid not enlarged, symmetric, no tenderness/mass/nodules Back: symmetric, no curvature. ROM normal. No CVA tenderness. Lungs: clear to auscultation bilaterally Heart: regular rate and rhythm, S1, S2 normal, no murmur, click, rub or gallop Abdomen: soft, non-tender; bowel sounds normal; no masses,  no organomegaly Pulses: 2+ and symmetric Skin: Skin color, texture, turgor normal. No rashes or lesions Lymph nodes: Cervical, supraclavicular, and axillary nodes normal.  Lab Results  Component Value Date   HGBA1C 7.7 (H) 03/24/2021   HGBA1C 7.4 (H) 10/17/2020   HGBA1C 8.1 (H) 07/17/2020    Lab Results  Component Value Date   CREATININE 1.03 03/24/2021   CREATININE 0.83 10/17/2020   CREATININE 0.87 07/17/2020    Lab Results  Component Value Date   WBC 5.9 10/17/2020   HGB 10.2 (L) 10/17/2020   HCT 32.5 (L) 10/17/2020   PLT 303.0 10/17/2020   GLUCOSE 133 (H) 03/24/2021   CHOL 110 10/17/2020   TRIG 84.0 10/17/2020   HDL 50.10 10/17/2020    LDLDIRECT 52.0 09/26/2017   LDLCALC 43 10/17/2020   ALT 22 03/24/2021   AST 17 03/24/2021   NA 139 03/24/2021   K 4.1 03/24/2021   CL 102 03/24/2021   CREATININE 1.03 03/24/2021   BUN 14 03/24/2021   CO2 26 03/24/2021   TSH 1.10 07/17/2020   HGBA1C 7.7 (H) 03/24/2021   MICROALBUR 6.2 (H) 07/17/2020    No results found.  Assessment & Plan:   Problem List Items Addressed This Visit       Unprioritized   Acromioclavicular joint arthritis    Left shoulder,  Improving with massage.  Her therapist observed considerable spasm of the muscles.   Muscle relaxer prescribed today        Relevant Medications   tiZANidine (ZANAFLEX) 2 MG tablet   Essential  hypertension    Hers BP is not at goal currentlyn valsartan and amlodipine. .  Medications reviewed and compliance assessed via patient report.  Renal function and electolytes assessed.  Use of NSAIDs, oral decongestants and other medications/supplements reviewed.  She is not using NSAIDs daily.  Discussed goal of 120/70  (130/80 for patients over 70)  to preserve renal function.  She has been asked to check her  BP  at home and  submit readings for evaluation. Renal function, electrolytes and screen for proteinuria are all up to date  Lab Results  Component Value Date   CREATININE 1.03 03/24/2021   Lab Results  Component Value Date   MICROALBUR 6.2 (H) 07/17/2020   MICROALBUR 3.5 (H) 10/11/2018            Relevant Medications   amLODipine (NORVASC) 10 MG tablet   Hyperlipidemia associated with type 2 diabetes mellitus ([place]) - Primary   Relevant Medications   glipiZIDE (GLUCOTROL) 10 MG tablet   Obesity     I have addressed  BMI and recommended wt loss of 10% of body weight over the next 6 months using a low fat, low starch, high protein  fruit/vegetable based Mediterranean diet.  She is unable to exercise currently due to right knee injury and now Shoulder pain        Relevant Medications   glipiZIDE (GLUCOTROL) 10 MG  tablet   Type 2 diabetes mellitus with nephropathy ([place])    Continued loss of control.   She has had a failed trial of Ozempic due to persistent nausea and vomiting even at lower dose. She is tolerating Jardiance at the 10 mg dose .  Will increase dose to 25 mg daily if normoglycemic is achieved by increasing glipizide to 10 mg  bid and continue metformin 1000 mg bid .  Eye exam needed; reminder given  Lab Results  Component Value Date   HGBA1C 7.7 (H) 03/24/2021   Lab Results  Component Value Date   MICROALBUR 6.2 (H) 07/17/2020   MICROALBUR 3.5 (H) 10/11/2018           Relevant Medications   glipiZIDE (GLUCOTROL) 10 MG tablet   Other Relevant Orders   Hemoglobin A1c (Completed)   Comprehensive metabolic panel (Completed)    I have discontinued Cheryl Wallace's HYDROcodone-acetaminophen and HYDROcodone-acetaminophen. I have also changed her glipiZIDE. Additionally, I am having her start on tiZANidine. Lastly, I am having her maintain her cholecalciferol, aspirin, omeprazole, fluticasone, valsartan, traMADol, DULoxetine HCl, metFORMIN, zolpidem, atorvastatin, Jardiance, and amLODipine.  Meds ordered this encounter  Medications   amLODipine (NORVASC) 10 MG tablet    Sig: TAKE 1 TABLET BY MOUTH EVERYDAY AT BEDTIME    Dispense:  90 tablet    Refill:  1   tiZANidine (ZANAFLEX) 2 MG tablet    Sig: Take 1 tablet (2 mg total) by mouth every 6 (six) hours as needed for muscle spasms.    Dispense:  60 tablet    Refill:  2   glipiZIDE (GLUCOTROL) 10 MG tablet    Sig: Take 1 tablet (10 mg total) by mouth 2 (two) times daily before a meal.    Dispense:  60 tablet    Refill:  2    NOTE DOSE INCREASE    Medications Discontinued During This Encounter  Medication Reason   HYDROcodone-acetaminophen (NORCO) 5-325 MG tablet    HYDROcodone-acetaminophen (NORCO) 5-325 MG tablet    amLODipine (NORVASC) 10 MG tablet Reorder   glipiZIDE (GLUCOTROL) 5  MG tablet    I provided  30 minutes of   face-to-face time  on the day of this encounter reviewing patient's current problems and past surgeries, labs and imaging studies, providing counseling on diabetes, hypertension and shoulder pain , and coordination  of care .   Follow-up: Return in about 6 months (around 09/24/2021) for follow up diabetes.   Crecencio Mc, MD

## 2021-03-25 ENCOUNTER — Encounter: Payer: Self-pay | Admitting: Internal Medicine

## 2021-03-25 LAB — COMPREHENSIVE METABOLIC PANEL
ALT: 22 U/L (ref 0–35)
AST: 17 U/L (ref 0–37)
Albumin: 4 g/dL (ref 3.5–5.2)
Alkaline Phosphatase: 96 U/L (ref 39–117)
BUN: 14 mg/dL (ref 6–23)
CO2: 26 mEq/L (ref 19–32)
Calcium: 9.6 mg/dL (ref 8.4–10.5)
Chloride: 102 mEq/L (ref 96–112)
Creatinine, Ser: 1.03 mg/dL (ref 0.40–1.20)
GFR: 55.91 mL/min — ABNORMAL LOW (ref >60.00)
Glucose, Bld: 133 mg/dL — ABNORMAL HIGH (ref 70–99)
Potassium: 4.1 mEq/L (ref 3.5–5.1)
Sodium: 139 mEq/L (ref 135–145)
Total Bilirubin: 0.4 mg/dL (ref 0.2–1.2)
Total Protein: 6.5 g/dL (ref 6.0–8.3)

## 2021-03-25 LAB — HEMOGLOBIN A1C: Hgb A1c MFr Bld: 7.7 % — ABNORMAL HIGH (ref 4.6–6.5)

## 2021-03-25 MED ORDER — GLIPIZIDE 10 MG PO TABS: 10.0000 mg | ORAL_TABLET | Freq: Two times a day (BID) | ORAL | 2 refills | Status: DC

## 2021-03-25 NOTE — Assessment & Plan Note (Signed)
Left shoulder,  Improving with massage.  Her therapist observed considerable spasm of the muscles.   Muscle relaxer prescribed today

## 2021-03-25 NOTE — Assessment & Plan Note (Signed)
I have addressed  BMI and recommended wt loss of 10% of body weight over the next 6 months using a low fat, low starch, high protein  fruit/vegetable based Mediterranean diet.  She is unable to exercise currently due to right knee injury and now Shoulder pain

## 2021-03-25 NOTE — Assessment & Plan Note (Addendum)
Continued loss of control.   She has had a failed trial of Ozempic due to persistent nausea and vomiting even at lower dose. She is tolerating Jardiance at the 10 mg dose .  Will increase dose to 25 mg daily if normoglycemic is achieved by increasing glipizide to 10 mg  bid and continue metformin 1000 mg bid .  Eye exam needed; reminder given  Lab Results  Component Value Date   HGBA1C 7.7 (H) 03/24/2021   Lab Results  Component Value Date   MICROALBUR 6.2 (H) 07/17/2020   MICROALBUR 3.5 (H) 10/11/2018

## 2021-03-25 NOTE — Assessment & Plan Note (Addendum)
Hers BP is not at goal currentlyn valsartan and amlodipine. .  Medications reviewed and compliance assessed via patient report.  Renal function and electolytes assessed.  Use of NSAIDs, oral decongestants and other medications/supplements reviewed.  She is not using NSAIDs daily.  Discussed goal of 120/70  (130/80 for patients over 70)  to preserve renal function.  She has been asked to check her  BP  at home and  submit readings for evaluation. Renal function, electrolytes and screen for proteinuria are all up to date  Lab Results  Component Value Date   CREATININE 1.03 03/24/2021   Lab Results  Component Value Date   MICROALBUR 6.2 (H) 07/17/2020   MICROALBUR 3.5 (H) 10/11/2018

## 2021-03-26 DIAGNOSIS — R944 Abnormal results of kidney function studies: Secondary | ICD-10-CM | POA: Insufficient documentation

## 2021-03-26 NOTE — Addendum Note (Signed)
Addended by: Crecencio Mc on: 03/26/2021 03:20 PM   Modules accepted: Orders

## 2021-03-26 NOTE — Assessment & Plan Note (Signed)
Advised to Repeat in one week after suspending any NSAIDs

## 2021-03-29 MED ORDER — CARVEDILOL 3.125 MG PO TABS: 3.1250 mg | ORAL_TABLET | Freq: Two times a day (BID) | ORAL | 3 refills | Status: DC

## 2021-03-31 ENCOUNTER — Other Ambulatory Visit: Payer: Self-pay | Admitting: Internal Medicine

## 2021-04-02 ENCOUNTER — Other Ambulatory Visit: Payer: Self-pay

## 2021-04-02 ENCOUNTER — Other Ambulatory Visit (INDEPENDENT_AMBULATORY_CARE_PROVIDER_SITE_OTHER): Payer: BC Managed Care – PPO

## 2021-04-02 DIAGNOSIS — R944 Abnormal results of kidney function studies: Secondary | ICD-10-CM

## 2021-04-02 DIAGNOSIS — M7542 Impingement syndrome of left shoulder: Secondary | ICD-10-CM

## 2021-04-03 LAB — BASIC METABOLIC PANEL
BUN: 14 mg/dL (ref 6–23)
CO2: 25 mEq/L (ref 19–32)
Calcium: 9.4 mg/dL (ref 8.4–10.5)
Chloride: 102 mEq/L (ref 96–112)
Creatinine, Ser: 1.02 mg/dL (ref 0.40–1.20)
GFR: 56.56 mL/min — ABNORMAL LOW (ref >60.00)
Glucose, Bld: 267 mg/dL — ABNORMAL HIGH (ref 70–99)
Potassium: 3.8 mEq/L (ref 3.5–5.1)
Sodium: 139 mEq/L (ref 135–145)

## 2021-04-04 DIAGNOSIS — R944 Abnormal results of kidney function studies: Secondary | ICD-10-CM

## 2021-04-04 DIAGNOSIS — E1121 Type 2 diabetes mellitus with diabetic nephropathy: Secondary | ICD-10-CM

## 2021-04-04 DIAGNOSIS — N1831 Chronic kidney disease, stage 3a: Secondary | ICD-10-CM

## 2021-04-05 ENCOUNTER — Other Ambulatory Visit: Payer: Self-pay | Admitting: Internal Medicine

## 2021-04-05 DIAGNOSIS — G8929 Other chronic pain: Secondary | ICD-10-CM

## 2021-04-11 ENCOUNTER — Other Ambulatory Visit: Payer: Self-pay | Admitting: Internal Medicine

## 2021-04-14 ENCOUNTER — Ambulatory Visit
Admission: RE | Admit: 2021-04-14 | Discharge: 2021-04-14 | Disposition: A | Discharge status: Home / Self Care | Payer: BC Managed Care – PPO | Source: Ambulatory Visit | Attending: Internal Medicine | Admitting: Internal Medicine

## 2021-04-14 ENCOUNTER — Other Ambulatory Visit: Payer: Self-pay

## 2021-04-14 DIAGNOSIS — M25511 Pain in right shoulder: Secondary | ICD-10-CM | POA: Insufficient documentation

## 2021-04-14 DIAGNOSIS — G8929 Other chronic pain: Secondary | ICD-10-CM | POA: Insufficient documentation

## 2021-04-15 ENCOUNTER — Other Ambulatory Visit: Payer: Self-pay | Admitting: Internal Medicine

## 2021-04-15 DIAGNOSIS — M25511 Pain in right shoulder: Secondary | ICD-10-CM

## 2021-04-15 DIAGNOSIS — M75101 Unspecified rotator cuff tear or rupture of right shoulder, not specified as traumatic: Secondary | ICD-10-CM

## 2021-04-15 DIAGNOSIS — G8929 Other chronic pain: Secondary | ICD-10-CM

## 2021-04-24 ENCOUNTER — Other Ambulatory Visit: Payer: Self-pay | Admitting: Internal Medicine

## 2021-05-05 ENCOUNTER — Other Ambulatory Visit: Payer: Self-pay | Admitting: Internal Medicine

## 2021-05-08 ENCOUNTER — Other Ambulatory Visit: Payer: Self-pay | Admitting: Nephrology

## 2021-05-08 DIAGNOSIS — N1831 Chronic kidney disease, stage 3a: Secondary | ICD-10-CM

## 2021-05-08 DIAGNOSIS — E1122 Type 2 diabetes mellitus with diabetic chronic kidney disease: Secondary | ICD-10-CM

## 2021-05-21 ENCOUNTER — Ambulatory Visit
Admission: RE | Admit: 2021-05-21 | Discharge: 2021-05-21 | Disposition: A | Discharge status: Home / Self Care | Payer: BC Managed Care – PPO | Source: Ambulatory Visit | Attending: Nephrology | Admitting: Nephrology

## 2021-05-21 ENCOUNTER — Other Ambulatory Visit: Payer: Self-pay

## 2021-05-21 ENCOUNTER — Other Ambulatory Visit: Payer: Self-pay | Admitting: Internal Medicine

## 2021-05-21 DIAGNOSIS — E1122 Type 2 diabetes mellitus with diabetic chronic kidney disease: Secondary | ICD-10-CM | POA: Diagnosis present

## 2021-05-21 DIAGNOSIS — N1831 Chronic kidney disease, stage 3a: Secondary | ICD-10-CM | POA: Insufficient documentation

## 2021-06-02 ENCOUNTER — Other Ambulatory Visit: Payer: Self-pay | Admitting: Internal Medicine

## 2021-06-21 ENCOUNTER — Other Ambulatory Visit: Payer: Self-pay | Admitting: Internal Medicine

## 2021-06-24 ENCOUNTER — Inpatient Hospital Stay: Payer: BC Managed Care – PPO

## 2021-06-24 ENCOUNTER — Telehealth: Payer: Self-pay

## 2021-06-24 ENCOUNTER — Other Ambulatory Visit: Payer: Self-pay

## 2021-06-24 ENCOUNTER — Encounter: Payer: Self-pay | Admitting: Oncology

## 2021-06-24 ENCOUNTER — Ambulatory Visit: Payer: BC Managed Care – PPO | Admitting: Internal Medicine

## 2021-06-24 ENCOUNTER — Inpatient Hospital Stay: Payer: BC Managed Care – PPO | Attending: Oncology | Admitting: Oncology

## 2021-06-24 VITALS — BP 154/94 | HR 83 | Temp 98.6°F | Resp 17 | Wt 230.0 lb

## 2021-06-24 DIAGNOSIS — F17210 Nicotine dependence, cigarettes, uncomplicated: Secondary | ICD-10-CM | POA: Diagnosis not present

## 2021-06-24 DIAGNOSIS — Z87891 Personal history of nicotine dependence: Secondary | ICD-10-CM

## 2021-06-24 DIAGNOSIS — K648 Other hemorrhoids: Secondary | ICD-10-CM | POA: Insufficient documentation

## 2021-06-24 DIAGNOSIS — K219 Gastro-esophageal reflux disease without esophagitis: Secondary | ICD-10-CM | POA: Insufficient documentation

## 2021-06-24 DIAGNOSIS — D509 Iron deficiency anemia, unspecified: Secondary | ICD-10-CM | POA: Insufficient documentation

## 2021-06-24 DIAGNOSIS — N189 Chronic kidney disease, unspecified: Secondary | ICD-10-CM | POA: Diagnosis not present

## 2021-06-24 DIAGNOSIS — Z86010 Personal history of colonic polyps: Secondary | ICD-10-CM | POA: Diagnosis not present

## 2021-06-24 DIAGNOSIS — E118 Type 2 diabetes mellitus with unspecified complications: Secondary | ICD-10-CM | POA: Insufficient documentation

## 2021-06-24 DIAGNOSIS — I129 Hypertensive chronic kidney disease with stage 1 through stage 4 chronic kidney disease, or unspecified chronic kidney disease: Secondary | ICD-10-CM | POA: Insufficient documentation

## 2021-06-24 DIAGNOSIS — I10 Essential (primary) hypertension: Secondary | ICD-10-CM | POA: Diagnosis not present

## 2021-06-24 DIAGNOSIS — R5383 Other fatigue: Secondary | ICD-10-CM | POA: Diagnosis not present

## 2021-06-24 DIAGNOSIS — Z800 Family history of malignant neoplasm of digestive organs: Secondary | ICD-10-CM | POA: Diagnosis not present

## 2021-06-24 DIAGNOSIS — E1122 Type 2 diabetes mellitus with diabetic chronic kidney disease: Secondary | ICD-10-CM | POA: Insufficient documentation

## 2021-06-24 DIAGNOSIS — R5381 Other malaise: Secondary | ICD-10-CM | POA: Insufficient documentation

## 2021-06-24 LAB — IRON AND TIBC
Iron: 38 ug/dL (ref 28–170)
Saturation Ratios: 7 % — ABNORMAL LOW (ref 10.4–31.8)
TIBC: 568 ug/dL — ABNORMAL HIGH (ref 250–450)
UIBC: 530 ug/dL

## 2021-06-24 LAB — CBC WITH DIFFERENTIAL/PLATELET
Abs Immature Granulocytes: 0.04 10*3/uL (ref 0.00–0.07)
Basophils Absolute: 0 10*3/uL (ref 0.0–0.1)
Basophils Relative: 0 %
Eosinophils Absolute: 0.5 10*3/uL (ref 0.0–0.5)
Eosinophils Relative: 7 %
HCT: 35.6 % — ABNORMAL LOW (ref 36.0–46.0)
Hemoglobin: 10.4 g/dL — ABNORMAL LOW (ref 12.0–15.0)
Immature Granulocytes: 1 %
Lymphocytes Relative: 20 %
Lymphs Abs: 1.4 10*3/uL (ref 0.7–4.0)
MCH: 22.3 pg — ABNORMAL LOW (ref 26.0–34.0)
MCHC: 29.2 g/dL — ABNORMAL LOW (ref 30.0–36.0)
MCV: 76.4 fL — ABNORMAL LOW (ref 80.0–100.0)
Monocytes Absolute: 0.6 10*3/uL (ref 0.1–1.0)
Monocytes Relative: 8 %
Neutro Abs: 4.5 10*3/uL (ref 1.7–7.7)
Neutrophils Relative %: 64 %
Platelets: 306 10*3/uL (ref 150–400)
RBC: 4.66 MIL/uL (ref 3.87–5.11)
RDW: 17.2 % — ABNORMAL HIGH (ref 11.5–15.5)
WBC: 7.1 10*3/uL (ref 4.0–10.5)
nRBC: 0 % (ref 0.0–0.2)

## 2021-06-24 LAB — FOLATE: Folate: 6.3 ng/mL

## 2021-06-24 LAB — COMPREHENSIVE METABOLIC PANEL
ALT: 19 U/L (ref 0–44)
AST: 19 U/L (ref 15–41)
Albumin: 3.7 g/dL (ref 3.5–5.0)
Alkaline Phosphatase: 109 U/L (ref 38–126)
Anion gap: 7 (ref 5–15)
BUN: 13 mg/dL (ref 8–23)
CO2: 27 mmol/L (ref 22–32)
Calcium: 8.6 mg/dL — ABNORMAL LOW (ref 8.9–10.3)
Chloride: 101 mmol/L (ref 98–111)
Creatinine, Ser: 0.76 mg/dL (ref 0.44–1.00)
GFR, Estimated: >60 mL/min (ref >60)
Glucose, Bld: 173 mg/dL — ABNORMAL HIGH (ref 70–99)
Potassium: 3.2 mmol/L — ABNORMAL LOW (ref 3.5–5.1)
Sodium: 135 mmol/L (ref 135–145)
Total Bilirubin: 0.7 mg/dL (ref 0.3–1.2)
Total Protein: 7.4 g/dL (ref 6.5–8.1)

## 2021-06-24 LAB — RETICULOCYTES
Immature Retic Fract: 27 % — ABNORMAL HIGH (ref 2.3–15.9)
RBC.: 4.63 MIL/uL (ref 3.87–5.11)
Retic Count, Absolute: 88.4 K/uL (ref 19.0–186.0)
Retic Ct Pct: 1.9 % (ref 0.4–3.1)

## 2021-06-24 LAB — FERRITIN: Ferritin: 7 ng/mL — ABNORMAL LOW (ref 11–307)

## 2021-06-24 LAB — VITAMIN B12: Vitamin B-12: 531 pg/mL (ref 180–914)

## 2021-06-24 LAB — TSH: TSH: 1.272 u[IU]/mL (ref 0.350–4.500)

## 2021-06-24 NOTE — Progress Notes (Signed)
Hematology/Oncology Consult note [facility] [phone] [phone]  Patient Care Team: Crecencio Mc, MD as PCP - General (Internal Medicine) [name], MD (General Surgery)   Name of the patient: Cheryl Wallace  [id number]  10/23/1950    Reason for referral-microcytic anemia   Referring physician-Dr. Holley Raring  Date of visit: 06/24/21   History of presenting illness-patient is a 69 year old female with a past medical history significant for hypertension type 2 diabetes CKD and she has been referred to [place] for anemia.  Her most recent CBC from 10/3/2022Showed white count of 10, H&H of 10.3/34.1 with an MCV of 73.7 and a platelet count of 331.  Her prior CBC from September 2022 showed an H&H of 9.8/23.9.  Patient has had chronic microcytosis with evidence of iron deficiency in the past.  No recent iron studies checked.  Patient denies any blood loss in her stool or urine.  Denies any dark melanotic stools.  Family history positive for colon cancer in her mother in her 73s.  She has had a colonoscopy in June 2021 by Dr. Alice Reichert which showed nonbleeding internal hemorrhoids but otherwise normal.  She has a history of GERD and has had an EGD many years ago.  Denies any changes in her appetite or weight.  Denies any difficulty swallowing.  Denies any consistent use of NSAIDs.  ECOG PS- 1  Pain scale- 0   Review of systems- Review of Systems  Constitutional:  Positive for malaise/fatigue. Negative for chills, fever and weight loss.  HENT:  Negative for congestion, ear discharge and nosebleeds.   Eyes:  Negative for blurred vision.  Respiratory:  Negative for cough, hemoptysis, sputum production, shortness of breath and wheezing.   Cardiovascular:  Negative for chest pain, palpitations, orthopnea and claudication.  Gastrointestinal:  Negative for abdominal pain, blood in stool, constipation, diarrhea, heartburn, melena, nausea and vomiting.   Genitourinary:  Negative for dysuria, flank pain, frequency, hematuria and urgency.  Musculoskeletal:  Negative for back pain, joint pain and myalgias.  Skin:  Negative for rash.  Neurological:  Negative for dizziness, tingling, focal weakness, seizures, weakness and headaches.  Endo/Heme/Allergies:  Does not bruise/bleed easily.  Psychiatric/Behavioral:  Negative for depression and suicidal ideas. The patient does not have insomnia.    Allergies  Allergen Reactions   Shellfish Allergy Swelling    Swelling of face, lips and hands. No difficulty breathing. No problems with topical betadine   Lisinopril Cough    cough   Ozempic (0.25 Or 0.5 Mg-Dose) [Semaglutide(0.25 Or 0.5mg -Dos)] Nausea And Vomiting   Hctz [Hydrochlorothiazide]     Muscle cramps   Penicillins Rash    Patient Active Problem List   Diagnosis Date Noted   Decreased glomerular filtration rate (GFR) 03/26/2021   Right knee injury, sequela 07/17/2020   Acromioclavicular joint arthritis 02/17/2020   Chronic right shoulder pain 02/14/2020   Post-nasal drip 04/12/2019   Educated about COVID-19 virus infection 01/09/2019   Posterior right knee pain 07/12/2018   Iron deficiency anemia 04/07/2018   Hyperlipidemia associated with type 2 diabetes mellitus ([place]) 09/04/2016   Depression, major, single episode, mild ([place]) 09/04/2016   Osteoarthritis of both hands 05/14/2016   S/P hysterectomy with oophorectomy 10/14/2015   Insomnia 10/14/2015   Essential hypertension    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   Subacromial impingement of left shoulder 04/03/2014   Benign lipomatous neoplasm of skin and subcutaneous tissue of left arm 04/03/2014   History of renal calculi 03/21/2014  Type 2 diabetes mellitus with nephropathy ([place]) 03/21/2014   Obesity 12/31/2012   Polyarthralgia 12/30/2012   Family history of colon cancer 12/29/2012   Allergic rhinitis due to fungal spores 12/21/2011   Personal history of colonic polyps  12/27/2002     Past Medical History:  Diagnosis Date   Acute meniscal tear, medial, right, sequela 09/22/2020   Allergic rhinitis    Anemia    iron deficiency   Arthritis    osteoarthritis right knee   Chronic kidney disease    nephropathy d/t diabetes   Complication of anesthesia 1981   While pt was under anesthesia, pt had an asthema attack during BTL.   Depression, major, single episode, mild ([place]) 09/04/2016   Elbow tendonitis    a. bilat, s/p surgery.   Essential hypertension    Functional ovarian cysts 2011   a. s/p TAH (1994);  b. s/p oopherectomy (~2011)   Gallstones    GERD (gastroesophageal reflux disease)    History of kidney stones    Hyperlipidemia    Insomnia    Low grade fever    Obesity    Polyarthralgia    Seasonal allergies    Seizure ([place])    last episode at 14 years. nothing since. unknown etiology   Tinnitus    Type 2 diabetes mellitus ([place])    a.  Hemoglobin A1c in July 2016, 6.7.     Past Surgical History:  Procedure Laterality Date   ABDOMINAL HYSTERECTOMY  1994   COLONOSCOPY  September 2008   Non-bleeding internal hemorhoids identified.   COLONOSCOPY WITH PROPOFOL N/A 02/25/2020   Procedure: COLONOSCOPY WITH PROPOFOL;  Surgeon: Toledo, Benay Pike, MD;  Location: [facility];  Service: Gastroenterology;  Laterality: N/A;   ELBOW SURGERY Bilateral    d/t tendonitis   FRACTURE SURGERY Right    wrist/hand with plate   KNEE ARTHROSCOPY Right 11/18/2020   Procedure: Right knee arthroscopy, partial medial meniscectomy;  Surgeon: Hessie Knows, MD;  Location: [facility];  Service: Orthopedics;  Laterality: Right;   NASAL SINUS SURGERY  09/18/2015   Procedure: IMAGE GUIDED SINUS SURGERY, BILATERAL MAXILLARY BALLOON SINUPLASTY, BILATERAL FRONTAL BALLOON SINUPLASTY, RIGHT SPHENOID  SINUPLASTY, RIGHT CONCHABULLOSA RESECTION;  Surgeon: Carloyn Manner, MD;  Location: [facility];  Service: ENT;;   OOPHORECTOMY  04/2010   bilateral, 7 cm benign tumor left  ovary   OPEN REDUCTION INTERNAL FIXATION (ORIF) DISTAL RADIAL FRACTURE Right 02/07/2017   Procedure: OPEN REDUCTION INTERNAL FIXATION (ORIF) DISTAL RADIAL FRACTURE;  Surgeon: Hessie Knows, MD;  Location: [facility];  Service: Orthopedics;  Laterality: Right;   PARTIAL HYSTERECTOMY  1994   still has ovaries, removed for fibroids   TONSILLECTOMY N/A 09/18/2015   Procedure: TONSILLECTOMY;  Surgeon: Carloyn Manner, MD;  Location: [facility];  Service: ENT;  Laterality: N/A;   TONSILLECTOMY     TUBAL LIGATION     UPPER GI ENDOSCOPY  2011    Social History   Socioeconomic History   Marital status: Divorced    Spouse name: Not on file   Number of children: Not on file   Years of education: Not on file   Highest education level: Not on file  Occupational History   Occupation: school and bus driver    Comment: still working  Tobacco Use   Smoking status: Former    Packs/day: 1.50    Years: 10.00    Pack years: 15.00    Types: Cigarettes    Quit date: 09/05/1984    Years since  quitting: 36.8   Smokeless tobacco: Never   Tobacco comments:    remote, quit 30 years ago  Vaping Use   Vaping Use: Never used  Substance and Sexual Activity   Alcohol use: Not Currently    Alcohol/week: 0.0 standard drinks    Comment: on occasion, martinis maybe twice a year (Christmas & New Years).   Drug use: No   Sexual activity: Not Currently  Other Topics Concern   Not on file  Social History Narrative   Patient lives alone. Currently working. Daughter lives somewhat nearby.    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     Does not routinely exercise.  Always uses seat belts.  Has well water.   Social Determinants of Health   Financial Resource Strain: Not on file  Food Insecurity: Not on file  Transportation Needs: Not on file  Physical Activity: Not on file  Stress: Not on file  Social Connections: Not on file  Intimate Partner Violence: Not on file     Family History  Problem  Relation Age of Onset   Cancer Mother        colon   Tuberculosis Father    Diabetes Father    Heart disease Father        MI x 2   Heart attack Father    Diabetes Sister        half sister     Current Outpatient Medications:    acetaminophen (TYLENOL) 500 MG tablet, Take 500 mg by mouth every 6 (six) hours as needed., Disp: , Rfl:    amLODipine (NORVASC) 10 MG tablet, TAKE 1 TABLET BY MOUTH EVERYDAY AT BEDTIME, Disp: 90 tablet, Rfl: 1   aspirin 81 MG chewable tablet, Chew 81 mg by mouth daily., Disp: , Rfl:    atorvastatin (LIPITOR) 20 MG tablet, TAKE 1 TABLET BY MOUTH EVERY DAY, Disp: 90 tablet, Rfl: 1   Azelastine HCl 137 MCG/SPRAY SOLN, Place 1 spray into both nostrils 2 (two) times daily., Disp: , Rfl:    carvedilol (COREG) 3.125 MG tablet, TAKE 1 TABLET (3.125 MG TOTAL) BY MOUTH 2 (TWO) TIMES DAILY WITH A MEAL., Disp: 180 tablet, Rfl: 1   cholecalciferol (VITAMIN D) 1000 UNITS tablet, Take 1,000 Units by mouth daily. , Disp: , Rfl:    DULoxetine HCl 40 MG CPEP, Take by mouth., Disp: , Rfl:    fluticasone (FLONASE) 50 MCG/ACT nasal spray, Place into the nose., Disp: , Rfl:    glipiZIDE (GLUCOTROL) 10 MG tablet, Take 1 tablet (10 mg total) by mouth 2 (two) times daily before a meal., Disp: 60 tablet, Rfl: 2   JARDIANCE 10 MG TABS tablet, TAKE 1 TABLET BY MOUTH EVERY DAY, Disp: 90 tablet, Rfl: 1   metFORMIN (GLUCOPHAGE) 1000 MG tablet, TAKE 1 TABLET BY MOUTH 2 TIMES DAILY WITH A MEAL., Disp: 180 tablet, Rfl: 1   omeprazole (PRILOSEC) 40 MG capsule, Take by mouth., Disp: , Rfl:    tiZANidine (ZANAFLEX) 2 MG tablet, Take 1 tablet by mouth every 6 (six) hours as needed., Disp: , Rfl:    valsartan (DIOVAN) 40 MG tablet, TAKE 1 TABLET BY MOUTH EVERYDAY AT BEDTIME, Disp: 90 tablet, Rfl: 1   zolpidem (AMBIEN) 5 MG tablet, Take 1 tablet (5 mg total) by mouth at bedtime as needed for sleep., Disp: 30 tablet, Rfl: 5   doxycycline (VIBRAMYCIN) 100 MG capsule, Take 100 mg by mouth 2 (two) times  daily., Disp: , Rfl:  predniSONE (DELTASONE) 20 MG tablet, Take 20 mg by mouth daily. (Patient not taking: Reported on 06/24/2021), Disp: , Rfl:    promethazine-dextromethorphan (PROMETHAZINE-DM) 6.25-15 MG/5ML syrup, Take by mouth. (Patient not taking: Reported on 06/24/2021), Disp: , Rfl:    traMADol (ULTRAM) 50 MG tablet, Take 50 mg by mouth every 6 (six) hours as needed. (Patient not taking: Reported on 06/24/2021), Disp: , Rfl:    Physical exam:  Vitals:   06/24/21 1052  BP: (!) 154/94  Pulse: 83  Resp: 17  Temp: 98.6 F (37 C)  TempSrc: Tympanic  SpO2: 98%  Weight: 230 lb (104.3 kg)   Physical Exam Constitutional:      General: She is not in acute distress. Cardiovascular:     Rate and Rhythm: Normal rate and regular rhythm.     Heart sounds: Normal heart sounds.  Pulmonary:     Effort: Pulmonary effort is normal.     Breath sounds: Normal breath sounds.  Abdominal:     General: Bowel sounds are normal.     Palpations: Abdomen is soft.  Skin:    General: Skin is warm and dry.  Neurological:     Mental Status: She is alert and oriented to person, place, and time.       CMP Latest Ref Rng & Units 06/24/2021  Glucose 70 - 99 mg/dL 173(H)  BUN 8 - 23 mg/dL 13  Creatinine 0.44 - 1.00 mg/dL 0.76  Sodium 135 - 145 mmol/L 135  Potassium 3.5 - 5.1 mmol/L 3.2(L)  Chloride 98 - 111 mmol/L 101  CO2 22 - 32 mmol/L 27  Calcium 8.9 - 10.3 mg/dL 8.6(L)  Total Protein 6.5 - 8.1 g/dL 7.4  Total Bilirubin 0.3 - 1.2 mg/dL 0.7  Alkaline Phos 38 - 126 U/L 109  AST 15 - 41 U/L 19  ALT 0 - 44 U/L 19   CBC Latest Ref Rng & Units 06/24/2021  WBC 4.0 - 10.5 K/uL 7.1  Hemoglobin 12.0 - 15.0 g/dL 10.4(L)  Hematocrit 36.0 - 46.0 % 35.6(L)  Platelets 150 - 400 K/uL 306    Assessment and plan- Patient is a 70 y.o. female referred for microcytic anemia  Suspect anemia secondary to longstanding iron deficiency.  Today I will do a complete anemia work-up including a CBC with  differential CMP ferritin iron studies B12 folate myeloma panel reticulocyte count haptoglobin and TSH.  If she is found to be iron deficient she will proceed with IV iron.  Discussed risks and benefits of IV iron including all but not limited to possible risk of anaphylactic reaction.  Patient understands and agrees to proceed as planned.  Video visit with me in 2 weeks time.  We will also reach out to [facility] for consideration for endoscopic evaluation given evidence of microcytic iron deficiency anemia   Thank you for this kind referral and the opportunity to participate in the care of this patient   Visit Diagnosis 1. Microcytic anemia     Dr. Randa Evens, MD, MPH [facility] [phone] 06/24/2021

## 2021-06-24 NOTE — Progress Notes (Signed)
Patient here for oncology follow-up appointment, concerns of fatigue, headaches, and constipation

## 2021-06-25 ENCOUNTER — Other Ambulatory Visit: Payer: Self-pay | Admitting: Internal Medicine

## 2021-06-25 ENCOUNTER — Other Ambulatory Visit: Payer: Self-pay | Admitting: Oncology

## 2021-06-25 LAB — HAPTOGLOBIN: Haptoglobin: 194 mg/dL (ref 37–355)

## 2021-06-26 ENCOUNTER — Other Ambulatory Visit: Payer: Self-pay

## 2021-06-26 ENCOUNTER — Inpatient Hospital Stay: Payer: BC Managed Care – PPO

## 2021-06-26 VITALS — BP 141/82 | HR 77 | Temp 98.2°F | Resp 16

## 2021-06-26 DIAGNOSIS — D508 Other iron deficiency anemias: Secondary | ICD-10-CM

## 2021-06-26 DIAGNOSIS — D509 Iron deficiency anemia, unspecified: Secondary | ICD-10-CM | POA: Diagnosis not present

## 2021-06-26 LAB — MULTIPLE MYELOMA PANEL, SERUM
Albumin SerPl Elph-Mcnc: 3.5 g/dL (ref 2.9–4.4)
Albumin/Glob SerPl: 1.3 (ref 0.7–1.7)
Alpha 1: 0.2 g/dL (ref 0.0–0.4)
Alpha2 Glob SerPl Elph-Mcnc: 0.8 g/dL (ref 0.4–1.0)
B-Globulin SerPl Elph-Mcnc: 1.1 g/dL (ref 0.7–1.3)
Gamma Glob SerPl Elph-Mcnc: 0.7 g/dL (ref 0.4–1.8)
Globulin, Total: 2.7 g/dL (ref 2.2–3.9)
IgA: 161 mg/dL (ref 87–352)
IgG (Immunoglobin G), Serum: 741 mg/dL (ref 586–1602)
IgM (Immunoglobulin M), Srm: 112 mg/dL (ref 26–217)
Total Protein ELP: 6.2 g/dL (ref 6.0–8.5)

## 2021-06-26 MED ORDER — SODIUM CHLORIDE 0.9 % IV SOLN: 200.0000 mg | INTRAVENOUS | Status: DC

## 2021-06-26 MED ORDER — SODIUM CHLORIDE 0.9 % IV SOLN
Freq: Once | INTRAVENOUS | Status: AC
  Filled 2021-06-26: qty 250

## 2021-06-26 MED ORDER — IRON SUCROSE 20 MG/ML IV SOLN
200.0000 mg | Freq: Once | INTRAVENOUS | Status: AC
  Administered 2021-06-26: 200 mg via INTRAVENOUS
  Filled 2021-06-26: qty 10

## 2021-06-26 NOTE — Patient Instructions (Signed)

## 2021-06-29 ENCOUNTER — Inpatient Hospital Stay: Payer: BC Managed Care – PPO

## 2021-06-29 ENCOUNTER — Encounter: Payer: Self-pay | Admitting: Oncology

## 2021-06-29 NOTE — Telephone Encounter (Signed)
New patient referral sent on 10/19 to [facility] requested by Dr.Rao for endoscopic evaluation of anemia diagnosis

## 2021-07-01 ENCOUNTER — Inpatient Hospital Stay: Payer: BC Managed Care – PPO

## 2021-07-03 ENCOUNTER — Inpatient Hospital Stay: Payer: BC Managed Care – PPO

## 2021-07-03 ENCOUNTER — Other Ambulatory Visit: Payer: Self-pay

## 2021-07-03 VITALS — BP 136/69 | HR 77 | Temp 96.4°F | Resp 20

## 2021-07-03 DIAGNOSIS — D508 Other iron deficiency anemias: Secondary | ICD-10-CM

## 2021-07-03 DIAGNOSIS — D509 Iron deficiency anemia, unspecified: Secondary | ICD-10-CM | POA: Diagnosis not present

## 2021-07-03 MED ORDER — SODIUM CHLORIDE 0.9 % IV SOLN: 200.0000 mg | INTRAVENOUS | Status: DC

## 2021-07-03 MED ORDER — SODIUM CHLORIDE 0.9 % IV SOLN
Freq: Once | INTRAVENOUS | Status: AC
  Filled 2021-07-03: qty 250

## 2021-07-03 MED ORDER — IRON SUCROSE 20 MG/ML IV SOLN
200.0000 mg | Freq: Once | INTRAVENOUS | Status: AC
  Administered 2021-07-03: 200 mg via INTRAVENOUS
  Filled 2021-07-03: qty 10

## 2021-07-03 NOTE — Patient Instructions (Signed)

## 2021-07-06 ENCOUNTER — Inpatient Hospital Stay: Payer: BC Managed Care – PPO

## 2021-07-09 ENCOUNTER — Other Ambulatory Visit: Payer: Self-pay

## 2021-07-09 ENCOUNTER — Inpatient Hospital Stay: Payer: BC Managed Care – PPO | Attending: Oncology

## 2021-07-09 VITALS — BP 158/77 | HR 79 | Temp 96.9°F | Resp 16

## 2021-07-09 DIAGNOSIS — D508 Other iron deficiency anemias: Secondary | ICD-10-CM

## 2021-07-09 DIAGNOSIS — D509 Iron deficiency anemia, unspecified: Secondary | ICD-10-CM | POA: Insufficient documentation

## 2021-07-09 MED ORDER — SODIUM CHLORIDE 0.9 % IV SOLN: 200.0000 mg | INTRAVENOUS | Status: DC

## 2021-07-09 MED ORDER — SODIUM CHLORIDE 0.9 % IV SOLN
Freq: Once | INTRAVENOUS | Status: AC
  Filled 2021-07-09: qty 250

## 2021-07-09 MED ORDER — IRON SUCROSE 20 MG/ML IV SOLN
200.0000 mg | Freq: Once | INTRAVENOUS | Status: AC
  Administered 2021-07-09: 200 mg via INTRAVENOUS
  Filled 2021-07-09: qty 10

## 2021-07-14 ENCOUNTER — Telehealth: Payer: Self-pay | Admitting: Oncology

## 2021-07-14 ENCOUNTER — Inpatient Hospital Stay: Payer: BC Managed Care – PPO | Admitting: Oncology

## 2021-07-14 ENCOUNTER — Encounter: Payer: Self-pay | Admitting: Oncology

## 2021-07-14 ENCOUNTER — Other Ambulatory Visit: Payer: Self-pay

## 2021-07-14 DIAGNOSIS — D509 Iron deficiency anemia, unspecified: Secondary | ICD-10-CM | POA: Diagnosis not present

## 2021-07-14 NOTE — Telephone Encounter (Signed)
Pt called to cancel appt for today. Please call to reschedule at [phone]

## 2021-07-14 NOTE — Progress Notes (Signed)
I connected with Cheryl Wallace on 07/14/21 at  1:00 PM EST by video enabled telemedicine visit and verified that I am speaking with the correct person using two identifiers.   I discussed the limitations, risks, security and privacy concerns of performing an evaluation and management service by telemedicine and the availability of in-person appointments. I also discussed with the patient that there may be a patient responsible charge related to this service. The patient expressed understanding and agreed to proceed.  Other persons participating in the visit and their role in the encounter:  none  Patient's location:  home Provider's location:  work  Risk analyst Complaint: Discuss results of blood work  History of present illness: patient is a 70 year old female with a past medical history significant for hypertension type 2 diabetes CKD and she has been referred to [place] for anemia.  Her most recent CBC from 10/3/2022Showed white count of 10, H&H of 10.3/34.1 with an MCV of 73.7 and a platelet count of 331.  Her prior CBC from September 2022 showed an H&H of 9.8/23.9.  Patient has had chronic microcytosis with evidence of iron deficiency in the past.  No recent iron studies checked.  Patient denies any blood loss in her stool or urine.  Denies any dark melanotic stools.  Family history positive for colon cancer in her mother in her 2s.  She has had a colonoscopy in June 2021 by Dr. Alice Reichert which showed nonbleeding internal hemorrhoids but otherwise normal.  She has a history of GERD and has had an EGD many years ago.  Denies any changes in her appetite or weight.  Denies any difficulty swallowing.  Denies any consistent use of NSAIDs.  She is postmenopausal  Patient is currently receiving Venofer.  Results of blood work from 06/24/2021 were as follows: CBC showed an H&H of 10.4/35.6 with an MCV of 76 and a platelet count of 306.  Ferritin levels were low at 7.  Iron saturation low at 7% with an elevated TIBC of  568.  B12 folate TSH normal.  Haptoglobin normal.  Myeloma panel showed no M protein.  Interval history patient has received 3 doses of Venofer so far.  She still has some ongoing fatigue but does report improvement in her energy levels.   Review of Systems  Constitutional:  Positive for malaise/fatigue. Negative for chills, fever and weight loss.  HENT:  Negative for congestion, ear discharge and nosebleeds.   Eyes:  Negative for blurred vision.  Respiratory:  Negative for cough, hemoptysis, sputum production, shortness of breath and wheezing.   Cardiovascular:  Negative for chest pain, palpitations, orthopnea and claudication.  Gastrointestinal:  Negative for abdominal pain, blood in stool, constipation, diarrhea, heartburn, melena, nausea and vomiting.  Genitourinary:  Negative for dysuria, flank pain, frequency, hematuria and urgency.  Musculoskeletal:  Negative for back pain, joint pain and myalgias.  Skin:  Negative for rash.  Neurological:  Negative for dizziness, tingling, focal weakness, seizures, weakness and headaches.  Endo/Heme/Allergies:  Does not bruise/bleed easily.  Psychiatric/Behavioral:  Negative for depression and suicidal ideas. The patient does not have insomnia.    Allergies  Allergen Reactions   Shellfish Allergy Swelling    Swelling of face, lips and hands. No difficulty breathing. No problems with topical betadine   Lisinopril Cough    cough   Ozempic (0.25 Or 0.5 Mg-Dose) [Semaglutide(0.25 Or 0.5mg -Dos)] Nausea And Vomiting   Hctz [Hydrochlorothiazide]     Muscle cramps   Penicillins Rash    Past Medical History:  Diagnosis Date   Acute meniscal tear, medial, right, sequela 09/22/2020   Allergic rhinitis    Anemia    iron deficiency   Arthritis    osteoarthritis right knee   Chronic kidney disease    nephropathy d/t diabetes   Complication of anesthesia 1981   While pt was under anesthesia, pt had an asthema attack during BTL.   Depression,  major, single episode, mild ([place]) 09/04/2016   Elbow tendonitis    a. bilat, s/p surgery.   Essential hypertension    Functional ovarian cysts 2011   a. s/p TAH (1994);  b. s/p oopherectomy (~2011)   Gallstones    GERD (gastroesophageal reflux disease)    History of kidney stones    Hyperlipidemia    Insomnia    Low grade fever    Obesity    Polyarthralgia    Seasonal allergies    Seizure ([place])    last episode at 14 years. nothing since. unknown etiology   Tinnitus    Type 2 diabetes mellitus ([place])    a.  Hemoglobin A1c in July 2016, 6.7.    Past Surgical History:  Procedure Laterality Date   ABDOMINAL HYSTERECTOMY  1994   COLONOSCOPY  September 2008   Non-bleeding internal hemorhoids identified.   COLONOSCOPY WITH PROPOFOL N/A 02/25/2020   Procedure: COLONOSCOPY WITH PROPOFOL;  Surgeon: Toledo, Benay Pike, MD;  Location: [facility];  Service: Gastroenterology;  Laterality: N/A;   ELBOW SURGERY Bilateral    d/t tendonitis   FRACTURE SURGERY Right    wrist/hand with plate   KNEE ARTHROSCOPY Right 11/18/2020   Procedure: Right knee arthroscopy, partial medial meniscectomy;  Surgeon: Hessie Knows, MD;  Location: [facility] ORS;  Service: Orthopedics;  Laterality: Right;   NASAL SINUS SURGERY  09/18/2015   Procedure: IMAGE GUIDED SINUS SURGERY, BILATERAL MAXILLARY BALLOON SINUPLASTY, BILATERAL FRONTAL BALLOON SINUPLASTY, RIGHT SPHENOID  SINUPLASTY, RIGHT CONCHABULLOSA RESECTION;  Surgeon: Carloyn Manner, MD;  Location: [facility] ORS;  Service: ENT;;   OOPHORECTOMY  04/2010   bilateral, 7 cm benign tumor left ovary   OPEN REDUCTION INTERNAL FIXATION (ORIF) DISTAL RADIAL FRACTURE Right 02/07/2017   Procedure: OPEN REDUCTION INTERNAL FIXATION (ORIF) DISTAL RADIAL FRACTURE;  Surgeon: Hessie Knows, MD;  Location: [facility] ORS;  Service: Orthopedics;  Laterality: Right;   PARTIAL HYSTERECTOMY  1994   still has ovaries, removed for fibroids   TONSILLECTOMY N/A 09/18/2015   Procedure: TONSILLECTOMY;   Surgeon: Carloyn Manner, MD;  Location: [facility] ORS;  Service: ENT;  Laterality: N/A;   TONSILLECTOMY     TUBAL LIGATION     UPPER GI ENDOSCOPY  2011    Social History   Socioeconomic History   Marital status: Divorced    Spouse name: Not on file   Number of children: Not on file   Years of education: Not on file   Highest education level: Not on file  Occupational History   Occupation: school and bus driver    Comment: still working  Tobacco Use   Smoking status: Former    Packs/day: 1.50    Years: 10.00    Pack years: 15.00    Types: Cigarettes    Quit date: 09/05/1984    Years since quitting: 36.8   Smokeless tobacco: Never   Tobacco comments:    remote, quit 30 years ago  Vaping Use   Vaping Use: Never used  Substance and Sexual Activity   Alcohol use: Not Currently    Alcohol/week: 0.0 standard drinks    Comment:  on occasion, martinis maybe twice a year (Christmas & New Years).   Drug use: No   Sexual activity: Not Currently  Other Topics Concern   Not on file  Social History Narrative   Patient lives alone. Currently working. Daughter lives somewhat nearby.    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     Does not routinely exercise.  Always uses seat belts.  Has well water.   Social Determinants of Health   Financial Resource Strain: Not on file  Food Insecurity: Not on file  Transportation Needs: Not on file  Physical Activity: Not on file  Stress: Not on file  Social Connections: Not on file  Intimate Partner Violence: Not on file    Family History  Problem Relation Age of Onset   Cancer Mother        colon   Tuberculosis Father    Diabetes Father    Heart disease Father        MI x 2   Heart attack Father    Diabetes Sister        half sister     Current Outpatient Medications:    acetaminophen (TYLENOL) 500 MG tablet, Take 500 mg by mouth every 6 (six) hours as needed., Disp: , Rfl:    amLODipine (NORVASC) 10 MG tablet, TAKE 1  TABLET BY MOUTH EVERYDAY AT BEDTIME, Disp: 90 tablet, Rfl: 1   aspirin 81 MG chewable tablet, Chew 81 mg by mouth daily., Disp: , Rfl:    atorvastatin (LIPITOR) 20 MG tablet, TAKE 1 TABLET BY MOUTH EVERY DAY, Disp: 90 tablet, Rfl: 1   Azelastine HCl 137 MCG/SPRAY SOLN, Place 1 spray into both nostrils 2 (two) times daily., Disp: , Rfl:    carvedilol (COREG) 3.125 MG tablet, TAKE 1 TABLET (3.125 MG TOTAL) BY MOUTH 2 (TWO) TIMES DAILY WITH A MEAL., Disp: 180 tablet, Rfl: 1   cholecalciferol (VITAMIN D) 1000 UNITS tablet, Take 1,000 Units by mouth daily. , Disp: , Rfl:    DULoxetine HCl 40 MG CPEP, Take by mouth., Disp: , Rfl:    fluticasone (FLONASE) 50 MCG/ACT nasal spray, Place into the nose., Disp: , Rfl:    glipiZIDE (GLUCOTROL) 10 MG tablet, TAKE 1 TABLET (10 MG TOTAL) BY MOUTH 2 (TWO) TIMES DAILY BEFORE A MEAL., Disp: 180 tablet, Rfl: 1   JARDIANCE 10 MG TABS tablet, TAKE 1 TABLET BY MOUTH EVERY DAY, Disp: 90 tablet, Rfl: 1   metFORMIN (GLUCOPHAGE) 1000 MG tablet, TAKE 1 TABLET BY MOUTH 2 TIMES DAILY WITH A MEAL., Disp: 180 tablet, Rfl: 1   omeprazole (PRILOSEC) 40 MG capsule, Take by mouth., Disp: , Rfl:    tiZANidine (ZANAFLEX) 2 MG tablet, Take 1 tablet by mouth every 6 (six) hours as needed., Disp: , Rfl:    valsartan (DIOVAN) 40 MG tablet, TAKE 1 TABLET BY MOUTH EVERYDAY AT BEDTIME, Disp: 90 tablet, Rfl: 1   zolpidem (AMBIEN) 5 MG tablet, Take 1 tablet (5 mg total) by mouth at bedtime as needed for sleep., Disp: 30 tablet, Rfl: 5   predniSONE (DELTASONE) 20 MG tablet, Take 20 mg by mouth daily. (Patient not taking: Reported on 06/24/2021), Disp: , Rfl:    promethazine-dextromethorphan (PROMETHAZINE-DM) 6.25-15 MG/5ML syrup, Take by mouth. (Patient not taking: Reported on 06/24/2021), Disp: , Rfl:    traMADol (ULTRAM) 50 MG tablet, Take 50 mg by mouth every 6 (six) hours as needed. (Patient not taking: Reported on 06/24/2021), Disp: , Rfl:   No results  found.  No images are attached to  the encounter.   CMP Latest Ref Rng & Units 06/24/2021  Glucose 70 - 99 mg/dL 173(H)  BUN 8 - 23 mg/dL 13  Creatinine 0.44 - 1.00 mg/dL 0.76  Sodium 135 - 145 mmol/L 135  Potassium 3.5 - 5.1 mmol/L 3.2(L)  Chloride 98 - 111 mmol/L 101  CO2 22 - 32 mmol/L 27  Calcium 8.9 - 10.3 mg/dL 8.6(L)  Total Protein 6.5 - 8.1 g/dL 7.4  Total Bilirubin 0.3 - 1.2 mg/dL 0.7  Alkaline Phos 38 - 126 U/L 109  AST 15 - 41 U/L 19  ALT 0 - 44 U/L 19   CBC Latest Ref Rng & Units 06/24/2021  WBC 4.0 - 10.5 K/uL 7.1  Hemoglobin 12.0 - 15.0 g/dL 10.4(L)  Hematocrit 36.0 - 46.0 % 35.6(L)  Platelets 150 - 400 K/uL 306     Observation/objective: Appears in no acute distress over video visit today.  Breathing is nonlabored  Assessment and plan: Patient is a 70 year old female with microcytic anemia likely secondary to iron deficiency  Discussed the results of blood work with the patient which shows chronic anemia with a hemoglobin that has remained around 10 for over a year.  Clear evidence of iron deficiency as evidenced by low ferritin and elevated TIBC.  She has received 3 doses of Venofer so far and will receive 2 more doses this week.  Repeat CBC ferritin and iron studies in 6 weeks and 12 weeks and I will see her in 12 weeks.  We will reach out to [facility] so they can evaluate the patient and consider need for repeat endoscopy  Follow-up instructions: As above  I discussed the assessment and treatment plan with the patient. The patient was provided an opportunity to ask questions and all were answered. The patient agreed with the plan and demonstrated an understanding of the instructions.   The patient was advised to call back or seek an in-person evaluation if the symptoms worsen or if the condition fails to improve as anticipated.    Visit Diagnosis: 1. Iron deficiency anemia, unspecified iron deficiency anemia type     Dr. Randa Evens, MD, MPH [facility] Tel-  [phone] 07/14/2021 1:34 PM

## 2021-07-16 ENCOUNTER — Inpatient Hospital Stay: Payer: BC Managed Care – PPO

## 2021-07-17 ENCOUNTER — Other Ambulatory Visit: Payer: Self-pay | Admitting: Surgery

## 2021-07-17 ENCOUNTER — Other Ambulatory Visit: Payer: Self-pay

## 2021-07-17 ENCOUNTER — Inpatient Hospital Stay: Payer: BC Managed Care – PPO

## 2021-07-17 VITALS — BP 161/90 | HR 88 | Temp 97.0°F | Resp 18

## 2021-07-17 DIAGNOSIS — D508 Other iron deficiency anemias: Secondary | ICD-10-CM

## 2021-07-17 DIAGNOSIS — D509 Iron deficiency anemia, unspecified: Secondary | ICD-10-CM | POA: Diagnosis not present

## 2021-07-17 MED ORDER — IRON SUCROSE 20 MG/ML IV SOLN
200.0000 mg | Freq: Once | INTRAVENOUS | Status: AC
  Administered 2021-07-17: 200 mg via INTRAVENOUS
  Filled 2021-07-17: qty 10

## 2021-07-17 MED ORDER — SODIUM CHLORIDE 0.9 % IV SOLN: 200.0000 mg | INTRAVENOUS | Status: DC

## 2021-07-17 MED ORDER — SODIUM CHLORIDE 0.9 % IV SOLN
Freq: Once | INTRAVENOUS | Status: AC
  Filled 2021-07-17: qty 250

## 2021-07-17 NOTE — Patient Instructions (Signed)
[facility]COL[facility]  Discharge Instructions: Thank you for choosing [facility] to provide your oncology and hematology care.  If you have a lab appointment with the [facility], please go directly to the [facility] and check in at the registration area.  Wear comfortable clothing and clothing appropriate for easy access to any Portacath or PICC line.   We strive to give you quality time with your provider. You may need to reschedule your appointment if you arrive late (15 or more minutes).  Arriving late affects you and other patients whose appointments are after yours.  Also, if you miss three or more appointments without notifying the office, you may be dismissed from the clinic at the provider's discretion.      For prescription refill requests, have your pharmacy contact our office and allow 72 hours for refills to be completed.    Today you received the following chemotherapy and/or immunotherapy agents VENOFER      To help prevent nausea and vomiting after your treatment, we encourage you to take your nausea medication as directed.  BELOW ARE SYMPTOMS THAT SHOULD BE REPORTED IMMEDIATELY: *FEVER GREATER THAN 100.4 F (38 C) OR HIGHER *CHILLS OR SWEATING *NAUSEA AND VOMITING THAT IS NOT CONTROLLED WITH YOUR NAUSEA MEDICATION *UNUSUAL SHORTNESS OF BREATH *UNUSUAL BRUISING OR BLEEDING *URINARY PROBLEMS (pain or burning when urinating, or frequent urination) *BOWEL PROBLEMS (unusual diarrhea, constipation, pain near the anus) TENDERNESS IN MOUTH AND THROAT WITH OR WITHOUT PRESENCE OF ULCERS (sore throat, sores in mouth, or a toothache) UNUSUAL RASH, SWELLING OR PAIN  UNUSUAL VAGINAL DISCHARGE OR ITCHING   Items with * indicate a potential emergency and should be followed up as soon as possible or go to the Emergency Department if any problems should occur.  Please show the CHEMOTHERAPY ALERT CARD or IMMUNOTHERAPY ALERT CARD at check-in to  the Emergency Department and triage nurse.  Should you have questions after your visit or need to cancel or reschedule your appointment, please contact [facility]  [phone] and follow the prompts.  Office hours are 8:00 a.m. to 4:30 p.m. Monday - Friday. Please note that voicemails left after 4:00 p.m. may not be returned until the following business day.  We are closed weekends and major holidays. You have access to a nurse at all times for urgent questions. Please call the main number to the clinic [phone] and follow the prompts.  For any non-urgent questions, you may also contact your provider using MyChart. We now offer e-Visits for anyone 65 and older to request care online for non-urgent symptoms. For details visit [email or web address].   Also download the MyChart app! Go to the app store, search "MyChart", open the app, select [facility], and log in with your MyChart username and password.  Due to Covid, a mask is required upon entering the hospital/clinic. If you do not have a mask, one will be given to you upon arrival. For doctor visits, patients may have 1 support person aged 76 or older with them. For treatment visits, patients cannot have anyone with them due to current Covid guidelines and our immunocompromi  Iron Sucrose Injection What is this medication? IRON SUCROSE (EYE ern SOO krose) treats low levels of iron (iron deficiency anemia) in people with kidney disease. Iron is a mineral that plays an important role in making red blood cells, which carry oxygen from your lungs to the rest of your body. This medicine may be used  for other purposes; ask your health care provider or pharmacist if you have questions. COMMON BRAND NAME(S): Venofer What should I tell my care team before I take this medication? They need to know if you have any of these conditions: Anemia not caused by low iron levels Heart disease High levels of iron in the  blood Kidney disease Liver disease An unusual or allergic reaction to iron, other medications, foods, dyes, or preservatives Pregnant or trying to get pregnant Breast-feeding How should I use this medication? This medication is for infusion into a vein. It is given in a hospital or clinic setting. Talk to your care team about the use of this medication in children. While this medication may be prescribed for children as young as 2 years for selected conditions, precautions do apply. Overdosage: If you think you have taken too much of this medicine contact a poison control center or emergency room at once. NOTE: This medicine is only for you. Do not share this medicine with others. What if I miss a dose? It is important not to miss your dose. Call your care team if you are unable to keep an appointment. What may interact with this medication? Do not take this medication with any of the following: Deferoxamine Dimercaprol Other iron products This medication may also interact with the following: Chloramphenicol Deferasirox This list may not describe all possible interactions. Give your health care provider a list of all the medicines, herbs, non-prescription drugs, or dietary supplements you use. Also tell them if you smoke, drink alcohol, or use illegal drugs. Some items may interact with your medicine. What should I watch for while using this medication? Visit your care team regularly. Tell your care team if your symptoms do not start to get better or if they get worse. You may need blood work done while you are taking this medication. You may need to follow a special diet. Talk to your care team. Foods that contain iron include: whole grains/cereals, dried fruits, beans, or peas, leafy green vegetables, and organ meats (liver, kidney). What side effects may I notice from receiving this medication? Side effects that you should report to your care team as soon as possible: Allergic  reactions--skin rash, itching, hives, swelling of the face, lips, tongue, or throat Low blood pressure--dizziness, feeling faint or lightheaded, blurry vision Shortness of breath Side effects that usually do not require medical attention (report to your care team if they continue or are bothersome): Flushing Headache Joint pain Muscle pain Nausea Pain, redness, or irritation at injection site This list may not describe all possible side effects. Call your doctor for medical advice about side effects. You may report side effects to FDA at 1-800-FDA-1088. Where should I keep my medication? This medication is given in a hospital or clinic and will not be stored at home. NOTE: This sheet is a summary. It may not cover all possible information. If you have questions about this medicine, talk to your doctor, pharmacist, or health care provider.  2022 Elsevier/Gold Standard (2021-01-16 00:00:00)

## 2021-07-24 ENCOUNTER — Other Ambulatory Visit: Payer: Self-pay

## 2021-07-24 ENCOUNTER — Inpatient Hospital Stay: Payer: BC Managed Care – PPO

## 2021-07-24 VITALS — BP 160/75 | HR 77 | Temp 97.0°F | Resp 18

## 2021-07-24 DIAGNOSIS — D508 Other iron deficiency anemias: Secondary | ICD-10-CM

## 2021-07-24 DIAGNOSIS — D509 Iron deficiency anemia, unspecified: Secondary | ICD-10-CM | POA: Diagnosis not present

## 2021-07-24 MED ORDER — SODIUM CHLORIDE 0.9 % IV SOLN: 200.0000 mg | INTRAVENOUS | Status: DC

## 2021-07-24 MED ORDER — SODIUM CHLORIDE 0.9 % IV SOLN
Freq: Once | INTRAVENOUS | Status: AC
  Filled 2021-07-24: qty 250

## 2021-07-24 MED ORDER — IRON SUCROSE 20 MG/ML IV SOLN
200.0000 mg | Freq: Once | INTRAVENOUS | Status: AC
  Administered 2021-07-24: 200 mg via INTRAVENOUS
  Filled 2021-07-24: qty 10

## 2021-07-24 NOTE — Patient Instructions (Signed)

## 2021-07-29 ENCOUNTER — Other Ambulatory Visit: Payer: Self-pay

## 2021-07-29 ENCOUNTER — Encounter
Admission: RE | Admit: 2021-07-29 | Discharge: 2021-07-29 | Disposition: A | Discharge status: Home / Self Care | Payer: BC Managed Care – PPO | Source: Ambulatory Visit | Attending: Surgery | Admitting: Surgery

## 2021-07-29 ENCOUNTER — Other Ambulatory Visit: Payer: Self-pay | Admitting: Internal Medicine

## 2021-07-29 ENCOUNTER — Encounter: Payer: Self-pay | Admitting: Internal Medicine

## 2021-07-29 ENCOUNTER — Other Ambulatory Visit
Admission: RE | Admit: 2021-07-29 | Discharge: 2021-07-29 | Disposition: A | Discharge status: Home / Self Care | Payer: BC Managed Care – PPO | Source: Ambulatory Visit | Attending: Surgery | Admitting: Surgery

## 2021-07-29 ENCOUNTER — Ambulatory Visit: Payer: BC Managed Care – PPO | Admitting: Internal Medicine

## 2021-07-29 VITALS — BP 136/78 | HR 99 | Temp 96.2°F | Ht 65.0 in | Wt 221.0 lb

## 2021-07-29 DIAGNOSIS — E785 Hyperlipidemia, unspecified: Secondary | ICD-10-CM

## 2021-07-29 DIAGNOSIS — D508 Other iron deficiency anemias: Secondary | ICD-10-CM

## 2021-07-29 DIAGNOSIS — I10 Essential (primary) hypertension: Secondary | ICD-10-CM

## 2021-07-29 DIAGNOSIS — N289 Disorder of kidney and ureter, unspecified: Secondary | ICD-10-CM | POA: Diagnosis not present

## 2021-07-29 DIAGNOSIS — E1169 Type 2 diabetes mellitus with other specified complication: Secondary | ICD-10-CM

## 2021-07-29 DIAGNOSIS — E1121 Type 2 diabetes mellitus with diabetic nephropathy: Secondary | ICD-10-CM | POA: Diagnosis not present

## 2021-07-29 DIAGNOSIS — Z6838 Body mass index (BMI) 38.0-38.9, adult: Secondary | ICD-10-CM

## 2021-07-29 DIAGNOSIS — F320 Major depressive disorder, single episode, mild: Secondary | ICD-10-CM

## 2021-07-29 DIAGNOSIS — R944 Abnormal results of kidney function studies: Secondary | ICD-10-CM

## 2021-07-29 LAB — IBC + FERRITIN
Ferritin: 132.3 ng/mL (ref 10.0–291.0)
Iron: 84 ug/dL (ref 42–145)
Saturation Ratios: 19.9 % — ABNORMAL LOW (ref 20.0–50.0)
TIBC: 422.8 ug/dL (ref 250.0–450.0)
Transferrin: 302 mg/dL (ref 212.0–360.0)

## 2021-07-29 LAB — COMPREHENSIVE METABOLIC PANEL
ALT: 12 U/L (ref 0–35)
AST: 13 U/L (ref 0–37)
Albumin: 4 g/dL (ref 3.5–5.2)
Alkaline Phosphatase: 82 U/L (ref 39–117)
BUN: 18 mg/dL (ref 6–23)
CO2: 26 mEq/L (ref 19–32)
Calcium: 9.7 mg/dL (ref 8.4–10.5)
Chloride: 106 mEq/L (ref 96–112)
Creatinine, Ser: 0.9 mg/dL (ref 0.40–1.20)
GFR: 65.58 mL/min (ref >60.00)
Glucose, Bld: 186 mg/dL — ABNORMAL HIGH (ref 70–99)
Potassium: 3.4 mEq/L — ABNORMAL LOW (ref 3.5–5.1)
Sodium: 141 mEq/L (ref 135–145)
Total Bilirubin: 0.5 mg/dL (ref 0.2–1.2)
Total Protein: 6.8 g/dL (ref 6.0–8.3)

## 2021-07-29 LAB — CBC WITH DIFFERENTIAL/PLATELET
Basophils Absolute: 0 10*3/uL (ref 0.0–0.1)
Basophils Relative: 0.5 % (ref 0.0–3.0)
Eosinophils Absolute: 0.2 10*3/uL (ref 0.0–0.7)
Eosinophils Relative: 4.1 % (ref 0.0–5.0)
HCT: 39.8 % (ref 36.0–46.0)
Hemoglobin: 12.6 g/dL (ref 12.0–15.0)
Lymphocytes Relative: 32.1 % (ref 12.0–46.0)
Lymphs Abs: 1.8 10*3/uL (ref 0.7–4.0)
MCHC: 31.6 g/dL (ref 30.0–36.0)
MCV: 77.5 fl — ABNORMAL LOW (ref 78.0–100.0)
Monocytes Absolute: 0.4 10*3/uL (ref 0.1–1.0)
Monocytes Relative: 7.1 % (ref 3.0–12.0)
Neutro Abs: 3.1 10*3/uL (ref 1.4–7.7)
Neutrophils Relative %: 56.2 % (ref 43.0–77.0)
Platelets: 279 10*3/uL (ref 150.0–400.0)
RBC: 5.13 Mil/uL — ABNORMAL HIGH (ref 3.87–5.11)
RDW: 23.4 % — ABNORMAL HIGH (ref 11.5–15.5)
WBC: 5.5 10*3/uL (ref 4.0–10.5)

## 2021-07-29 LAB — LIPID PANEL
Cholesterol: 102 mg/dL (ref 0–200)
HDL: 37.1 mg/dL — ABNORMAL LOW (ref >39.00)
LDL Cholesterol: 30 mg/dL (ref 0–99)
NonHDL: 65.18
Total CHOL/HDL Ratio: 3
Triglycerides: 177 mg/dL — ABNORMAL HIGH (ref 0.0–149.0)
VLDL: 35.4 mg/dL (ref 0.0–40.0)

## 2021-07-29 LAB — POTASSIUM: Potassium: 3.6 mmol/L (ref 3.5–5.1)

## 2021-07-29 LAB — POCT GLYCOSYLATED HEMOGLOBIN (HGB A1C): Hemoglobin A1C: 6.5 % — AB (ref 4.0–5.6)

## 2021-07-29 NOTE — Progress Notes (Signed)
Perioperative Services Pre-Admission/Anesthesia Testing   Date: 07/29/21 Name: Cheryl Wallace MRN:   [id number]  Re: Consideration of preoperative prophylactic antibiotic change   Request sent to: Poggi, Marshall Cork, MD (routed and/or faxed via [facility])  Planned Surgical Procedure(s):    Case: [id number] Date/Time: 08/06/21 1318   Procedure: RIGHT SHOULDER ARTHROSCOPY WITH DEBRIDEMENT, DECOMPRESSION, AND POSSIBLE ROTATOR CUFF REPAIR (Right: Shoulder)   Anesthesia type: Choice   Pre-op diagnosis:      Rotator cuff tendinitis, right M75.81     Traumatic complete tear of right rotator cuff, initial encounter S46.011A   Location: [facility]   Surgeons: Corky Mull, MD   Clinical Notes:  Patient has a documented allergy to PCN  Advising that PCN has caused her to experience non-specific rash in the past.   Screened as appropriate for cephalosporin use during medication reconciliation No immediate angioedema, dysphagia, SOB, anaphylaxis symptoms. No severe rash involving mucous membranes or skin necrosis. No hospital admissions related to side effects of PCN/cephalosporin use.  No documented reaction to PCN or cephalosporin in the last 10 years.  Request:  As an evidence based approach to reducing the rate of incidence for post-operative SSI and the development of MDROs, could an agent with narrower coverage for preoperative prophylaxis in this patient's upcoming surgical course be considered?   Currently ordered preoperative prophylactic ABX: clindamycin.   Specifically requesting change to cephalosporin (CEFAZOLIN).   Please communicate decision with me and I will change the orders in Epic as per your direction.   Things to consider: Many patients report that they were "allergic" to PCN earlier in life, however this does not translate into a true lifelong allergy. Patients can lose sensitivity to specific IgE antibodies over time if PCN is avoided  (Kleris & Lugar, 2019).  Up to 10% of the adult population and 15% of hospitalized patients report an allergy to PCN, however clinical studies suggest that 90% of those reporting an allergy can tolerate PCN antibiotics (Kleris & Lugar, 2019).  Cross-sensitivity between PCN and cephalosporins has been documented as being as high as 10%, however this estimation included data believed to have been collected in a setting where there was contamination. Newer data suggests that the prevalence of cross-sensitivity between PCN and cephalosporins is actually estimated to be closer to 1% (Hermanides et al., 2018).   Patients labeled as PCN allergic, whether they are truly allergic or not, have been found to have inferior outcomes in terms of rates of serious infection, and these patients tend to have longer hospital stays ([place], 2019).  Treatment related secondary infections, such as Clostridioides difficile, have been linked to the improper use of broad spectrum antibiotics in patients improperly labeled as PCN allergic (Kleris & Lugar, 2019).  Anaphylaxis from cephalosporins is rare and the evidence suggests that there is no increased risk of an anaphylactic type reaction when cephalosporins are used in a PCN allergic patient (Pichichero, 2006).  Citations: Hermanides J, Lemkes BA, Prins Pearla Dubonnet MW, Terreehorst I. Presumed ?-Lactam Allergy and Cross-reactivity in the Operating Theater: A Practical Approach. Anesthesiology. 2018 Aug;129(2):335-342. doi: 10.1097/ALN.0000000000002252. PMID: 01027253.  Kleris, [place]., & Lugar, P. L. (2019). Things We Do For No Reason: Failing to Question a Penicillin Allergy History. Journal of hospital medicine, 14(10), [phone]. Advance online publication. [email or web address].[email or web address]/  Pichichero, M. E. (2006). Cephalosporins can be prescribed safely for penicillin-allergic patients. Journal of family medicine, 55(2), 106-112. Accessed:  https://cdn.mdedge.com/files/s51fs-public/Document/September-2017/5502JFP_AppliedEvidence1.pdf   Gaspar Bidding  Pearline Cables, MSN, APRN, FNP-C, [facility]  Peri-operative Services Nurse Practitioner FAX: [phone] 07/29/21 5:48 PM

## 2021-07-29 NOTE — Patient Instructions (Signed)
For your post nasal drip   You can use generic Benadryl  at bedtime ,  and yoou can consider adding one of these newer second generation antihistamines that are longer acting, non sedating and  available OTC:  Generic  Zyrtec, which is cetirizine.    generic Allegra , available generically as fexofenadine ;  180 mg once daily strengths.    Generic Claritin :  also available as loratidine .     Avoid anything with "sinus" in the title ;  will raise your blood pressure

## 2021-07-29 NOTE — Assessment & Plan Note (Addendum)
Losing weight gradually.  Intolerant of ozempic due to recurrent N/V at lowest dose

## 2021-07-29 NOTE — Progress Notes (Signed)
Subjective:  Patient ID: Cheryl Wallace, female    DOB: 09-05-1951  Age: 70 y.o. MRN: [id number]  CC: The primary encounter diagnosis was Type 2 diabetes mellitus with nephropathy ([place]). Diagnoses of Class 2 severe obesity due to excess calories with serious comorbidity and body mass index (BMI) of 38.0 to 38.9 in adult [facility]), Hyperlipidemia associated with type 2 diabetes mellitus ([place]), Other iron deficiency anemia, Decreased glomerular filtration rate (GFR), Depression, major, single episode, mild ([place]), and Essential hypertension were also pertinent to this visit.  HPI Cheryl Wallace presents for  follow up on U7OZ complicated by  obesity and hypertension  Chief Complaint  Patient presents with   Follow-up   This visit occurred during the SARS-CoV-2 public health emergency.  Safety protocols were in place, including screening questions prior to the visit, additional usage of staff PPE, and extensive cleaning of exam room while observing appropriate contact time as indicated for disinfecting solutions.   1) Shoulder pain:  she is having right shoulder arthroscopy Dec 1 for persistent pain and decreased ROM despite PT . Poggi :preoperative eval is this afternoon.   2) IDA:  she has been Receiving iv iron . has had 5 infusions  since early October  last one was Nov 18 .  The ID was noted during .  Scheduled to see Gi Nov 29.    DM: T2DM:  She  feels generally well,  But is not  exercising regularly due to right shoulder pain and history of fluid retention. Using portion reduction to lose weight. Checking  blood sugars less than once daily at variable times, usually only if she feels she may be having a hypoglycemic event. .  BS have been under 130 fasting and < 150 post prandially.  Denies any recent hypoglyemic events.  Taking   medications as directed. Following a carbohydrate modified diet 6 days per week. Denies numbness, burning and tingling of extremities. Appetite is good.    HTN: BPs  have been elevated during iron infusions .  Home readings have been < 130/80  Retiring Dec 16   Outpatient Medications Prior to Visit  Medication Sig Dispense Refill   acetaminophen (TYLENOL) 500 MG tablet Take 1,000 mg by mouth every 6 (six) hours as needed for mild pain or moderate pain.     amLODipine (NORVASC) 10 MG tablet TAKE 1 TABLET BY MOUTH EVERYDAY AT BEDTIME (Patient taking differently: Take 10 mg by mouth every morning. TAKE 1 TABLET BY MOUTH EVERYDAY AT BEDTIME) 90 tablet 1   aspirin 81 MG chewable tablet Chew 81 mg by mouth daily.     atorvastatin (LIPITOR) 20 MG tablet TAKE 1 TABLET BY MOUTH EVERY DAY (Patient taking differently: Take 20 mg by mouth at bedtime.) 90 tablet 1   carvedilol (COREG) 3.125 MG tablet TAKE 1 TABLET (3.125 MG TOTAL) BY MOUTH 2 (TWO) TIMES DAILY WITH A MEAL. 180 tablet 1   cholecalciferol (VITAMIN D) 1000 UNITS tablet Take 1,000 Units by mouth daily.      DULoxetine HCl 40 MG CPEP Take 40 mg by mouth at bedtime.     fluticasone (FLONASE) 50 MCG/ACT nasal spray Place 2 sprays into both nostrils daily as needed for allergies.     glipiZIDE (GLUCOTROL) 10 MG tablet TAKE 1 TABLET (10 MG TOTAL) BY MOUTH 2 (TWO) TIMES DAILY BEFORE A MEAL. 180 tablet 1   JARDIANCE 10 MG TABS tablet TAKE 1 TABLET BY MOUTH EVERY DAY (Patient taking differently: 10 mg at  bedtime.) 90 tablet 1   metFORMIN (GLUCOPHAGE) 1000 MG tablet TAKE 1 TABLET BY MOUTH 2 TIMES DAILY WITH A MEAL. 180 tablet 1   omeprazole (PRILOSEC) 40 MG capsule Take 40 mg by mouth 2 (two) times daily.     tiZANidine (ZANAFLEX) 2 MG tablet Take 2 mg by mouth at bedtime.     valsartan (DIOVAN) 40 MG tablet TAKE 1 TABLET BY MOUTH EVERYDAY AT BEDTIME 90 tablet 1   zolpidem (AMBIEN) 5 MG tablet Take 1 tablet (5 mg total) by mouth at bedtime as needed for sleep. 30 tablet 5   No facility-administered medications prior to visit.    Review of Systems;  Patient denies headache, fevers, malaise, unintentional weight  loss, skin rash, eye pain, sinus congestion and sinus pain, sore throat, dysphagia,  hemoptysis , cough, dyspnea, wheezing, chest pain, palpitations, orthopnea, edema, abdominal pain, nausea, melena, diarrhea, constipation, flank pain, dysuria, hematuria, urinary  Frequency, nocturia, numbness, tingling, seizures,  Focal weakness, Loss of consciousness,  Tremor, insomnia, depression, anxiety, and suicidal ideation.      Objective:  BP 136/78   Pulse 99   Temp (!) 96.2 F (35.7 C)   Ht 5\' 5"  (1.651 m)   Wt 221 lb (100.2 kg)   SpO2 93%   BMI 36.78 kg/m   BP Readings from Last 3 Encounters:  07/29/21 136/78  07/24/21 (!) 160/75  07/17/21 (!) 161/90    Wt Readings from Last 3 Encounters:  07/29/21 221 lb (100.2 kg)  06/24/21 230 lb (104.3 kg)  03/24/21 234 lb 3.2 oz (106.2 kg)    General appearance: alert, cooperative and appears stated age Ears: normal TM's and external ear canals both ears Throat: lips, mucosa, and tongue normal; teeth and gums normal Neck: no adenopathy, no carotid bruit, supple, symmetrical, trachea midline and thyroid not enlarged, symmetric, no tenderness/mass/nodules Back: symmetric, no curvature. ROM normal. No CVA tenderness. Lungs: clear to auscultation bilaterally Heart: regular rate and rhythm, S1, S2 normal, no murmur, click, rub or gallop Abdomen: soft, non-tender; bowel sounds normal; no masses,  no organomegaly Pulses: 2+ and symmetric Skin: Skin color, texture, turgor normal. No rashes or lesions Lymph nodes: Cervical, supraclavicular, and axillary nodes normal.  Lab Results  Component Value Date   HGBA1C 6.5 (A) 07/29/2021   HGBA1C 7.7 (H) 03/24/2021   HGBA1C 7.4 (H) 10/17/2020    Lab Results  Component Value Date   CREATININE 0.90 07/29/2021   CREATININE 0.76 06/24/2021   CREATININE 1.02 04/02/2021    Lab Results  Component Value Date   WBC 5.5 07/29/2021   HGB 12.6 07/29/2021   HCT 39.8 07/29/2021   PLT 279.0 07/29/2021    GLUCOSE 186 (H) 07/29/2021   CHOL 102 07/29/2021   TRIG 177.0 (H) 07/29/2021   HDL 37.10 (L) 07/29/2021   LDLDIRECT 52.0 09/26/2017   LDLCALC 30 07/29/2021   ALT 12 07/29/2021   AST 13 07/29/2021   NA 141 07/29/2021   K 3.6 07/29/2021   CL 106 07/29/2021   CREATININE 0.90 07/29/2021   BUN 18 07/29/2021   CO2 26 07/29/2021   TSH 1.272 06/24/2021   HGBA1C 6.5 (A) 07/29/2021   MICROALBUR 6.2 (H) 07/17/2020    US RENAL  Result Date: 05/22/2021 CLINICAL DATA:  Stage III A chronic kidney disease. EXAM: RENAL / URINARY TRACT ULTRASOUND COMPLETE COMPARISON:  CT abdomen pelvis 05/11/2013 FINDINGS: Right Kidney: Renal measurements: 11.4 x 5.7 x 4.5 cm = volume: 152 mL. Echogenicity within normal limits. No mass  or hydronephrosis visualized. Left Kidney: Renal measurements: 10.8 x 5.6 x 5.5 cm = volume: 175 mL. Echogenicity within normal limits. No mass or hydronephrosis visualized. Bladder: Appears normal for degree of bladder distention. Other: None IMPRESSION: Negative renal ultrasound Electronically Signed   By: Franchot Gallo M.D.   On: 05/22/2021 09:52    Assessment & Plan:   Problem List Items Addressed This Visit     Obesity    Losing weight gradually.  Intolerant of ozempic due to recurrent N/V at lowest dose       Type 2 diabetes mellitus with nephropathy ([place]) - Primary    Improved  Control on same regimen of Jardiance 10 mg  , metformin, and glipizide.  She has had a failed trial of Ozempic due to persistent nausea and vomiting even at lower dose. Continue glipizide to 10 mg  bid and continue metformin 1000 mg bid .  Eye exam needed; reminder given  Lab Results  Component Value Date   HGBA1C 6.5 (A) 07/29/2021   Lab Results  Component Value Date   MICROALBUR 6.2 (H) 07/17/2020   MICROALBUR 3.5 (H) 10/11/2018           Relevant Orders   POCT HgB A1C (Completed)   Comprehensive metabolic panel (Completed)   Essential hypertension    Well controlled on current  regimen. Renal function stable, no changes today.      Hyperlipidemia associated with type 2 diabetes mellitus ([place])   Relevant Orders   Lipid panel (Completed)   Depression, major, single episode, mild ([place])    The patient is being treated for depression and states that all symptoms remain minimally intrusive  with current regimen . The patient  denies suicidality and panic attacks.       Iron deficiency anemia    Chronic, since 2019.  Now taking iv iron with normal iron stores and normal hgb .   Lab Results  Component Value Date   IRON 84 07/29/2021   TIBC 422.8 07/29/2021   FERRITIN 132.3 07/29/2021   Lab Results  Component Value Date   WBC 5.5 07/29/2021   HGB 12.6 07/29/2021   HCT 39.8 07/29/2021   MCV 77.5 (L) 07/29/2021   PLT 279.0 07/29/2021         Relevant Orders   CBC with Differential/Platelet (Completed)   IBC + Ferritin (Completed)   Decreased glomerular filtration rate (GFR)    Improved  after suspending any NSAIDs  Lab Results  Component Value Date   CREATININE 0.90 07/29/2021   Lab Results  Component Value Date   NA 141 07/29/2021   K 3.6 07/29/2021   CL 106 07/29/2021   CO2 26 07/29/2021          I am having Cheryl Wallace. Cheryl Wallace maintain her cholecalciferol, aspirin, zolpidem, atorvastatin, Jardiance, amLODipine, valsartan, metFORMIN, carvedilol, DULoxetine HCl, fluticasone, omeprazole, tiZANidine, acetaminophen, and glipiZIDE.  No orders of the defined types were placed in this encounter.   There are no discontinued medications.  Follow-up: No follow-ups on file.   Crecencio Mc, MD

## 2021-07-29 NOTE — Patient Instructions (Signed)
Your procedure is scheduled on:08-06-21 Thursday Report to the Registration Desk on the 1st floor of the [facility].Then proceed to the 2nd floor Surgery Desk in the [facility] To find out your arrival time, please call [phone] between 1PM - 3PM on:08-05-21 Wednesday  REMEMBER: Instructions that are not followed completely may result in serious medical risk, up to and including death; or upon the discretion of your surgeon and anesthesiologist your surgery may need to be rescheduled.  Do not eat food after midnight the night before surgery.  No gum chewing, lozengers or hard candies.  You may however, drink Water up to 2 hours before you are scheduled to arrive for your surgery. Do not drink anything within 2 hours of your scheduled arrival time.  Type 1 and Type 2 diabetics should only drink water.  TAKE THESE MEDICATIONS THE MORNING OF SURGERY WITH A SIP OF WATER: -amLODipine (NORVASC) 10 MG tablet -carvedilol (COREG) 3.125 MG tablet -fexofenadine (ALLEGRA) 180 MG tablet -omeprazole (PRILOSEC) 40 MG capsule  Stop Your aspirin 81 MG chewable tablet NOW 07-29-21 Wednesday  Stop your JARDIANCE 10 MG TABS tablet 3 days prior to Surgery-Last dose on 08-02-21 (Sunday)  Stop your metFORMIN (GLUCOPHAGE) 1000 MG tablet 2 days prior to Surgery-Last dose on 08-03-21 (Monday)  One week prior to surgery: Stop Anti-inflammatories (NSAIDS) such as Advil, Aleve, Ibuprofen, Motrin, Naproxen, Naprosyn and Aspirin based products such as Excedrin, Goodys Powder, BC Powder.You may however, continue to take Tylenol if needed for pain up until the day of surgery.  Stop ANY OVER THE COUNTER supplements/vitamins NOW 07-29-21 until after surgery (cholecalciferol (VITAMIN D) 1000 UNITS tablet)  No Alcohol for 24 hours before or after surgery.  No Smoking including e-cigarettes for 24 hours prior to surgery.  No chewable tobacco products for at least 6 hours prior to surgery.  No nicotine patches  on the day of surgery.  Do not use any "recreational" drugs for at least a week prior to your surgery.  Please be advised that the combination of cocaine and anesthesia may have negative outcomes, up to and including death. If you test positive for cocaine, your surgery will be cancelled.  On the morning of surgery brush your teeth with toothpaste and water, you may rinse your mouth with mouthwash if you wish. Do not swallow any toothpaste or mouthwash.  Do not wear jewelry, make-up, hairpins, clips or nail polish.  Do not wear lotions, powders, or perfumes.   Do not shave body from the neck down 48 hours prior to surgery just in case you cut yourself which could leave a site for infection.  Also, freshly shaved skin may become irritated if using the CHG soap.  Contact lenses, hearing aids and dentures may not be worn into surgery.  Do not bring valuables to the hospital. [facility] is not responsible for any missing/lost belongings or valuables.   Notify your doctor if there is any change in your medical condition (cold, fever, infection).  Wear comfortable clothing (specific to your surgery type) to the hospital.  After surgery, you can help prevent lung complications by doing breathing exercises.  Take deep breaths and cough every 1-2 hours. Your doctor may order a device called an Incentive Spirometer to help you take deep breaths. When coughing or sneezing, hold a pillow firmly against your incision with both hands. This is called "splinting." Doing this helps protect your incision. It also decreases belly discomfort.  If you are being admitted to the hospital  overnight, leave your suitcase in the car. After surgery it may be brought to your room.  If you are being discharged the day of surgery, you will not be allowed to drive home. You will need a responsible adult (18 years or older) to drive you home and stay with you that night.   If you are taking public transportation,  you will need to have a responsible adult (18 years or older) with you. Please confirm with your physician that it is acceptable to use public transportation.   Please call the [facility]. at [phone] if you have any questions about these instructions.  Surgery Visitation Policy:  Patients undergoing a surgery or procedure may have one family member or support person with them as long as that person is not COVID-19 positive or experiencing its symptoms.  That person may remain in the waiting area during the procedure and may rotate out with other people.  Inpatient Visitation:    Visiting hours are 7 a.m. to 8 p.m. Up to two visitors ages 16+ are allowed at one time in a patient room. The visitors may rotate out with other people during the day. Visitors must check out when they leave, or other visitors will not be allowed. One designated support person may remain overnight. The visitor must pass COVID-19 screenings, use hand sanitizer when entering and exiting the patient's room and wear a mask at all times, including in the patient's room. Patients must also wear a mask when staff or their visitor are in the room. Masking is required regardless of vaccination status.

## 2021-07-31 ENCOUNTER — Encounter: Payer: Self-pay | Admitting: Internal Medicine

## 2021-07-31 NOTE — Assessment & Plan Note (Signed)
The patient is being treated for depression and states that all symptoms remain minimally intrusive  with current regimen . The patient  denies suicidality and panic attacks.

## 2021-07-31 NOTE — Assessment & Plan Note (Signed)
Chronic, since 2019.  Now taking iv iron with normal iron stores and normal hgb .   Lab Results  Component Value Date   IRON 84 07/29/2021   TIBC 422.8 07/29/2021   FERRITIN 132.3 07/29/2021   Lab Results  Component Value Date   WBC 5.5 07/29/2021   HGB 12.6 07/29/2021   HCT 39.8 07/29/2021   MCV 77.5 (L) 07/29/2021   PLT 279.0 07/29/2021

## 2021-07-31 NOTE — Assessment & Plan Note (Signed)
Well controlled on current regimen. Renal function stable, no changes today. 

## 2021-07-31 NOTE — Telephone Encounter (Signed)
LOV 07/29/21 NOV: 10/30/21

## 2021-07-31 NOTE — Assessment & Plan Note (Signed)
Improved  after suspending any NSAIDs  Lab Results  Component Value Date   CREATININE 0.90 07/29/2021   Lab Results  Component Value Date   NA 141 07/29/2021   K 3.6 07/29/2021   CL 106 07/29/2021   CO2 26 07/29/2021

## 2021-07-31 NOTE — Assessment & Plan Note (Signed)
Improved  Control on same regimen of Jardiance 10 mg  , metformin, and glipizide.  She has had a failed trial of Ozempic due to persistent nausea and vomiting even at lower dose. Continue glipizide to 10 mg  bid and continue metformin 1000 mg bid .  Eye exam needed; reminder given  Lab Results  Component Value Date   HGBA1C 6.5 (A) 07/29/2021   Lab Results  Component Value Date   MICROALBUR 6.2 (H) 07/17/2020   MICROALBUR 3.5 (H) 10/11/2018

## 2021-08-06 ENCOUNTER — Other Ambulatory Visit: Payer: Self-pay

## 2021-08-06 ENCOUNTER — Ambulatory Visit: Payer: BC Managed Care – PPO | Admitting: Certified Registered Nurse Anesthetist

## 2021-08-06 ENCOUNTER — Observation Stay
Admission: RE | Admit: 2021-08-06 | Discharge: 2021-08-08 | Disposition: A | Discharge status: Home / Self Care | Payer: BC Managed Care – PPO | Attending: Internal Medicine | Admitting: Internal Medicine

## 2021-08-06 ENCOUNTER — Encounter
Admission: RE | Disposition: A | Discharge status: Home / Self Care | Payer: Self-pay | Source: Home / Self Care | Attending: Internal Medicine

## 2021-08-06 ENCOUNTER — Ambulatory Visit: Payer: BC Managed Care – PPO

## 2021-08-06 ENCOUNTER — Encounter: Payer: Self-pay | Admitting: Surgery

## 2021-08-06 ENCOUNTER — Ambulatory Visit: Payer: BC Managed Care – PPO | Admitting: Urgent Care

## 2021-08-06 DIAGNOSIS — E119 Type 2 diabetes mellitus without complications: Secondary | ICD-10-CM | POA: Diagnosis not present

## 2021-08-06 DIAGNOSIS — M7541 Impingement syndrome of right shoulder: Secondary | ICD-10-CM | POA: Insufficient documentation

## 2021-08-06 DIAGNOSIS — Z7982 Long term (current) use of aspirin: Secondary | ICD-10-CM | POA: Diagnosis not present

## 2021-08-06 DIAGNOSIS — M75121 Complete rotator cuff tear or rupture of right shoulder, not specified as traumatic: Principal | ICD-10-CM | POA: Insufficient documentation

## 2021-08-06 DIAGNOSIS — J9601 Acute respiratory failure with hypoxia: Secondary | ICD-10-CM | POA: Diagnosis present

## 2021-08-06 DIAGNOSIS — Z87891 Personal history of nicotine dependence: Secondary | ICD-10-CM | POA: Insufficient documentation

## 2021-08-06 DIAGNOSIS — I10 Essential (primary) hypertension: Secondary | ICD-10-CM | POA: Diagnosis not present

## 2021-08-06 DIAGNOSIS — R0902 Hypoxemia: Secondary | ICD-10-CM

## 2021-08-06 DIAGNOSIS — E1169 Type 2 diabetes mellitus with other specified complication: Secondary | ICD-10-CM

## 2021-08-06 DIAGNOSIS — Z01818 Encounter for other preprocedural examination: Secondary | ICD-10-CM

## 2021-08-06 DIAGNOSIS — Z79899 Other long term (current) drug therapy: Secondary | ICD-10-CM | POA: Diagnosis not present

## 2021-08-06 DIAGNOSIS — Z7984 Long term (current) use of oral hypoglycemic drugs: Secondary | ICD-10-CM | POA: Insufficient documentation

## 2021-08-06 HISTORY — PX: SHOULDER ARTHROSCOPY WITH SUBACROMIAL DECOMPRESSION, ROTATOR CUFF REPAIR AND BICEP TENDON REPAIR: SHX5687

## 2021-08-06 LAB — CBC
HCT: 37.6 % (ref 36.0–46.0)
Hemoglobin: 11.6 g/dL — ABNORMAL LOW (ref 12.0–15.0)
MCH: 25.4 pg — ABNORMAL LOW (ref 26.0–34.0)
MCHC: 30.9 g/dL (ref 30.0–36.0)
MCV: 82.3 fL (ref 80.0–100.0)
Platelets: 259 10*3/uL (ref 150–400)
RBC: 4.57 MIL/uL (ref 3.87–5.11)
RDW: 20.8 % — ABNORMAL HIGH (ref 11.5–15.5)
WBC: 8.8 10*3/uL (ref 4.0–10.5)
nRBC: 0 % (ref 0.0–0.2)

## 2021-08-06 LAB — HIV ANTIBODY (ROUTINE TESTING W REFLEX): HIV Screen 4th Generation wRfx: NONREACTIVE

## 2021-08-06 LAB — CREATININE, SERUM
Creatinine, Ser: 0.73 mg/dL (ref 0.44–1.00)
GFR, Estimated: >60 mL/min (ref >60)

## 2021-08-06 LAB — GLUCOSE, CAPILLARY
Glucose-Capillary: 153 mg/dL — ABNORMAL HIGH (ref 70–99)
Glucose-Capillary: 177 mg/dL — ABNORMAL HIGH (ref 70–99)
Glucose-Capillary: 221 mg/dL — ABNORMAL HIGH (ref 70–99)

## 2021-08-06 SURGERY — SHOULDER ARTHROSCOPY WITH SUBACROMIAL DECOMPRESSION, ROTATOR CUFF REPAIR AND BICEP TENDON REPAIR
Anesthesia: General | Site: Shoulder | Laterality: Right

## 2021-08-06 MED ORDER — MIDAZOLAM HCL 2 MG/2ML IJ SOLN
INTRAMUSCULAR | Status: AC
  Administered 2021-08-06: 1 mg via INTRAVENOUS
  Filled 2021-08-06: qty 2

## 2021-08-06 MED ORDER — OXYCODONE HCL 5 MG PO TABS
5.0000 mg | ORAL_TABLET | ORAL | Status: DC | PRN
  Administered 2021-08-07: 5 mg via ORAL
  Filled 2021-08-06: qty 1

## 2021-08-06 MED ORDER — ORAL CARE MOUTH RINSE: 15.0000 mL | Freq: Once | OROMUCOSAL | Status: DC

## 2021-08-06 MED ORDER — PROPOFOL 10 MG/ML IV BOLUS
INTRAVENOUS | Status: DC | PRN
  Administered 2021-08-06: 120 mg via INTRAVENOUS
  Administered 2021-08-06: 20 mg via INTRAVENOUS

## 2021-08-06 MED ORDER — BUPIVACAINE-EPINEPHRINE 0.5% -1:200000 IJ SOLN
INTRAMUSCULAR | Status: DC | PRN
  Administered 2021-08-06: 30 mL

## 2021-08-06 MED ORDER — EPHEDRINE SULFATE 50 MG/ML IJ SOLN
INTRAMUSCULAR | Status: DC | PRN
  Administered 2021-08-06: 10 mg via INTRAVENOUS
  Administered 2021-08-06 (×2): 5 mg via INTRAVENOUS
  Administered 2021-08-06 (×2): 10 mg via INTRAVENOUS

## 2021-08-06 MED ORDER — FENTANYL CITRATE PF 50 MCG/ML IJ SOSY: 50.0000 ug | PREFILLED_SYRINGE | Freq: Once | INTRAMUSCULAR | Status: DC

## 2021-08-06 MED ORDER — SODIUM CHLORIDE 0.9 % IV SOLN: INTRAVENOUS | Status: DC

## 2021-08-06 MED ORDER — OXYCODONE HCL 5 MG PO TABS: 5.0000 mg | ORAL_TABLET | ORAL | 0 refills | Status: DC | PRN

## 2021-08-06 MED ORDER — INSULIN ASPART 100 UNIT/ML IJ SOLN
0.0000 [IU] | Freq: Every day | INTRAMUSCULAR | Status: DC
Start: 2021-08-06 — End: 2021-08-08
  Administered 2021-08-06: 2 [IU] via SUBCUTANEOUS
  Filled 2021-08-06: qty 1

## 2021-08-06 MED ORDER — PROPOFOL 10 MG/ML IV BOLUS
INTRAVENOUS | Status: AC
  Filled 2021-08-06: qty 20

## 2021-08-06 MED ORDER — ENOXAPARIN SODIUM 40 MG/0.4ML IJ SOSY
40.0000 mg | PREFILLED_SYRINGE | INTRAMUSCULAR | Status: DC
  Administered 2021-08-07: 40 mg via SUBCUTANEOUS
  Filled 2021-08-06: qty 0.4

## 2021-08-06 MED ORDER — CHLORHEXIDINE GLUCONATE 0.12 % MT SOLN: 15.0000 mL | Freq: Once | OROMUCOSAL | Status: DC

## 2021-08-06 MED ORDER — CLINDAMYCIN PHOSPHATE 900 MG/50ML IV SOLN
900.0000 mg | INTRAVENOUS | Status: AC
  Administered 2021-08-06: 900 mg via INTRAVENOUS

## 2021-08-06 MED ORDER — ONDANSETRON HCL 4 MG/2ML IJ SOLN: 4.0000 mg | Freq: Four times a day (QID) | INTRAMUSCULAR | Status: DC | PRN

## 2021-08-06 MED ORDER — LIDOCAINE HCL (PF) 2 % IJ SOLN
INTRAMUSCULAR | Status: AC
  Filled 2021-08-06: qty 5

## 2021-08-06 MED ORDER — ACETAMINOPHEN 325 MG PO TABS: 650.0000 mg | ORAL_TABLET | Freq: Four times a day (QID) | ORAL | Status: DC | PRN

## 2021-08-06 MED ORDER — OXYCODONE HCL 5 MG PO TABS: 5.0000 mg | ORAL_TABLET | Freq: Once | ORAL | Status: DC | PRN

## 2021-08-06 MED ORDER — ONDANSETRON HCL 4 MG/2ML IJ SOLN
INTRAMUSCULAR | Status: AC
  Filled 2021-08-06: qty 2

## 2021-08-06 MED ORDER — EPINEPHRINE PF 1 MG/ML IJ SOLN
INTRAMUSCULAR | Status: AC
  Filled 2021-08-06: qty 1

## 2021-08-06 MED ORDER — DEXAMETHASONE SODIUM PHOSPHATE 10 MG/ML IJ SOLN
INTRAMUSCULAR | Status: DC | PRN
  Administered 2021-08-06: 4 mg via INTRAVENOUS

## 2021-08-06 MED ORDER — FENTANYL CITRATE (PF) 100 MCG/2ML IJ SOLN
INTRAMUSCULAR | Status: AC
  Filled 2021-08-06: qty 2

## 2021-08-06 MED ORDER — RINGERS IRRIGATION IR SOLN
Status: DC | PRN
  Administered 2021-08-06: 3000 mL

## 2021-08-06 MED ORDER — LIDOCAINE HCL (PF) 1 % IJ SOLN
INTRAMUSCULAR | Status: AC
  Filled 2021-08-06: qty 5

## 2021-08-06 MED ORDER — MIDAZOLAM HCL 2 MG/2ML IJ SOLN
INTRAMUSCULAR | Status: AC
  Filled 2021-08-06: qty 2

## 2021-08-06 MED ORDER — FENTANYL CITRATE PF 50 MCG/ML IJ SOSY
PREFILLED_SYRINGE | INTRAMUSCULAR | Status: AC
  Administered 2021-08-06: 50 ug via INTRAVENOUS
  Filled 2021-08-06: qty 1

## 2021-08-06 MED ORDER — BUPIVACAINE HCL (PF) 0.5 % IJ SOLN
INTRAMUSCULAR | Status: AC
  Filled 2021-08-06: qty 10

## 2021-08-06 MED ORDER — OXYCODONE HCL 5 MG/5ML PO SOLN: 5.0000 mg | Freq: Once | ORAL | Status: DC | PRN

## 2021-08-06 MED ORDER — INSULIN ASPART 100 UNIT/ML IJ SOLN
0.0000 [IU] | Freq: Three times a day (TID) | INTRAMUSCULAR | Status: DC
  Administered 2021-08-07: 3 [IU] via SUBCUTANEOUS
  Administered 2021-08-07: 2 [IU] via SUBCUTANEOUS
  Filled 2021-08-06 (×2): qty 1

## 2021-08-06 MED ORDER — POLYETHYLENE GLYCOL 3350 17 G PO PACK
17.0000 g | PACK | Freq: Every day | ORAL | Status: DC | PRN
  Filled 2021-08-06: qty 1

## 2021-08-06 MED ORDER — BUPIVACAINE LIPOSOME 1.3 % IJ SUSP
INTRAMUSCULAR | Status: AC
  Filled 2021-08-06: qty 20

## 2021-08-06 MED ORDER — CLINDAMYCIN PHOSPHATE 900 MG/50ML IV SOLN
INTRAVENOUS | Status: AC
  Filled 2021-08-06: qty 50

## 2021-08-06 MED ORDER — BUPIVACAINE HCL (PF) 0.5 % IJ SOLN
INTRAMUSCULAR | Status: DC | PRN
  Administered 2021-08-06: 3 mL via PERINEURAL
  Administered 2021-08-06: 7 mL via PERINEURAL

## 2021-08-06 MED ORDER — FENTANYL CITRATE (PF) 100 MCG/2ML IJ SOLN: 25.0000 ug | INTRAMUSCULAR | Status: DC | PRN

## 2021-08-06 MED ORDER — SUGAMMADEX SODIUM 200 MG/2ML IV SOLN
INTRAVENOUS | Status: DC | PRN
  Administered 2021-08-06: 200 mg via INTRAVENOUS

## 2021-08-06 MED ORDER — FENTANYL CITRATE (PF) 100 MCG/2ML IJ SOLN
INTRAMUSCULAR | Status: DC | PRN
  Administered 2021-08-06: 50 ug via INTRAVENOUS

## 2021-08-06 MED ORDER — LIDOCAINE HCL (CARDIAC) PF 100 MG/5ML IV SOSY
PREFILLED_SYRINGE | INTRAVENOUS | Status: DC | PRN
  Administered 2021-08-06: 80 mg via INTRAVENOUS

## 2021-08-06 MED ORDER — ROCURONIUM BROMIDE 100 MG/10ML IV SOLN
INTRAVENOUS | Status: DC | PRN
  Administered 2021-08-06: 30 mg via INTRAVENOUS
  Administered 2021-08-06: 50 mg via INTRAVENOUS

## 2021-08-06 MED ORDER — BUPIVACAINE-EPINEPHRINE (PF) 0.5% -1:200000 IJ SOLN
INTRAMUSCULAR | Status: AC
  Filled 2021-08-06: qty 30

## 2021-08-06 MED ORDER — CHLORHEXIDINE GLUCONATE 0.12 % MT SOLN
OROMUCOSAL | Status: AC
  Filled 2021-08-06: qty 15

## 2021-08-06 MED ORDER — MIDAZOLAM HCL 2 MG/2ML IJ SOLN: 1.0000 mg | INTRAMUSCULAR | Status: DC | PRN

## 2021-08-06 MED ORDER — AMLODIPINE BESYLATE 10 MG PO TABS: 10.0000 mg | ORAL_TABLET | ORAL | Status: DC

## 2021-08-06 MED ORDER — ATORVASTATIN CALCIUM 20 MG PO TABS: 20.0000 mg | ORAL_TABLET | Freq: Every evening | ORAL | Status: DC

## 2021-08-06 MED ORDER — EPHEDRINE 5 MG/ML INJ
INTRAVENOUS | Status: AC
  Filled 2021-08-06: qty 10

## 2021-08-06 MED ORDER — EMPAGLIFLOZIN 10 MG PO TABS: 10.0000 mg | ORAL_TABLET | Freq: Every day | ORAL | Status: DC

## 2021-08-06 MED ORDER — BUPIVACAINE LIPOSOME 1.3 % IJ SUSP
INTRAMUSCULAR | Status: DC | PRN
  Administered 2021-08-06: 13 mL via PERINEURAL
  Administered 2021-08-06: 7 mL via PERINEURAL

## 2021-08-06 MED ORDER — PHENYLEPHRINE HCL-NACL 20-0.9 MG/250ML-% IV SOLN
INTRAVENOUS | Status: DC | PRN
  Administered 2021-08-06: 50 ug/min via INTRAVENOUS

## 2021-08-06 MED ORDER — PHENYLEPHRINE HCL (PRESSORS) 10 MG/ML IV SOLN
INTRAVENOUS | Status: DC | PRN
  Administered 2021-08-06 (×3): 160 ug via INTRAVENOUS

## 2021-08-06 MED ORDER — LACTATED RINGERS IV SOLN
INTRAVENOUS | Status: DC | PRN
  Administered 2021-08-06: 1 mL

## 2021-08-06 MED ORDER — ACETAMINOPHEN 650 MG RE SUPP
650.0000 mg | Freq: Four times a day (QID) | RECTAL | Status: DC | PRN
  Filled 2021-08-06: qty 1

## 2021-08-06 MED ORDER — ONDANSETRON HCL 4 MG PO TABS: 4.0000 mg | ORAL_TABLET | Freq: Four times a day (QID) | ORAL | Status: DC | PRN

## 2021-08-06 MED ORDER — ROCURONIUM BROMIDE 10 MG/ML (PF) SYRINGE
PREFILLED_SYRINGE | INTRAVENOUS | Status: AC
  Filled 2021-08-06: qty 10

## 2021-08-06 SURGICAL SUPPLY — 51 items
ANCHOR ALL-SUT Q-FIX 2.8 (Anchor) ×2 IMPLANT
ANCHOR JUGGERKNOT WTAP NDL 2.9 (Anchor) ×2 IMPLANT
ANCHOR SUT QUATTRO KNTLS 4.5 (Anchor) ×1 IMPLANT
BIT DRILL JUGRKNT W/NDL BIT2.9 (DRILL) IMPLANT
BLADE FULL RADIUS 3.5 (BLADE) ×2 IMPLANT
BUR ACROMIONIZER 4.0 (BURR) ×2 IMPLANT
CANNULA SHAVER 8MMX76MM (CANNULA) ×2 IMPLANT
CHLORAPREP W/TINT 26 (MISCELLANEOUS) ×2 IMPLANT
COVER MAYO STAND REUSABLE (DRAPES) ×2 IMPLANT
DILATOR 5.5 THREADED HEALICOIL (MISCELLANEOUS) IMPLANT
DRAPE IMP U-DRAPE 54X76 (DRAPES) ×4 IMPLANT
DRILL JUGGERKNOT W/NDL BIT 2.9 (DRILL) ×2
ELECT CAUTERY BLADE 6.4 (BLADE) ×2 IMPLANT
ELECT REM PT RETURN 9FT ADLT (ELECTROSURGICAL) ×2
ELECTRODE REM PT RTRN 9FT ADLT (ELECTROSURGICAL) ×1 IMPLANT
GAUZE SPONGE 4X4 12PLY STRL (GAUZE/BANDAGES/DRESSINGS) ×2 IMPLANT
GAUZE XEROFORM 1X8 LF (GAUZE/BANDAGES/DRESSINGS) ×2 IMPLANT
GLOVE SRG 8 PF TXTR STRL LF DI (GLOVE) ×1 IMPLANT
GLOVE SURG ENC MOIS LTX SZ7.5 (GLOVE) ×4 IMPLANT
GLOVE SURG ENC MOIS LTX SZ8 (GLOVE) ×4 IMPLANT
GLOVE SURG UNDER LTX SZ8 (GLOVE) ×2 IMPLANT
GLOVE SURG UNDER POLY LF SZ8 (GLOVE) ×1
GOWN STRL REUS W/ TWL LRG LVL3 (GOWN DISPOSABLE) ×1 IMPLANT
GOWN STRL REUS W/ TWL XL LVL3 (GOWN DISPOSABLE) ×1 IMPLANT
GOWN STRL REUS W/TWL LRG LVL3 (GOWN DISPOSABLE) ×1
GOWN STRL REUS W/TWL XL LVL3 (GOWN DISPOSABLE) ×1
GRASPER SUT 15 45D LOW PRO (SUTURE) IMPLANT
IV LACTATED RINGER IRRG 3000ML (IV SOLUTION) ×2
IV LR IRRIG 3000ML ARTHROMATIC (IV SOLUTION) ×2 IMPLANT
KIT CANNULA 8X76-LX IN CANNULA (CANNULA) IMPLANT
KIT SUTURE 2.8 Q-FIX DISP (MISCELLANEOUS) ×1 IMPLANT
MANIFOLD NEPTUNE II (INSTRUMENTS) ×4 IMPLANT
MASK FACE SPIDER DISP (MASK) ×2 IMPLANT
MAT ABSORB  FLUID 56X50 GRAY (MISCELLANEOUS) ×1
MAT ABSORB FLUID 56X50 GRAY (MISCELLANEOUS) ×1 IMPLANT
PACK ARTHROSCOPY SHOULDER (MISCELLANEOUS) ×2 IMPLANT
PASSER SUT FIRSTPASS SELF (INSTRUMENTS) ×1 IMPLANT
SLING ARM LRG DEEP (SOFTGOODS) ×2 IMPLANT
SLING ULTRA II LG (MISCELLANEOUS) ×2 IMPLANT
SPONGE T-LAP 18X18 ~~LOC~~+RFID (SPONGE) ×2 IMPLANT
STAPLER SKIN PROX 35W (STAPLE) ×2 IMPLANT
STRAP SAFETY 5IN WIDE (MISCELLANEOUS) ×2 IMPLANT
SUT ETHIBOND 0 MO6 C/R (SUTURE) ×2 IMPLANT
SUT ULTRABRAID 2 COBRAID 38 (SUTURE) IMPLANT
SUT VIC AB 2-0 CT1 27 (SUTURE) ×2
SUT VIC AB 2-0 CT1 TAPERPNT 27 (SUTURE) ×2 IMPLANT
TAPE MICROFOAM 4IN (TAPE) ×2 IMPLANT
TUBING CONNECTING 10 (TUBING) ×2 IMPLANT
TUBING INFLOW SET DBFLO PUMP (TUBING) ×2 IMPLANT
WAND WEREWOLF FLOW 90D (MISCELLANEOUS) ×2 IMPLANT
WATER STERILE IRR 500ML POUR (IV SOLUTION) ×2 IMPLANT

## 2021-08-06 NOTE — H&P (Signed)
History and Physical    Cheryl Wallace VHQ:[id number] DOB: 12/02/1950 DOA: 08/06/2021  PCP: Crecencio Mc, MD   Patient coming from: Home  I have personally briefly reviewed patient's old medical records in [place]  Chief Complaint: Postoperative hypoxia  HPI: Cheryl Wallace is a 70 y.o. female with medical history significant of essential hypertension, type 2 diabetes mellitus, hyperlipidemia and arthropathy was came for outpatient procedure with orthopedic surgery for right shoulder arthroscopy with debridement, decompression, rotator cuff repair on 08/06/2021.  Patient developed hypoxia postoperatively, requiring 2 L of oxygen.  Decreased breath sounds on right.  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 Orthopedic order chest x-ray-pending.  They request overnight admission under observation to wean her off from oxygen. Her MRI of right shoulder showed full-thickness tear of the supraspinatus which required repair. She also has a long history of right shoulder pain.  Patient denies any significant postsurgical pain pain, no shortness of breath or chest pain.  Denies any recent illnesses.  No nausea or vomiting.  No recent change in urinary or bowel habits.  No urinary symptoms.  ED Course: Patient was seen in PACU.  Vital stable, desaturating in mid 80s on room air.  Saturation improved with 2 L of oxygen.  Recent preoperative labs with mild hypokalemia with potassium of 3.4, and A1c of 6.5.  Review of Systems: As per HPI otherwise 10 point review of systems negative.   Past Medical History:  Diagnosis Date   Acute meniscal tear, medial, right, sequela 09/22/2020   Allergic rhinitis    Anemia    iron deficiency   Arthritis    osteoarthritis right knee   Chronic kidney disease    nephropathy d/t diabetes   Complication of anesthesia 1981   While pt was under anesthesia, pt had an asthema attack during BTL due to having the flu    Depression, major, single episode, mild ([place]) 09/04/2016   Elbow tendonitis    a. bilat, s/p surgery.   Essential hypertension    Functional ovarian cysts 2011   a. s/p TAH (1994);  b. s/p oopherectomy (~2011)   Gallstones    GERD (gastroesophageal reflux disease)    History of kidney stones    Hyperlipidemia    Insomnia    Low grade fever    Obesity    Polyarthralgia    Seasonal allergies    Seizure ([place])    last episode at 14 years. nothing since. unknown etiology   Tinnitus    Type 2 diabetes mellitus ([place])    a.  Hemoglobin A1c in July 2016, 6.7.    Past Surgical History:  Procedure Laterality Date   ABDOMINAL HYSTERECTOMY  1994   COLONOSCOPY  05/2007   Non-bleeding internal hemorhoids identified.   COLONOSCOPY WITH PROPOFOL N/A 02/25/2020   Procedure: COLONOSCOPY WITH PROPOFOL;  Surgeon: Toledo, Benay Pike, MD;  Location: [facility];  Service: Gastroenterology;  Laterality: N/A;   ELBOW SURGERY Bilateral    d/t tendonitis   FRACTURE SURGERY Right    wrist/hand with plate   KNEE ARTHROSCOPY Right 11/18/2020   Procedure: Right knee arthroscopy, partial medial meniscectomy;  Surgeon: Hessie Knows, MD;  Location: [facility];  Service: Orthopedics;  Laterality: Right;   NASAL SINUS SURGERY  09/18/2015   Procedure: IMAGE GUIDED SINUS SURGERY, BILATERAL MAXILLARY BALLOON SINUPLASTY, BILATERAL FRONTAL BALLOON SINUPLASTY, RIGHT SPHENOID  SINUPLASTY, RIGHT CONCHABULLOSA RESECTION;  Surgeon: Carloyn Manner, MD;  Location: [facility];  Service: ENT;;   OOPHORECTOMY  04/2010   bilateral, 7 cm benign tumor left ovary   OPEN REDUCTION INTERNAL FIXATION (ORIF) DISTAL RADIAL FRACTURE Right 02/07/2017   Procedure: OPEN REDUCTION INTERNAL FIXATION (ORIF) DISTAL RADIAL FRACTURE;  Surgeon: Hessie Knows, MD;  Location: [facility];  Service: Orthopedics;  Laterality: Right;   PARTIAL HYSTERECTOMY  1994   still has ovaries, removed for fibroids   TONSILLECTOMY N/A 09/18/2015   Procedure:  TONSILLECTOMY;  Surgeon: Carloyn Manner, MD;  Location: [facility];  Service: ENT;  Laterality: N/A;   TUBAL LIGATION     UPPER GI ENDOSCOPY  2011     reports that she quit smoking about 36 years ago. Her smoking use included cigarettes. She has a 15.00 pack-year smoking history. She has never used smokeless tobacco. She reports that she does not currently use alcohol. She reports that she does not use drugs.  Allergies  Allergen Reactions   Shellfish Allergy Swelling    Swelling of face, lips and hands. No difficulty breathing. No problems with topical betadine   Lisinopril Cough    cough   Ozempic (0.25 Or 0.5 Mg-Dose) [Semaglutide(0.25 Or 0.5mg -Dos)] Nausea And Vomiting   Hctz [Hydrochlorothiazide]     Muscle cramps   Penicillins Rash    Family History  Problem Relation Age of Onset   Cancer Mother        colon   Tuberculosis Father    Diabetes Father    Heart disease Father        MI x 2   Heart attack Father    Diabetes Sister        half sister    Prior to Admission medications   Medication Sig Start Date End Date Taking? Authorizing Provider  acetaminophen (TYLENOL) 500 MG tablet Take 1,000 mg by mouth every 6 (six) hours as needed for mild pain or moderate pain.   Yes [provider]  aspirin 81 MG chewable tablet Chew 81 mg by mouth daily.   Yes [provider]  carvedilol (COREG) 3.125 MG tablet TAKE 1 TABLET (3.125 MG TOTAL) BY MOUTH 2 (TWO) TIMES DAILY WITH A MEAL. 06/23/21  Yes Crecencio Mc, MD  cholecalciferol (VITAMIN D) 1000 UNITS tablet Take 1,000 Units by mouth daily.    Yes [provider]  DULoxetine HCl 40 MG CPEP Take 40 mg by mouth at bedtime. 12/09/20  Yes [provider]  fexofenadine (ALLEGRA) 180 MG tablet Take 180 mg by mouth every morning.   Yes [provider]  fluticasone (FLONASE) 50 MCG/ACT nasal spray Place 2 sprays into both nostrils daily as needed for allergies. 09/29/20  Yes [provider]  glipiZIDE (GLUCOTROL) 10 MG tablet TAKE 1 TABLET (10 MG TOTAL) BY MOUTH 2 (TWO) TIMES DAILY BEFORE A MEAL. 06/25/21  Yes Crecencio Mc, MD  metFORMIN (GLUCOPHAGE) 1000 MG tablet TAKE 1 TABLET BY MOUTH 2 TIMES DAILY WITH A MEAL. 06/03/21  Yes Crecencio Mc, MD  omeprazole (PRILOSEC) 40 MG capsule Take 40 mg by mouth 2 (two) times daily. 07/03/20  Yes [provider]  oxyCODONE (ROXICODONE) 5 MG immediate release tablet Take 1-2 tablets (5-10 mg total) by mouth every 4 (four) hours as needed for moderate pain or severe pain. 08/06/21  Yes Poggi, Marshall Cork, MD  tiZANidine (ZANAFLEX) 2 MG tablet Take 2 mg by mouth at bedtime. 05/06/21  Yes [provider]  valsartan (DIOVAN) 40 MG tablet TAKE 1 TABLET BY MOUTH EVERYDAY AT BEDTIME  04/15/21  Yes Crecencio Mc, MD  zolpidem (AMBIEN) 5 MG tablet TAKE 1 TABLET BY MOUTH AT BEDTIME AS NEEDED FOR SLEEP. 08/02/21  Yes Crecencio Mc, MD  amLODipine (NORVASC) 10 MG tablet Take 1 tablet (10 mg total) by mouth every morning. TAKE 1 TABLET BY MOUTH EVERYDAY AT BEDTIME 08/06/21   Poggi, Marshall Cork, MD  atorvastatin (LIPITOR) 20 MG tablet Take 1 tablet (20 mg total) by mouth at bedtime. 08/06/21   Poggi, Marshall Cork, MD  diphenhydrAMINE (BENADRYL) 50 MG tablet Take 50 mg by mouth at bedtime.    [provider]  empagliflozin (JARDIANCE) 10 MG TABS tablet Take 1 tablet (10 mg total) by mouth at bedtime. 08/06/21   Corky Mull, MD    Physical Exam: Vitals:   08/06/21 1645 08/06/21 1650 08/06/21 1700 08/06/21 1702  BP: 125/61  133/64   Pulse: 79 85 92   Resp: (!) 24 (!) 21 20   Temp:   97.9 F (36.6 C)   SpO2: 95% (!) 89% (!) 77% 95%    General: Vital signs reviewed.  Patient is well-developed and well-nourished, in no acute distress and cooperative with exam.  Head: Normocephalic and atraumatic. Eyes: EOMI, conjunctivae normal, no scleral icterus.  ENMT: Mucous membranes are moist.  Neck: Supple, trachea midline, .   Cardiovascular: RRR, S1 normal, S2 normal, no murmurs, gallops, or rubs. Pulmonary/Chest: Decreased breath sounds on right, clear on left Abdominal: Soft, non-tender, non-distended, BS +,  Extremities: No lower extremity edema bilaterally,  pulses symmetric and intact bilaterally. Neurological: A&O x3, Strength is normal and symmetric bilaterally, cranial nerve II-XII are grossly intact, no focal motor deficit, sensory intact to light touch bilaterally.  Psychiatric: Normal mood and affect.   Labs on Admission: I have personally reviewed following labs and imaging studies  CBC: No results for input(s): WBC, NEUTROABS, HGB, HCT, MCV, PLT in the last 168 hours. Basic Metabolic Panel: No results for input(s): NA, K, CL, CO2, GLUCOSE, BUN, CREATININE, CALCIUM, MG, PHOS in the last 168 hours. GFR: Estimated Creatinine Clearance: 70.2 mL/min (by C-G formula based on SCr of 0.9 mg/dL). Liver Function Tests: No results for input(s): AST, ALT, ALKPHOS, BILITOT, PROT, ALBUMIN in the last 168 hours. No results for input(s): LIPASE, AMYLASE in the last 168 hours. No results for input(s): AMMONIA in the last 168 hours. Coagulation Profile: No results for input(s): INR, PROTIME in the last 168 hours. Cardiac Enzymes: No results for input(s): CKTOTAL, CKMB, CKMBINDEX, TROPONINI in the last 168 hours. BNP (last 3 results) No results for input(s): PROBNP in the last 8760 hours. HbA1C: No results for input(s): HGBA1C in the last 72 hours. CBG: Recent Labs  Lab 08/06/21 1240 08/06/21 1606  GLUCAP 153* 177*   Lipid Profile: No results for input(s): CHOL, HDL, LDLCALC, TRIG, CHOLHDL, LDLDIRECT in the last 72 hours. Thyroid Function Tests: No results for input(s): TSH, T4TOTAL, FREET4, T3FREE, THYROIDAB in the last 72 hours. Anemia Panel: No results for input(s): VITAMINB12, FOLATE, FERRITIN, TIBC, IRON, RETICCTPCT in the last 72 hours. Urine analysis:    Component Value Date/Time   COLORURINE  Yellow 04/15/2013 0018   APPEARANCEUR Hazy 04/15/2013 0018   LABSPEC 1.021 04/15/2013 0018   PHURINE 6.0 04/15/2013 0018   GLUCOSEU 150 mg/dL 04/15/2013 0018   HGBUR 1+ 04/15/2013 0018   BILIRUBINUR Negative 04/15/2013 0018   KETONESUR Negative 04/15/2013 0018   PROTEINUR 30 mg/dL 04/15/2013 0018   UROBILINOGEN 0.2 09/25/2012 0952   NITRITE Negative 04/15/2013  0018   LEUKOCYTESUR Negative 04/15/2013 0018    Radiological Exams on Admission: [place] OR NERVE BLOCK-IMAGE ONLY [facility])  Result Date: 08/06/2021 There is no interpretation for this exam.  This order is for images obtained during a surgical procedure.  Please See "Surgeries" Tab for more information regarding the procedure.    EKG: Independently reviewed.  Normal sinus rhythm, done on 07/29/2021 during preoperative clearance.  Assessment/Plan Active Problems:   * No active hospital problems. *   Acute hypoxic respiratory failure.  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  Chest x-ray ordered by orthopedic surgery.  No home oxygen requirement. Saturation improved with supplemental oxygen at 2 L. -Continue to monitor -Continue supplemental oxygen-wean as tolerated.  S/p right shoulder arthroscopy and rotator cuff repair. -Being managed by orthopedic surgery.  Hypertension.  Blood pressure within goal. -Can restart home meds if needed  Type 2 diabetes mellitus.  Seems well controlled with A1c of 6.1 on 07/29/2021 -SSI   DVT prophylaxis: Lovenox Code Status: Full code Family Communication:  Disposition Plan: Back to home environment Consults called: Orthopedic surgery Admission status: Observation   Lorella Nimrod MD [facility]  If 7PM-7AM, please contact night-coverage [email or web address]  08/06/2021, 5:13 PM   This record has been created using Systems analyst. Errors have been sought and corrected,but may not always be located. Such creation errors do not reflect  on the standard of care.

## 2021-08-06 NOTE — Transfer of Care (Signed)
Immediate Anesthesia Transfer of Care Note  Patient: Cheryl Wallace  Procedure(s) Performed: RIGHT SHOULDER ARTHROSCOPY WITH DEBRIDEMENT, DECOMPRESSION, AND POSSIBLE ROTATOR CUFF REPAIR (Right: Shoulder)  Patient Location: PACU  Anesthesia Type:General  Level of Consciousness: awake, alert  and oriented  Airway & Oxygen Therapy: Patient Spontanous Breathing and Patient connected to face mask oxygen  Post-op Assessment: Report given to RN and Post -op Vital signs reviewed and stable  Post vital signs: Reviewed and stable  Last Vitals:  Vitals Value Taken Time  BP 122/78 08/06/21 1605  Temp    Pulse 93 08/06/21 1607  Resp 24 08/06/21 1607  SpO2 93 % 08/06/21 1607  Vitals shown include unvalidated device data.  Last Pain: There were no vitals filed for this visit.       Complications: No notable events documented.

## 2021-08-06 NOTE — Anesthesia Preprocedure Evaluation (Addendum)
Anesthesia Evaluation  Patient identified by MRN, date of birth, ID band Patient awake    Reviewed: Allergy & Precautions, NPO status , Patient's Chart, lab work & pertinent test results  History of Anesthesia Complications (+) history of anesthetic complications  Airway Mallampati: III  TM Distance: <3 FB Neck ROM: full    Dental  (+) Chipped, Poor Dentition, Missing, Implants   Pulmonary neg shortness of breath, asthma , former smoker,    Pulmonary exam normal        Cardiovascular hypertension, (-) angina(-) Past MI and (-) DOE Normal cardiovascular exam     Neuro/Psych Seizures -, Well Controlled,  PSYCHIATRIC DISORDERS  Neuromuscular disease    GI/Hepatic Neg liver ROS, GERD  Medicated and Controlled,  Endo/Other  diabetes, Type 2  Renal/GU Renal disease     Musculoskeletal  (+) Arthritis ,   Abdominal   Peds  Hematology negative hematology ROS (+)   Anesthesia Other Findings Past Medical History: 09/22/2020: Acute meniscal tear, medial, right, sequela No date: Allergic rhinitis No date: Anemia     Comment:  iron deficiency No date: Arthritis     Comment:  osteoarthritis right knee No date: Chronic kidney disease     Comment:  nephropathy d/t diabetes 8676: Complication of anesthesia     Comment:  While pt was under anesthesia, pt had an asthema attack               during BTL due to having the flu 09/04/2016: Depression, major, single episode, mild ([facility]) No date: Elbow tendonitis     Comment:  a. bilat, s/p surgery. No date: Essential hypertension 2011: Functional ovarian cysts     Comment:  a. s/p TAH (1994);  b. s/p oopherectomy (~2011) No date: Gallstones No date: GERD (gastroesophageal reflux disease) No date: History of kidney stones No date: Hyperlipidemia No date: Insomnia No date: Low grade fever No date: Obesity No date: Polyarthralgia No date: Seasonal allergies No date: Seizure  [facility])     Comment:  last episode at 14 years. nothing since. unknown               etiology No date: Tinnitus No date: Type 2 diabetes mellitus ([place])     Comment:  a.  Hemoglobin A1c in July 2016, 6.7.  Past Surgical History: 1994: ABDOMINAL HYSTERECTOMY 05/2007: COLONOSCOPY     Comment:  Non-bleeding internal hemorhoids identified. 02/25/2020: COLONOSCOPY WITH PROPOFOL; N/A     Comment:  Procedure: COLONOSCOPY WITH PROPOFOL;  Surgeon: Toledo,               Benay Pike, MD;  Location: [facility];  Service:               Gastroenterology;  Laterality: N/A; No date: ELBOW SURGERY; Bilateral     Comment:  d/t tendonitis No date: FRACTURE SURGERY; Right     Comment:  wrist/hand with plate 11/18/2020: KNEE ARTHROSCOPY; Right     Comment:  Procedure: Right knee arthroscopy, partial medial               meniscectomy;  Surgeon: Hessie Knows, MD;  Location:               [facility] OR[facility];  Service: Orthopedics;  Laterality: Right; 09/18/2015: NASAL SINUS SURGERY     Comment:  Procedure: IMAGE GUIDED SINUS SURGERY, BILATERAL               MAXILLARY BALLOON SINUPLASTY, BILATERAL FRONTAL BALLOON  SINUPLASTY, RIGHT SPHENOID  SINUPLASTY, RIGHT               CONCHABULLOSA RESECTION;  Surgeon: Carloyn Manner, MD;               Location: [facility];  Service: ENT;; 04/2010: OOPHORECTOMY     Comment:  bilateral, 7 cm benign tumor left ovary 02/07/2017: OPEN REDUCTION INTERNAL FIXATION (ORIF) DISTAL RADIAL  FRACTURE; Right     Comment:  Procedure: OPEN REDUCTION INTERNAL FIXATION (ORIF)               DISTAL RADIAL FRACTURE;  Surgeon: Hessie Knows, MD;                Location: [facility] OR[facility];  Service: Orthopedics;  Laterality:               Right; 1994: PARTIAL HYSTERECTOMY     Comment:  still has ovaries, removed for fibroids 09/18/2015: TONSILLECTOMY; N/A     Comment:  Procedure: TONSILLECTOMY;  Surgeon: Carloyn Manner,               MD;  Location: [facility] ORS;  Service: ENT;  Laterality:  N/A; No date: TUBAL LIGATION 2011: UPPER GI ENDOSCOPY     Reproductive/Obstetrics negative OB ROS                             Anesthesia Physical Anesthesia Plan  ASA: 3  Anesthesia Plan: General ETT   Post-op Pain Management: Regional block   Induction: Intravenous  PONV Risk Score and Plan: Ondansetron, Dexamethasone, Midazolam and Treatment may vary due to age or medical condition  Airway Management Planned: Oral ETT  Additional Equipment:   Intra-op Plan:   Post-operative Plan: Extubation in OR  Informed Consent: I have reviewed the patients History and Physical, chart, labs and discussed the procedure including the risks, benefits and alternatives for the proposed anesthesia with the patient or authorized representative who has indicated his/her understanding and acceptance.     Dental Advisory Given  Plan Discussed with: Anesthesiologist, CRNA and Surgeon  Anesthesia Plan Comments: (Patient consented for risks of anesthesia including but not limited to:  - adverse reactions to medications - damage to eyes, teeth, lips or other oral mucosa - nerve damage due to positioning  - sore throat or hoarseness - Damage to heart, brain, nerves, lungs, other parts of body or loss of life  Patient voiced understanding.)       Anesthesia Quick Evaluation

## 2021-08-06 NOTE — Anesthesia Procedure Notes (Signed)
Procedure Name: Intubation Date/Time: 08/06/2021 2:16 PM Performed by: Tollie Eth, CRNA Pre-anesthesia Checklist: Patient identified, Patient being monitored, Timeout performed, Emergency Drugs available and Suction available Patient Re-evaluated:Patient Re-evaluated prior to induction Oxygen Delivery Method: Circle system utilized Preoxygenation: Pre-oxygenation with 100% oxygen Induction Type: IV induction Ventilation: Mask ventilation without difficulty Laryngoscope Size: 3 and McGraph Grade View: Grade I Tube type: Oral Tube size: 7.0 mm Number of attempts: 1 Airway Equipment and Method: Stylet Placement Confirmation: ETT inserted through vocal cords under direct vision, positive ETCO2 and breath sounds checked- equal and bilateral Secured at: 20 cm Tube secured with: Tape Dental Injury: Teeth and Oropharynx as per pre-operative assessment

## 2021-08-06 NOTE — Anesthesia Procedure Notes (Signed)
Anesthesia Regional Block: Interscalene brachial plexus block   Pre-Anesthetic Checklist: , timeout performed,  Correct Patient, Correct Site, Correct Laterality,  Correct Procedure, Correct Position, site marked,  Risks and benefits discussed,  Surgical consent,  Pre-op evaluation,  At surgeon's request and post-op pain management  Laterality: Upper and Right  Prep: chloraprep       Needles:  Injection technique: Single-shot  Needle Type: Stimiplex     Needle Length: 9cm  Needle Gauge: 22     Additional Needles:   Procedures:,,,, ultrasound used (permanent image in chart),,    Narrative:  Start time: 08/06/2021 1:15 PM End time: 08/06/2021 1:18 PM Injection made incrementally with aspirations every 5 mL.  Performed by: Personally  Anesthesiologist: Ryla Cauthon, Precious Haws, MD  Additional Notes: Patient consented for risk and benefits of nerve block including but not limited to nerve damage, failed block, bleeding and infection.  Patient voiced understanding.  Functioning IV was confirmed and monitors were applied.  Timeout done prior to procedure and prior to any sedation being given to the patient.  Patient confirmed procedure site prior to any sedation given to the patient.  A 60mm 22ga Stimuplex needle was used. Sterile prep,hand hygiene and sterile gloves were used.  Minimal sedation used for procedure.  No paresthesia endorsed by patient during the procedure.  Negative aspiration and negative test dose prior to incremental administration of local anesthetic. The patient tolerated the procedure well with no immediate complications.

## 2021-08-06 NOTE — H&P (Signed)
History of Present Illness:  Cheryl Wallace is a 70 y.o. female that presents to clinic today for her preoperative history and evaluation. Patient presents unaccompanied. The patient is scheduled to undergo a right shoulder arthroscopy with debridement, decompression, rotator cuff repair, and probable biceps tenodesis on 08/06/2021 by Dr. Roland Rack. The patient reports a long history of right shoulder pain dating back approximately 5 years following a fall resulting in her breaking her right wrist. Patient's symptoms began to worsen approximately 1 year ago. She has undergone subacromial injection as well as physical therapy without significant relief of her symptoms. MRI of the right shoulder showed full-thickness tear of the supraspinatus. The patient's symptoms have progressed to the point that they decrease her quality of life.   Patient has received all necessary clearances for surgery. Patient does report an allergy to penicillin, it causes a rash. Denies significant cardiac history, history of blood clots.   Patient is diabetic. Last A1c was 6.5 on 07/29/2021.  Past Medical History:   Depression   Diabetes mellitus type 2, uncomplicated (CMS-HCC)   GERD (gastroesophageal reflux disease)   Hyperlipidemia   Hypertension   Insomnia   Polyarthralgia   Past Surgical History:   COLONOSCOPY 05/12/2007, 12/27/2002 (Dr. Kennis Carina @ [facility] - PHPolyp(48mm), FHCC(m), rpt 5 yrs per RTE)   COLONOSCOPY 02/25/2020 (Normal colon/FHx CC/Repeat 47yrs/TKT)   EGD 05/12/2007 (Dr. Kennis Carina @ [facility])   elbow surgery   HYSTERECTOMY   OOPHORECTOMY   Current Medications:   acetaminophen (TYLENOL) 500 MG tablet Take 2 tablets (1,000 mg total) by mouth every 6 (six) hours as needed   amLODIPine (NORVASC) 5 MG tablet Take 5 mg by mouth once daily TAKE 1 TABLET BY MOUTH EVERY DAY   aspirin 81 MG chewable tablet Take 81 mg by mouth once daily.   atorvastatin (LIPITOR) 20 MG tablet Take 1 tablet by mouth once daily. 0    azelastine (ASTELIN) 137 mcg nasal spray Place 1 spray into both nostrils 2 (two) times daily 30 mL 1   carvediloL (COREG) 3.125 MG tablet Take 1 tablet (3.125 mg total) by mouth 2 (two) times daily   cholecalciferol (VITAMIN D3) 1,000 unit tablet Take by mouth.   diphenhydrAMINE (BENADRYL) 50 MG tablet Take 1 tablet (50 mg total) by mouth at bedtime   DULoxetine (CYMBALTA) 20 MG DR capsule Take 20 mg by mouth once daily   empagliflozin (JARDIANCE) 10 mg tablet Take 1 tablet by mouth once daily   fexofenadine (ALLEGRA) 180 MG tablet Take 1 tablet (180 mg total) by mouth once daily   fluticasone propionate (FLONASE) 50 mcg/actuation nasal spray Place 1 spray into both nostrils 2 (two) times daily 16 g 0   glipiZIDE (GLUCOTROL) 5 MG tablet   metFORMIN (GLUCOPHAGE) 1000 MG tablet TAKE 1 TABLET BY MOUTH 2 TIMES DAILY WITH A MEAL.   omeprazole (PRILOSEC) 40 MG DR capsule Take 1 capsule by mouth once daily   tiZANidine (ZANAFLEX) 2 MG tablet TAKE 1 TABLET BY MOUTH EVERY 6 HOURS AS NEEDED FOR MUSCLE SPASMS.   valsartan (DIOVAN) 40 MG tablet Take by mouth Take 1 tablet (40 mg total) by mouth at bedtime   zolpidem (AMBIEN) 5 MG tablet   ferrous fumarate-iron polysaccharide complex (TANDEM DUAL ACTION) 162-115.2 (106) mg Cap per capsule Take 1 capsule by mouth daily with breakfast (Patient not taking: Reported on 08/04/2021)   Allergies:   Lisinopril Other (See Comments) and Cough   Shellfish Containing Products (Swelling of face and hands)  No difficulty breathing. No problems with topical betadine   Penicillin Rash   Social History:   Socioeconomic History   Marital status: Divorced  Tobacco Use   Smoking status: Former   Smokeless tobacco: Never  Scientific laboratory technician Use: Never used  Substance and Sexual Activity   Alcohol use: No  Alcohol/week: 0.0 standard drinks   Drug use: No   Sexual activity: Defer   Family History:   Cancer Mother   Colon cancer Mother   Diabetes type II Father    Review of Systems:  A 10+ ROS was performed, reviewed, and the pertinent orthopaedic findings are documented in the HPI.   Physical Examination:  BP (!) 162/84  Ht 165.1 cm (5\' 5" )  Wt (!) 102.8 kg (226 lb 9.6 oz)  BMI 37.71 kg/m   Patient is a well-developed, well-nourished female in no acute distress. Patient has normal mood and affect. Patient is alert and oriented to person, place, and time.   HEENT: Atraumatic, normocephalic. Pupils equal and reactive to light. Extraocular motion intact. Noninjected sclera.  Cardiovascular: Regular rate and rhythm, with no murmurs, rubs, or gallops. Radial pulse 2+  Respiratory: Lungs clear to auscultation bilaterally.   Right shoulder exam: SKIN: normal SWELLING: none WARMTH: none LYMPH NODES: no adenopathy palpable CREPITUS: none TENDERNESS: Mildly tender over anterolateral shoulder ROM (active):  Forward flexion: 110 degrees Abduction: 90 degrees Internal rotation: L1 ROM (passive):  Forward flexion: 155 degrees Abduction: 150 degrees  ER/IR at 90 abd: 90 degrees / 70 degrees   She has mild-moderate pain at the extremes of all motions.   STRENGTH: Forward flexion: 3+/5 Abduction: 3+/5 External rotation: 4/5 Internal rotation: 4-4+/5 Pain with RC testing: Moderate pain with resisted forward flexion and abduction, and mild pain with resisted external rotation.   STABILITY: Normal   SPECIAL TESTS: Luan Pulling' test: positive, moderate Speed's test: positive Capsulitis - pain w/ passive ER: no Crossed arm test: Mildly positive Crank: Not evaluated Anterior apprehension: Negative Posterior apprehension: Not evaluated   She is neurovascularly intact to the right upper extremity.  Sensation is intact over the median, radial, ulnar, and axillary nerve distributions. Patient able to make an OK sign, thumbs up, and criss-cross the 2nd and 3rd digits.   Right Shoulder Imaging, MRI: MRI Shoulder Cartilage: Mild "thinning" of  articular surfaces, but no effusion. MRI Shoulder Rotator Cuff: Full thickness tear of the supraspinatus. Retracted to the humeral head. MRI Shoulder Labrum / Biceps: Possible superior labral degenerative tearing, but no tearing of biceps tendon. MRI Shoulder Bone: Normal bone.  Both the films and report were reviewed by myself and discussed with the patient.   Impression:  1. Rotator cuff tendinitis, right.  2. Traumatic complete tear of right rotator cuff.   Plan:  The treatment options, including both surgical and nonsurgical choices, have been discussed in detail with the patient and his/her family. The risks (including bleeding, infection, nerve and/or blood vessel injury, persistent or recurrent pain, loosening or failure of the components, leg length inequality, dislocation, need for further surgery, blood clots, strokes, heart attacks or arrhythmias, pneumonia, etc.) and benefits of the surgical procedure were discussed. The patient states his/her understanding and agrees to proceed. @CAPHE @ agrees to a blood transfusion if necessary. A formal written consent will be obtained by the nursing staff.   H&P reviewed and patient re-examined. No changes.

## 2021-08-06 NOTE — Op Note (Signed)
08/06/2021  3:57 PM  Patient:   Cheryl Wallace  Pre-Op Diagnosis:   Impingement/tendinopathy with full-thickness rotator cuff tear, right shoulder.  Post-Op Diagnosis:   Impingement/tendinopathy with thickness rotator cuff tear and biceps tendinopathy, right shoulder.  Procedure:   Limited arthroscopic debridement, arthroscopic subacromial decompression, mini-open rotator cuff repair, and mini-open biceps tenodesis, right shoulder.  Anesthesia:   General endotracheal with interscalene block using Exparel placed preoperatively by the anesthesiologist.  Surgeon:   Pascal Lux, MD  Assistant:   Cameron Proud, PA-C   Findings:   As above. There was was mild labral fraying superiorly, as well as grade I-II chondromalacia involving the central portion of the glenoid and grade I chondromalacial changes involving the central portion of the humeral head. There was tear involving the mid and anterior insertional fibers of the supraspinatus tendon, as well as a small partial-thickness tear involving the superior fibers of the subscapularis tendon without compromise of the footprint on the lesser tuberosity. The remaining portions of the tendon in satisfactory condition. There was moderate "lip sticking" of the biceps tendon without partial or full-thickness tearing.  Complications:   None  Fluids:   1200 cc  Estimated blood loss:   10 cc  Tourniquet time:   None  Drains:   None  Closure:   Staples      Brief clinical note:   The patient is a 70 year old female with a history of right shoulder pain following a fall onto her outstretched right hand 5 years ago. The patient's symptoms have progressed despite medications, activity modification, etc. The patient's history and examination are consistent with impingement/tendinopathy with a rotator cuff tear. These findings were confirmed by MRI scan. The patient presents at this time for definitive management of these shoulder  symptoms.  Procedure:   The patient underwent placement of an interscalene block by the anesthesiologist in the preoperative holding area before being brought into the operating room and lain in the supine position. The patient then underwent general endotracheal intubation and anesthesia before being repositioned in the beach chair position using the beach chair positioner. The right shoulder and upper extremity were prepped with ChloraPrep solution before being draped sterilely. Preoperative antibiotics were administered. A timeout was performed to confirm the proper surgical site before the expected portal sites and incision site were injected with 0.5% Sensorcaine with epinephrine.   A posterior portal was created and the glenohumeral joint thoroughly inspected with the findings as described above. An anterior portal was created using an outside-in technique. The labrum and rotator cuff were further probed, again confirming the above-noted findings. The areas of labral fraying as well as the articular sided fraying involving the subscapularis tendon were debrided back to stable margins using the full-radius resector. The ArthroCare wand was inserted and used to release the biceps tendon from its labral anchor. It also was used to obtain hemostasis as well as to "anneal" the labrum superiorly and anteriorly. The instruments were removed from the joint after suctioning the excess fluid.  The camera was repositioned through the posterior portal into the subacromial space. A separate lateral portal was created using an outside-in technique. The 3.5 mm full-radius resector was introduced and used to perform a subtotal bursectomy. The ArthroCare wand was then inserted and used to remove the periosteal tissue off the undersurface of the anterior third of the acromion as well as to recess the coracoacromial ligament from its attachment along the anterior and lateral margins of the acromion. The 4.0 mm  acromionizing bur was introduced and used to complete the decompression by removing the undersurface of the anterior third of the acromion. The full radius resector was reintroduced to remove any residual bony debris before the ArthroCare wand was reintroduced to obtain hemostasis. The instruments were then removed from the subacromial space after suctioning the excess fluid.  An approximately 4-5 cm incision was made over the anterolateral aspect of the shoulder beginning at the anterolateral corner of the acromion and extending distally in line with the bicipital groove. This incision was carried down through the subcutaneous tissues to expose the deltoid fascia. The raphae between the anterior and middle thirds was identified and this plane developed to provide access into the subacromial space. Additional bursal tissues were debrided sharply using Metzenbaum scissors. The rotator cuff tear was readily identified. The margins were debrided sharply with a #15 blade and the exposed greater tuberosity roughened with a rongeur. The tear was repaired using two [employer] 2.8 mm Q-Fix anchors. These sutures were then brought back laterally and secured using a single [employer] anchor to create a two-layer closure. An apparent watertight closure was obtained.  The bicipital groove was identified by palpation and opened for 1-1.5 cm. The biceps tendon stump was retrieved through this defect. The floor of the bicipital groove was roughened with a curet before a Biomet 2.9 mm JuggerKnot anchor was inserted. Both sets of sutures were passed through the biceps tendon and tied securely to effect the tenodesis. The bicipital sheath was reapproximated using two #0 Ethibond interrupted sutures, incorporating the biceps tendon to further reinforce the tenodesis.  The wound was copiously irrigated with sterile saline solution before the deltoid raphae was reapproximated using 2-0 Vicryl interrupted sutures. The  subcutaneous tissues were closed in two layers using 2-0 Vicryl interrupted sutures before the skin was closed using staples. The portal sites also were closed using staples. A sterile bulky dressing was applied to the shoulder before the arm was placed into a shoulder immobilizer. The patient was then awakened, extubated, and returned to the recovery room in satisfactory condition after tolerating the procedure well.

## 2021-08-06 NOTE — Progress Notes (Signed)
1622- pt sats low 80s RA.  Placed on [place].  Breath sounds clear on left and significantly diminished on right.  No wheezing. No dyspnea. Sats increased; on 3 L [place]  1637- stats in 70s on RA getting up to recliner.  Incentive spirometry done; pt only able to achieve 500-625 ml. Placed back on 2 L [place].    1650- placed on room air to see how sats respond.    1700- while standing in front of chair sats decreased to 72 % RA. Pt placed back on oxygen.  Dr Andree Elk notfiied.  CXR and hospitalist consult placed. Dr Roland Rack notified pt will be staying overnight r/t hypoxia.  Dr Andree Elk to bedside to see pt.  Will continue to monitor.

## 2021-08-06 NOTE — Discharge Instructions (Addendum)
Orthopedic discharge instructions: Keep dressing dry and intact.  May shower after dressing changed on post-op day #4 (Monday).  Cover staples with Band-Aids after drying off. Apply ice frequently to shoulder. Take oxycodone as prescribed when needed.  May supplement with ES Tylenol if necessary. Keep shoulder immobilizer on at all times except may remove for bathing purposes. Follow-up in 10-14 days or as scheduled.  AMBULATORY SURGERY  DISCHARGE INSTRUCTIONS   The drugs that you were given will stay in your system until tomorrow so for the next 24 hours you should not:  Drive an automobile Make any legal decisions Drink any alcoholic beverage   You may resume regular meals tomorrow.  Today it is better to start with liquids and gradually work up to solid foods.  You may eat anything you prefer, but it is better to start with liquids, then soup and crackers, and gradually work up to solid foods.   Please notify your doctor immediately if you have any unusual bleeding, trouble breathing, redness and pain at the surgery site, drainage, fever, or pain not relieved by medication.    Your post-operative visit with Dr.                                       is: Date:                        Time:    Please call to schedule your post-operative visit.  Additional Instructions: LEAVE GREEN ARMBAND ON FOR 4 DAYS

## 2021-08-07 ENCOUNTER — Encounter: Payer: Self-pay | Admitting: Internal Medicine

## 2021-08-07 DIAGNOSIS — Z01818 Encounter for other preprocedural examination: Secondary | ICD-10-CM

## 2021-08-07 DIAGNOSIS — E1169 Type 2 diabetes mellitus with other specified complication: Secondary | ICD-10-CM | POA: Diagnosis not present

## 2021-08-07 DIAGNOSIS — M75121 Complete rotator cuff tear or rupture of right shoulder, not specified as traumatic: Secondary | ICD-10-CM | POA: Diagnosis not present

## 2021-08-07 DIAGNOSIS — R0902 Hypoxemia: Secondary | ICD-10-CM

## 2021-08-07 DIAGNOSIS — E785 Hyperlipidemia, unspecified: Secondary | ICD-10-CM

## 2021-08-07 DIAGNOSIS — J9601 Acute respiratory failure with hypoxia: Secondary | ICD-10-CM | POA: Diagnosis not present

## 2021-08-07 LAB — BASIC METABOLIC PANEL
Anion gap: 5 (ref 5–15)
BUN: 13 mg/dL (ref 8–23)
CO2: 25 mmol/L (ref 22–32)
Calcium: 8.2 mg/dL — ABNORMAL LOW (ref 8.9–10.3)
Chloride: 109 mmol/L (ref 98–111)
Creatinine, Ser: 0.83 mg/dL (ref 0.44–1.00)
GFR, Estimated: >60 mL/min (ref >60)
Glucose, Bld: 203 mg/dL — ABNORMAL HIGH (ref 70–99)
Potassium: 3.6 mmol/L (ref 3.5–5.1)
Sodium: 139 mmol/L (ref 135–145)

## 2021-08-07 LAB — CBC
HCT: 36.5 % (ref 36.0–46.0)
Hemoglobin: 11.1 g/dL — ABNORMAL LOW (ref 12.0–15.0)
MCH: 24.9 pg — ABNORMAL LOW (ref 26.0–34.0)
MCHC: 30.4 g/dL (ref 30.0–36.0)
MCV: 81.8 fL (ref 80.0–100.0)
Platelets: 253 10*3/uL (ref 150–400)
RBC: 4.46 MIL/uL (ref 3.87–5.11)
RDW: 20.7 % — ABNORMAL HIGH (ref 11.5–15.5)
WBC: 9.5 10*3/uL (ref 4.0–10.5)
nRBC: 0 % (ref 0.0–0.2)

## 2021-08-07 LAB — GLUCOSE, CAPILLARY
Glucose-Capillary: 135 mg/dL — ABNORMAL HIGH (ref 70–99)
Glucose-Capillary: 177 mg/dL — ABNORMAL HIGH (ref 70–99)

## 2021-08-07 MED ORDER — METOCLOPRAMIDE HCL 5 MG/ML IJ SOLN: 5.0000 mg | Freq: Three times a day (TID) | INTRAMUSCULAR | Status: DC | PRN

## 2021-08-07 MED ORDER — SODIUM CHLORIDE 0.9 % IV SOLN: INTRAVENOUS | Status: DC

## 2021-08-07 MED ORDER — FUROSEMIDE 10 MG/ML IJ SOLN
40.0000 mg | INTRAMUSCULAR | Status: AC
  Administered 2021-08-07: 40 mg via INTRAVENOUS
  Filled 2021-08-07: qty 4

## 2021-08-07 MED ORDER — METOCLOPRAMIDE HCL 10 MG PO TABS: 5.0000 mg | ORAL_TABLET | Freq: Three times a day (TID) | ORAL | Status: DC | PRN

## 2021-08-07 NOTE — Progress Notes (Signed)
SATURATION QUALIFICATIONS: (This note is used to comply with regulatory documentation for home oxygen)  Patient Saturations on Room Air at Rest = 89%  Patient Saturations on Room Air while Ambulating = 87%  Patient Saturations on 2 Liters of oxygen while Ambulating = 95%  Please briefly explain why patient needs home oxygen:Patient experiencing hypoxia while ambulating w/o oxygen.

## 2021-08-07 NOTE — Progress Notes (Signed)
SATURATION QUALIFICATIONS: (This note is used to comply with regulatory documentation for home oxygen)  Patient Saturations on Room Air at Rest = 90%  Patient Saturations on Room Air while Ambulating = 85%  Patient Saturations on 2 Liters of oxygen while Ambulating = 95%  Please briefly explain why patient needs home oxygen:patient experiencing hypoxia while ambulating w/o oxygen.

## 2021-08-07 NOTE — Anesthesia Postprocedure Evaluation (Signed)
Anesthesia Post Note  Patient: LYRIK DOCKSTADER  Procedure(s) Performed: RIGHT SHOULDER ARTHROSCOPY WITH DEBRIDEMENT, DECOMPRESSION, AND POSSIBLE ROTATOR CUFF REPAIR (Right: Shoulder)  Patient location during evaluation: PACU Anesthesia Type: General Level of consciousness: awake and alert Pain management: pain level controlled Vital Signs Assessment: post-procedure vital signs reviewed and stable Respiratory status: spontaneous breathing, nonlabored ventilation, respiratory function stable and patient connected to nasal cannula oxygen Cardiovascular status: blood pressure returned to baseline and stable Postop Assessment: no apparent nausea or vomiting Anesthetic complications: no   No notable events documented.   Last Vitals:  Vitals:   08/07/21 0014 08/07/21 0527  BP: 138/67 139/69  Pulse: 85 76  Resp: 18 17  Temp: 36.7 C 36.5 C  SpO2: 94% 96%    Last Pain:  Vitals:   08/07/21 0024  PainSc: 0-No pain                 Precious Haws Madesyn Ast

## 2021-08-07 NOTE — Progress Notes (Signed)
Subjective: 1 Day Post-Op Procedure(s) (LRB): RIGHT SHOULDER ARTHROSCOPY WITH DEBRIDEMENT, DECOMPRESSION, AND POSSIBLE ROTATOR CUFF REPAIR (Right) Patient reports pain as mild in the right shoulder.  Block still in effect from yesterday. Patient is  well but experienced hypoxia yesterday requiring admission. Plan is to go Home after hospital stay. Negative for chest pain and shortness of breath Fever: no Gastrointestinal:Negative for nausea and vomiting  Objective: Vital signs in last 24 hours: Temp:  [97 F (36.1 C)-98.5 F (36.9 C)] 97.7 F (36.5 C) (12/02 0527) Pulse Rate:  [73-92] 73 (12/02 0740) Resp:  [17-25] 19 (12/02 0740) BP: (122-156)/(59-118) 155/70 (12/02 0740) SpO2:  [77 %-100 %] 92 % (12/02 0740)  Intake/Output from previous day:  Intake/Output Summary (Last 24 hours) at 08/07/2021 0804 Last data filed at 08/06/2021 1544 Gross per 24 hour  Intake 1250 ml  Output 10 ml  Net 1240 ml    Intake/Output this shift: No intake/output data recorded.  Labs: Recent Labs    08/06/21 1858 08/07/21 0224  HGB 11.6* 11.1*   Recent Labs    08/06/21 1858 08/07/21 0224  WBC 8.8 9.5  RBC 4.57 4.46  HCT 37.6 36.5  PLT 259 253   Recent Labs    08/06/21 1858 08/07/21 0224  NA  --  139  K  --  3.6  CL  --  109  CO2  --  25  BUN  --  13  CREATININE 0.73 0.83  GLUCOSE  --  203*  CALCIUM  --  8.2*   No results for input(s): LABPT, INR in the last 72 hours.  EXAM General - Patient is Alert, Appropriate, and Oriented Decreased breath sounds on right, clear lungs to auscultation to the left Extremity - ABD soft Reports intact sensation to the right arm, over the dorsal and volar forearm. Able to flex and extend wrist without issues.  Normal flexion and extension of the fingers. Cap refill intact.  Bulky dressing is intact to the right shoulder Motor Function - intact, moving foot and toes well on exam.   Past Medical History:  Diagnosis Date   Acute meniscal  tear, medial, right, sequela 09/22/2020   Allergic rhinitis    Anemia    iron deficiency   Arthritis    osteoarthritis right knee   Chronic kidney disease    nephropathy d/t diabetes   Complication of anesthesia 1981   While pt was under anesthesia, pt had an asthema attack during BTL due to having the flu   Depression, major, single episode, mild ([place]) 09/04/2016   Elbow tendonitis    a. bilat, s/p surgery.   Essential hypertension    Functional ovarian cysts 2011   a. s/p TAH (1994);  b. s/p oopherectomy (~2011)   Gallstones    GERD (gastroesophageal reflux disease)    History of kidney stones    Hyperlipidemia    Insomnia    Low grade fever    Obesity    Polyarthralgia    Seasonal allergies    Seizure ([place])    last episode at 14 years. nothing since. unknown etiology   Tinnitus    Type 2 diabetes mellitus ([place])    a.  Hemoglobin A1c in July 2016, 6.7.   Assessment/Plan: 1 Day Post-Op Procedure(s) (LRB): RIGHT SHOULDER ARTHROSCOPY WITH DEBRIDEMENT, DECOMPRESSION, AND POSSIBLE ROTATOR CUFF REPAIR (Right) Principal Problem:   Acute respiratory failure with hypoxia (HCC)  Estimated body mass index is 36.78 kg/m as calculated from the following:   Height  as of this encounter: 5\' 5"  (1.651 m).   Weight as of 07/29/21: 100.2 kg. Advance diet Up with therapy D/C IV fluids when tolerating po intake.  Reports no significant pain in the right shoulder. On 1L of O2 this morning, continue to wean off of O2. Can discharge home today after weaning off of supplemental O2.  DVT Prophylaxis - Lovenox and Foot Pumps Non-weightbearing to the right arm.  Raquel Joseangel [place], PA-C [facility] Orthopaedic Surgery 08/07/2021, 8:04 AM

## 2021-08-07 NOTE — Plan of Care (Signed)
Patient discharged home per MD orders at this time.All discharge instructions,education and medications reviewed with the patient at the bedside.Pt expressed understanding and will comply with dc instructions.follow up appointments was also communicated to the patient.no verbal c/o or any ssx of distress.patient was discharged home with [facility] and home oxygen per order.patient was transported home by daughter in a privately own vehicle.

## 2021-08-07 NOTE — Plan of Care (Signed)

## 2021-08-07 NOTE — TOC Progression Note (Signed)
Transition of Care [facility]) - Progression Note    Patient Details  Name: Cheryl Wallace MRN: [id number] Date of Birth: 03/30/1951  Transition of Care [facility]) CM/SW [place], RN Phone Number: 08/07/2021, 1:41 PM  Clinical Narrative:   Met with the patient and her daughter in the room at the bedside to discuss DC plan and needs  She lives at home alone but her daughter will be helping her She has transportation with her daughter, she can afford her medication  She will need Home oxygen , I notified [employer], it will be self pay at $150.00 per month, the patient is agreeable, will be delivered to the room prior to DC, no additional needs        Expected Discharge Plan and Services           Expected Discharge Date: 08/07/21                                     Social Determinants of Health (SDOH) Interventions    Readmission Risk Interventions No flowsheet data found.

## 2021-08-08 ENCOUNTER — Encounter: Payer: Self-pay | Admitting: Internal Medicine

## 2021-08-08 DIAGNOSIS — M75121 Complete rotator cuff tear or rupture of right shoulder, not specified as traumatic: Secondary | ICD-10-CM | POA: Diagnosis not present

## 2021-08-09 NOTE — Discharge Summary (Signed)
Physician Discharge Summary   Patient name: Cheryl Wallace  Admit date:     08/06/2021  Discharge date: 08/07/2021  Discharge Physician: Max Sane   PCP: Crecencio Mc, MD   Recommendations at discharge: f/up with outpt providers as requested  Discharge Diagnoses Principal Problem:   Acute respiratory failure with hypoxia [facility]) Active Problems:   Preop examination   Hypoxia   Hospital Course    70 y.o. female with medical history significant of essential hypertension, type 2 diabetes mellitus, hyperlipidemia and arthropathy was came for outpatient procedure with orthopedic surgery for right shoulder arthroscopy with debridement, decompression, rotator cuff repair on 08/06/2021.  Patient developed hypoxia postoperatively, requiring 2 L of oxygen.  Decreased breath sounds on right.  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   Acute hypoxic respiratory failure.  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 at D/C and is et up by [facility]   S/p right shoulder arthroscopy and rotator cuff repair. -outpt orthopedic surgery f/up   Hypertension.  Blood pressure within goal.   Type 2 diabetes mellitus.  Seems well controlled with A1c of 6.1 on 07/29/2021 -SSI while in the hospital    Condition at discharge: good  Exam Physical Exam   Head: Normocephalic and atraumatic. Eyes: EOMI, conjunctivae normal, no scleral icterus.  ENMT: Mucous membranes are moist.  Neck: Supple, trachea midline, .  Cardiovascular: RRR, S1 normal, S2 normal, no murmurs, gallops, or rubs. Pulmonary/Chest: Decreased breath sounds on right, clear on left Abdominal: Soft, non-tender, non-distended, BS +,  Extremities: RUE in sling from recent surgery Neurological: A&O x3, Strength is normal and symmetric bilaterally, cranial nerve II-XII are grossly intact, no focal  motor deficit, sensory intact to light touch bilaterally.  Psychiatric: Normal mood and affect.   Disposition: Home  Discharge time: greater than 30 minutes.  Follow-up Information     Lattie Corns, PA-C Follow up on 08/17/2021.   Specialty: Physician Assistant Why: 08/11/21- physical therapy already scheduled  08/17/21- post op appointment already scheduled Contact information: [place] [address] [phone]         Crecencio Mc, MD. Schedule an appointment as soon as possible for a visit in 3 day(s).   Specialty: Internal Medicine Why: [facility] Discharge F/UP Contact information: [place] [address] [phone]                 Allergies as of 08/08/2021       Reactions   Shellfish Allergy Swelling   Swelling of face, lips and hands. No difficulty breathing. No problems with topical betadine   Lisinopril Cough   cough   Ozempic (0.25 Or 0.5 Mg-dose) [semaglutide(0.25 Or 0.5mg -dos)] Nausea And Vomiting   Hctz [hydrochlorothiazide]    Muscle cramps   Penicillins Rash        Medication List     TAKE these medications    acetaminophen 500 MG tablet Commonly known as: TYLENOL Take 1,000 mg by mouth every 6 (six) hours as needed for mild pain or moderate pain.   amLODipine 10 MG tablet Commonly known as: NORVASC Take 1 tablet (10 mg total) by mouth every morning. TAKE 1 TABLET BY MOUTH EVERYDAY AT BEDTIME   aspirin 81 MG chewable tablet Chew 81 mg by mouth daily.   atorvastatin  20 MG tablet Commonly known as: LIPITOR Take 1 tablet (20 mg total) by mouth at bedtime.   carvedilol 3.125 MG tablet Commonly known as: COREG TAKE 1 TABLET (3.125 MG TOTAL) BY MOUTH 2 (TWO) TIMES DAILY WITH A MEAL.   cholecalciferol 1000 units tablet Commonly known as: VITAMIN D Take 1,000 Units by mouth daily.   diphenhydrAMINE 50 MG tablet Commonly known as:  BENADRYL Take 50 mg by mouth at bedtime.   DULoxetine HCl 40 MG Cpep Take 40 mg by mouth at bedtime.   empagliflozin 10 MG Tabs tablet Commonly known as: Jardiance Take 1 tablet (10 mg total) by mouth at bedtime. What changed:  how much to take when to take this   fexofenadine 180 MG tablet Commonly known as: ALLEGRA Take 180 mg by mouth every morning.   fluticasone 50 MCG/ACT nasal spray Commonly known as: FLONASE Place 2 sprays into both nostrils daily as needed for allergies.   glipiZIDE 10 MG tablet Commonly known as: GLUCOTROL TAKE 1 TABLET (10 MG TOTAL) BY MOUTH 2 (TWO) TIMES DAILY BEFORE A MEAL.   metFORMIN 1000 MG tablet Commonly known as: GLUCOPHAGE TAKE 1 TABLET BY MOUTH 2 TIMES DAILY WITH A MEAL.   omeprazole 40 MG capsule Commonly known as: PRILOSEC Take 40 mg by mouth 2 (two) times daily.   oxyCODONE 5 MG immediate release tablet Commonly known as: Roxicodone Take 1-2 tablets (5-10 mg total) by mouth every 4 (four) hours as needed for moderate pain or severe pain.   tiZANidine 2 MG tablet Commonly known as: ZANAFLEX Take 2 mg by mouth at bedtime.   valsartan 40 MG tablet Commonly known as: DIOVAN TAKE 1 TABLET BY MOUTH EVERYDAY AT BEDTIME   zolpidem 5 MG tablet Commonly known as: AMBIEN TAKE 1 TABLET BY MOUTH AT BEDTIME AS NEEDED FOR SLEEP.        DG Chest Port 1 View  Result Date: 08/06/2021 CLINICAL DATA:  Hypoxia EXAM: PORTABLE CHEST 1 VIEW COMPARISON:  03/06/2020, FINDINGS: There is elevation of the right diaphragm. Borderline cardiomegaly. No pleural effusion or pneumothorax. Postsurgical changes over the right shoulder. IMPRESSION: Elevation of right diaphragm is new as compared with the 2021 comparison. There is no focal airspace disease. Electronically Signed   By: Donavan Foil M.D.   On: 08/06/2021 18:04   [place] OR NERVE BLOCK-IMAGE ONLY [facility])  Result Date: 08/06/2021 There is no interpretation for this exam.  This order is for images  obtained during a surgical procedure.  Please See "Surgeries" Tab for more information regarding the procedure.   Results for orders placed or performed during the hospital encounter of 11/14/20  SARS CORONAVIRUS 2 (TAT 6-24 HRS) Nasopharyngeal Nasopharyngeal Swab     Status: None   Collection Time: 11/14/20 10:40 AM   Specimen: Nasopharyngeal Swab  Result Value Ref Range Status   SARS Coronavirus 2 NEGATIVE NEGATIVE Final    Comment: (NOTE) SARS-CoV-2 target nucleic acids are NOT DETECTED.  The SARS-CoV-2 RNA is generally detectable in upper and lower respiratory specimens during the acute phase of infection. Negative results do not preclude SARS-CoV-2 infection, do not rule out co-infections with other pathogens, and should not be used as the sole basis for treatment or other patient management decisions. Negative results must be combined with clinical observations, patient history, and epidemiological information. The expected result is Negative.  Fact Sheet for Patients: SugarRoll.be  Fact Sheet for Healthcare Providers: https://www.woods-mathews.com/  This test is not yet approved or cleared by the [place]  States FDA and  has been authorized for detection and/or diagnosis of SARS-CoV-2 by FDA under an Emergency Use Authorization (EUA). This EUA will remain  in effect (meaning this test can be used) for the duration of the COVID-19 declaration under Se ction 564(b)(1) of the Act, 21 U.S.C. section 360bbb-3(b)(1), unless the authorization is terminated or revoked sooner.  Performed at [facility], [place] [address], [place],  [address]     Signed:  Max Sane MD.  [facility] 08/09/2021, 4:05 PM

## 2021-08-19 ENCOUNTER — Other Ambulatory Visit: Payer: Self-pay | Admitting: *Deleted

## 2021-08-19 DIAGNOSIS — D509 Iron deficiency anemia, unspecified: Secondary | ICD-10-CM

## 2021-08-24 ENCOUNTER — Telehealth: Payer: Self-pay

## 2021-08-24 NOTE — Telephone Encounter (Signed)
Transition Care Management Unsuccessful Follow-up Telephone Call  Date of discharge and from where:     08/08/2021  [facility]  Attempts:  1st Attempt  Reason for unsuccessful TCM follow-up call:  No answer/busy Tomasa Rand, RN, BSN, CEN [place] Coordinator [phone]

## 2021-08-25 ENCOUNTER — Inpatient Hospital Stay: Payer: BC Managed Care – PPO | Attending: Oncology

## 2021-09-01 ENCOUNTER — Telehealth: Payer: Self-pay | Admitting: *Deleted

## 2021-09-01 NOTE — Telephone Encounter (Signed)
Transition Care Management Follow-up Telephone Call Date of discharge and from where: 08/08/21 [facility] How have you been since you were released from the hospital? "Doing OK" Any questions or concerns? No  Items Reviewed: Did the pt receive and understand the discharge instructions provided? Yes  Medications obtained and verified? Yes  Other? No  Any new allergies since your discharge? No  Dietary orders reviewed? Yes Do you have support at home? Yes- daughter helped initially   Home Care and Equipment/Supplies: Were home health services ordered? no If so, what is the name of the agency? Not applicable  Has the agency set up a time to come to the patient's home? not applicable Were any new equipment or medical supplies ordered?  Yes:   What is the name of the medical supply agency? [employer] Were you able to get the supplies/equipment? yes Do you have any questions related to the use of the equipment or supplies? No- say she is no longer using the oxygen so she will call [employer] to have it picked up  Functional Questionnaire: (I = Independent and D = Dependent) ADLs: I  Bathing/Dressing- I  Meal Prep- I  Eating- I  Maintaining continence- I  Transferring/Ambulation- I  Managing Meds- I  Follow up appointments reviewed:  PCP Hospital f/u appt confirmed? No  patient messaged her primary care provider office and told them she needed a f/u in 3 days post discharge per her discharge instructions and she was told the office has no openings.  [facility] f/u appt confirmed? Yes  Scheduled to see surgical PA on 08/17/21 @ 2:00 pm.- completed ; states she will see surgeon on 09/18/21 Are transportation arrangements needed? No  If their condition worsens, is the pt aware to call PCP or go to the Emergency Dept.? Yes Was the patient provided with contact information for the PCP's office or ED? Yes Was to pt encouraged to call back with questions or concerns?  Yes Kelli Churn RN, CCM, [facility] Care Management Coordinator - Managed [place] [facility] [phone]

## 2021-09-07 DIAGNOSIS — R0902 Hypoxemia: Secondary | ICD-10-CM | POA: Diagnosis not present

## 2021-09-07 DIAGNOSIS — R059 Cough, unspecified: Secondary | ICD-10-CM | POA: Diagnosis not present

## 2021-09-07 DIAGNOSIS — J9601 Acute respiratory failure with hypoxia: Secondary | ICD-10-CM | POA: Diagnosis not present

## 2021-09-09 DIAGNOSIS — M25511 Pain in right shoulder: Secondary | ICD-10-CM | POA: Diagnosis not present

## 2021-09-09 DIAGNOSIS — G8929 Other chronic pain: Secondary | ICD-10-CM | POA: Diagnosis not present

## 2021-09-16 DIAGNOSIS — M25511 Pain in right shoulder: Secondary | ICD-10-CM | POA: Diagnosis not present

## 2021-09-16 DIAGNOSIS — G8929 Other chronic pain: Secondary | ICD-10-CM | POA: Diagnosis not present

## 2021-09-17 ENCOUNTER — Other Ambulatory Visit: Payer: Self-pay | Admitting: Internal Medicine

## 2021-09-21 DIAGNOSIS — M25511 Pain in right shoulder: Secondary | ICD-10-CM | POA: Diagnosis not present

## 2021-09-21 DIAGNOSIS — G8929 Other chronic pain: Secondary | ICD-10-CM | POA: Diagnosis not present

## 2021-09-24 DIAGNOSIS — G8929 Other chronic pain: Secondary | ICD-10-CM | POA: Diagnosis not present

## 2021-09-24 DIAGNOSIS — M25511 Pain in right shoulder: Secondary | ICD-10-CM | POA: Diagnosis not present

## 2021-09-29 ENCOUNTER — Other Ambulatory Visit: Payer: Self-pay | Admitting: *Deleted

## 2021-09-29 ENCOUNTER — Encounter: Payer: Self-pay | Admitting: Oncology

## 2021-09-29 DIAGNOSIS — M25511 Pain in right shoulder: Secondary | ICD-10-CM | POA: Diagnosis not present

## 2021-09-29 DIAGNOSIS — G8929 Other chronic pain: Secondary | ICD-10-CM | POA: Diagnosis not present

## 2021-09-29 DIAGNOSIS — D509 Iron deficiency anemia, unspecified: Secondary | ICD-10-CM

## 2021-10-01 DIAGNOSIS — G8929 Other chronic pain: Secondary | ICD-10-CM | POA: Diagnosis not present

## 2021-10-01 DIAGNOSIS — M25511 Pain in right shoulder: Secondary | ICD-10-CM | POA: Diagnosis not present

## 2021-10-05 DIAGNOSIS — M25511 Pain in right shoulder: Secondary | ICD-10-CM | POA: Diagnosis not present

## 2021-10-05 DIAGNOSIS — G8929 Other chronic pain: Secondary | ICD-10-CM | POA: Diagnosis not present

## 2021-10-06 ENCOUNTER — Other Ambulatory Visit: Payer: Self-pay

## 2021-10-06 ENCOUNTER — Inpatient Hospital Stay: Payer: Medicare HMO | Attending: Oncology

## 2021-10-06 ENCOUNTER — Encounter: Payer: Self-pay | Admitting: Oncology

## 2021-10-06 ENCOUNTER — Inpatient Hospital Stay (HOSPITAL_BASED_OUTPATIENT_CLINIC_OR_DEPARTMENT_OTHER): Payer: Medicare HMO | Admitting: Oncology

## 2021-10-06 VITALS — BP 157/81 | HR 88 | Temp 99.7°F | Resp 16 | Ht 65.0 in | Wt 218.8 lb

## 2021-10-06 DIAGNOSIS — D508 Other iron deficiency anemias: Secondary | ICD-10-CM

## 2021-10-06 DIAGNOSIS — D509 Iron deficiency anemia, unspecified: Secondary | ICD-10-CM | POA: Insufficient documentation

## 2021-10-06 DIAGNOSIS — Z780 Asymptomatic menopausal state: Secondary | ICD-10-CM | POA: Insufficient documentation

## 2021-10-06 DIAGNOSIS — Z800 Family history of malignant neoplasm of digestive organs: Secondary | ICD-10-CM | POA: Diagnosis not present

## 2021-10-06 DIAGNOSIS — E1122 Type 2 diabetes mellitus with diabetic chronic kidney disease: Secondary | ICD-10-CM | POA: Insufficient documentation

## 2021-10-06 DIAGNOSIS — N189 Chronic kidney disease, unspecified: Secondary | ICD-10-CM | POA: Insufficient documentation

## 2021-10-06 DIAGNOSIS — I129 Hypertensive chronic kidney disease with stage 1 through stage 4 chronic kidney disease, or unspecified chronic kidney disease: Secondary | ICD-10-CM | POA: Insufficient documentation

## 2021-10-06 DIAGNOSIS — Z87891 Personal history of nicotine dependence: Secondary | ICD-10-CM | POA: Insufficient documentation

## 2021-10-06 LAB — IRON AND TIBC
Iron: 69 ug/dL (ref 28–170)
Saturation Ratios: 16 % (ref 10.4–31.8)
TIBC: 434 ug/dL (ref 250–450)
UIBC: 365 ug/dL

## 2021-10-06 LAB — CBC WITH DIFFERENTIAL/PLATELET
Abs Immature Granulocytes: 0.02 10*3/uL (ref 0.00–0.07)
Basophils Absolute: 0.1 10*3/uL (ref 0.0–0.1)
Basophils Relative: 1 %
Eosinophils Absolute: 0.3 10*3/uL (ref 0.0–0.5)
Eosinophils Relative: 6 %
HCT: 43.6 % (ref 36.0–46.0)
Hemoglobin: 13.8 g/dL (ref 12.0–15.0)
Immature Granulocytes: 0 %
Lymphocytes Relative: 33 %
Lymphs Abs: 1.9 10*3/uL (ref 0.7–4.0)
MCH: 27.3 pg (ref 26.0–34.0)
MCHC: 31.7 g/dL (ref 30.0–36.0)
MCV: 86.3 fL (ref 80.0–100.0)
Monocytes Absolute: 0.4 10*3/uL (ref 0.1–1.0)
Monocytes Relative: 7 %
Neutro Abs: 3.2 10*3/uL (ref 1.7–7.7)
Neutrophils Relative %: 53 %
Platelets: 271 10*3/uL (ref 150–400)
RBC: 5.05 MIL/uL (ref 3.87–5.11)
RDW: 17.6 % — ABNORMAL HIGH (ref 11.5–15.5)
WBC: 5.9 10*3/uL (ref 4.0–10.5)
nRBC: 0 % (ref 0.0–0.2)

## 2021-10-06 LAB — FERRITIN: Ferritin: 46 ng/mL (ref 11–307)

## 2021-10-06 NOTE — Progress Notes (Signed)
Hematology/Oncology Consult note Cheryl Wallace  Telephone:([phone] [phone]  Patient Care Team: Crecencio Mc, MD as PCP - General (Internal Medicine) [name], MD (General Surgery)   Name of the patient: Cheryl Wallace  [id number]  1950/12/07   Date of visit: 10/06/21  Diagnosis-iron deficiency anemia  Chief complaint/ Reason for visit-routine follow-up of iron deficiency anemia  Heme/Onc history: patient is a 71 year old female with a past medical history significant for hypertension type 2 diabetes CKD and she has been referred to [place] for anemia.  Her most recent CBC from 10/3/2022Showed white count of 10, H&H of 10.3/34.1 with an MCV of 73.7 and a platelet count of 331.  Her prior CBC from September 2022 showed an H&H of 9.8/23.9.  Patient has had chronic microcytosis with evidence of iron deficiency in the past.  No recent iron studies checked.  Patient denies any blood loss in her stool or urine.  Denies any dark melanotic stools.  Family history positive for colon cancer in her mother in her 92s.  She has had a colonoscopy in June 2021 by Dr. Alice Reichert which showed nonbleeding internal hemorrhoids but otherwise normal.  She has a history of GERD and has had an EGD many years ago.  Denies any changes in her appetite or weight.  Denies any difficulty swallowing.  Denies any consistent use of NSAIDs.  She is postmenopausal   Patient is currently receiving Venofer.  Results of blood work from 06/24/2021 were as follows: CBC showed an H&H of 10.4/35.6 with an MCV of 76 and a platelet count of 306.  Ferritin levels were low at 7.  Iron saturation low at 7% with an elevated TIBC of 568.  B12 folate TSH normal.  Haptoglobin normal.  Myeloma panel showed no M protein.    Interval history-patient was recently hospitalized after her rotator cuff surgery which was complicated by hypoxic respiratory failure.  She is presently off oxygen.  Denies any  blood loss in her stool or urine.  Energy levels are slowly improving.  ECOG PS- 1 Pain scale- 0   Review of systems- Review of Systems  Constitutional:  Negative for chills, fever, malaise/fatigue and weight loss.  HENT:  Negative for congestion, ear discharge and nosebleeds.   Eyes:  Negative for blurred vision.  Respiratory:  Negative for cough, hemoptysis, sputum production, shortness of breath and wheezing.   Cardiovascular:  Negative for chest pain, palpitations, orthopnea and claudication.  Gastrointestinal:  Negative for abdominal pain, blood in stool, constipation, diarrhea, heartburn, melena, nausea and vomiting.  Genitourinary:  Negative for dysuria, flank pain, frequency, hematuria and urgency.  Musculoskeletal:  Negative for back pain, joint pain and myalgias.  Skin:  Negative for rash.  Neurological:  Negative for dizziness, tingling, focal weakness, seizures, weakness and headaches.  Endo/Heme/Allergies:  Does not bruise/bleed easily.  Psychiatric/Behavioral:  Negative for depression and suicidal ideas. The patient does not have insomnia.      Allergies  Allergen Reactions   Shellfish Allergy Swelling    Swelling of face, lips and hands. No difficulty breathing. No problems with topical betadine   Lisinopril Cough    cough   Ozempic (0.25 Or 0.5 Mg-Dose) [Semaglutide(0.25 Or 0.5mg -Dos)] Nausea And Vomiting   Hctz [Hydrochlorothiazide]     Muscle cramps   Penicillins Rash     Past Medical History:  Diagnosis Date   Acute meniscal tear, medial, right, sequela 09/22/2020   Allergic rhinitis    Anemia  iron deficiency   Arthritis    osteoarthritis right knee   Chronic kidney disease    nephropathy d/t diabetes   Complication of anesthesia 1981   While pt was under anesthesia, pt had an asthema attack during BTL due to having the flu   Depression, major, single episode, mild ([place]) 09/04/2016   Elbow tendonitis    a. bilat, s/p surgery.   Essential  hypertension    Functional ovarian cysts 2011   a. s/p TAH (1994);  b. s/p oopherectomy (~2011)   Gallstones    GERD (gastroesophageal reflux disease)    History of kidney stones    Hyperlipidemia    Insomnia    Low grade fever    Obesity    Polyarthralgia    Seasonal allergies    Seizure ([place])    last episode at 14 years. nothing since. unknown etiology   Tinnitus    Type 2 diabetes mellitus ([place])    a.  Hemoglobin A1c in July 2016, 6.7.     Past Surgical History:  Procedure Laterality Date   ABDOMINAL HYSTERECTOMY  1994   COLONOSCOPY  05/2007   Non-bleeding internal hemorhoids identified.   COLONOSCOPY WITH PROPOFOL N/A 02/25/2020   Procedure: COLONOSCOPY WITH PROPOFOL;  Surgeon: Toledo, Benay Pike, MD;  Location: [facility] ENDOSCOPY;  Service: Gastroenterology;  Laterality: N/A;   ELBOW SURGERY Bilateral    d/t tendonitis   FRACTURE SURGERY Right    wrist/hand with plate   KNEE ARTHROSCOPY Right 11/18/2020   Procedure: Right knee arthroscopy, partial medial meniscectomy;  Surgeon: Hessie Knows, MD;  Location: [facility] ORS;  Service: Orthopedics;  Laterality: Right;   NASAL SINUS SURGERY  09/18/2015   Procedure: IMAGE GUIDED SINUS SURGERY, BILATERAL MAXILLARY BALLOON SINUPLASTY, BILATERAL FRONTAL BALLOON SINUPLASTY, RIGHT SPHENOID  SINUPLASTY, RIGHT CONCHABULLOSA RESECTION;  Surgeon: Carloyn Manner, MD;  Location: [facility] ORS;  Service: ENT;;   OOPHORECTOMY  04/2010   bilateral, 7 cm benign tumor left ovary   OPEN REDUCTION INTERNAL FIXATION (ORIF) DISTAL RADIAL FRACTURE Right 02/07/2017   Procedure: OPEN REDUCTION INTERNAL FIXATION (ORIF) DISTAL RADIAL FRACTURE;  Surgeon: Hessie Knows, MD;  Location: [facility] ORS;  Service: Orthopedics;  Laterality: Right;   PARTIAL HYSTERECTOMY  1994   still has ovaries, removed for fibroids   SHOULDER ARTHROSCOPY WITH SUBACROMIAL DECOMPRESSION, ROTATOR CUFF REPAIR AND BICEP TENDON REPAIR Right 08/06/2021   Procedure: RIGHT SHOULDER ARTHROSCOPY WITH  DEBRIDEMENT, DECOMPRESSION, AND POSSIBLE ROTATOR CUFF REPAIR;  Surgeon: Corky Mull, MD;  Location: [facility] ORS;  Service: Orthopedics;  Laterality: Right;   TONSILLECTOMY N/A 09/18/2015   Procedure: TONSILLECTOMY;  Surgeon: Carloyn Manner, MD;  Location: [facility] ORS;  Service: ENT;  Laterality: N/A;   TUBAL LIGATION     UPPER GI ENDOSCOPY  2011    Social History   Socioeconomic History   Marital status: Divorced    Spouse name: Not on file   Number of children: Not on file   Years of education: Not on file   Highest education level: Not on file  Occupational History   Occupation: school and bus driver    Comment: still working  Tobacco Use   Smoking status: Former    Packs/day: 1.50    Years: 10.00    Pack years: 15.00    Types: Cigarettes    Quit date: 09/05/1984    Years since quitting: 37.1   Smokeless tobacco: Never   Tobacco comments:    remote, quit 30 years ago  Vaping Use   Vaping Use:  Never used  Substance and Sexual Activity   Alcohol use: Not Currently    Alcohol/week: 0.0 standard drinks    Comment: on occasion, martinis maybe twice a year (Christmas & New Years).   Drug use: No   Sexual activity: Not Currently  Other Topics Concern   Not on file  Social History Narrative   Patient lives alone. Currently working. Daughter lives somewhat nearby.    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     Does not routinely exercise.  Always uses seat belts.  Has well water.   Social Determinants of Health   Financial Resource Strain: Not on file  Food Insecurity: Not on file  Transportation Needs: Not on file  Physical Activity: Not on file  Stress: Not on file  Social Connections: Not on file  Intimate Partner Violence: Not on file    Family History  Problem Relation Age of Onset   Cancer Mother        colon   Tuberculosis Father    Diabetes Father    Heart disease Father        MI x 2   Heart attack Father    Diabetes Sister        half sister      Current Outpatient Medications:    acetaminophen (TYLENOL) 500 MG tablet, Take 1,000 mg by mouth every 6 (six) hours as needed for mild pain or moderate pain., Disp: , Rfl:    amLODipine (NORVASC) 10 MG tablet, Take 1 tablet (10 mg total) by mouth every morning. TAKE 1 TABLET BY MOUTH EVERYDAY AT BEDTIME, Disp: , Rfl:    aspirin 81 MG chewable tablet, Chew 81 mg by mouth daily., Disp: , Rfl:    atorvastatin (LIPITOR) 20 MG tablet, Take 1 tablet (20 mg total) by mouth at bedtime., Disp: , Rfl:    carvedilol (COREG) 3.125 MG tablet, TAKE 1 TABLET (3.125 MG TOTAL) BY MOUTH 2 (TWO) TIMES DAILY WITH A MEAL., Disp: 180 tablet, Rfl: 1   cholecalciferol (VITAMIN D) 1000 UNITS tablet, Take 1,000 Units by mouth daily. , Disp: , Rfl:    diphenhydrAMINE (BENADRYL) 50 MG tablet, Take 50 mg by mouth at bedtime., Disp: , Rfl:    DULoxetine HCl 40 MG CPEP, Take 40 mg by mouth at bedtime., Disp: , Rfl:    fexofenadine (ALLEGRA) 180 MG tablet, Take 180 mg by mouth every morning., Disp: , Rfl:    fluticasone (FLONASE) 50 MCG/ACT nasal spray, Place 2 sprays into both nostrils daily as needed for allergies., Disp: , Rfl:    glipiZIDE (GLUCOTROL) 10 MG tablet, TAKE 1 TABLET (10 MG TOTAL) BY MOUTH 2 (TWO) TIMES DAILY BEFORE A MEAL., Disp: 180 tablet, Rfl: 1   JARDIANCE 10 MG TABS tablet, TAKE 1 TABLET BY MOUTH EVERY DAY, Disp: 90 tablet, Rfl: 1   metFORMIN (GLUCOPHAGE) 1000 MG tablet, TAKE 1 TABLET BY MOUTH 2 TIMES DAILY WITH A MEAL., Disp: 180 tablet, Rfl: 1   omeprazole (PRILOSEC) 40 MG capsule, Take 40 mg by mouth 2 (two) times daily., Disp: , Rfl:    tiZANidine (ZANAFLEX) 2 MG tablet, Take 2 mg by mouth at bedtime., Disp: , Rfl:    valsartan (DIOVAN) 40 MG tablet, TAKE 1 TABLET BY MOUTH EVERYDAY AT BEDTIME, Disp: 90 tablet, Rfl: 1   zolpidem (AMBIEN) 5 MG tablet, TAKE 1 TABLET BY MOUTH AT BEDTIME AS NEEDED FOR SLEEP., Disp: 30 tablet, Rfl: 5  Physical exam:  Vitals:   10/06/21  1102  BP: (!) 157/81   Pulse: 88  Resp: 16  Temp: 99.7 F (37.6 C)  TempSrc: Tympanic  SpO2: 95%  Weight: 218 lb 12.8 oz (99.2 kg)  Height: 5\' 5"  (1.651 m)   Physical Exam Constitutional:      General: She is not in acute distress. Cardiovascular:     Rate and Rhythm: Normal rate and regular rhythm.     Heart sounds: Normal heart sounds.  Pulmonary:     Effort: Pulmonary effort is normal.     Breath sounds: Normal breath sounds.  Skin:    General: Skin is warm and dry.  Neurological:     Mental Status: She is alert and oriented to person, place, and time.     CMP Latest Ref Rng & Units 08/07/2021  Glucose 70 - 99 mg/dL 203(H)  BUN 8 - 23 mg/dL 13  Creatinine 0.44 - 1.00 mg/dL 0.83  Sodium 135 - 145 mmol/L 139  Potassium 3.5 - 5.1 mmol/L 3.6  Chloride 98 - 111 mmol/L 109  CO2 22 - 32 mmol/L 25  Calcium 8.9 - 10.3 mg/dL 8.2(L)  Total Protein 6.0 - 8.3 g/dL -  Total Bilirubin 0.2 - 1.2 mg/dL -  Alkaline Phos 39 - 117 U/L -  AST 0 - 37 U/L -  ALT 0 - 35 U/L -   CBC Latest Ref Rng & Units 10/06/2021  WBC 4.0 - 10.5 K/uL 5.9  Hemoglobin 12.0 - 15.0 g/dL 13.8  Hematocrit 36.0 - 46.0 % 43.6  Platelets 150 - 400 K/uL 271     Assessment and plan- Patient is a 71 y.o. female with history of iron deficiency anemia here for routine follow-up  Patient last received IV iron in February 2022.  Following that her hemoglobin is improved from 11.1-13.8.  Ferritin levels are presently normal at 46With an iron saturation of 16%.  We will hold off on giving any IV iron at this time.  Repeat CBC ferritin and iron studies in 3 in 6 months and I will see her back in 6 months   Visit Diagnosis 1. Iron deficiency anemia, unspecified iron deficiency anemia type   2. Other iron deficiency anemia   3. Microcytic anemia      Dr. Randa Evens, MD, MPH [facility] [phone] 10/06/2021 1:03 PM

## 2021-10-08 DIAGNOSIS — R0902 Hypoxemia: Secondary | ICD-10-CM | POA: Diagnosis not present

## 2021-10-08 DIAGNOSIS — J9601 Acute respiratory failure with hypoxia: Secondary | ICD-10-CM | POA: Diagnosis not present

## 2021-10-08 DIAGNOSIS — R059 Cough, unspecified: Secondary | ICD-10-CM | POA: Diagnosis not present

## 2021-10-08 DIAGNOSIS — M25511 Pain in right shoulder: Secondary | ICD-10-CM | POA: Diagnosis not present

## 2021-10-08 DIAGNOSIS — G8929 Other chronic pain: Secondary | ICD-10-CM | POA: Diagnosis not present

## 2021-10-12 DIAGNOSIS — G8929 Other chronic pain: Secondary | ICD-10-CM | POA: Diagnosis not present

## 2021-10-12 DIAGNOSIS — M25511 Pain in right shoulder: Secondary | ICD-10-CM | POA: Diagnosis not present

## 2021-10-15 DIAGNOSIS — M25511 Pain in right shoulder: Secondary | ICD-10-CM | POA: Diagnosis not present

## 2021-10-15 DIAGNOSIS — G8929 Other chronic pain: Secondary | ICD-10-CM | POA: Diagnosis not present

## 2021-10-19 DIAGNOSIS — G8929 Other chronic pain: Secondary | ICD-10-CM | POA: Diagnosis not present

## 2021-10-19 DIAGNOSIS — M25511 Pain in right shoulder: Secondary | ICD-10-CM | POA: Diagnosis not present

## 2021-10-21 ENCOUNTER — Other Ambulatory Visit: Payer: Self-pay | Admitting: Internal Medicine

## 2021-10-21 DIAGNOSIS — E785 Hyperlipidemia, unspecified: Secondary | ICD-10-CM

## 2021-10-21 DIAGNOSIS — E1169 Type 2 diabetes mellitus with other specified complication: Secondary | ICD-10-CM

## 2021-10-23 ENCOUNTER — Ambulatory Visit: Payer: BC Managed Care – PPO | Admitting: Internal Medicine

## 2021-10-26 DIAGNOSIS — G8929 Other chronic pain: Secondary | ICD-10-CM | POA: Diagnosis not present

## 2021-10-26 DIAGNOSIS — M25511 Pain in right shoulder: Secondary | ICD-10-CM | POA: Diagnosis not present

## 2021-10-27 ENCOUNTER — Encounter: Payer: Self-pay | Admitting: Internal Medicine

## 2021-10-28 ENCOUNTER — Encounter
Admission: RE | Disposition: A | Discharge status: Home / Self Care | Payer: Self-pay | Source: Home / Self Care | Attending: Internal Medicine

## 2021-10-28 ENCOUNTER — Ambulatory Visit: Payer: Medicare HMO | Admitting: Anesthesiology

## 2021-10-28 ENCOUNTER — Ambulatory Visit
Admission: RE | Admit: 2021-10-28 | Discharge: 2021-10-28 | Disposition: A | Discharge status: Home / Self Care | Payer: Medicare HMO | Attending: Internal Medicine | Admitting: Internal Medicine

## 2021-10-28 ENCOUNTER — Encounter: Payer: Self-pay | Admitting: Internal Medicine

## 2021-10-28 DIAGNOSIS — F32A Depression, unspecified: Secondary | ICD-10-CM | POA: Diagnosis not present

## 2021-10-28 DIAGNOSIS — K2970 Gastritis, unspecified, without bleeding: Secondary | ICD-10-CM | POA: Diagnosis not present

## 2021-10-28 DIAGNOSIS — E785 Hyperlipidemia, unspecified: Secondary | ICD-10-CM | POA: Insufficient documentation

## 2021-10-28 DIAGNOSIS — Z87891 Personal history of nicotine dependence: Secondary | ICD-10-CM | POA: Insufficient documentation

## 2021-10-28 DIAGNOSIS — Z800 Family history of malignant neoplasm of digestive organs: Secondary | ICD-10-CM | POA: Insufficient documentation

## 2021-10-28 DIAGNOSIS — R569 Unspecified convulsions: Secondary | ICD-10-CM | POA: Diagnosis not present

## 2021-10-28 DIAGNOSIS — E1122 Type 2 diabetes mellitus with diabetic chronic kidney disease: Secondary | ICD-10-CM | POA: Diagnosis not present

## 2021-10-28 DIAGNOSIS — M1711 Unilateral primary osteoarthritis, right knee: Secondary | ICD-10-CM | POA: Diagnosis not present

## 2021-10-28 DIAGNOSIS — E669 Obesity, unspecified: Secondary | ICD-10-CM | POA: Insufficient documentation

## 2021-10-28 DIAGNOSIS — N1831 Chronic kidney disease, stage 3a: Secondary | ICD-10-CM | POA: Insufficient documentation

## 2021-10-28 DIAGNOSIS — Z7984 Long term (current) use of oral hypoglycemic drugs: Secondary | ICD-10-CM | POA: Insufficient documentation

## 2021-10-28 DIAGNOSIS — K219 Gastro-esophageal reflux disease without esophagitis: Secondary | ICD-10-CM | POA: Insufficient documentation

## 2021-10-28 DIAGNOSIS — I129 Hypertensive chronic kidney disease with stage 1 through stage 4 chronic kidney disease, or unspecified chronic kidney disease: Secondary | ICD-10-CM | POA: Insufficient documentation

## 2021-10-28 DIAGNOSIS — D509 Iron deficiency anemia, unspecified: Secondary | ICD-10-CM | POA: Insufficient documentation

## 2021-10-28 DIAGNOSIS — Z6836 Body mass index (BMI) 36.0-36.9, adult: Secondary | ICD-10-CM | POA: Insufficient documentation

## 2021-10-28 DIAGNOSIS — Z79899 Other long term (current) drug therapy: Secondary | ICD-10-CM | POA: Diagnosis not present

## 2021-10-28 DIAGNOSIS — G709 Myoneural disorder, unspecified: Secondary | ICD-10-CM | POA: Insufficient documentation

## 2021-10-28 DIAGNOSIS — G4700 Insomnia, unspecified: Secondary | ICD-10-CM | POA: Insufficient documentation

## 2021-10-28 HISTORY — PX: ESOPHAGOGASTRODUODENOSCOPY (EGD) WITH PROPOFOL: SHX5813

## 2021-10-28 LAB — GLUCOSE, CAPILLARY: Glucose-Capillary: 132 mg/dL — ABNORMAL HIGH (ref 70–99)

## 2021-10-28 SURGERY — ESOPHAGOGASTRODUODENOSCOPY (EGD) WITH PROPOFOL
Anesthesia: General

## 2021-10-28 MED ORDER — PROPOFOL 500 MG/50ML IV EMUL
INTRAVENOUS | Status: DC | PRN
  Administered 2021-10-28: 145 ug/kg/min via INTRAVENOUS

## 2021-10-28 MED ORDER — GLYCOPYRROLATE 0.2 MG/ML IJ SOLN
INTRAMUSCULAR | Status: DC | PRN
  Administered 2021-10-28: .2 mg via INTRAVENOUS

## 2021-10-28 MED ORDER — SODIUM CHLORIDE 0.9 % IV SOLN
INTRAVENOUS | Status: DC
  Administered 2021-10-28: 1000 mL via INTRAVENOUS

## 2021-10-28 MED ORDER — PROPOFOL 10 MG/ML IV BOLUS
INTRAVENOUS | Status: DC | PRN
Start: 2021-10-28 — End: 2021-10-28
  Administered 2021-10-28: 60 mg via INTRAVENOUS

## 2021-10-28 MED ORDER — LIDOCAINE HCL (CARDIAC) PF 100 MG/5ML IV SOSY
PREFILLED_SYRINGE | INTRAVENOUS | Status: DC | PRN
  Administered 2021-10-28: 100 mg via INTRAVENOUS

## 2021-10-28 NOTE — H&P (Signed)
Outpatient short stay form Pre-procedure 10/28/2021 10:23 AM Cheryl Wallace K. Cheryl Wallace, M.D.  Primary Physician: Deborra Medina, M.D.  Reason for visit:    History of present illness:  Cheryl Wallace is a 71 y.o. female with T2DM, CKD stage 3a, HTN, HLD, depression, seizure, kidney stones, GERD, who was last seen in the office 02/25/2020 for FH colon cancer. She was set up for her colonoscopy. She reported no LGI complaints and her GERD sx of cough was well managed on omeprazole.   Her colonoscopy was normal and no biopsies taken. She has remote EGD in 2008. She has been followed by Nephrology for declining renal function., HTN-lower extremity edema. She was referred to Hematology in 06/24/2021 for anemia. Hx of chronic anemia since 2019. Denies blood loss in stool or urine. Etiology of anemia thought to be anemia from long standing iron deficiency. She has received IV iron and was sent for further endoscopic GI evaluation.   She denies any change in bowel habits, abd pain, blood or melena, heartburn, dysphagia, n/v or unintended wt loss.  Wt Readings from Last 3 Encounters:     Current Facility-Administered Medications:    0.9 %  sodium chloride infusion, , Intravenous, Continuous, Jeny Nield, Benay Pike, MD, Last Rate: 20 mL/hr at 10/28/21 1002, 1,000 mL at 10/28/21 1002  Medications Prior to Admission  Medication Sig Dispense Refill Last Dose   amLODipine (NORVASC) 10 MG tablet Take 1 tablet (10 mg total) by mouth every morning. TAKE 1 TABLET BY MOUTH EVERYDAY AT BEDTIME   10/28/2021 at 0700   aspirin 81 MG chewable tablet Chew 81 mg by mouth daily.   Past Week   atorvastatin (LIPITOR) 20 MG tablet TAKE 1 TABLET BY MOUTH EVERY DAY 90 tablet 1 10/27/2021   carvedilol (COREG) 3.125 MG tablet TAKE 1 TABLET (3.125 MG TOTAL) BY MOUTH 2 (TWO) TIMES DAILY WITH A MEAL. 180 tablet 1 10/27/2021   cholecalciferol (VITAMIN D) 1000 UNITS tablet Take 1,000 Units by mouth daily.    10/27/2021   DULoxetine HCl 40 MG CPEP Take  40 mg by mouth at bedtime.   10/27/2021   glipiZIDE (GLUCOTROL) 10 MG tablet TAKE 1 TABLET (10 MG TOTAL) BY MOUTH 2 (TWO) TIMES DAILY BEFORE A MEAL. 180 tablet 1 10/27/2021   JARDIANCE 10 MG TABS tablet TAKE 1 TABLET BY MOUTH EVERY DAY 90 tablet 1 10/27/2021   metFORMIN (GLUCOPHAGE) 1000 MG tablet TAKE 1 TABLET BY MOUTH 2 TIMES DAILY WITH A MEAL. 180 tablet 1 10/27/2021   omeprazole (PRILOSEC) 40 MG capsule Take 40 mg by mouth 2 (two) times daily.   10/27/2021   valsartan (DIOVAN) 40 MG tablet TAKE 1 TABLET BY MOUTH EVERYDAY AT BEDTIME 90 tablet 1 10/27/2021   acetaminophen (TYLENOL) 500 MG tablet Take 1,000 mg by mouth every 6 (six) hours as needed for mild pain or moderate pain.    at prn   diphenhydrAMINE (BENADRYL) 50 MG tablet Take 50 mg by mouth at bedtime.    at prn   fexofenadine (ALLEGRA) 180 MG tablet Take 180 mg by mouth every morning.    at prn   fluticasone (FLONASE) 50 MCG/ACT nasal spray Place 2 sprays into both nostrils daily as needed for allergies.    at prn   tiZANidine (ZANAFLEX) 2 MG tablet Take 2 mg by mouth at bedtime.    at prn   zolpidem (AMBIEN) 5 MG tablet TAKE 1 TABLET BY MOUTH AT BEDTIME AS NEEDED FOR SLEEP. 30 tablet 5  at prn  Allergies  Allergen Reactions   Shellfish Allergy Swelling    Swelling of face, lips and hands. No difficulty breathing. No problems with topical betadine   Lisinopril Cough    cough   Ozempic (0.25 Or 0.5 Mg-Dose) [Semaglutide(0.25 Or 0.5mg -Dos)] Nausea And Vomiting   Hctz [Hydrochlorothiazide]     Muscle cramps   Penicillins Rash     Past Medical History:  Diagnosis Date   Acute meniscal tear, medial, right, sequela 09/22/2020   Allergic rhinitis    Anemia    iron deficiency   Arthritis    osteoarthritis right knee   Chronic kidney disease    nephropathy d/t diabetes   Complication of anesthesia 1981   While pt was under anesthesia, pt had an asthema attack during BTL due to having the flu   Depression, major, single  episode, mild ([place]) 09/04/2016   Elbow tendonitis    a. bilat, s/p surgery.   Essential hypertension    Functional ovarian cysts 2011   a. s/p TAH (1994);  b. s/p oopherectomy (~2011)   Gallstones    GERD (gastroesophageal reflux disease)    History of kidney stones    Hyperlipidemia    Insomnia    Low grade fever    Obesity    Polyarthralgia    Seasonal allergies    Seizure ([place])    last episode at 14 years. nothing since. unknown etiology   Tinnitus    Type 2 diabetes mellitus ([place])    a.  Hemoglobin A1c in July 2016, 6.7.    Review of systems:  Otherwise negative.    Physical Exam  Gen: Alert, oriented. Appears stated age.  HEENT: /AT. PERRLA. Lungs: CTA, no wheezes. CV: RR nl S1, S2. Abd: soft, benign, no masses. BS+ Ext: No edema. Pulses 2+    Planned procedures: Proceed with EGD The patient understands the nature of the planned procedure, indications, risks, alternatives and potential complications including but not limited to bleeding, infection, perforation, damage to internal organs and possible oversedation/side effects from anesthesia. The patient agrees and gives consent to proceed.  Please refer to procedure notes for findings, recommendations and patient disposition/instructions.     Cheryl Wallace K. Cheryl Wallace, M.D. Gastroenterology 10/28/2021  10:23 AM

## 2021-10-28 NOTE — Anesthesia Preprocedure Evaluation (Signed)
Anesthesia Evaluation  Patient identified by MRN, date of birth, ID band Patient awake    Reviewed: Allergy & Precautions, NPO status , Patient's Chart, lab work & pertinent test results  History of Anesthesia Complications (+) history of anesthetic complications  Airway Mallampati: III  TM Distance: <3 FB Neck ROM: full    Dental  (+) Missing, Implants, Edentulous Upper, Edentulous Lower, Poor Dentition, Chipped   Pulmonary neg shortness of breath, former smoker,    Pulmonary exam normal        Cardiovascular hypertension, (-) angina(-) Past MI and (-) DOE Normal cardiovascular exam     Neuro/Psych Seizures -, Well Controlled,  PSYCHIATRIC DISORDERS  Neuromuscular disease    GI/Hepatic Neg liver ROS, GERD  Medicated and Controlled,  Endo/Other  diabetes, Type 2  Renal/GU CRFRenal disease     Musculoskeletal  (+) Arthritis ,   Abdominal (+) + obese,   Peds  Hematology negative hematology ROS (+)   Anesthesia Other Findings Past Medical History: 09/22/2020: Acute meniscal tear, medial, right, sequela No date: Allergic rhinitis No date: Anemia     Comment:  iron deficiency No date: Arthritis     Comment:  osteoarthritis right knee No date: Chronic kidney disease     Comment:  nephropathy d/t diabetes 1448: Complication of anesthesia     Comment:  While pt was under anesthesia, pt had an asthema attack               during BTL due to having the flu 09/04/2016: Depression, major, single episode, mild ([facility]) No date: Elbow tendonitis     Comment:  a. bilat, s/p surgery. No date: Essential hypertension 2011: Functional ovarian cysts     Comment:  a. s/p TAH (1994);  b. s/p oopherectomy (~2011) No date: Gallstones No date: GERD (gastroesophageal reflux disease) No date: History of kidney stones No date: Hyperlipidemia No date: Insomnia No date: Low grade fever No date: Obesity No date: Polyarthralgia No  date: Seasonal allergies No date: Seizure [facility])     Comment:  last episode at 14 years. nothing since. unknown               etiology No date: Tinnitus No date: Type 2 diabetes mellitus ([place])     Comment:  a.  Hemoglobin A1c in July 2016, 6.7.  Past Surgical History: 1994: ABDOMINAL HYSTERECTOMY 05/2007: COLONOSCOPY     Comment:  Non-bleeding internal hemorhoids identified. 02/25/2020: COLONOSCOPY WITH PROPOFOL; N/A     Comment:  Procedure: COLONOSCOPY WITH PROPOFOL;  Surgeon: [place],               Benay Pike, MD;  Location: [facility] ENDOSCOPY;  Service:               Gastroenterology;  Laterality: N/A; No date: ELBOW SURGERY; Bilateral     Comment:  d/t tendonitis No date: FRACTURE SURGERY; Right     Comment:  wrist/hand with plate 11/18/2020: KNEE ARTHROSCOPY; Right     Comment:  Procedure: Right knee arthroscopy, partial medial               meniscectomy;  Surgeon: Hessie Knows, MD;  Location:               [facility] ORS;  Service: Orthopedics;  Laterality: Right; 09/18/2015: NASAL SINUS SURGERY     Comment:  Procedure: IMAGE GUIDED SINUS SURGERY, BILATERAL               MAXILLARY BALLOON SINUPLASTY, BILATERAL FRONTAL BALLOON  SINUPLASTY, RIGHT SPHENOID  SINUPLASTY, RIGHT               CONCHABULLOSA RESECTION;  Surgeon: Carloyn Manner, MD;               Location: [facility] ORS;  Service: ENT;; 04/2010: OOPHORECTOMY     Comment:  bilateral, 7 cm benign tumor left ovary 02/07/2017: OPEN REDUCTION INTERNAL FIXATION (ORIF) DISTAL RADIAL  FRACTURE; Right     Comment:  Procedure: OPEN REDUCTION INTERNAL FIXATION (ORIF)               DISTAL RADIAL FRACTURE;  Surgeon: Hessie Knows, MD;                Location: [facility] ORS;  Service: Orthopedics;  Laterality:               Right; 1994: PARTIAL HYSTERECTOMY     Comment:  still has ovaries, removed for fibroids 09/18/2015: TONSILLECTOMY; N/A     Comment:  Procedure: TONSILLECTOMY;  Surgeon: Carloyn Manner,               MD;   Location: [facility] ORS;  Service: ENT;  Laterality: N/A; No date: TUBAL LIGATION 2011: UPPER GI ENDOSCOPY     Reproductive/Obstetrics negative OB ROS                             Anesthesia Physical  Anesthesia Plan  ASA: 3  Anesthesia Plan: General   Post-op Pain Management:    Induction: Intravenous  PONV Risk Score and Plan: Midazolam, Propofol infusion and TIVA  Airway Management Planned: Natural Airway and Simple Face Mask  Additional Equipment:   Intra-op Plan:   Post-operative Plan:   Informed Consent: I have reviewed the patients History and Physical, chart, labs and discussed the procedure including the risks, benefits and alternatives for the proposed anesthesia with the patient or authorized representative who has indicated his/her understanding and acceptance.     Dental Advisory Given  Plan Discussed with: Anesthesiologist, CRNA and Surgeon  Anesthesia Plan Comments: (Patient consented for risks of anesthesia including but not limited to:  - adverse reactions to medications - damage to eyes, teeth, lips or other oral mucosa - nerve damage due to positioning  - sore throat or hoarseness - Damage to heart, brain, nerves, lungs, other parts of body or loss of life  Patient voiced understanding.)        Anesthesia Quick Evaluation

## 2021-10-28 NOTE — Op Note (Signed)
[facility] Gastroenterology Patient Name: Cheryl Wallace Procedure Date: 10/28/2021 10:17 AM MRN: [id number] Account #: [id number] Date of Birth: [birth year] Admit Type: Outpatient Age: 71 Room: [facility] ENDO ROOM 2 Gender: Female Note Status: Finalized Instrument Name: Upper Endoscope [id number] Procedure:             Upper GI endoscopy Indications:           Unexplained iron deficiency anemia, Gastro-esophageal                         reflux disease Providers:             Benay Pike. Alice Reichert MD, MD Referring MD:          Deborra Medina, MD (Referring MD) Medicines:             Propofol per Anesthesia Complications:         No immediate complications. Procedure:             Pre-Anesthesia Assessment:                        - The risks and benefits of the procedure and the                         sedation options and risks were discussed with the                         patient. All questions were answered and informed                         consent was obtained.                        - Patient identification and proposed procedure were                         verified prior to the procedure by the nurse. The                         procedure was verified in the procedure room.                        - ASA Grade Assessment: III - A patient with severe                         systemic disease.                        - After reviewing the risks and benefits, the patient                         was deemed in satisfactory condition to undergo the                         procedure.                        After obtaining informed consent, the endoscope was                         passed under direct  vision. Throughout the procedure,                         the patient's blood pressure, pulse, and oxygen                         saturations were monitored continuously. The Endoscope                         was introduced through the mouth, and advanced to the                          third part of duodenum. The upper GI endoscopy was                         accomplished without difficulty. The patient tolerated                         the procedure well. Findings:      The esophagus was normal.      Scattered mild inflammation characterized by erythema was found in the       entire examined stomach.      No other significant abnormalities were identified in a careful       examination of the stomach.      The examined duodenum was normal. Biopsies for histology were taken with       a cold forceps for evaluation of celiac disease. Impression:            - Normal esophagus.                        - Gastritis.                        - Normal examined duodenum. Biopsied. Recommendation:        - Patient has a contact number available for                         emergencies. The signs and symptoms of potential                         delayed complications were discussed with the patient.                         Return to normal activities tomorrow. Written                         discharge instructions were provided to the patient.                        - Resume previous diet.                        - Continue present medications.                        - Await pathology results.                        - Await pathology results.                        -  If duodenal biopsies are negative, I will advocate                         for video capsule endoscopy of the small intestine +/-                         repeat colonoscopy.                        - Return to nurse practitioner in 2 months.                        - Follow up with Dawson Bills, NP at [facility]                         Gastroenterology.                        - Telephone GI office to schedule appointment.                        - The findings and recommendations were discussed with                         the patient. Procedure Code(s):     --- Professional ---                        [id number],  Esophagogastroduodenoscopy, flexible,                         transoral; with biopsy, single or multiple Diagnosis Code(s):     --- Professional ---                        K21.9, Gastro-esophageal reflux disease without                         esophagitis                        D50.9, Iron deficiency anemia, unspecified                        K29.70, Gastritis, unspecified, without bleeding CPT copyright 2019 American Medical Association. All rights reserved. The codes documented in this report are preliminary and upon coder review may  be revised to meet current compliance requirements. Efrain Sella MD, MD 10/28/2021 10:48:11 AM This report has been signed electronically. Number of Addenda: 0 Note Initiated On: 10/28/2021 10:17 AM Estimated Blood Loss:  Estimated blood loss: none.      [facility]

## 2021-10-28 NOTE — Anesthesia Procedure Notes (Signed)
Procedure Name: General with mask airway Date/Time: 10/28/2021 10:49 AM Performed by: Kelton Pillar, CRNA Pre-anesthesia Checklist: Patient identified, Emergency Drugs available, Suction available and Patient being monitored Patient Re-evaluated:Patient Re-evaluated prior to induction Oxygen Delivery Method: Simple face mask Induction Type: IV induction Placement Confirmation: positive ETCO2 and CO2 detector Dental Injury: Teeth and Oropharynx as per pre-operative assessment

## 2021-10-28 NOTE — Transfer of Care (Signed)
Immediate Anesthesia Transfer of Care Note  Patient: Cheryl Wallace  Procedure(s) Performed: ESOPHAGOGASTRODUODENOSCOPY (EGD) WITH PROPOFOL  Patient Location: Endoscopy Unit  Anesthesia Type:General  Level of Consciousness: awake, drowsy and patient cooperative  Airway & Oxygen Therapy: Patient Spontanous Breathing and Patient connected to face mask oxygen  Post-op Assessment: Report given to RN and Post -op Vital signs reviewed and stable  Post vital signs: Reviewed and stable  Last Vitals:  Vitals Value Taken Time  BP 150/83 10/28/21 1115  Temp 35.9 C 10/28/21 1046  Pulse 88 10/28/21 1117  Resp 20 10/28/21 1117  SpO2 95 % 10/28/21 1117  Vitals shown include unvalidated device data.  Last Pain:  Vitals:   10/28/21 1116  TempSrc:   PainSc: 0-No pain         Complications: No notable events documented.

## 2021-10-28 NOTE — Interval H&P Note (Signed)
History and Physical Interval Note:  10/28/2021 10:26 AM  Cheryl Wallace  has presented today for surgery, with the diagnosis of IDA & GERD.  The various methods of treatment have been discussed with the patient and family. After consideration of risks, benefits and other options for treatment, the patient has consented to  Procedure(s) with comments: ESOPHAGOGASTRODUODENOSCOPY (EGD) WITH PROPOFOL (N/A) - DM as a surgical intervention.  The patient's history has been reviewed, patient examined, no change in status, stable for surgery.  I have reviewed the patient's chart and labs.  Questions were answered to the patient's satisfaction.     [place], [place]

## 2021-10-28 NOTE — Anesthesia Postprocedure Evaluation (Signed)
Anesthesia Post Note  Patient: Cheryl Wallace  Procedure(s) Performed: ESOPHAGOGASTRODUODENOSCOPY (EGD) WITH PROPOFOL  Patient location during evaluation: PACU Anesthesia Type: General Level of consciousness: awake and alert Pain management: pain level controlled Vital Signs Assessment: post-procedure vital signs reviewed and stable Respiratory status: spontaneous breathing, nonlabored ventilation and respiratory function stable Cardiovascular status: blood pressure returned to baseline and stable Postop Assessment: no apparent nausea or vomiting Anesthetic complications: no   No notable events documented.   Last Vitals:  Vitals:   10/28/21 1046 10/28/21 1106  BP: (!) 154/91 (!) 155/93  Pulse:    Resp:    Temp: (!) 35.9 C   SpO2:      Last Pain:  Vitals:   10/28/21 1116  TempSrc:   PainSc: 0-No pain                 [place] O[place]

## 2021-10-29 ENCOUNTER — Encounter: Payer: Self-pay | Admitting: Internal Medicine

## 2021-10-29 LAB — SURGICAL PATHOLOGY

## 2021-10-30 ENCOUNTER — Ambulatory Visit: Payer: BC Managed Care – PPO | Admitting: Internal Medicine

## 2021-11-03 DIAGNOSIS — M25511 Pain in right shoulder: Secondary | ICD-10-CM | POA: Diagnosis not present

## 2021-11-03 DIAGNOSIS — G8929 Other chronic pain: Secondary | ICD-10-CM | POA: Diagnosis not present

## 2021-11-05 DIAGNOSIS — R059 Cough, unspecified: Secondary | ICD-10-CM | POA: Diagnosis not present

## 2021-11-05 DIAGNOSIS — J9601 Acute respiratory failure with hypoxia: Secondary | ICD-10-CM | POA: Diagnosis not present

## 2021-11-05 DIAGNOSIS — R0902 Hypoxemia: Secondary | ICD-10-CM | POA: Diagnosis not present

## 2021-11-06 DIAGNOSIS — G8929 Other chronic pain: Secondary | ICD-10-CM | POA: Diagnosis not present

## 2021-11-06 DIAGNOSIS — M7581 Other shoulder lesions, right shoulder: Secondary | ICD-10-CM | POA: Diagnosis not present

## 2021-11-06 DIAGNOSIS — M7551 Bursitis of right shoulder: Secondary | ICD-10-CM | POA: Diagnosis not present

## 2021-11-06 DIAGNOSIS — S46011D Strain of muscle(s) and tendon(s) of the rotator cuff of right shoulder, subsequent encounter: Secondary | ICD-10-CM | POA: Diagnosis not present

## 2021-11-06 DIAGNOSIS — M67911 Unspecified disorder of synovium and tendon, right shoulder: Secondary | ICD-10-CM | POA: Diagnosis not present

## 2021-11-06 DIAGNOSIS — M7521 Bicipital tendinitis, right shoulder: Secondary | ICD-10-CM | POA: Diagnosis not present

## 2021-11-06 DIAGNOSIS — M75121 Complete rotator cuff tear or rupture of right shoulder, not specified as traumatic: Secondary | ICD-10-CM | POA: Diagnosis not present

## 2021-11-06 DIAGNOSIS — M25511 Pain in right shoulder: Secondary | ICD-10-CM | POA: Diagnosis not present

## 2021-11-07 ENCOUNTER — Other Ambulatory Visit: Payer: Self-pay | Admitting: Internal Medicine

## 2021-11-09 DIAGNOSIS — G8929 Other chronic pain: Secondary | ICD-10-CM | POA: Diagnosis not present

## 2021-11-09 DIAGNOSIS — M25511 Pain in right shoulder: Secondary | ICD-10-CM | POA: Diagnosis not present

## 2021-11-12 ENCOUNTER — Other Ambulatory Visit: Payer: Self-pay | Admitting: Internal Medicine

## 2021-11-12 DIAGNOSIS — M25511 Pain in right shoulder: Secondary | ICD-10-CM | POA: Diagnosis not present

## 2021-11-12 DIAGNOSIS — G8929 Other chronic pain: Secondary | ICD-10-CM | POA: Diagnosis not present

## 2021-11-16 DIAGNOSIS — G8929 Other chronic pain: Secondary | ICD-10-CM | POA: Diagnosis not present

## 2021-11-16 DIAGNOSIS — M25511 Pain in right shoulder: Secondary | ICD-10-CM | POA: Diagnosis not present

## 2021-11-20 ENCOUNTER — Other Ambulatory Visit: Payer: Self-pay

## 2021-11-20 ENCOUNTER — Encounter: Payer: Self-pay | Admitting: Internal Medicine

## 2021-11-20 ENCOUNTER — Ambulatory Visit (INDEPENDENT_AMBULATORY_CARE_PROVIDER_SITE_OTHER): Payer: Medicare HMO | Admitting: Internal Medicine

## 2021-11-20 VITALS — BP 132/78 | HR 78 | Temp 98.2°F | Ht 65.0 in | Wt 220.8 lb

## 2021-11-20 DIAGNOSIS — I10 Essential (primary) hypertension: Secondary | ICD-10-CM

## 2021-11-20 DIAGNOSIS — E1121 Type 2 diabetes mellitus with diabetic nephropathy: Secondary | ICD-10-CM | POA: Diagnosis not present

## 2021-11-20 DIAGNOSIS — Z1231 Encounter for screening mammogram for malignant neoplasm of breast: Secondary | ICD-10-CM

## 2021-11-20 DIAGNOSIS — E785 Hyperlipidemia, unspecified: Secondary | ICD-10-CM

## 2021-11-20 DIAGNOSIS — Z6838 Body mass index (BMI) 38.0-38.9, adult: Secondary | ICD-10-CM

## 2021-11-20 DIAGNOSIS — E1169 Type 2 diabetes mellitus with other specified complication: Secondary | ICD-10-CM | POA: Diagnosis not present

## 2021-11-20 DIAGNOSIS — D509 Iron deficiency anemia, unspecified: Secondary | ICD-10-CM

## 2021-11-20 LAB — CBC WITH DIFFERENTIAL/PLATELET
Basophils Absolute: 0 10*3/uL (ref 0.0–0.1)
Basophils Relative: 0.5 % (ref 0.0–3.0)
Eosinophils Absolute: 0.3 10*3/uL (ref 0.0–0.7)
Eosinophils Relative: 4.5 % (ref 0.0–5.0)
HCT: 39.8 % (ref 36.0–46.0)
Hemoglobin: 13.1 g/dL (ref 12.0–15.0)
Lymphocytes Relative: 40.7 % (ref 12.0–46.0)
Lymphs Abs: 2.5 10*3/uL (ref 0.7–4.0)
MCHC: 32.8 g/dL (ref 30.0–36.0)
MCV: 87.6 fl (ref 78.0–100.0)
Monocytes Absolute: 0.5 10*3/uL (ref 0.1–1.0)
Monocytes Relative: 7.6 % (ref 3.0–12.0)
Neutro Abs: 2.9 10*3/uL (ref 1.4–7.7)
Neutrophils Relative %: 46.7 % (ref 43.0–77.0)
Platelets: 275 10*3/uL (ref 150.0–400.0)
RBC: 4.55 Mil/uL (ref 3.87–5.11)
RDW: 15.3 % (ref 11.5–15.5)
WBC: 6.2 10*3/uL (ref 4.0–10.5)

## 2021-11-20 LAB — COMPREHENSIVE METABOLIC PANEL
ALT: 13 U/L (ref 0–35)
AST: 13 U/L (ref 0–37)
Albumin: 3.8 g/dL (ref 3.5–5.2)
Alkaline Phosphatase: 79 U/L (ref 39–117)
BUN: 22 mg/dL (ref 6–23)
CO2: 27 mEq/L (ref 19–32)
Calcium: 9.4 mg/dL (ref 8.4–10.5)
Chloride: 106 mEq/L (ref 96–112)
Creatinine, Ser: 0.76 mg/dL (ref 0.40–1.20)
GFR: 80.15 mL/min (ref >60.00)
Glucose, Bld: 116 mg/dL — ABNORMAL HIGH (ref 70–99)
Potassium: 3.8 mEq/L (ref 3.5–5.1)
Sodium: 140 mEq/L (ref 135–145)
Total Bilirubin: 0.4 mg/dL (ref 0.2–1.2)
Total Protein: 6.6 g/dL (ref 6.0–8.3)

## 2021-11-20 LAB — LIPID PANEL
Cholesterol: 109 mg/dL (ref 0–200)
HDL: 46.5 mg/dL (ref >39.00)
LDL Cholesterol: 39 mg/dL (ref 0–99)
NonHDL: 62.49
Total CHOL/HDL Ratio: 2
Triglycerides: 119 mg/dL (ref 0.0–149.0)
VLDL: 23.8 mg/dL (ref 0.0–40.0)

## 2021-11-20 LAB — HEMOGLOBIN A1C: Hgb A1c MFr Bld: 6.9 % — ABNORMAL HIGH (ref 4.6–6.5)

## 2021-11-20 LAB — IBC + FERRITIN
Ferritin: 43.7 ng/mL (ref 10.0–291.0)
Iron: 66 ug/dL (ref 42–145)
Saturation Ratios: 15.5 % — ABNORMAL LOW (ref 20.0–50.0)
TIBC: 425.6 ug/dL (ref 250.0–450.0)
Transferrin: 304 mg/dL (ref 212.0–360.0)

## 2021-11-20 LAB — MICROALBUMIN / CREATININE URINE RATIO
Creatinine,U: 84.4 mg/dL
Microalb Creat Ratio: 7.2 mg/g (ref 0.0–30.0)
Microalb, Ur: 6.1 mg/dL — ABNORMAL HIGH (ref 0.0–1.9)

## 2021-11-20 MED ORDER — TRAMADOL HCL 50 MG PO TABS: 50.0000 mg | ORAL_TABLET | Freq: Four times a day (QID) | ORAL | 0 refills | Status: DC | PRN

## 2021-11-20 MED ORDER — MUPIROCIN CALCIUM 2 % EX CREA: 1.0000 "application " | TOPICAL_CREAM | Freq: Two times a day (BID) | CUTANEOUS | 0 refills | Status: DC

## 2021-11-20 MED ORDER — ZOSTER VAC RECOMB ADJUVANTED 50 MCG/0.5ML IM SUSR: 0.5000 mL | Freq: Once | INTRAMUSCULAR | 1 refills | Status: AC

## 2021-11-20 MED ORDER — TRIAMCINOLONE ACETONIDE 0.1 % EX CREA: 1.0000 "application " | TOPICAL_CREAM | Freq: Two times a day (BID) | CUTANEOUS | 0 refills | Status: DC

## 2021-11-20 NOTE — Patient Instructions (Signed)
I have prescribed tramadol for use AT NIGHT ONLY  (ignore the instructions on the bottle) if needed for shoulder pain  ? ?You can combine with tylenol.  ? ? ?The  Shingrix vaccines are now  COVERED BY MEDICARE if you get them at your pharmacy as of  January 1.  ? ?You can expect 24 hours of flu like symptoms after receiving the shingles vaccine, so plan accordingly.    ?

## 2021-11-20 NOTE — Assessment & Plan Note (Addendum)
She did not tolerate the lowest dose of Ozempic.  I have addressed  BMI and recommended a low glycemic index diet utilizing smaller more frequent meals to increase metabolism.  I have also recommended that patient start exercising with a goal of 30 minutes of aerobic exercise a minimum of 5 days per week.  ? ?

## 2021-11-20 NOTE — Progress Notes (Signed)
? ?Subjective:  ?Patient ID: Cheryl Wallace, female    DOB: Jan 07, 1951  Age: 71 y.o. MRN: [id number] ? ?CC: The primary encounter diagnosis was Essential hypertension. Diagnoses of Type 2 diabetes mellitus with nephropathy ([place]), Hyperlipidemia associated with type 2 diabetes mellitus ([place]), Encounter for screening mammogram for malignant neoplasm of breast, Iron deficiency anemia, unspecified iron deficiency anemia type, and Class 2 severe obesity due to excess calories with serious comorbidity and body mass index (BMI) of 38.0 to 38.9 in adult [facility]) were also pertinent to this visit. ? ? ?This visit occurred during the SARS-CoV-2 public health emergency.  Safety protocols were in place, including screening questions prior to the visit, additional usage of staff PPE, and extensive cleaning of exam room while observing appropriate contact time as indicated for disinfecting solutions.   ? ?HPI ?Cheryl Wallace presents for  ?Chief Complaint  ?Patient presents with  ? Follow-up  ?  3 month follow up on diabetes  ? ?1) T2DM:  She  feels generally well,  But is not  exercising regularly  due to right shoulder pain (see below) .   Home readings using an old meter have been < 90 fasting.,  and 82 post prandially.  She has ordered a new meter.  Checking  blood sugars less than once daily at variable times, usually only if she feels she may be having a hypoglycemic event. .  BS have been under 130 fasting and < 150 post prandially.  Denies any recent hypoglyemic events.  Taking   medications as directed. Following a carbohydrate modified diet 6 days per week. Denies numbness, burning and tingling of extremities. Appetite is good.   ? ?2) shoulder pain (right):  seen for follow up by Dr Roland Rack last week:  she is 3 months status post a limited arthroscopic debridement, arthroscopic subacromial decompression, mini-open rotator cuff repair, and mini-open biceps tenodesis of the right shoulder"  ultrasound ordered and subacromial  u/s guided injection given for inflammation seen on u/s.  Pain is somewhat better but still present.  Doing home therapy exercises ,  no weights and outpatient PT at [facility].    Not sleeping well due to persistent shoulder pain.  Has maxed out tylenol with no relief.  Discussed tramadol use.  ? ?3) IDA:  underwent EGD , diagnostic in Feb.  Received iv iron and IDA has resolved.  Per GI [facility]:  Dr. Alice Reichert and I discussed this case on 11/10/2021 and decided not to pursue capsule endoscopy at this time since her IDA that has resolved. Hemoglobin was 13.8, hematocrit 43.6, MCV 86.3 on 10/06/2021. She has no melena. Last received IV iron in February 2022. She is not taking iron  ? ?4) Hypertension: patient checks blood pressure twice weekly at home.  Readings have been for the most part < 140/80 at rest . Patient is following a reduce salt diet most days and is taking medications as prescribed (amlodipine, valsartan and carvedilol) ? ?Outpatient Medications Prior to Visit  ?Medication Sig Dispense Refill  ? acetaminophen (TYLENOL) 500 MG tablet Take 1,000 mg by mouth every 6 (six) hours as needed for mild pain or moderate pain.    ? amLODipine (NORVASC) 10 MG tablet TAKE 1 TABLET BY MOUTH EVERYDAY AT BEDTIME 90 tablet 1  ? aspirin 81 MG chewable tablet Chew 81 mg by mouth daily.    ? atorvastatin (LIPITOR) 20 MG tablet TAKE 1 TABLET BY MOUTH EVERY DAY 90 tablet 1  ? carvedilol (COREG) 3.125  MG tablet TAKE 1 TABLET (3.125 MG TOTAL) BY MOUTH 2 (TWO) TIMES DAILY WITH A MEAL. 180 tablet 1  ? cholecalciferol (VITAMIN D) 1000 UNITS tablet Take 1,000 Units by mouth daily.     ? diphenhydrAMINE (BENADRYL) 50 MG tablet Take 50 mg by mouth at bedtime.    ? DULoxetine HCl 40 MG CPEP TAKE 1 CAPSULE BY MOUTH EVERY DAY 90 capsule 1  ? fexofenadine (ALLEGRA) 180 MG tablet Take 180 mg by mouth every morning.    ? fluticasone (FLONASE) 50 MCG/ACT nasal spray Place 2 sprays into both nostrils daily as needed for allergies.    ?  glipiZIDE (GLUCOTROL) 10 MG tablet TAKE 1 TABLET (10 MG TOTAL) BY MOUTH 2 (TWO) TIMES DAILY BEFORE A MEAL. 180 tablet 1  ? JARDIANCE 10 MG TABS tablet TAKE 1 TABLET BY MOUTH EVERY DAY 90 tablet 1  ? metFORMIN (GLUCOPHAGE) 1000 MG tablet TAKE 1 TABLET BY MOUTH 2 TIMES DAILY WITH A MEAL. 180 tablet 1  ? omeprazole (PRILOSEC) 40 MG capsule Take 40 mg by mouth 2 (two) times daily.    ? tiZANidine (ZANAFLEX) 2 MG tablet TAKE 1 TABLET BY MOUTH EVERY 6 HOURS AS NEEDED FOR MUSCLE SPASMS. 360 tablet 1  ? valsartan (DIOVAN) 40 MG tablet TAKE 1 TABLET BY MOUTH EVERYDAY AT BEDTIME 90 tablet 1  ? zolpidem (AMBIEN) 5 MG tablet TAKE 1 TABLET BY MOUTH AT BEDTIME AS NEEDED FOR SLEEP. 30 tablet 5  ? ?No facility-administered medications prior to visit.  ? ? ?Review of Systems; ? ?Patient denies headache, fevers, malaise, unintentional weight loss, skin rash, eye pain, sinus congestion and sinus pain, sore throat, dysphagia,  hemoptysis , cough, dyspnea, wheezing, chest pain, palpitations, orthopnea, edema, abdominal pain, nausea, melena, diarrhea, constipation, flank pain, dysuria, hematuria, urinary  Frequency, nocturia, numbness, tingling, seizures,  Focal weakness, Loss of consciousness,  Tremor, insomnia, depression, anxiety, and suicidal ideation.   ? ? ? ?Objective:  ?BP 132/78 (BP Location: Left Arm, Patient Position: Sitting, Cuff Size: Large)   Pulse 78   Temp 98.2 ?F (36.8 ?C) (Oral)   Ht '5\' 5"'$  (1.651 m)   Wt 220 lb 12.8 oz (100.2 kg)   SpO2 97%   BMI 36.74 kg/m?  ? ?BP Readings from Last 3 Encounters:  ?11/20/21 132/78  ?10/28/21 (!) 155/93  ?10/06/21 (!) 157/81  ? ? ?Wt Readings from Last 3 Encounters:  ?11/20/21 220 lb 12.8 oz (100.2 kg)  ?10/28/21 217 lb 7 oz (98.6 kg)  ?10/06/21 218 lb 12.8 oz (99.2 kg)  ? ? ?General appearance: alert, cooperative and appears stated age ?Ears: normal TM's and external ear canals both ears ?Throat: lips, mucosa, and tongue normal; teeth and gums normal ?Neck: no adenopathy, no  carotid bruit, supple, symmetrical, trachea midline and thyroid not enlarged, symmetric, no tenderness/mass/nodules ?Back: symmetric, no curvature. ROM normal. No CVA tenderness. ?Lungs: clear to auscultation bilaterally ?Heart: regular rate and rhythm, S1, S2 normal, no murmur, click, rub or gallop ?Abdomen: soft, non-tender; bowel sounds normal; no masses,  no organomegaly ?Pulses: 2+ and symmetric ?Skin: Skin color, texture, turgor normal. No rashes or lesions ?Lymph nodes: Cervical, supraclavicular, and axillary nodes normal. ? ?Lab Results  ?Component Value Date  ? HGBA1C 6.9 (H) 11/20/2021  ? HGBA1C 6.5 (A) 07/29/2021  ? HGBA1C 7.7 (H) 03/24/2021  ? ? ?Lab Results  ?Component Value Date  ? CREATININE 0.76 11/20/2021  ? CREATININE 0.83 08/07/2021  ? CREATININE 0.73 08/06/2021  ? ? ?Lab Results  ?  Component Value Date  ? WBC 6.2 11/20/2021  ? HGB 13.1 11/20/2021  ? HCT 39.8 11/20/2021  ? PLT 275.0 11/20/2021  ? GLUCOSE 116 (H) 11/20/2021  ? CHOL 109 11/20/2021  ? TRIG 119.0 11/20/2021  ? HDL 46.50 11/20/2021  ? LDLDIRECT 52.0 09/26/2017  ? LDLCALC 39 11/20/2021  ? ALT 13 11/20/2021  ? AST 13 11/20/2021  ? NA 140 11/20/2021  ? K 3.8 11/20/2021  ? CL 106 11/20/2021  ? CREATININE 0.76 11/20/2021  ? BUN 22 11/20/2021  ? CO2 27 11/20/2021  ? TSH 1.272 06/24/2021  ? HGBA1C 6.9 (H) 11/20/2021  ? MICROALBUR 6.1 (H) 11/20/2021  ? ? ?No results found. ? ?Assessment & Plan:  ? ?Problem List Items Addressed This Visit   ? ? Obesity  ?  She did not tolerate the lowest dose of Ozempic.  I have addressed  BMI and recommended a low glycemic index diet utilizing smaller more frequent meals to increase metabolism.  I have also recommended that patient start exercising with a goal of 30 minutes of aerobic exercise a minimum of 5 days per week.  ? ?  ?  ? Type 2 diabetes mellitus with nephropathy ([place])  ?  Excellent  Control on same regimen of Jardiance 10 mg  , metformin, and glipizide.  She has had a failed trial of Ozempic due to  persistent nausea and vomiting even at lowest dose. Continue glipizide to 10 mg  bid and continue metformin 1000 mg bid .  Eye exam needed; reminder given ? ?Lab Results  ?Component Value Date  ? HGBA1C 6.9 (H) 03/17/

## 2021-11-22 NOTE — Assessment & Plan Note (Signed)
Excellent  Control on same regimen of Jardiance 10 mg  , metformin, and glipizide.  She has had a failed trial of Ozempic due to persistent nausea and vomiting even at lowest dose. Continue glipizide to 10 mg  bid and continue metformin 1000 mg bid .  Eye exam needed; reminder given ? ?Lab Results  ?Component Value Date  ? HGBA1C 6.9 (H) 11/20/2021  ? ?Lab Results  ?Component Value Date  ? MICROALBUR 6.1 (H) 11/20/2021  ? MICROALBUR 6.2 (H) 07/17/2020  ? ? ? ? ?

## 2021-11-22 NOTE — Assessment & Plan Note (Signed)
Well controlled on current 3 drug  regimen. Renal function stable, no changes today.  

## 2021-11-22 NOTE — Assessment & Plan Note (Signed)
Resolved with iv iron infusion. ? ?Lab Results  ?Component Value Date  ? WBC 6.2 11/20/2021  ? HGB 13.1 11/20/2021  ? HCT 39.8 11/20/2021  ? MCV 87.6 11/20/2021  ? PLT 275.0 11/20/2021  ? ? ?

## 2021-11-23 DIAGNOSIS — M25511 Pain in right shoulder: Secondary | ICD-10-CM | POA: Diagnosis not present

## 2021-11-23 DIAGNOSIS — G8929 Other chronic pain: Secondary | ICD-10-CM | POA: Diagnosis not present

## 2021-11-26 DIAGNOSIS — G8929 Other chronic pain: Secondary | ICD-10-CM | POA: Diagnosis not present

## 2021-11-26 DIAGNOSIS — M25511 Pain in right shoulder: Secondary | ICD-10-CM | POA: Diagnosis not present

## 2021-12-03 DIAGNOSIS — M25511 Pain in right shoulder: Secondary | ICD-10-CM | POA: Diagnosis not present

## 2021-12-03 DIAGNOSIS — G8929 Other chronic pain: Secondary | ICD-10-CM | POA: Diagnosis not present

## 2021-12-06 DIAGNOSIS — J9601 Acute respiratory failure with hypoxia: Secondary | ICD-10-CM | POA: Diagnosis not present

## 2021-12-06 DIAGNOSIS — R0902 Hypoxemia: Secondary | ICD-10-CM | POA: Diagnosis not present

## 2021-12-06 DIAGNOSIS — R059 Cough, unspecified: Secondary | ICD-10-CM | POA: Diagnosis not present

## 2021-12-07 DIAGNOSIS — M25511 Pain in right shoulder: Secondary | ICD-10-CM | POA: Diagnosis not present

## 2021-12-07 DIAGNOSIS — G8929 Other chronic pain: Secondary | ICD-10-CM | POA: Diagnosis not present

## 2021-12-10 DIAGNOSIS — G8929 Other chronic pain: Secondary | ICD-10-CM | POA: Diagnosis not present

## 2021-12-10 DIAGNOSIS — M25511 Pain in right shoulder: Secondary | ICD-10-CM | POA: Diagnosis not present

## 2021-12-14 DIAGNOSIS — D631 Anemia in chronic kidney disease: Secondary | ICD-10-CM | POA: Diagnosis not present

## 2021-12-14 DIAGNOSIS — N182 Chronic kidney disease, stage 2 (mild): Secondary | ICD-10-CM | POA: Diagnosis not present

## 2021-12-14 DIAGNOSIS — I10 Essential (primary) hypertension: Secondary | ICD-10-CM | POA: Diagnosis not present

## 2021-12-14 DIAGNOSIS — E1122 Type 2 diabetes mellitus with diabetic chronic kidney disease: Secondary | ICD-10-CM | POA: Diagnosis not present

## 2021-12-15 DIAGNOSIS — M25511 Pain in right shoulder: Secondary | ICD-10-CM | POA: Diagnosis not present

## 2021-12-15 DIAGNOSIS — G8929 Other chronic pain: Secondary | ICD-10-CM | POA: Diagnosis not present

## 2021-12-18 ENCOUNTER — Other Ambulatory Visit: Payer: Self-pay | Admitting: Internal Medicine

## 2021-12-18 DIAGNOSIS — G8929 Other chronic pain: Secondary | ICD-10-CM | POA: Diagnosis not present

## 2021-12-18 DIAGNOSIS — M25511 Pain in right shoulder: Secondary | ICD-10-CM | POA: Diagnosis not present

## 2021-12-24 DIAGNOSIS — M25511 Pain in right shoulder: Secondary | ICD-10-CM | POA: Diagnosis not present

## 2021-12-24 DIAGNOSIS — G8929 Other chronic pain: Secondary | ICD-10-CM | POA: Diagnosis not present

## 2021-12-25 DIAGNOSIS — S46011D Strain of muscle(s) and tendon(s) of the rotator cuff of right shoulder, subsequent encounter: Secondary | ICD-10-CM | POA: Diagnosis not present

## 2021-12-25 DIAGNOSIS — M7501 Adhesive capsulitis of right shoulder: Secondary | ICD-10-CM | POA: Diagnosis not present

## 2021-12-25 DIAGNOSIS — M7521 Bicipital tendinitis, right shoulder: Secondary | ICD-10-CM | POA: Diagnosis not present

## 2021-12-25 DIAGNOSIS — M7581 Other shoulder lesions, right shoulder: Secondary | ICD-10-CM | POA: Diagnosis not present

## 2021-12-28 DIAGNOSIS — G8929 Other chronic pain: Secondary | ICD-10-CM | POA: Diagnosis not present

## 2021-12-28 DIAGNOSIS — M25511 Pain in right shoulder: Secondary | ICD-10-CM | POA: Diagnosis not present

## 2021-12-29 ENCOUNTER — Other Ambulatory Visit: Payer: Self-pay | Admitting: Surgery

## 2021-12-31 DIAGNOSIS — M25511 Pain in right shoulder: Secondary | ICD-10-CM | POA: Diagnosis not present

## 2021-12-31 DIAGNOSIS — G8929 Other chronic pain: Secondary | ICD-10-CM | POA: Diagnosis not present

## 2022-01-01 ENCOUNTER — Other Ambulatory Visit: Payer: Self-pay

## 2022-01-01 ENCOUNTER — Other Ambulatory Visit
Admission: RE | Admit: 2022-01-01 | Discharge: 2022-01-01 | Disposition: A | Discharge status: Home / Self Care | Payer: BC Managed Care – PPO | Source: Ambulatory Visit | Attending: Surgery | Admitting: Surgery

## 2022-01-01 HISTORY — DX: Pneumonia, unspecified organism: J18.9

## 2022-01-01 HISTORY — DX: Unspecified asthma, uncomplicated: J45.909

## 2022-01-01 NOTE — Patient Instructions (Addendum)
Your procedure is scheduled on: 01/06/22 - Wednesday ?Report to the Registration Desk on the 1st floor of the [facility]. ?To find out your arrival time, please call [phone] between 1PM - 3PM on: 01/05/22 - Tuesday ?If your arrival time is 6:00 am, do not arrive prior to that time as the [place] entrance doors do not open until 6:00 am. ? ?REMEMBER: ?Instructions that are not followed completely may result in serious medical risk, up to and including death; or upon the discretion of your surgeon and anesthesiologist your surgery may need to be rescheduled. ? ?Do not eat food after midnight the night before surgery.  ?No gum chewing, lozengers or hard candies. ? ?You may however, drink CLEAR liquids up to 2 hours before you are scheduled to arrive for your surgery. Do not drink anything within 2 hours of your scheduled arrival time. ? ?Type 1 and Type 2 diabetics should only drink water. ? ?TAKE only THESE MEDICATIONS THE MORNING OF SURGERY WITH A SIP OF WATER: ? ?- amLODipine (NORVASC) 10 MG tablet ?- carvedilol (COREG) 3.125 MG tablet ?- fexofenadine (ALLEGRA) 180 MG tablet ?- omeprazole (PRILOSEC) 40 MG capsule, (take one the night before and one on the morning of surgery - helps to prevent nausea after surgery.) ? ? ?Stop your JARDIANCE 10 MG TABS tablet 3 days prior to Surgery-Last dose on 01/05/22 ? ?Stop your metFORMIN (GLUCOPHAGE) 1000 MG tablet 2 days prior to Surgery-Last dose on 01/06/22. ? ?One week prior to surgery: ?Stop Anti-inflammatories (NSAIDS) such as Advil, Aleve, Ibuprofen, Motrin, Naproxen, Naprosyn and Aspirin based products such as Excedrin, Goodys Powder, BC Powder. ? ?Stop ANY OVER THE COUNTER supplements until after surgery. ? ?You may however, continue to take Tylenol if needed for pain up until the day of surgery. ? ?No Alcohol for 24 hours before or after surgery. ? ?No Smoking including e-cigarettes for 24 hours prior to surgery.  ?No chewable tobacco products for at  least 6 hours prior to surgery.  ?No nicotine patches on the day of surgery. ? ?Do not use any "recreational" drugs for at least a week prior to your surgery.  ?Please be advised that the combination of cocaine and anesthesia may have negative outcomes, up to and including death. ?If you test positive for cocaine, your surgery will be cancelled. ? ?On the morning of surgery brush your teeth with toothpaste and water, you may rinse your mouth with mouthwash if you wish. ?Do not swallow any toothpaste or mouthwash. ? ?Use CHG Soap or wipes as directed on instruction sheet. ? ?Do not wear jewelry, make-up, hairpins, clips or nail polish. ? ?Do not wear lotions, powders, or perfumes.  ? ?Do not shave body from the neck down 48 hours prior to surgery just in case you cut yourself which could leave a site for infection.  ?Also, freshly shaved skin may become irritated if using the CHG soap. ? ?Contact lenses, hearing aids and dentures may not be worn into surgery. ? ?Do not bring valuables to the hospital. [facility] is not responsible for any missing/lost belongings or valuables.  ? ?Notify your doctor if there is any change in your medical condition (cold, fever, infection). ? ?Wear comfortable clothing (specific to your surgery type) to the hospital. ? ?After surgery, you can help prevent lung complications by doing breathing exercises.  ?Take deep breaths and cough every 1-2 hours. Your doctor may order a device called an Incentive Spirometer to help you take deep breaths. ?  When coughing or sneezing, hold a pillow firmly against your incision with both hands. This is called ?splinting.? Doing this helps protect your incision. It also decreases belly discomfort. ? ?If you are being admitted to the hospital overnight, leave your suitcase in the car. ?After surgery it may be brought to your room. ? ?If you are being discharged the day of surgery, you will not be allowed to drive home. ?You will need a responsible adult  (18 years or older) to drive you home and stay with you that night.  ? ?If you are taking public transportation, you will need to have a responsible adult (18 years or older) with you. ?Please confirm with your physician that it is acceptable to use public transportation.  ? ?Please call the [facility] at [phone] if you have any questions about these instructions. ? ?Surgery Visitation Policy: ? ?Patients undergoing a surgery or procedure may have two family members or support persons with them as long as the person is not COVID-19 positive or experiencing its symptoms.  ? ?Inpatient Visitation:   ? ?Visiting hours are 7 a.m. to 8 p.m. ?Up to four visitors are allowed at one time in a patient room, including children. The visitors may rotate out with other people during the day. One designated support person (adult) may remain overnight.  ?

## 2022-01-04 ENCOUNTER — Inpatient Hospital Stay: Payer: Medicare HMO | Attending: Oncology

## 2022-01-04 DIAGNOSIS — D509 Iron deficiency anemia, unspecified: Secondary | ICD-10-CM | POA: Diagnosis not present

## 2022-01-04 DIAGNOSIS — D508 Other iron deficiency anemias: Secondary | ICD-10-CM

## 2022-01-04 LAB — CBC WITH DIFFERENTIAL/PLATELET
Abs Immature Granulocytes: 0.02 10*3/uL (ref 0.00–0.07)
Basophils Absolute: 0 10*3/uL (ref 0.0–0.1)
Basophils Relative: 1 %
Eosinophils Absolute: 0.3 10*3/uL (ref 0.0–0.5)
Eosinophils Relative: 5 %
HCT: 42.5 % (ref 36.0–46.0)
Hemoglobin: 13.6 g/dL (ref 12.0–15.0)
Immature Granulocytes: 0 %
Lymphocytes Relative: 32 %
Lymphs Abs: 2 10*3/uL (ref 0.7–4.0)
MCH: 29 pg (ref 26.0–34.0)
MCHC: 32 g/dL (ref 30.0–36.0)
MCV: 90.6 fL (ref 80.0–100.0)
Monocytes Absolute: 0.4 10*3/uL (ref 0.1–1.0)
Monocytes Relative: 6 %
Neutro Abs: 3.4 10*3/uL (ref 1.7–7.7)
Neutrophils Relative %: 56 %
Platelets: 272 10*3/uL (ref 150–400)
RBC: 4.69 MIL/uL (ref 3.87–5.11)
RDW: 14.2 % (ref 11.5–15.5)
WBC: 6.1 10*3/uL (ref 4.0–10.5)
nRBC: 0 % (ref 0.0–0.2)

## 2022-01-04 LAB — IRON AND TIBC
Iron: 63 ug/dL (ref 28–170)
Saturation Ratios: 15 % (ref 10.4–31.8)
TIBC: 428 ug/dL (ref 250–450)
UIBC: 365 ug/dL

## 2022-01-04 LAB — FERRITIN: Ferritin: 27 ng/mL (ref 11–307)

## 2022-01-06 ENCOUNTER — Ambulatory Visit: Payer: Medicare HMO | Admitting: Anesthesiology

## 2022-01-06 ENCOUNTER — Encounter
Admission: RE | Disposition: A | Discharge status: Home / Self Care | Payer: Self-pay | Source: Home / Self Care | Attending: Surgery

## 2022-01-06 ENCOUNTER — Other Ambulatory Visit: Payer: Self-pay

## 2022-01-06 ENCOUNTER — Ambulatory Visit
Admission: RE | Admit: 2022-01-06 | Discharge: 2022-01-06 | Disposition: A | Discharge status: Home / Self Care | Payer: Medicare HMO | Attending: Surgery | Admitting: Surgery

## 2022-01-06 ENCOUNTER — Encounter: Payer: Self-pay | Admitting: Surgery

## 2022-01-06 DIAGNOSIS — F32A Depression, unspecified: Secondary | ICD-10-CM | POA: Diagnosis not present

## 2022-01-06 DIAGNOSIS — N189 Chronic kidney disease, unspecified: Secondary | ICD-10-CM | POA: Insufficient documentation

## 2022-01-06 DIAGNOSIS — Z87891 Personal history of nicotine dependence: Secondary | ICD-10-CM | POA: Insufficient documentation

## 2022-01-06 DIAGNOSIS — M7581 Other shoulder lesions, right shoulder: Secondary | ICD-10-CM | POA: Diagnosis not present

## 2022-01-06 DIAGNOSIS — Z7984 Long term (current) use of oral hypoglycemic drugs: Secondary | ICD-10-CM | POA: Diagnosis not present

## 2022-01-06 DIAGNOSIS — Z6836 Body mass index (BMI) 36.0-36.9, adult: Secondary | ICD-10-CM | POA: Diagnosis not present

## 2022-01-06 DIAGNOSIS — E669 Obesity, unspecified: Secondary | ICD-10-CM | POA: Diagnosis not present

## 2022-01-06 DIAGNOSIS — E1122 Type 2 diabetes mellitus with diabetic chronic kidney disease: Secondary | ICD-10-CM | POA: Insufficient documentation

## 2022-01-06 DIAGNOSIS — I129 Hypertensive chronic kidney disease with stage 1 through stage 4 chronic kidney disease, or unspecified chronic kidney disease: Secondary | ICD-10-CM | POA: Diagnosis not present

## 2022-01-06 DIAGNOSIS — M7501 Adhesive capsulitis of right shoulder: Secondary | ICD-10-CM | POA: Diagnosis not present

## 2022-01-06 DIAGNOSIS — K219 Gastro-esophageal reflux disease without esophagitis: Secondary | ICD-10-CM | POA: Diagnosis not present

## 2022-01-06 DIAGNOSIS — Z79899 Other long term (current) drug therapy: Secondary | ICD-10-CM | POA: Diagnosis not present

## 2022-01-06 DIAGNOSIS — E1169 Type 2 diabetes mellitus with other specified complication: Secondary | ICD-10-CM

## 2022-01-06 HISTORY — PX: CLOSED MANIPULATION SHOULDER WITH STERIOD INJECTION: SHX5611

## 2022-01-06 LAB — GLUCOSE, CAPILLARY
Glucose-Capillary: 188 mg/dL — ABNORMAL HIGH (ref 70–99)
Glucose-Capillary: 163 mg/dL — ABNORMAL HIGH (ref 70–99)

## 2022-01-06 SURGERY — CLOSED MANIPULATION SHOULDER WITH STEROID INJECTION
Anesthesia: General | Site: Shoulder | Laterality: Right

## 2022-01-06 MED ORDER — DEXAMETHASONE SODIUM PHOSPHATE 10 MG/ML IJ SOLN
INTRAMUSCULAR | Status: AC
  Filled 2022-01-06: qty 1

## 2022-01-06 MED ORDER — ONDANSETRON HCL 4 MG/2ML IJ SOLN
INTRAMUSCULAR | Status: AC
  Filled 2022-01-06: qty 2

## 2022-01-06 MED ORDER — BUPIVACAINE-EPINEPHRINE (PF) 0.25% -1:200000 IJ SOLN
INTRAMUSCULAR | Status: AC
  Filled 2022-01-06: qty 30

## 2022-01-06 MED ORDER — TRIAMCINOLONE ACETONIDE 40 MG/ML IJ SUSP
INTRAMUSCULAR | Status: DC | PRN
  Administered 2022-01-06: 10 mL

## 2022-01-06 MED ORDER — MIDAZOLAM HCL 2 MG/2ML IJ SOLN
INTRAMUSCULAR | Status: DC | PRN
Start: 2022-01-06 — End: 2022-01-06
  Administered 2022-01-06: 1 mg via INTRAVENOUS

## 2022-01-06 MED ORDER — DULOXETINE HCL 40 MG PO CPEP: 40.0000 mg | ORAL_CAPSULE | Freq: Every day | ORAL | Status: DC

## 2022-01-06 MED ORDER — LIDOCAINE HCL (PF) 2 % IJ SOLN
INTRAMUSCULAR | Status: AC
  Filled 2022-01-06: qty 5

## 2022-01-06 MED ORDER — CHLORHEXIDINE GLUCONATE 0.12 % MT SOLN
OROMUCOSAL | Status: AC
  Administered 2022-01-06: 15 mL via OROMUCOSAL
  Filled 2022-01-06: qty 15

## 2022-01-06 MED ORDER — CHLORHEXIDINE GLUCONATE 0.12 % MT SOLN: 15.0000 mL | Freq: Once | OROMUCOSAL | Status: AC

## 2022-01-06 MED ORDER — SODIUM CHLORIDE 0.9 % IV SOLN: INTRAVENOUS | Status: DC

## 2022-01-06 MED ORDER — ORAL CARE MOUTH RINSE: 15.0000 mL | Freq: Once | OROMUCOSAL | Status: AC

## 2022-01-06 MED ORDER — FENTANYL CITRATE (PF) 100 MCG/2ML IJ SOLN
INTRAMUSCULAR | Status: AC
  Filled 2022-01-06: qty 2

## 2022-01-06 MED ORDER — ATORVASTATIN CALCIUM 20 MG PO TABS: 20.0000 mg | ORAL_TABLET | Freq: Every day | ORAL | Status: DC

## 2022-01-06 MED ORDER — PROPOFOL 10 MG/ML IV BOLUS
INTRAVENOUS | Status: AC
  Filled 2022-01-06: qty 20

## 2022-01-06 MED ORDER — OXYCODONE HCL 5 MG PO TABS: 5.0000 mg | ORAL_TABLET | ORAL | 0 refills | Status: DC | PRN

## 2022-01-06 MED ORDER — EMPAGLIFLOZIN 10 MG PO TABS: 10.0000 mg | ORAL_TABLET | Freq: Every day | ORAL | Status: DC

## 2022-01-06 MED ORDER — PROPOFOL 10 MG/ML IV BOLUS
INTRAVENOUS | Status: DC | PRN
  Administered 2022-01-06: 20 mg via INTRAVENOUS
  Administered 2022-01-06: 50 mg via INTRAVENOUS

## 2022-01-06 MED ORDER — MIDAZOLAM HCL 2 MG/2ML IJ SOLN
INTRAMUSCULAR | Status: AC
  Filled 2022-01-06: qty 2

## 2022-01-06 MED ORDER — LIDOCAINE HCL (CARDIAC) PF 100 MG/5ML IV SOSY
PREFILLED_SYRINGE | INTRAVENOUS | Status: DC | PRN
  Administered 2022-01-06: 80 mg via INTRAVENOUS

## 2022-01-06 MED ORDER — FENTANYL CITRATE (PF) 100 MCG/2ML IJ SOLN
INTRAMUSCULAR | Status: DC | PRN
  Administered 2022-01-06: 50 ug via INTRAVENOUS

## 2022-01-06 MED ORDER — TRIAMCINOLONE ACETONIDE 40 MG/ML IJ SUSP
INTRAMUSCULAR | Status: AC
  Filled 2022-01-06: qty 1

## 2022-01-06 SURGICAL SUPPLY — 8 items
BNDG ADH 1X3 SHEER STRL LF (GAUZE/BANDAGES/DRESSINGS) ×2 IMPLANT
BNDG ADH THN 3X1 STRL LF (GAUZE/BANDAGES/DRESSINGS) ×1
NDL HYPO 21X1.5 SAFETY (NEEDLE) ×1 IMPLANT
NEEDLE HYPO 21X1.5 SAFETY (NEEDLE) ×2 IMPLANT
PAD ALCOHOL SWAB (MISCELLANEOUS) ×4 IMPLANT
SLING ARM LRG DEEP (SOFTGOODS) ×2 IMPLANT
SLING ARM M TX990204 (SOFTGOODS) ×1 IMPLANT
SYR 10ML LL (SYRINGE) ×2 IMPLANT

## 2022-01-06 NOTE — Op Note (Signed)
01/06/2022 ? ?10:23 AM ? ?Patient:   Cheryl Wallace ? ?Pre-Op Diagnosis:   Secondary adhesive capsulitis, right shoulder. ? ?Post-Op Diagnosis:   Same ? ?Procedure:   Manipulation under anesthesia with steroid injection, right shoulder. ? ?Surgeon:   Pascal Lux, MD ? ?Assistant:   None ? ?Anesthesia:   IV sedation  ? ?Findings:   As above. Prior to manipulation, the right shoulder could be forward flexed to 140? and abducted to 135?[name] At 90? of abduction, the shoulder could be externally rotated to 70? and internally rotated to 50?[name] Following manipulation, the shoulder could be forward flexed to 165?, abducted to 160? and, at 90? of abduction, externally rotated to 90? and internally rotated to 70?. ? ?Complications:   None ? ?EBL:   0 cc ? ?Fluids:   300 cc crystalloid ? ?TT:   None ? ?Drains:   None ? ?Closure:   None ? ?Brief Clinical Note:   The patient is a 71 year old female who is now 5 months status post a rotator cuff repair with decompression and biceps tenodesis. Despite extensive physical therapy, the patient continues to have difficulty regaining shoulder range of motion. The patient's history and examination are consistent with adhesive capsulitis. The patient presents at this time for a manipulation under anesthesia with steroid injection of the right shoulder. ? ?Procedure:   The patient was brought into the operating room and lain in the supine position. After adequate IV sedation was achieved, a timeout was performed to verify the correct surgical site. The right shoulder was gently manipulated in both abduction and external rotation, as well as adduction and internal rotation. Several palpable and audible pops were heard as the scar tissue released, permitting full range of motion of the shoulder. The glenohumeral joint was injected sterilely using 1 cc of Kenalog-40 and 9 cc of 0.25% Sensorcaine with epinephrine before the patient was placed into a sling. The patient was then awakened and  returned to the recovery room in satisfactory condition after tolerating the procedure well. ?

## 2022-01-06 NOTE — Transfer of Care (Signed)
Immediate Anesthesia Transfer of Care Note ? ?Patient: Cheryl Wallace ? ?Procedure(s) Performed: CLOSED MANIPULATION SHOULDER WITH STEROID INJECTION (Right: Shoulder) ? ?Patient Location: PACU ? ?Anesthesia Type:General ? ?Level of Consciousness: awake ? ?Airway & Oxygen Therapy: Patient Spontanous Breathing and Patient connected to face mask oxygen ? ?Post-op Assessment: Report given to RN and Post -op Vital signs reviewed and stable ? ?Post vital signs: Reviewed and stable ? ?Last Vitals:  ?Vitals Value Taken Time  ?BP 139/77 01/06/22 1010  ?Temp 36.1 ?C 01/06/22 1009  ?Pulse 80 01/06/22 1010  ?Resp 15 01/06/22 1010  ?SpO2 98 % 01/06/22 1010  ? ? ?Last Pain:  ?Vitals:  ? 01/06/22 0844  ?TempSrc: Temporal  ?PainSc: 4   ?   ? ?  ? ?Complications: No notable events documented. ?

## 2022-01-06 NOTE — Anesthesia Preprocedure Evaluation (Addendum)
Anesthesia Evaluation  ?Patient identified by MRN, date of birth, ID band ?Patient awake ? ? ? ?Reviewed: ?Allergy & Precautions, NPO status , Patient's Chart, lab work & pertinent test results ? ?History of Anesthesia Complications ?Negative for: history of anesthetic complications ? ?Airway ?Mallampati: II ? ? ?Neck ROM: Full ? ? ? Dental ? ? ?Missing all but one tooth:   ?Pulmonary ?former smoker (quit 1985),  ?  ?Pulmonary exam normal ?breath sounds clear to auscultation ? ? ? ? ? ? Cardiovascular ?hypertension, Normal cardiovascular exam ?Rhythm:Regular Rate:Normal ? ?ECG 08/10/21: normal ?  ?Neuro/Psych ?Seizures - (x1 at age 55),  PSYCHIATRIC DISORDERS Depression   ? GI/Hepatic ?GERD  ,  ?Endo/Other  ?diabetes, Type 2Obesity  ? Renal/GU ?Renal disease (CKD)  ? ?  ?Musculoskeletal ? ?(+) Arthritis ,  ? Abdominal ?  ?Peds ? Hematology ? ?(+) Blood dyscrasia, anemia ,   ?Anesthesia Other Findings ? ? Reproductive/Obstetrics ? ?  ? ? ? ? ? ? ? ? ? ? ? ? ? ?  ?  ? ? ? ? ? ? ? ?Anesthesia Physical ?Anesthesia Plan ? ?ASA: 2 ? ?Anesthesia Plan: General  ? ?Post-op Pain Management:   ? ?Induction: Intravenous ? ?PONV Risk Score and Plan: 3 and Propofol infusion, TIVA, Treatment may vary due to age or medical condition and Ondansetron ? ?Airway Management Planned: Natural Airway ? ?Additional Equipment:  ? ?Intra-op Plan:  ? ?Post-operative Plan:  ? ?Informed Consent: I have reviewed the patients History and Physical, chart, labs and discussed the procedure including the risks, benefits and alternatives for the proposed anesthesia with the patient or authorized representative who has indicated his/her understanding and acceptance.  ? ? ? ? ? ?Plan Discussed with: CRNA ? ?Anesthesia Plan Comments: (LMA/GETA backup discussed.  Patient consented for risks of anesthesia including but not limited to:  ?- adverse reactions to medications ?- damage to eyes, teeth, lips or other oral mucosa ?-  nerve damage due to positioning  ?- sore throat or hoarseness ?- damage to heart, brain, nerves, lungs, other parts of body or loss of life ? ?Informed patient about role of CRNA in peri- and intra-operative care.  Patient voiced understanding.)  ? ? ? ? ? ? ?Anesthesia Quick Evaluation ? ?

## 2022-01-06 NOTE — Discharge Instructions (Addendum)
Orthopedic discharge instructions: ?May shower today. ?Apply ice frequently to shoulder. ?Take ibuprofen 600 mg TID with meals for 3-5 days, then as necessary. ?Take oxycodone as prescribed when needed.  ?May supplement with ES Tylenol if necessary. ?Use sling as necessary for comfort for first 1 to 2 days, then try to discontinue using it. ?Start physical therapy tomorrow as scheduled. ?Follow-up in 10-14 days or as scheduled. ? ?AMBULATORY SURGERY  ?DISCHARGE INSTRUCTIONS ? ? ?The drugs that you were given will stay in your system until tomorrow so for the next 24 hours you should not: ? ?Drive an automobile ?Make any legal decisions ?Drink any alcoholic beverage ? ? ?You may resume regular meals tomorrow.  Today it is better to start with liquids and gradually work up to solid foods. ? ?You may eat anything you prefer, but it is better to start with liquids, then soup and crackers, and gradually work up to solid foods. ? ? ?Please notify your doctor immediately if you have any unusual bleeding, trouble breathing, redness and pain at the surgery site, drainage, fever, or pain not relieved by medication. ? ? ? ?Additional Instructions: ? ? ? ? ? ? ? ?Please contact your physician with any problems or Same Day Surgery at [phone], Monday through Friday 6 am to 4 pm, or [place] at [facility] number at [phone].  ?

## 2022-01-06 NOTE — Anesthesia Postprocedure Evaluation (Signed)
Anesthesia Post Note ? ?Patient: SHENEKA SCHROM ? ?Procedure(s) Performed: CLOSED MANIPULATION SHOULDER WITH STEROID INJECTION (Right: Shoulder) ? ?Patient location during evaluation: PACU ?Anesthesia Type: General ?Level of consciousness: awake and alert, oriented and patient cooperative ?Pain management: pain level controlled ?Vital Signs Assessment: post-procedure vital signs reviewed and stable ?Respiratory status: spontaneous breathing, nonlabored ventilation and respiratory function stable ?Cardiovascular status: blood pressure returned to baseline and stable ?Postop Assessment: adequate PO intake ?Anesthetic complications: no ? ? ?No notable events documented. ? ? ?Last Vitals:  ?Vitals:  ? 01/06/22 1015 01/06/22 1030  ?BP: (!) 142/76 (!) 144/72  ?Pulse: 81 80  ?Resp: 16 18  ?Temp:  36.6 ?C  ?SpO2: 96% 96%  ?  ?Last Pain:  ?Vitals:  ? 01/06/22 1030  ?TempSrc:   ?PainSc: 0-No pain  ? ? ?  ?  ?  ?  ?  ?  ? ?Darrin Nipper ? ? ? ? ?

## 2022-01-06 NOTE — H&P (Signed)
History of Present Illness:  ?Cheryl Wallace is a 71 y.o. female who presents for follow-up now 4.5 months status post a limited arthroscopic debridement, arthroscopic subacromial decompression, mini-open rotator cuff repair, and mini-open biceps tenodesis of the right shoulder. Overall, the patient feels that she is doing somewhat better since her last visit 6 weeks ago. However, she continues to experience moderate pain in her shoulder which she rates a 4/10 on today's visit. She has been taking Tylenol as necessary with temporary partial relief of her symptoms. She continues to attend physical therapy, as well as to perform exercises on her own at home, does feel that her range of motion and strength are slowly improving. However, she still has moderate pain at night, as well as when she wakes up in the morning. She also has pain when she attempts to raise her arm above shoulder level. She denies any reinjury to the shoulder, and denies any fevers or chills. She notes that she does have a history of a right frozen shoulder in the past. ? ?Current Outpatient Medications: ? acetaminophen (TYLENOL) 500 MG tablet Take 2 tablets (1,000 mg total) by mouth every 6 (six) hours as needed  ? amLODIPine (NORVASC) 5 MG tablet Take 5 mg by mouth once daily TAKE 1 TABLET BY MOUTH EVERY DAY  ? aspirin 81 MG chewable tablet Take 81 mg by mouth once daily.  ? atorvastatin (LIPITOR) 20 MG tablet Take 1 tablet by mouth once daily. 0  ? azelastine (ASTELIN) 137 mcg nasal spray Place 1 spray into both nostrils 2 (two) times daily 30 mL 1  ? carvediloL (COREG) 3.125 MG tablet Take 1 tablet (3.125 mg total) by mouth 2 (two) times daily  ? cholecalciferol (VITAMIN D3) 1,000 unit tablet Take by mouth.  ? diphenhydrAMINE (BENADRYL) 50 MG tablet Take 1 tablet (50 mg total) by mouth at bedtime  ? DULoxetine (CYMBALTA) 20 MG DR capsule Take 20 mg by mouth once daily  ? empagliflozin (JARDIANCE) 10 mg tablet Take 1 tablet by mouth once daily   ? ferrous fumarate-iron polysaccharide complex (TANDEM DUAL ACTION) 162-115.2 (106) mg Cap per capsule Take 1 capsule by mouth daily with breakfast  ? fexofenadine (ALLEGRA) 180 MG tablet Take 1 tablet (180 mg total) by mouth once daily  ? fluticasone propionate (FLONASE) 50 mcg/actuation nasal spray Place 1 spray into both nostrils 2 (two) times daily 16 g 0  ? glipiZIDE (GLUCOTROL) 5 MG tablet  ? metFORMIN (GLUCOPHAGE) 1000 MG tablet TAKE 1 TABLET BY MOUTH 2 TIMES DAILY WITH A MEAL.  ? omeprazole (PRILOSEC) 40 MG DR capsule Take 1 capsule by mouth once daily  ? tiZANidine (ZANAFLEX) 2 MG tablet TAKE 1 TABLET BY MOUTH EVERY 6 HOURS AS NEEDED FOR MUSCLE SPASMS.  ? valsartan (DIOVAN) 40 MG tablet Take by mouth Take 1 tablet (40 mg total) by mouth at bedtime  ? zolpidem (AMBIEN) 5 MG tablet  ? ?Allergies:  ? Lisinopril Other (Cough) ? Shellfish Containing Products Swelling of face, lips and hands, but no difficulty breathing - No problems with topical betadine  ? Penicillin Rash and other(Swelling of face, lips and hands, but no difficulty breathing)  ? ?Past Medical History:  ? Depression  ? Diabetes mellitus type 2, uncomplicated (CMS-HCC)  ? GERD (gastroesophageal reflux disease)  ? Hyperlipidemia  ? Hypertension  ? Insomnia  ? Polyarthralgia  ? ?Past Surgical History:  ? EGD 05/12/2007 (Dr. Kennis Carina @ [facility])  ? COLONOSCOPY 02/25/2020 (Normal colon/FHx CC/Repeat 73yr/TKT)  ?  Limited arthroscopic debridement, arthroscopic subacromial decompression, mini-open rotator cuff repair, and mini-open biceps tenodesis, right shoulder. Right 08/06/2021 (Dr. Roland Rack)  ? EGD 10/28/2021 (Normal EGD biopsy/Gastritis/No repeat/TKT)  ? COLONOSCOPY 05/12/2007, 12/27/2002 (Dr. Kennis Carina @ [facility] - PHPolyp(65m), FHCC(m), rpt 5 yrs per RTE)  ? elbow surgery  ? HYSTERECTOMY  ? OOPHORECTOMY  ? ?Family History:  ? Cancer Mother  ? Colon cancer Mother  ? Diabetes type II Father  ? ?Social History:  ? ?Socioeconomic History:  ? Marital status:  Divorced  ?Tobacco Use  ? Smoking status: Former  ? Smokeless tobacco: Never  ?Vaping Use  ? Vaping Use: Never used  ?Substance and Sexual Activity  ? Alcohol use: No  ?Alcohol/week: 0.0 standard drinks  ? Drug use: No  ? Sexual activity: Defer  ? ?Review of Systems:  ?A comprehensive 14 point ROS was performed, reviewed, and the pertinent orthopaedic findings are documented in the HPI. ? ?Physical Exam: ?Vitals:  ?12/25/21 0852  ?BP: 124/86  ?Weight: (!) 102.3 kg (225 lb 9.6 oz)  ?Height: 165.1 cm ('5\' 5"'$ )  ?PainSc: 4  ?PainLoc: Shoulder  ? ?General/Constitutional: Pleasant overweight middle-aged female in no acute distress. ?Neuro/Psych: Normal mood and affect, oriented to person, place and time.  ?Eyes: Non-icteric. Pupils are equal, round, and reactive to light, and exhibit synchronous movement. ?ENT: Unremarkable. ?Lymphatic: No palpable adenopathy. ?Respiratory: Lungs clear to auscultation, Normal chest excursion, No wheezes and Non-labored breathing ?Cardiovascular: Regular rate and rhythm. No murmurs. and No edema, swelling or tenderness, except as noted in detailed exam. ?Integumentary: No impressive skin lesions present, except as noted in detailed exam. ?Musculoskeletal: Unremarkable, except as noted in detailed exam. ? ?Right shoulder exam: ?On examination, her surgical incision and arthroscopic portal sites remain well-healed and without evidence for infection.  No swelling, erythema, ecchymosis, abrasions, or other skin abnormalities identified.  She describes mild-moderate tenderness to palpation diffusely over the anterior and lateral aspects of the shoulder, more so than over the posterior aspect of the shoulder. Actively, she is able to forward flex to 115 degrees, abduct to 100 degrees, and internally rotate to her right PSIS.  Passively, she is able to tolerate forward flexion to 145 degrees and abduction to 135 degrees.  At 90 degrees of abduction, she is able to tolerate external rotation to 70  degrees and internal rotation to 65 degrees. She demonstrates 3+-4/5 strength with gentle resisted internal and external rotation, and 3+/5 strength with resisted forward flexion and abduction. She notes mild-moderate pain with resisted strength testing in all directions.  She again is neurovascularly intact to the right upper extremity and hand. ? ?Assessment: ? Traumatic complete tear of right rotator cuff. ? Tendinitis of upper biceps tendon of right shoulder  ? Rotator cuff tendinitis, right  ? Adhesive capsulitis of right shoulder  ? ?Plan: ?The treatment options were discussed with the patient. In addition, patient educational materials were provided regarding the diagnosis and treatment options. The patient is frustrated by her persistent symptoms and functional limitations and is ready to consider more aggressive treatment options. Based on her examination findings, I feel that she is developing these of capsulitis in her shoulder. Therefore, I have recommended that we proceed with a manipulation under anesthesia with steroid injection of the right shoulder. The procedure was discussed with the patient, as were the potential risks (including bleeding, infection, nerve and/or blood vessel injury, persistent or recurrent pain/stiffness, humerus fracture, need for further surgery, blood clots, strokes, heart attacks and/or arhythmias, pneumonia, etc.)  and benefits. The patient states her understanding and wishes to proceed. All of the patient's questions and concerns were answered. She can call any time with further concerns. She will follow up post-surgery, routine. ? ? ?H&P reviewed and patient re-examined. No changes. ?

## 2022-01-07 ENCOUNTER — Encounter: Payer: Self-pay | Admitting: Surgery

## 2022-01-07 DIAGNOSIS — M6281 Muscle weakness (generalized): Secondary | ICD-10-CM | POA: Diagnosis not present

## 2022-01-07 DIAGNOSIS — G8929 Other chronic pain: Secondary | ICD-10-CM | POA: Diagnosis not present

## 2022-01-07 DIAGNOSIS — M25511 Pain in right shoulder: Secondary | ICD-10-CM | POA: Diagnosis not present

## 2022-01-07 DIAGNOSIS — M25611 Stiffness of right shoulder, not elsewhere classified: Secondary | ICD-10-CM | POA: Diagnosis not present

## 2022-01-08 DIAGNOSIS — G8929 Other chronic pain: Secondary | ICD-10-CM | POA: Diagnosis not present

## 2022-01-08 DIAGNOSIS — M25511 Pain in right shoulder: Secondary | ICD-10-CM | POA: Diagnosis not present

## 2022-01-12 DIAGNOSIS — G8929 Other chronic pain: Secondary | ICD-10-CM | POA: Diagnosis not present

## 2022-01-12 DIAGNOSIS — M25511 Pain in right shoulder: Secondary | ICD-10-CM | POA: Diagnosis not present

## 2022-01-15 DIAGNOSIS — M25511 Pain in right shoulder: Secondary | ICD-10-CM | POA: Diagnosis not present

## 2022-01-19 DIAGNOSIS — G8929 Other chronic pain: Secondary | ICD-10-CM | POA: Diagnosis not present

## 2022-01-19 DIAGNOSIS — M25511 Pain in right shoulder: Secondary | ICD-10-CM | POA: Diagnosis not present

## 2022-02-01 ENCOUNTER — Other Ambulatory Visit: Payer: Self-pay | Admitting: Internal Medicine

## 2022-02-02 NOTE — Telephone Encounter (Signed)
Refilled: 08/02/2021 Last OV: 11/20/2021 Next OV: 05/03/2022

## 2022-02-05 DIAGNOSIS — M25511 Pain in right shoulder: Secondary | ICD-10-CM | POA: Diagnosis not present

## 2022-02-05 DIAGNOSIS — G8929 Other chronic pain: Secondary | ICD-10-CM | POA: Diagnosis not present

## 2022-02-06 IMAGING — US US RENAL
1 series · 15 of 23 positions shown · non-contrast
Comparison: CT abdomen pelvis 05/11/2013

CLINICAL DATA: Stage III A chronic kidney disease.

EXAM:
RENAL / URINARY TRACT ULTRASOUND COMPLETE

[Series 1: us renal · 15 of 23 slices shown]
[im 1/23]
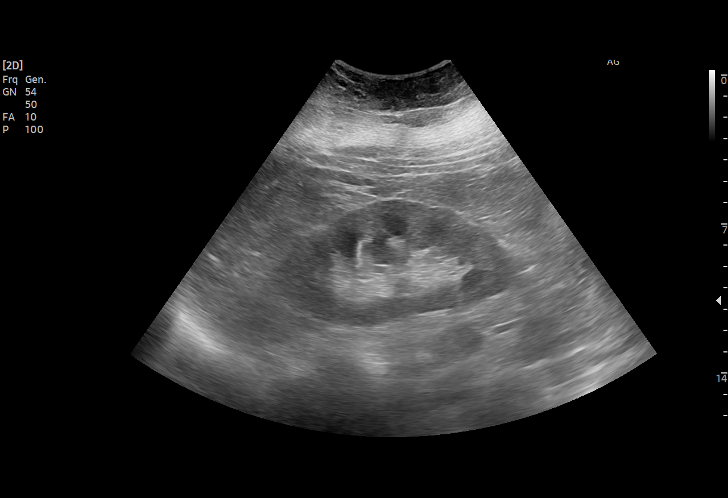
[im 3/23]
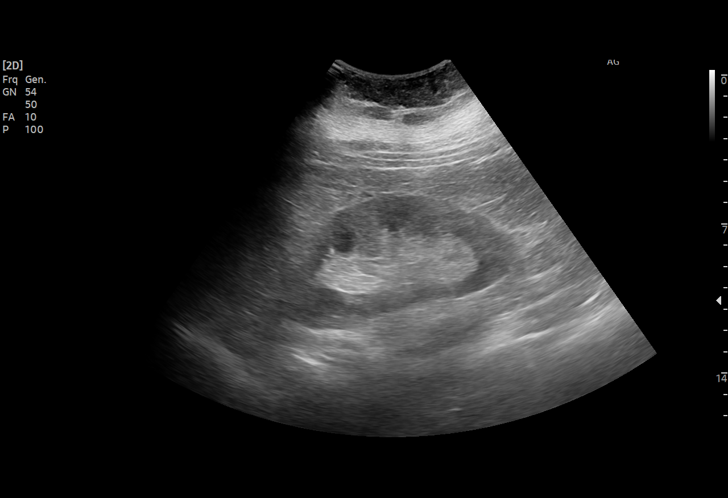
[im 4/23]
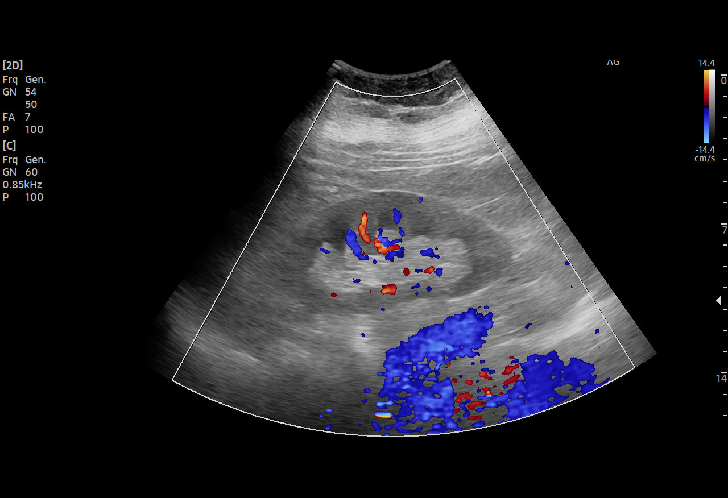
[im 6/23]
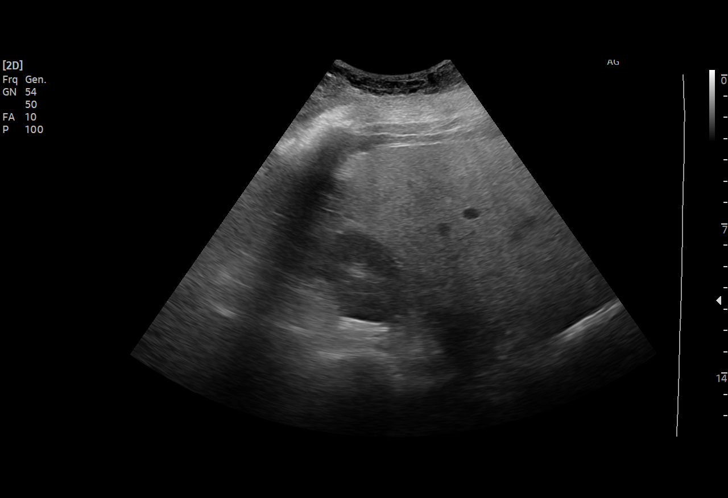
[im 7/23]
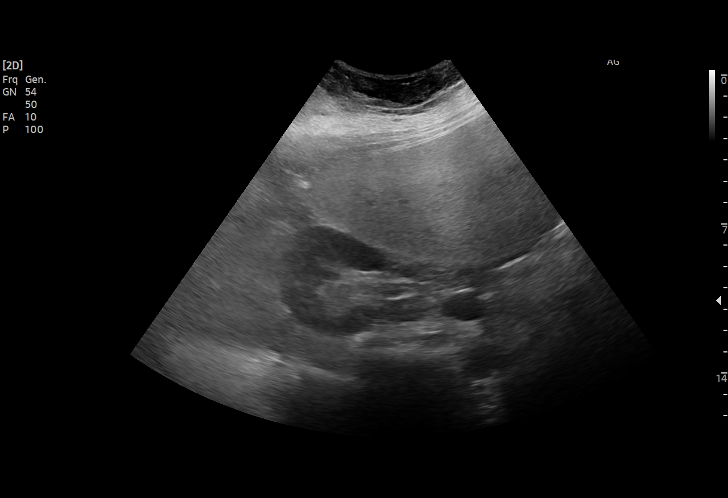
[im 9/23]
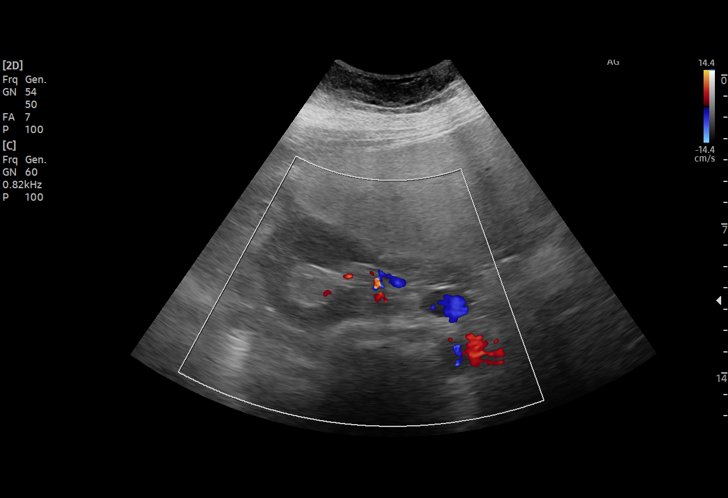
[im 10/23]
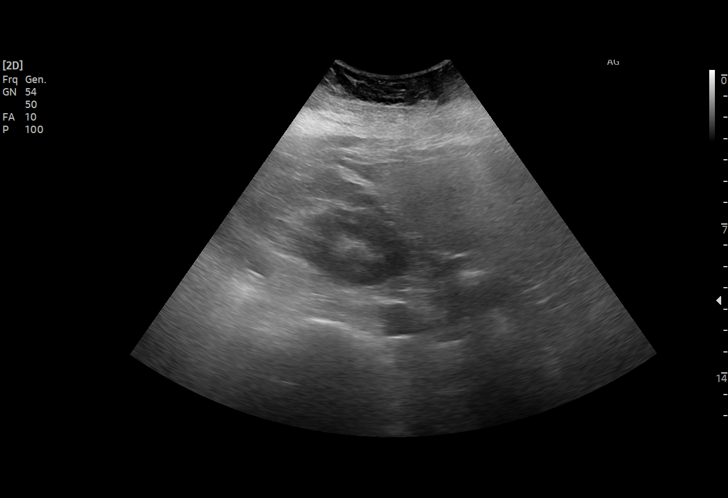
[im 12/23]
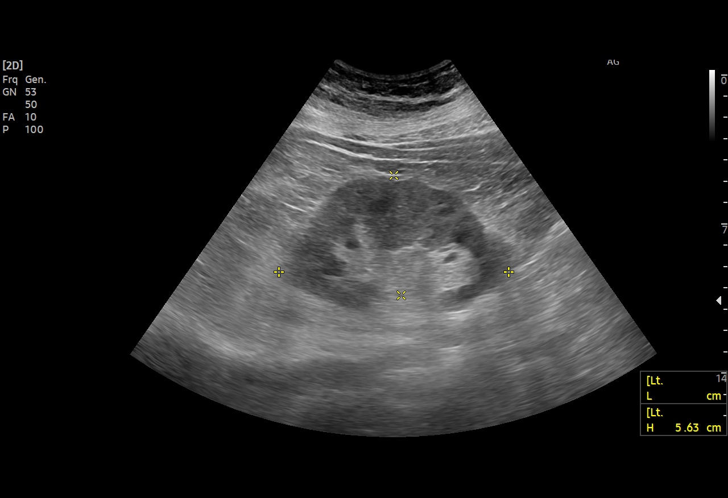
[im 14/23]
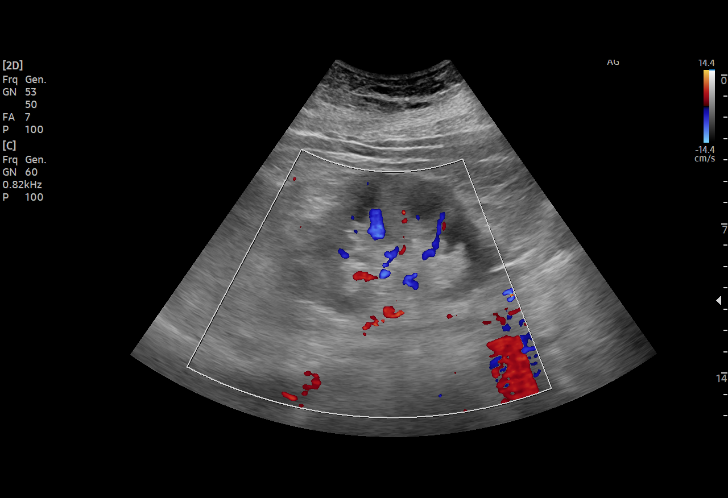
[im 15/23]
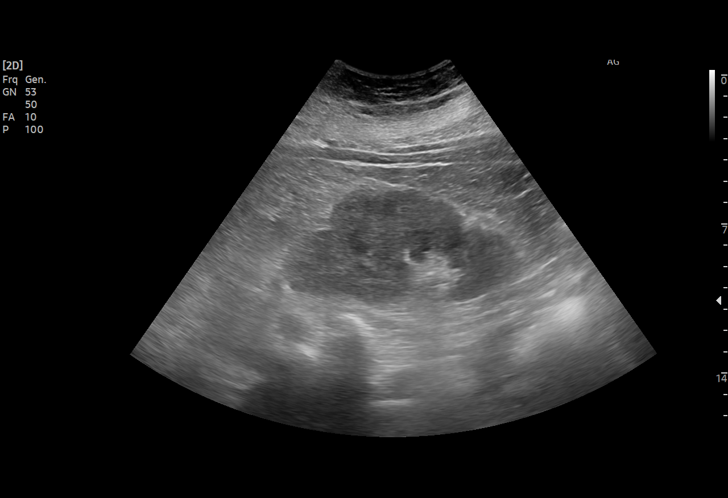
[im 17/23]
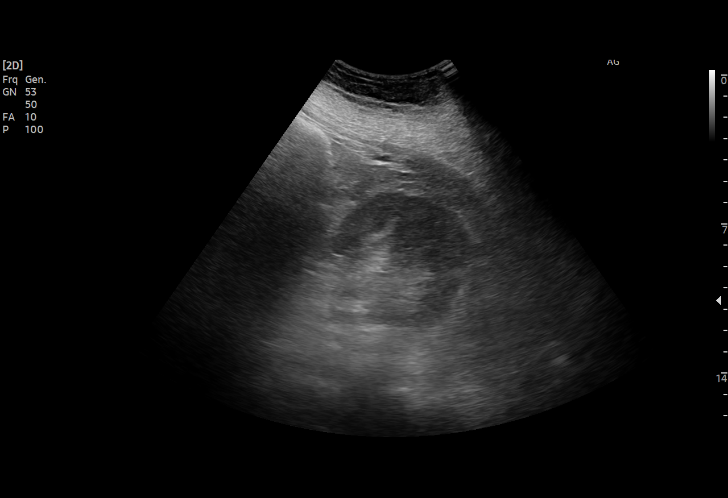
[im 18/23]
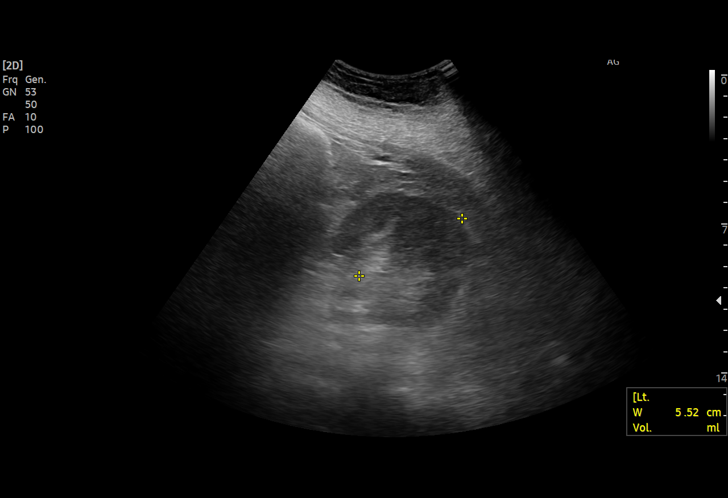
[im 20/23]
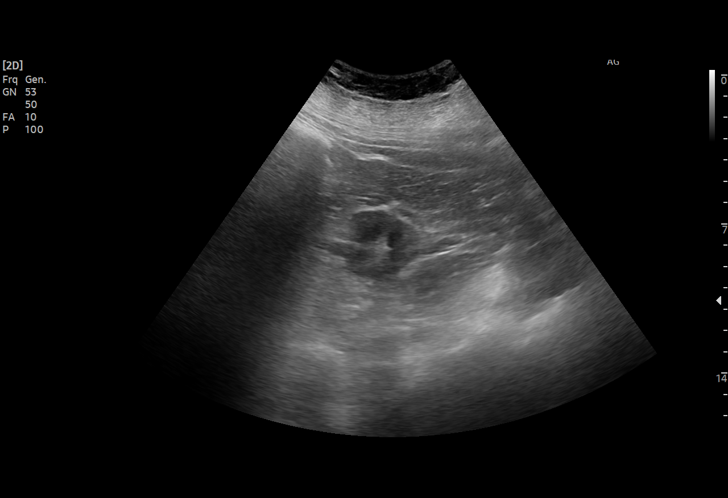
[im 21/23]
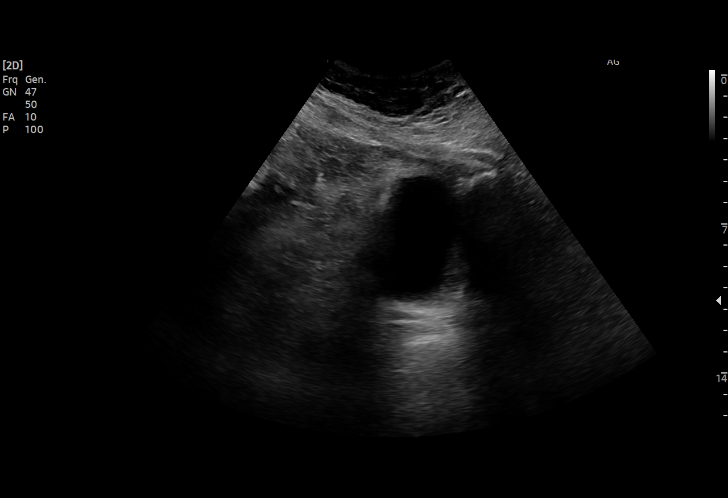
[im 23/23]
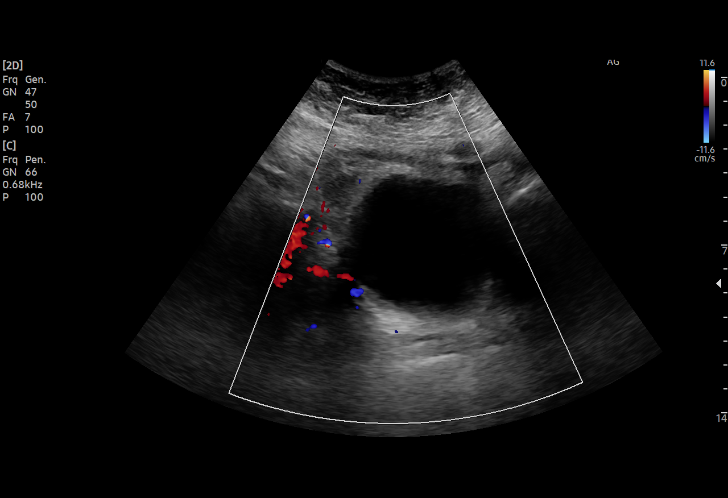

[15 of 23 positions shown; findings below may reference images not displayed]

FINDINGS: Right Kidney:

Renal measurements: 11.4 x 5.7 x 4.5 cm = volume: 152 mL.
Echogenicity within normal limits. No mass or hydronephrosis
visualized.

Left Kidney:

Renal measurements: 10.8 x 5.6 x 5.5 cm = volume: 175 mL.
Echogenicity within normal limits. No mass or hydronephrosis
visualized.

Bladder:

Appears normal for degree of bladder distention.

Other:

None
IMPRESSION: Negative renal ultrasound

## 2022-02-08 DIAGNOSIS — G8929 Other chronic pain: Secondary | ICD-10-CM | POA: Diagnosis not present

## 2022-02-08 DIAGNOSIS — M25511 Pain in right shoulder: Secondary | ICD-10-CM | POA: Diagnosis not present

## 2022-02-12 DIAGNOSIS — M25511 Pain in right shoulder: Secondary | ICD-10-CM | POA: Diagnosis not present

## 2022-02-12 DIAGNOSIS — G8929 Other chronic pain: Secondary | ICD-10-CM | POA: Diagnosis not present

## 2022-02-16 DIAGNOSIS — M25511 Pain in right shoulder: Secondary | ICD-10-CM | POA: Diagnosis not present

## 2022-02-16 DIAGNOSIS — G8929 Other chronic pain: Secondary | ICD-10-CM | POA: Diagnosis not present

## 2022-02-19 DIAGNOSIS — G8929 Other chronic pain: Secondary | ICD-10-CM | POA: Diagnosis not present

## 2022-02-19 DIAGNOSIS — M25511 Pain in right shoulder: Secondary | ICD-10-CM | POA: Diagnosis not present

## 2022-02-22 ENCOUNTER — Ambulatory Visit: Payer: BC Managed Care – PPO | Admitting: Internal Medicine

## 2022-03-16 DIAGNOSIS — R058 Other specified cough: Secondary | ICD-10-CM | POA: Diagnosis not present

## 2022-03-16 DIAGNOSIS — K219 Gastro-esophageal reflux disease without esophagitis: Secondary | ICD-10-CM | POA: Diagnosis not present

## 2022-03-16 DIAGNOSIS — D509 Iron deficiency anemia, unspecified: Secondary | ICD-10-CM | POA: Diagnosis not present

## 2022-03-20 ENCOUNTER — Other Ambulatory Visit: Payer: Self-pay | Admitting: Internal Medicine

## 2022-03-25 ENCOUNTER — Ambulatory Visit: Payer: BC Managed Care – PPO | Admitting: Internal Medicine

## 2022-04-06 ENCOUNTER — Inpatient Hospital Stay: Payer: Medicare HMO

## 2022-04-06 ENCOUNTER — Inpatient Hospital Stay: Payer: Medicare HMO | Admitting: Oncology

## 2022-04-09 ENCOUNTER — Other Ambulatory Visit: Payer: Self-pay

## 2022-04-09 ENCOUNTER — Encounter: Payer: Self-pay | Admitting: Emergency Medicine

## 2022-04-09 ENCOUNTER — Emergency Department: Payer: Medicare HMO

## 2022-04-09 ENCOUNTER — Emergency Department
Admission: EM | Admit: 2022-04-09 | Discharge: 2022-04-09 | Disposition: A | Discharge status: Home / Self Care | Payer: Medicare HMO | Attending: Emergency Medicine | Admitting: Emergency Medicine

## 2022-04-09 DIAGNOSIS — K760 Fatty (change of) liver, not elsewhere classified: Secondary | ICD-10-CM | POA: Diagnosis not present

## 2022-04-09 DIAGNOSIS — N3090 Cystitis, unspecified without hematuria: Secondary | ICD-10-CM | POA: Diagnosis not present

## 2022-04-09 DIAGNOSIS — R1011 Right upper quadrant pain: Secondary | ICD-10-CM | POA: Diagnosis not present

## 2022-04-09 DIAGNOSIS — E119 Type 2 diabetes mellitus without complications: Secondary | ICD-10-CM | POA: Insufficient documentation

## 2022-04-09 DIAGNOSIS — K8050 Calculus of bile duct without cholangitis or cholecystitis without obstruction: Secondary | ICD-10-CM | POA: Diagnosis not present

## 2022-04-09 LAB — COMPREHENSIVE METABOLIC PANEL
ALT: 16 U/L (ref 0–44)
AST: 18 U/L (ref 15–41)
Albumin: 4 g/dL (ref 3.5–5.0)
Alkaline Phosphatase: 87 U/L (ref 38–126)
Anion gap: 7 (ref 5–15)
BUN: 11 mg/dL (ref 8–23)
CO2: 27 mmol/L (ref 22–32)
Calcium: 9.3 mg/dL (ref 8.9–10.3)
Chloride: 108 mmol/L (ref 98–111)
Creatinine, Ser: 0.8 mg/dL (ref 0.44–1.00)
GFR, Estimated: >60 mL/min (ref >60)
Glucose, Bld: 186 mg/dL — ABNORMAL HIGH (ref 70–99)
Potassium: 3.7 mmol/L (ref 3.5–5.1)
Sodium: 142 mmol/L (ref 135–145)
Total Bilirubin: 0.6 mg/dL (ref 0.3–1.2)
Total Protein: 7.1 g/dL (ref 6.5–8.1)

## 2022-04-09 LAB — CBC
HCT: 44.7 % (ref 36.0–46.0)
Hemoglobin: 13.7 g/dL (ref 12.0–15.0)
MCH: 27.3 pg (ref 26.0–34.0)
MCHC: 30.6 g/dL (ref 30.0–36.0)
MCV: 89 fL (ref 80.0–100.0)
Platelets: 295 10*3/uL (ref 150–400)
RBC: 5.02 MIL/uL (ref 3.87–5.11)
RDW: 14 % (ref 11.5–15.5)
WBC: 7.1 10*3/uL (ref 4.0–10.5)
nRBC: 0 % (ref 0.0–0.2)

## 2022-04-09 LAB — URINALYSIS, ROUTINE W REFLEX MICROSCOPIC
Bilirubin Urine: NEGATIVE
Glucose, UA: >=500 mg/dL — AB
Hgb urine dipstick: NEGATIVE
Ketones, ur: NEGATIVE mg/dL
Leukocytes,Ua: NEGATIVE
Nitrite: NEGATIVE
Protein, ur: NEGATIVE mg/dL
Specific Gravity, Urine: 1.017 (ref 1.005–1.030)
pH: 6 (ref 5.0–8.0)

## 2022-04-09 LAB — LIPASE, BLOOD: Lipase: 35 U/L (ref 11–51)

## 2022-04-09 MED ORDER — CEPHALEXIN 500 MG PO CAPS: 500.0000 mg | ORAL_CAPSULE | Freq: Three times a day (TID) | ORAL | 0 refills | Status: AC

## 2022-04-09 MED ORDER — ONDANSETRON 4 MG PO TBDP: 4.0000 mg | ORAL_TABLET | Freq: Three times a day (TID) | ORAL | 0 refills | Status: DC | PRN

## 2022-04-09 NOTE — ED Notes (Signed)
First Nurse Note: Patient sent to ED from [facility] for RUQ abd pain. Patient states she has had diarrhea x3 weeks. Denies N/V.

## 2022-04-09 NOTE — ED Triage Notes (Signed)
Patient c/o RUQ abdominal pain, diarrhea, flatulence, nausea over the past 3 weeks. Reports hx of gallstones. Pain worse after eating.

## 2022-04-09 NOTE — ED Notes (Signed)
This RN first encounter with pt prior to discharge pt verbalized understanding of discharge instructions, prescriptions, and follow-up care instructions. Pt advised if symptoms worsen to return to ED. E-signature not available due to signature pad not working.

## 2022-04-09 NOTE — ED Provider Notes (Signed)
[facility] Provider Note    Event Date/Time   First MD Initiated Contact with Patient 04/09/22 1418     (approximate)   History   Chief Complaint: Abdominal Pain   HPI  Cheryl Wallace is a 71 y.o. female with a history of cholelithiasis, hyperlipidemia, diabetes, obesity who comes ED complaining of right upper quadrant pain with nausea for the past 3 weeks, worse after eating.  Nonradiating.  No fever.  No chest pain or shortness of breath.     Physical Exam   Triage Vital Signs: ED Triage Vitals  Enc Vitals Group     BP 04/09/22 1245 (!) 157/95     Pulse Rate 04/09/22 1245 91     Resp 04/09/22 1245 18     Temp 04/09/22 1245 98.4 F (36.9 C)     Temp Source 04/09/22 1245 Oral     SpO2 04/09/22 1245 95 %     Weight 04/09/22 1246 223 lb (101.2 kg)     Height 04/09/22 1246 '5\' 5"'$  (1.651 m)     Head Circumference --      Peak Flow --      Pain Score 04/09/22 1246 4     Pain Loc --      Pain Edu? --      Excl. in [place]? --     Most recent vital signs: Vitals:   04/09/22 1245 04/09/22 1537  BP: (!) 157/95 (!) 155/74  Pulse: 91 82  Resp: 18 16  Temp: 98.4 F (36.9 C) 98.3 F (36.8 C)  SpO2: 95% 93%    General: Awake, no distress.  CV:  Good peripheral perfusion.  Regular rate and rhythm Resp:  Normal effort.  Clear to auscultation bilateral Abd:  No distention.  Soft with right upper quadrant tenderness.  No rebound rigidity or guarding.  There is also some nonfocal lower abdominal tenderness. Other:  No edema.  Moist oral mucosa.  Nontoxic appearance   ED Results / Procedures / Treatments   Labs (all labs ordered are listed, but only abnormal results are displayed) Labs Reviewed  COMPREHENSIVE METABOLIC PANEL - Abnormal; Notable for the following components:      Result Value   Glucose, Bld 186 (*)    All other components within normal limits  URINALYSIS, ROUTINE W REFLEX MICROSCOPIC - Abnormal; Notable for the following  components:   Color, Urine YELLOW (*)    APPearance CLEAR (*)    Glucose, UA >=500 (*)    Bacteria, UA MANY (*)    All other components within normal limits  LIPASE, BLOOD  CBC     EKG    RADIOLOGY Ultrasound right upper quadrant interpreted by me, shows cholelithiasis without signs of cholecystitis.  Radiology report reviewed.   PROCEDURES:  Procedures   MEDICATIONS ORDERED IN ED: Medications - No data to display   IMPRESSION / MDM / [place] / ED COURSE  I reviewed the triage vital signs and the nursing notes.                              Differential diagnosis includes, but is not limited to, cholecystitis, choledocholithiasis, pancreatitis, cystitis, gastritis  Patient's presentation is most consistent with acute presentation with potential threat to life or bodily function.  Patient presents with postprandial right upper quadrant abdominal pain.  History of gallstones.  Vital signs are unremarkable, she is nontoxic.  Not septic.  Exam worrisome for biliary disease primarily.  Work-up shows normal serum labs, urinalysis suggestive of a UTI, no blood to suggest a concurrent kidney stone.  Ultrasound shows cholelithiasis without cholecystitis.  I suspect her symptoms are due to biliary colic, and incidentally find evidence of developing cystitis.  Will treat with Keflex, Zofran, follow-up with general surgery in office.       FINAL CLINICAL IMPRESSION(S) / ED DIAGNOSES   Final diagnoses:  Cystitis  Biliary colic     Rx / DC Orders   ED Discharge Orders          Ordered    cephALEXin (KEFLEX) 500 MG capsule  3 times daily        04/09/22 1540    ondansetron (ZOFRAN-ODT) 4 MG disintegrating tablet  Every 8 hours PRN        04/09/22 1540             Note:  This document was prepared using Dragon voice recognition software and may include unintentional dictation errors.   Carrie Mew, MD 04/09/22 [phone]

## 2022-04-12 ENCOUNTER — Inpatient Hospital Stay (HOSPITAL_BASED_OUTPATIENT_CLINIC_OR_DEPARTMENT_OTHER): Payer: Medicare HMO | Admitting: Medical Oncology

## 2022-04-12 ENCOUNTER — Encounter: Payer: Self-pay | Admitting: Medical Oncology

## 2022-04-12 ENCOUNTER — Inpatient Hospital Stay: Payer: Medicare HMO | Attending: Oncology

## 2022-04-12 VITALS — BP 147/76 | HR 91 | Temp 98.1°F | Resp 18 | Wt 227.0 lb

## 2022-04-12 DIAGNOSIS — D509 Iron deficiency anemia, unspecified: Secondary | ICD-10-CM | POA: Insufficient documentation

## 2022-04-12 DIAGNOSIS — E1122 Type 2 diabetes mellitus with diabetic chronic kidney disease: Secondary | ICD-10-CM | POA: Insufficient documentation

## 2022-04-12 DIAGNOSIS — Z79899 Other long term (current) drug therapy: Secondary | ICD-10-CM | POA: Diagnosis not present

## 2022-04-12 DIAGNOSIS — Z7984 Long term (current) use of oral hypoglycemic drugs: Secondary | ICD-10-CM | POA: Diagnosis not present

## 2022-04-12 DIAGNOSIS — N189 Chronic kidney disease, unspecified: Secondary | ICD-10-CM | POA: Diagnosis not present

## 2022-04-12 DIAGNOSIS — I129 Hypertensive chronic kidney disease with stage 1 through stage 4 chronic kidney disease, or unspecified chronic kidney disease: Secondary | ICD-10-CM | POA: Insufficient documentation

## 2022-04-12 DIAGNOSIS — D508 Other iron deficiency anemias: Secondary | ICD-10-CM

## 2022-04-12 LAB — CBC WITH DIFFERENTIAL/PLATELET
Abs Immature Granulocytes: 0.02 10*3/uL (ref 0.00–0.07)
Basophils Absolute: 0 10*3/uL (ref 0.0–0.1)
Basophils Relative: 1 %
Eosinophils Absolute: 0.3 10*3/uL (ref 0.0–0.5)
Eosinophils Relative: 6 %
HCT: 42.7 % (ref 36.0–46.0)
Hemoglobin: 13.4 g/dL (ref 12.0–15.0)
Immature Granulocytes: 0 %
Lymphocytes Relative: 36 %
Lymphs Abs: 2.2 10*3/uL (ref 0.7–4.0)
MCH: 28 pg (ref 26.0–34.0)
MCHC: 31.4 g/dL (ref 30.0–36.0)
MCV: 89.3 fL (ref 80.0–100.0)
Monocytes Absolute: 0.5 10*3/uL (ref 0.1–1.0)
Monocytes Relative: 8 %
Neutro Abs: 3 10*3/uL (ref 1.7–7.7)
Neutrophils Relative %: 49 %
Platelets: 278 10*3/uL (ref 150–400)
RBC: 4.78 MIL/uL (ref 3.87–5.11)
RDW: 13.8 % (ref 11.5–15.5)
WBC: 6 10*3/uL (ref 4.0–10.5)
nRBC: 0 % (ref 0.0–0.2)

## 2022-04-12 LAB — IRON AND TIBC
Iron: 60 ug/dL (ref 28–170)
Saturation Ratios: 15 % (ref 10.4–31.8)
TIBC: 407 ug/dL (ref 250–450)
UIBC: 347 ug/dL

## 2022-04-12 LAB — FERRITIN: Ferritin: 26 ng/mL (ref 11–307)

## 2022-04-12 NOTE — Progress Notes (Signed)
Pt went to ER over the weekend for cystitis and biliary colic and currently on cephalexin for five days.

## 2022-04-12 NOTE — Progress Notes (Signed)
Hematology/Oncology Consult note [facility]  [phone] [phone]  Patient Care Team: Crecencio Mc, MD as PCP - General (Internal Medicine) [name], MD (General Surgery)   Name of the patient: Cheryl Wallace  [id number]  04-20-1951   Date of visit: 04/12/22  Diagnosis-iron deficiency anemia  Chief complaint/ Reason for visit-routine follow-up of iron deficiency anemia  Heme/Onc history: patient is a 71 year old female with a past medical history significant for hypertension type 2 diabetes CKD and she has been referred to [place] for anemia.  Her most recent CBC from 10/3/2022Showed white count of 10, H&H of 10.3/34.1 with an MCV of 73.7 and a platelet count of 331.  Her prior CBC from September 2022 showed an H&H of 9.8/23.9.  Patient has had chronic microcytosis with evidence of iron deficiency in the past.  No recent iron studies checked.  Patient denies any blood loss in her stool or urine.  Denies any dark melanotic stools.  Family history positive for colon cancer in her mother in her 9s.  She has had a colonoscopy in June 2021 by Dr. Alice Reichert which showed nonbleeding internal hemorrhoids but otherwise normal.  She has a history of GERD and has had an EGD many years ago.  Denies any changes in her appetite or weight.  Denies any difficulty swallowing.  Denies any consistent use of NSAIDs.  She is postmenopausal   Patient is currently receiving Venofer.  Results of blood work from 06/24/2021 were as follows: CBC showed an H&H of 10.4/35.6 with an MCV of 76 and a platelet count of 306.  Ferritin levels were low at 7.  Iron saturation low at 7% with an elevated TIBC of 568.  B12 folate TSH normal.  Haptoglobin normal.  Myeloma panel showed no M protein.    Interval history- Patient was recently in the ER for acute cystitis for which she is on Keflex. Found to have gallstones as well and was recommended to see a general surgeon. Doing  better now- scheduling her general surgery visit after her appointment today. In terms of her IDA she reports that she is doing well. Not currently taking any iron supplementation. Not following an iron rich diet and bough cast iron pans but has not started using them yet. Used iron once previously and took on an empty stomach- had some subsequent nausea. No excessive fatigue, SOB, peripheral edema.   ECOG PS- 1 Pain scale- 0   Review of systems- Review of Systems  Constitutional:  Negative for chills, fever, malaise/fatigue and weight loss.  HENT:  Negative for congestion, ear discharge and nosebleeds.   Eyes:  Negative for blurred vision.  Respiratory:  Negative for cough, hemoptysis, sputum production, shortness of breath and wheezing.   Cardiovascular:  Negative for chest pain, palpitations, orthopnea and claudication.  Gastrointestinal:  Negative for abdominal pain, blood in stool, constipation, diarrhea, heartburn, melena, nausea and vomiting.  Genitourinary:  Negative for dysuria, flank pain, frequency, hematuria and urgency.  Musculoskeletal:  Negative for back pain, joint pain and myalgias.  Skin:  Negative for rash.  Neurological:  Negative for dizziness, tingling, focal weakness, seizures, weakness and headaches.  Endo/Heme/Allergies:  Does not bruise/bleed easily.  Psychiatric/Behavioral:  Negative for depression and suicidal ideas. The patient does not have insomnia.       Allergies  Allergen Reactions   Shellfish Allergy Swelling    Swelling of face, lips and hands. No difficulty breathing. No problems with topical betadine  Lisinopril Cough    cough   Ozempic (0.25 Or 0.5 Mg-Dose) [Semaglutide(0.25 Or 0.'5mg'$ -Dos)] Nausea And Vomiting   Hctz [Hydrochlorothiazide]     Muscle cramps   Penicillins Rash     Past Medical History:  Diagnosis Date   Acute meniscal tear, medial, right, sequela 09/22/2020   Allergic rhinitis    Anemia    iron deficiency   Arthritis     osteoarthritis right knee   Asthma    Chronic kidney disease    nephropathy d/t diabetes   Complication of anesthesia 1981   While pt was under anesthesia, pt had an asthema attack during BTL due to having the flu   Depression, major, single episode, mild ([place]) 09/04/2016   Elbow tendonitis    a. bilat, s/p surgery.   Essential hypertension    Functional ovarian cysts 2011   a. s/p TAH (1994);  b. s/p oopherectomy (~2011)   Gallstones    GERD (gastroesophageal reflux disease)    History of kidney stones    Hyperlipidemia    Insomnia    Low grade fever    Obesity    Pneumonia    Polyarthralgia    Seasonal allergies    Seizure ([place])    last episode at 14 years. nothing since. unknown etiology   Tinnitus    Type 2 diabetes mellitus ([place])    a.  Hemoglobin A1c in July 2016, 6.7.     Past Surgical History:  Procedure Laterality Date   ABDOMINAL HYSTERECTOMY  1994   CLOSED MANIPULATION SHOULDER WITH STERIOD INJECTION Right 01/06/2022   Procedure: CLOSED MANIPULATION SHOULDER WITH STEROID INJECTION;  Surgeon: Corky Mull, MD;  Location: [facility];  Service: Orthopedics;  Laterality: Right;   COLONOSCOPY  05/2007   Non-bleeding internal hemorhoids identified.   COLONOSCOPY WITH PROPOFOL N/A 02/25/2020   Procedure: COLONOSCOPY WITH PROPOFOL;  Surgeon: Toledo, Benay Pike, MD;  Location: [facility];  Service: Gastroenterology;  Laterality: N/A;   ELBOW SURGERY Bilateral    d/t tendonitis   ESOPHAGOGASTRODUODENOSCOPY (EGD) WITH PROPOFOL N/A 10/28/2021   Procedure: ESOPHAGOGASTRODUODENOSCOPY (EGD) WITH PROPOFOL;  Surgeon: Toledo, Benay Pike, MD;  Location: [facility];  Service: Gastroenterology;  Laterality: N/A;  DM   FRACTURE SURGERY Right    wrist/hand with plate   KNEE ARTHROSCOPY Right 11/18/2020   Procedure: Right knee arthroscopy, partial medial meniscectomy;  Surgeon: Hessie Knows, MD;  Location: [facility];  Service: Orthopedics;  Laterality: Right;   NASAL SINUS  SURGERY  09/18/2015   Procedure: IMAGE GUIDED SINUS SURGERY, BILATERAL MAXILLARY BALLOON SINUPLASTY, BILATERAL FRONTAL BALLOON SINUPLASTY, RIGHT SPHENOID  SINUPLASTY, RIGHT CONCHABULLOSA RESECTION;  Surgeon: Carloyn Manner, MD;  Location: [facility];  Service: ENT;;   OOPHORECTOMY  04/2010   bilateral, 7 cm benign tumor left ovary   OPEN REDUCTION INTERNAL FIXATION (ORIF) DISTAL RADIAL FRACTURE Right 02/07/2017   Procedure: OPEN REDUCTION INTERNAL FIXATION (ORIF) DISTAL RADIAL FRACTURE;  Surgeon: Hessie Knows, MD;  Location: [facility];  Service: Orthopedics;  Laterality: Right;   PARTIAL HYSTERECTOMY  1994   still has ovaries, removed for fibroids   SHOULDER ARTHROSCOPY WITH SUBACROMIAL DECOMPRESSION, ROTATOR CUFF REPAIR AND BICEP TENDON REPAIR Right 08/06/2021   Procedure: RIGHT SHOULDER ARTHROSCOPY WITH DEBRIDEMENT, DECOMPRESSION, AND POSSIBLE ROTATOR CUFF REPAIR;  Surgeon: Corky Mull, MD;  Location: [facility];  Service: Orthopedics;  Laterality: Right;   TONSILLECTOMY N/A 09/18/2015   Procedure: TONSILLECTOMY;  Surgeon: Carloyn Manner, MD;  Location: [facility];  Service: ENT;  Laterality: N/A;  TONSILLECTOMY     TUBAL LIGATION     UPPER GI ENDOSCOPY  2011    Social History   Socioeconomic History   Marital status: Divorced    Spouse name: Not on file   Number of children: Not on file   Years of education: Not on file   Highest education level: Not on file  Occupational History   Occupation: school and bus driver    Comment: still working  Tobacco Use   Smoking status: Former    Packs/day: 1.50    Years: 10.00    Total pack years: 15.00    Types: Cigarettes    Quit date: 09/05/1984    Years since quitting: 37.6   Smokeless tobacco: Never   Tobacco comments:    remote, quit 30 years ago  Vaping Use   Vaping Use: Never used  Substance and Sexual Activity   Alcohol use: Not Currently    Alcohol/week: 0.0 standard drinks of alcohol    Comment: on occasion, martinis maybe  twice a year (Christmas & New Years).   Drug use: No   Sexual activity: Not Currently  Other Topics Concern   Not on file  Social History Narrative   Patient lives alone. Currently working. Daughter lives somewhat nearby.    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     Does not routinely exercise.  Always uses seat belts.  Has well water.   Social Determinants of Health   Financial Resource Strain: Not on file  Food Insecurity: Not on file  Transportation Needs: Not on file  Physical Activity: Not on file  Stress: Not on file  Social Connections: Not on file  Intimate Partner Violence: Not on file    Family History  Problem Relation Age of Onset   Cancer Mother        colon   Tuberculosis Father    Diabetes Father    Heart disease Father        MI x 2   Heart attack Father    Diabetes Sister        half sister     Current Outpatient Medications:    acetaminophen (TYLENOL) 500 MG tablet, Take 1,000 mg by mouth every 6 (six) hours as needed for mild pain or moderate pain., Disp: , Rfl:    amLODipine (NORVASC) 10 MG tablet, TAKE 1 TABLET BY MOUTH EVERYDAY AT BEDTIME, Disp: 90 tablet, Rfl: 1   aspirin 81 MG chewable tablet, Chew 81 mg by mouth in the morning., Disp: , Rfl:    atorvastatin (LIPITOR) 20 MG tablet, Take 1 tablet (20 mg total) by mouth at bedtime., Disp: , Rfl:    carvedilol (COREG) 3.125 MG tablet, TAKE 1 TABLET (3.125 MG TOTAL) BY MOUTH 2 (TWO) TIMES DAILY WITH A MEAL., Disp: 180 tablet, Rfl: 1   cephALEXin (KEFLEX) 500 MG capsule, Take 1 capsule (500 mg total) by mouth 3 (three) times daily for 5 days., Disp: 15 capsule, Rfl: 0   cholecalciferol (VITAMIN D) 1000 UNITS tablet, Take 1,000 Units by mouth daily. , Disp: , Rfl:    diphenhydrAMINE (BENADRYL) 50 MG tablet, Take 50 mg by mouth at bedtime., Disp: , Rfl:    DULoxetine HCl 40 MG CPEP, Take 40 mg by mouth at bedtime., Disp: , Rfl:    fexofenadine (ALLEGRA) 180 MG tablet, Take 180 mg by mouth every  morning., Disp: , Rfl:    glipiZIDE (GLUCOTROL) 10 MG tablet, TAKE 1 TABLET (10 MG TOTAL)  BY MOUTH 2 (TWO) TIMES DAILY BEFORE A MEAL., Disp: 180 tablet, Rfl: 1   JARDIANCE 10 MG TABS tablet, TAKE 1 TABLET BY MOUTH EVERY DAY, Disp: 90 tablet, Rfl: 1   metFORMIN (GLUCOPHAGE) 1000 MG tablet, TAKE 1 TABLET BY MOUTH 2 TIMES DAILY WITH A MEAL., Disp: 180 tablet, Rfl: 1   omeprazole (PRILOSEC) 40 MG capsule, Take 40 mg by mouth daily before breakfast., Disp: , Rfl:    ondansetron (ZOFRAN-ODT) 4 MG disintegrating tablet, Take 1 tablet (4 mg total) by mouth every 8 (eight) hours as needed for nausea or vomiting., Disp: 20 tablet, Rfl: 0   tiZANidine (ZANAFLEX) 2 MG tablet, TAKE 1 TABLET BY MOUTH EVERY 6 HOURS AS NEEDED FOR MUSCLE SPASMS., Disp: 360 tablet, Rfl: 1   valsartan (DIOVAN) 40 MG tablet, TAKE 1 TABLET BY MOUTH EVERYDAY AT BEDTIME, Disp: 90 tablet, Rfl: 1   zolpidem (AMBIEN) 5 MG tablet, TAKE 1 TABLET BY MOUTH EVERY DAY AT BEDTIME AS NEEDED FOR SLEEP, Disp: 30 tablet, Rfl: 2  Physical exam:  Vitals:   04/12/22 1008  BP: (!) 147/76  Pulse: 91  Resp: 18  Temp: 98.1 F (36.7 C)  SpO2: 96%  Weight: 227 lb (103 kg)   Physical Exam Constitutional:      General: She is not in acute distress. Cardiovascular:     Rate and Rhythm: Normal rate and regular rhythm.     Heart sounds: Normal heart sounds.  Pulmonary:     Effort: Pulmonary effort is normal.     Breath sounds: Normal breath sounds.  Skin:    General: Skin is warm and dry.     Coloration: Skin is not pale.  Neurological:     Mental Status: She is alert and oriented to person, place, and time.         Latest Ref Rng & Units 04/09/2022   12:53 PM  CMP  Glucose 70 - 99 mg/dL 186   BUN 8 - 23 mg/dL 11   Creatinine 0.44 - 1.00 mg/dL 0.80   Sodium 135 - 145 mmol/L 142   Potassium 3.5 - 5.1 mmol/L 3.7   Chloride 98 - 111 mmol/L 108   CO2 22 - 32 mmol/L 27   Calcium 8.9 - 10.3 mg/dL 9.3   Total Protein 6.5 - 8.1 g/dL 7.1    Total Bilirubin 0.3 - 1.2 mg/dL 0.6   Alkaline Phos 38 - 126 U/L 87   AST 15 - 41 U/L 18   ALT 0 - 44 U/L 16       Latest Ref Rng & Units 04/12/2022    9:44 AM  CBC  WBC 4.0 - 10.5 K/uL 6.0   Hemoglobin 12.0 - 15.0 g/dL 13.4   Hematocrit 36.0 - 46.0 % 42.7   Platelets 150 - 400 K/uL 278      Assessment and plan- Patient is a 71 y.o. female with history of iron deficiency anemia here for routine follow-up  Chornic in nature. Last iV iron infusion was Feb 2022. Hemoglobin is still normal but has trended down from 13.7 to 13.4 today. Iron studies pending. I would suggest she trial something like [place] multivitamin with Iron taken in the morning with breakfast as she may be able to tolerate this. Encouraged her to use her cast iron as well. No IV iron needed at this time. Follow up in 6 months with labs (CBC, Iron, ferritin)   Visit Diagnosis 1. Iron deficiency anemia, unspecified iron deficiency anemia type  Nelwyn Salisbury PA-C [place] [facility] 04/12/2022 11:04 AM

## 2022-04-13 DIAGNOSIS — H5203 Hypermetropia, bilateral: Secondary | ICD-10-CM | POA: Diagnosis not present

## 2022-04-13 DIAGNOSIS — H25813 Combined forms of age-related cataract, bilateral: Secondary | ICD-10-CM | POA: Diagnosis not present

## 2022-04-13 DIAGNOSIS — E119 Type 2 diabetes mellitus without complications: Secondary | ICD-10-CM | POA: Diagnosis not present

## 2022-04-13 DIAGNOSIS — H40033 Anatomical narrow angle, bilateral: Secondary | ICD-10-CM | POA: Diagnosis not present

## 2022-04-13 DIAGNOSIS — Z0100 Encounter for examination of eyes and vision without abnormal findings: Secondary | ICD-10-CM | POA: Diagnosis not present

## 2022-04-13 LAB — HM DIABETES EYE EXAM

## 2022-04-17 ENCOUNTER — Other Ambulatory Visit: Payer: Self-pay

## 2022-04-17 ENCOUNTER — Encounter: Payer: Self-pay | Admitting: Emergency Medicine

## 2022-04-17 DIAGNOSIS — R112 Nausea with vomiting, unspecified: Secondary | ICD-10-CM | POA: Diagnosis not present

## 2022-04-17 DIAGNOSIS — I10 Essential (primary) hypertension: Secondary | ICD-10-CM | POA: Diagnosis not present

## 2022-04-17 DIAGNOSIS — R1011 Right upper quadrant pain: Secondary | ICD-10-CM | POA: Diagnosis not present

## 2022-04-17 DIAGNOSIS — K760 Fatty (change of) liver, not elsewhere classified: Secondary | ICD-10-CM | POA: Diagnosis not present

## 2022-04-17 DIAGNOSIS — K8050 Calculus of bile duct without cholangitis or cholecystitis without obstruction: Secondary | ICD-10-CM | POA: Diagnosis not present

## 2022-04-17 DIAGNOSIS — K8020 Calculus of gallbladder without cholecystitis without obstruction: Secondary | ICD-10-CM | POA: Diagnosis not present

## 2022-04-17 LAB — CBC
HCT: 42.2 % (ref 36.0–46.0)
Hemoglobin: 13 g/dL (ref 12.0–15.0)
MCH: 27.6 pg (ref 26.0–34.0)
MCHC: 30.8 g/dL (ref 30.0–36.0)
MCV: 89.6 fL (ref 80.0–100.0)
Platelets: 272 10*3/uL (ref 150–400)
RBC: 4.71 MIL/uL (ref 3.87–5.11)
RDW: 13.7 % (ref 11.5–15.5)
WBC: 8.2 10*3/uL (ref 4.0–10.5)
nRBC: 0 % (ref 0.0–0.2)

## 2022-04-17 LAB — URINALYSIS, ROUTINE W REFLEX MICROSCOPIC
Bacteria, UA: NONE SEEN
Bilirubin Urine: NEGATIVE
Glucose, UA: >=500 mg/dL — AB
Hgb urine dipstick: NEGATIVE
Ketones, ur: NEGATIVE mg/dL
Leukocytes,Ua: NEGATIVE
Nitrite: NEGATIVE
Protein, ur: 30 mg/dL — AB
Specific Gravity, Urine: 1.029 (ref 1.005–1.030)
pH: 5 (ref 5.0–8.0)

## 2022-04-17 LAB — COMPREHENSIVE METABOLIC PANEL
ALT: 22 U/L (ref 0–44)
AST: 21 U/L (ref 15–41)
Albumin: 4 g/dL (ref 3.5–5.0)
Alkaline Phosphatase: 74 U/L (ref 38–126)
Anion gap: 7 (ref 5–15)
BUN: 15 mg/dL (ref 8–23)
CO2: 28 mmol/L (ref 22–32)
Calcium: 9.7 mg/dL (ref 8.9–10.3)
Chloride: 106 mmol/L (ref 98–111)
Creatinine, Ser: 0.85 mg/dL (ref 0.44–1.00)
GFR, Estimated: >60 mL/min (ref >60)
Glucose, Bld: 198 mg/dL — ABNORMAL HIGH (ref 70–99)
Potassium: 4.2 mmol/L (ref 3.5–5.1)
Sodium: 141 mmol/L (ref 135–145)
Total Bilirubin: 0.6 mg/dL (ref 0.3–1.2)
Total Protein: 7.1 g/dL (ref 6.5–8.1)

## 2022-04-17 LAB — LIPASE, BLOOD: Lipase: 36 U/L (ref 11–51)

## 2022-04-17 NOTE — ED Triage Notes (Signed)
Pt presents to ER from home with RUQ pain, vomiting, nausea for the past 3 weeks. Pt was seen last Friday in the ER was diagnosed with gallstones. Made f/u apt with Surgeon but scheduled for next week. Pt reports she continues to vomiting unable to keep her medications down and continues with same pain. Pt talks in complete sentences no distress noted

## 2022-04-18 ENCOUNTER — Emergency Department: Payer: Medicare HMO

## 2022-04-18 ENCOUNTER — Emergency Department
Admission: EM | Admit: 2022-04-18 | Discharge: 2022-04-18 | Disposition: A | Discharge status: Home / Self Care | Payer: Medicare HMO | Attending: Emergency Medicine | Admitting: Emergency Medicine

## 2022-04-18 DIAGNOSIS — K8050 Calculus of bile duct without cholangitis or cholecystitis without obstruction: Secondary | ICD-10-CM

## 2022-04-18 DIAGNOSIS — K8020 Calculus of gallbladder without cholecystitis without obstruction: Secondary | ICD-10-CM | POA: Diagnosis not present

## 2022-04-18 DIAGNOSIS — R1011 Right upper quadrant pain: Secondary | ICD-10-CM | POA: Diagnosis not present

## 2022-04-18 DIAGNOSIS — K760 Fatty (change of) liver, not elsewhere classified: Secondary | ICD-10-CM | POA: Diagnosis not present

## 2022-04-18 DIAGNOSIS — R112 Nausea with vomiting, unspecified: Secondary | ICD-10-CM

## 2022-04-18 MED ORDER — DOCUSATE SODIUM 100 MG PO CAPS: ORAL_CAPSULE | ORAL | 0 refills | Status: DC

## 2022-04-18 MED ORDER — OXYCODONE-ACETAMINOPHEN 5-325 MG PO TABS: 2.0000 | ORAL_TABLET | Freq: Four times a day (QID) | ORAL | 0 refills | Status: DC | PRN

## 2022-04-18 MED ORDER — PROMETHAZINE HCL 12.5 MG RE SUPP: 12.5000 mg | Freq: Four times a day (QID) | RECTAL | 0 refills | Status: DC | PRN

## 2022-04-18 NOTE — Discharge Instructions (Addendum)
You have been seen in the Emergency Department (ED) for abdominal pain.  Your evaluation suggests that your pain is caused by gallstones.  You do not need immediate surgery at this time, but it is important that you follow up with a surgeon as an outpatient; typically surgical removal of the gallbladder is the only thing that will definitively fix your issue.  Read through the included information about a bland diet, and use any prescribed medications as instructed.  Avoid smoking and alcohol use.  Please follow up as instructed above regarding today's emergent visit and the symptoms that are bothering you.  Take Percocet as prescribed. Do not drink alcohol, drive or participate in any other potentially dangerous activities while taking this medication as it may make you sleepy. Do not take this medication with any other sedating medications, either prescription or over-the-counter. If you were prescribed Percocet or Vicodin, do not take these with acetaminophen (Tylenol) as it is already contained within these medications.   This medication is an opiate (or narcotic) pain medication and can be habit forming.  Use it as little as possible to achieve adequate pain control.  Do not use or use it with extreme caution if you have a history of opiate abuse or dependence.  If you are on a pain contract with your primary care doctor or a pain specialist, be sure to let them know you were prescribed this medication today from the [facility] Emergency Department.  This medication is intended for your use only - do not give any to anyone else and keep it in a secure place where nobody else, especially children, have access to it.  It will also cause or worsen constipation, so you may want to consider taking an over-the-counter stool softener while you are taking this medication.  Return to the ED if your abdominal pain worsens or fails to improve, you develop bloody vomiting, bloody diarrhea, you are unable to  tolerate fluids due to vomiting, fever greater than 101, or other symptoms that concern you.

## 2022-04-18 NOTE — ED Provider Notes (Signed)
Cheryl Wallace Provider Note    Event Date/Time   First MD Initiated Contact with Patient 04/18/22 [phone]     (approximate)   History   Abdominal Pain   HPI  Cheryl Wallace is a 71 y.o. female who presents for evaluation of persistent nausea and vomiting and right upper quadrant abdominal pain.  She says she has had problems with her gallbladder since 2014 but it has been much worse over the last 3 weeks or so.  She was seen in the emergency department about 9 days ago and diagnosed with biliary colic.  She has since followed up with a phone call to the surgery clinic and has an appointment with Dr. Dahlia Byes and just over 24 hours.  However she said that today she has been having persistent nausea and vomiting and abdominal pain throughout the day.  She says she has not been able to tolerate even chicken soup.  She still has nausea medicine at home but sometimes she throws that up as well.  Of note, she was also recently diagnosed with cystitis and she took 3 days of antibiotics but then stopped taking it because the pharmacist told her that she may have a reaction with that the way that she had a reaction with penicillins, and she felt like she was itching all over.  She has had occasional diarrhea as well as the nausea and vomiting, but the diarrhea is not consistent.  She thinks she had a low-grade fever earlier today.  She has not had any chest pain or shortness of breath.     Physical Exam   Triage Vital Signs: ED Triage Vitals  Enc Vitals Group     BP 04/17/22 2158 (!) 145/75     Pulse Rate 04/17/22 2158 96     Resp 04/17/22 2158 16     Temp 04/17/22 2158 98.5 F (36.9 C)     Temp Source 04/17/22 2158 Oral     SpO2 04/17/22 2158 90 %     Weight 04/17/22 2200 101.2 kg (223 lb)     Height 04/17/22 2200 1.651 m ('5\' 5"'$ )     Head Circumference --      Peak Flow --      Pain Score 04/17/22 2200 6     Pain Loc --      Pain Edu? --      Excl. in [place]? --      Most recent vital signs: Vitals:   04/17/22 2158 04/18/22 0053  BP: (!) 145/75 (!) 152/70  Pulse: 96 90  Resp: 16 18  Temp: 98.5 F (36.9 C) 98.4 F (36.9 C)  SpO2: 90% 98%     General: Awake, no distress.  CV:  Good peripheral perfusion.  Resp:  Normal effort.  Abd:  Obese.  No distention.  No rebound or guarding.  Mild tenderness to palpation in the epigastrium but only with very deep palpation.  No right upper quadrant tenderness and negative Murphy sign.  Patient is able to continue talking and giving history during my examination. Other:  Mood and affect are appropriate under the circumstances, frustrated at her persistent symptoms, but not exhibiting inappropriate behavior.   ED Results / Procedures / Treatments   Labs (all labs ordered are listed, but only abnormal results are displayed) Labs Reviewed  COMPREHENSIVE METABOLIC PANEL - Abnormal; Notable for the following components:      Result Value   Glucose, Bld 198 (*)    All  other components within normal limits  URINALYSIS, ROUTINE W REFLEX MICROSCOPIC - Abnormal; Notable for the following components:   Color, Urine YELLOW (*)    APPearance CLEAR (*)    Glucose, UA >=500 (*)    Protein, ur 30 (*)    All other components within normal limits  LIPASE, BLOOD  CBC     RADIOLOGY I viewed and interpreted the patient's ultrasound.  I see numerous gallstones but no wall thickening and no pericholecystic fluid.  The radiologist concurs that the gallbladder is distended with stones but without evidence of cholecystitis.  Her common bile duct is within normal limits.    PROCEDURES:  Critical Care performed: No  Procedures   MEDICATIONS ORDERED IN ED: Medications - No data to display   IMPRESSION / MDM / [place] / ED COURSE  I reviewed the triage vital signs and the nursing notes.                              Differential diagnosis includes, but is not limited to, biliary colic,  cholecystitis, cholangitis, pancreatitis, gastritis, SBO/ileus.  Patient's presentation is most consistent with acute presentation with potential threat to life or bodily function.  Vital signs are stable and within normal limits other than hypertension.  Labs/studies ordered include ultrasound of the right upper quadrant, CMP, CBC, lipase, urinalysis.  Urinalysis is normal with no ketones.  Comprehensive metabolic panel is normal including no transaminitis.  No leukocytosis and no elevated lipase.  Ultrasound demonstrates again the cholelithiasis but there is no evidence of cholecystitis nor choledocholithiasis.  Clinically she has no scleral icterus nor jaundice.  The patient is understandably frustrated about her persistent symptoms, but she is actually relatively well-appearing, has no clinically significant tenderness to palpation, afebrile, not tachycardic, no leukocytosis, no transaminitis, and an ultrasound that is negative for cholecystitis or concerns for biliary obstruction.  I provided reassurance and encouraged her to keep her appointment with surgery the following day.  I wrote prescriptions for Percocet and Phenergan suppositories in case that will help and I gave her information about gallbladder eating plan as well as biliary colic.  I asked her to please return to the emergency department if she develops new or worsening symptoms.       FINAL CLINICAL IMPRESSION(S) / ED DIAGNOSES   Final diagnoses:  Biliary colic  Nausea and vomiting, unspecified vomiting type     Rx / DC Orders   ED Discharge Orders          Ordered    oxyCODONE-acetaminophen (PERCOCET) 5-325 MG tablet  Every 6 hours PRN        04/18/22 0402    docusate sodium (COLACE) 100 MG capsule        04/18/22 0402    promethazine (PHENERGAN) 12.5 MG suppository  Every 6 hours PRN        04/18/22 0402             Note:  This document was prepared using Dragon voice recognition software and may  include unintentional dictation errors.   Hinda Kehr, MD 04/18/22 [phone]

## 2022-04-19 ENCOUNTER — Telehealth: Payer: Self-pay

## 2022-04-19 ENCOUNTER — Telehealth: Payer: Self-pay | Admitting: Surgery

## 2022-04-19 ENCOUNTER — Ambulatory Visit (INDEPENDENT_AMBULATORY_CARE_PROVIDER_SITE_OTHER): Payer: Medicare HMO | Admitting: Surgery

## 2022-04-19 ENCOUNTER — Encounter: Payer: Self-pay | Admitting: Surgery

## 2022-04-19 VITALS — BP 145/82 | HR 87 | Temp 98.5°F | Ht 65.0 in | Wt 225.4 lb

## 2022-04-19 DIAGNOSIS — K8020 Calculus of gallbladder without cholecystitis without obstruction: Secondary | ICD-10-CM | POA: Diagnosis not present

## 2022-04-19 NOTE — H&P (View-Only) (Signed)
Patient ID: Cheryl Wallace, female   DOB: 1951-04-06, 71 y.o.   MRN: [id number]  HPI Cheryl Wallace is a 71 y.o. female seen for symptomatic cholelithiasis.  She reports that she has been having right upper quadrant pain for the last 6 months or so.  Pain is intermittent moderate IN intensity dull.  Radiates to the back.  Pain has been worsening for the last 4 weeks or so.  He also endorses some nausea no fevers or chills.  No jaundice no evidence of biliary obstruction She reports that she is so Dr. Bary Castilla from general surgery more than 9 years ago similar symptoms.    sHe now has had 2 recent ER visits. I personally reviewed the right upper quadrant ultrasound showing evidence of cholelithiasis without cholecystitis normal common bile duct.  Recent CBC and CMP was completely normal.  He is a diabetic but is able to perform more than 4 METS of activity without any shortness of breath or chest pain.  He has had a prior hysterectomy and a lower midline laparotomy   HPI  Past Medical History:  Diagnosis Date   Acute meniscal tear, medial, right, sequela 09/22/2020   Allergic rhinitis    Anemia    iron deficiency   Arthritis    osteoarthritis right knee   Asthma    Chronic kidney disease    nephropathy d/t diabetes   Complication of anesthesia 1981   While pt was under anesthesia, pt had an asthema attack during BTL due to having the flu   Depression, major, single episode, mild ([place]) 09/04/2016   Elbow tendonitis    a. bilat, s/p surgery.   Essential hypertension    Functional ovarian cysts 2011   a. s/p TAH (1994);  b. s/p oopherectomy (~2011)   Gallstones    GERD (gastroesophageal reflux disease)    History of kidney stones    Hyperlipidemia    Insomnia    Low grade fever    Obesity    Pneumonia    Polyarthralgia    Seasonal allergies    Seizure ([place])    last episode at 14 years. nothing since. unknown etiology   Tinnitus    Type 2 diabetes mellitus ([place])    a.   Hemoglobin A1c in July 2016, 6.7.    Past Surgical History:  Procedure Laterality Date   ABDOMINAL HYSTERECTOMY  1994   CLOSED MANIPULATION SHOULDER WITH STERIOD INJECTION Right 01/06/2022   Procedure: CLOSED MANIPULATION SHOULDER WITH STEROID INJECTION;  Surgeon: Corky Mull, MD;  Location: [facility];  Service: Orthopedics;  Laterality: Right;   COLONOSCOPY  05/2007   Non-bleeding internal hemorhoids identified.   COLONOSCOPY WITH PROPOFOL N/A 02/25/2020   Procedure: COLONOSCOPY WITH PROPOFOL;  Surgeon: Toledo, Benay Pike, MD;  Location: [facility];  Service: Gastroenterology;  Laterality: N/A;   ELBOW SURGERY Bilateral    d/t tendonitis   ESOPHAGOGASTRODUODENOSCOPY (EGD) WITH PROPOFOL N/A 10/28/2021   Procedure: ESOPHAGOGASTRODUODENOSCOPY (EGD) WITH PROPOFOL;  Surgeon: Toledo, Benay Pike, MD;  Location: [facility];  Service: Gastroenterology;  Laterality: N/A;  DM   FRACTURE SURGERY Right    wrist/hand with plate   KNEE ARTHROSCOPY Right 11/18/2020   Procedure: Right knee arthroscopy, partial medial meniscectomy;  Surgeon: Hessie Knows, MD;  Location: [facility];  Service: Orthopedics;  Laterality: Right;   NASAL SINUS SURGERY  09/18/2015   Procedure: IMAGE GUIDED SINUS SURGERY, BILATERAL MAXILLARY BALLOON SINUPLASTY, BILATERAL FRONTAL BALLOON SINUPLASTY, RIGHT SPHENOID  SINUPLASTY, RIGHT CONCHABULLOSA RESECTION;  Surgeon:  Carloyn Manner, MD;  Location: [facility];  Service: ENT;;   OOPHORECTOMY  04/2010   bilateral, 7 cm benign tumor left ovary   OPEN REDUCTION INTERNAL FIXATION (ORIF) DISTAL RADIAL FRACTURE Right 02/07/2017   Procedure: OPEN REDUCTION INTERNAL FIXATION (ORIF) DISTAL RADIAL FRACTURE;  Surgeon: Hessie Knows, MD;  Location: [facility];  Service: Orthopedics;  Laterality: Right;   PARTIAL HYSTERECTOMY  1994   still has ovaries, removed for fibroids   SHOULDER ARTHROSCOPY WITH SUBACROMIAL DECOMPRESSION, ROTATOR CUFF REPAIR AND BICEP TENDON REPAIR Right 08/06/2021    Procedure: RIGHT SHOULDER ARTHROSCOPY WITH DEBRIDEMENT, DECOMPRESSION, AND POSSIBLE ROTATOR CUFF REPAIR;  Surgeon: Corky Mull, MD;  Location: [facility];  Service: Orthopedics;  Laterality: Right;   TONSILLECTOMY N/A 09/18/2015   Procedure: TONSILLECTOMY;  Surgeon: Carloyn Manner, MD;  Location: [facility];  Service: ENT;  Laterality: N/A;   TONSILLECTOMY     TUBAL LIGATION     UPPER GI ENDOSCOPY  2011    Family History  Problem Relation Age of Onset   Cancer Mother        colon   Tuberculosis Father    Diabetes Father    Heart disease Father        MI x 2   Heart attack Father    Diabetes Sister        half sister    Social History Social History   Tobacco Use   Smoking status: Former    Packs/day: 1.50    Years: 10.00    Total pack years: 15.00    Types: Cigarettes    Quit date: 09/05/1984    Years since quitting: 37.6   Smokeless tobacco: Never   Tobacco comments:    remote, quit 30 years ago  Vaping Use   Vaping Use: Never used  Substance Use Topics   Alcohol use: Not Currently    Alcohol/week: 0.0 standard drinks of alcohol    Comment: on occasion, martinis maybe twice a year (Christmas & New Years).   Drug use: No    Allergies  Allergen Reactions   Shellfish Allergy Swelling    Swelling of face, lips and hands. No difficulty breathing. No problems with topical betadine   Lisinopril Cough    cough   Ozempic (0.25 Or 0.5 Mg-Dose) [Semaglutide(0.25 Or 0.'5mg'$ -Dos)] Nausea And Vomiting   Hctz [Hydrochlorothiazide]     Muscle cramps   Penicillins Rash    Current Outpatient Medications  Medication Sig Dispense Refill   acetaminophen (TYLENOL) 500 MG tablet Take 1,000 mg by mouth every 6 (six) hours as needed for mild pain or moderate pain.     amLODipine (NORVASC) 10 MG tablet TAKE 1 TABLET BY MOUTH EVERYDAY AT BEDTIME 90 tablet 1   aspirin 81 MG chewable tablet Chew 81 mg by mouth in the morning.     atorvastatin (LIPITOR) 20 MG tablet Take 1 tablet (20  mg total) by mouth at bedtime.     carvedilol (COREG) 3.125 MG tablet TAKE 1 TABLET (3.125 MG TOTAL) BY MOUTH 2 (TWO) TIMES DAILY WITH A MEAL. 180 tablet 1   cholecalciferol (VITAMIN D) 1000 UNITS tablet Take 1,000 Units by mouth daily.      diphenhydrAMINE (BENADRYL) 50 MG tablet Take 50 mg by mouth at bedtime.     docusate sodium (COLACE) 100 MG capsule Take 1 tablet once or twice daily as needed for constipation while taking narcotic pain medicine 30 capsule 0   DULoxetine HCl 40 MG CPEP Take 40 mg by  mouth at bedtime.     fexofenadine (ALLEGRA) 180 MG tablet Take 180 mg by mouth every morning.     glipiZIDE (GLUCOTROL) 10 MG tablet TAKE 1 TABLET (10 MG TOTAL) BY MOUTH 2 (TWO) TIMES DAILY BEFORE A MEAL. 180 tablet 1   JARDIANCE 10 MG TABS tablet TAKE 1 TABLET BY MOUTH EVERY DAY 90 tablet 1   metFORMIN (GLUCOPHAGE) 1000 MG tablet TAKE 1 TABLET BY MOUTH 2 TIMES DAILY WITH A MEAL. 180 tablet 1   omeprazole (PRILOSEC) 40 MG capsule Take 40 mg by mouth daily before breakfast.     ondansetron (ZOFRAN-ODT) 4 MG disintegrating tablet Take 1 tablet (4 mg total) by mouth every 8 (eight) hours as needed for nausea or vomiting. 20 tablet 0   oxyCODONE-acetaminophen (PERCOCET) 5-325 MG tablet Take 2 tablets by mouth every 6 (six) hours as needed for severe pain. 16 tablet 0   promethazine (PHENERGAN) 12.5 MG suppository Place 1 suppository (12.5 mg total) rectally every 6 (six) hours as needed for nausea or vomiting. 20 each 0   tiZANidine (ZANAFLEX) 2 MG tablet TAKE 1 TABLET BY MOUTH EVERY 6 HOURS AS NEEDED FOR MUSCLE SPASMS. 360 tablet 1   valsartan (DIOVAN) 40 MG tablet TAKE 1 TABLET BY MOUTH EVERYDAY AT BEDTIME 90 tablet 1   zolpidem (AMBIEN) 5 MG tablet TAKE 1 TABLET BY MOUTH EVERY DAY AT BEDTIME AS NEEDED FOR SLEEP 30 tablet 2   No current facility-administered medications for this visit.     Review of Systems Full ROS  was asked and was negative except for the information on the HPI  Physical  Exam Blood pressure (!) 145/82, pulse 87, temperature 98.5 F (36.9 C), temperature source Oral, height '5\' 5"'$  (1.651 m), weight 225 lb 6.4 oz (102.2 kg), SpO2 96 %.   CONSTITUTIONAL: NAD. EYES: Pupils are equal, round,Sclera are non-icteric. EARS, NOSE, MOUTH AND THROAT:  The oral mucosa is pink and moist. Hearing is intact to voice. LYMPH NODES:  Lymph nodes in the neck are normal. RESPIRATORY:  Lungs are clear. There is normal respiratory effort, with equal breath sounds bilaterally, and without pathologic use of accessory muscles. CARDIOVASCULAR: Heart is regular without murmurs, gallops, or rubs. GI: The abdomen is  soft,mild tenderness RUQ w/o Murphy sign. No peritonitis. , and nondistended. There are no palpable masses. There is no hepatosplenomegaly. There are normal bowel sounds. LOWER MIDLINE LAPAROTOMY SCAR GU: Rectal deferred.   MUSCULOSKELETAL: Normal muscle strength and tone. No cyanosis or edema.   SKIN: Turgor is good and there are no pathologic skin lesions or ulcers. NEUROLOGIC: Motor and sensation is grossly normal. Cranial nerves are grossly intact. PSYCH:  Oriented to person, place and time. Affect is normal.   Data Reviewed   I have personally reviewed the patient's imaging, laboratory findings and medical records.     Assessment /Plan 71 year old female with RUQ pain and gallstones.  Discussed with the patient in detail that her pain certainly can be explained by symptomatic cholelithiasis.  Discussed with patient detail about the role of cholecystectomy.  Alternatives also include consultation with GI for further diagnostic work-up versus proceeding with cholecystectomy. Patient wishes to proceed with cholecystectomy and I do think that she will be a good candidate for robotic approach. I discussed the procedure in detail.  The patient was given Neurosurgeon.  We discussed the risks and benefits of a laparoscopic cholecystectomy and possible cholangiogram  including, but not limited to bleeding, infection, injury to surrounding structures such as  the intestine or liver, bile leak, retained gallstones, need to convert to an open procedure, prolonged diarrhea, blood clots such as  DVT, common bile duct injury, anesthesia risks, and possible need for additional procedures.  The likelihood of improvement in symptoms and return to the patient's normal status is good. We discussed the typical post-operative recovery course.  A copy of this report was sent to the referring provider I spent greater than 60 minutes in this encounter including coordination of her care, personally reviewing imaging studies, operative records, placing orders and performing appropriate documentation     Caroleen Hamman, MD FACS General Surgeon 04/19/2022, 1:33 PM

## 2022-04-19 NOTE — Telephone Encounter (Signed)
I called to follow-up with patient after trip to the ED for bilary colic. She stated that she had previously been in the ED 8/4 & was luckily already set up with a general surgeon for today. She is scheduled to have her gallbladder taken out 8/22 & follows up with Dr. Derrel Nip 8/28.

## 2022-04-19 NOTE — Patient Instructions (Signed)
Our surgery scheduler Barbara will call you within 24-48 hours to get you scheduled. If you have not heard from her after 48 hours, please call our office. Have the blue sheet available when she calls to write down important information.   If you have any concerns or questions, please feel free to call our office.    Minimally Invasive Cholecystectomy  Minimally invasive cholecystectomy is surgery to remove the gallbladder. The gallbladder is a pear-shaped organ that lies beneath the liver on the right side of the body. The gallbladder stores bile, which is a fluid that helps the body digest fats. Cholecystectomy is often done to treat inflammation (irritation and swelling) of the gallbladder (cholecystitis). This condition is usually caused by a buildup of gallstones (cholelithiasis) in the gallbladder or when the fluid in the gall bladder becomes stagnant because gallstones get stuck in the ducts (tubes) and block the flow of bile. This can result in inflammation and pain. In severe cases, emergency surgery may be required. This procedure is done through small incisions in the abdomen, instead of one large incision. It is also called laparoscopic surgery. A thin scope with a camera (laparoscope) is inserted through one incision. Then surgical instruments are inserted through the other incisions. In some cases, a minimally invasive surgery may need to be changed to a surgery that is done through a larger incision. This is called open surgery. Tell a health care provider about: Any allergies you have. All medicines you are taking, including vitamins, herbs, eye drops, creams, and over-the-counter medicines. Any problems you or family members have had with anesthetic medicines. Any bleeding problems you have. Any surgeries you have had. Any medical conditions you have. Whether you are pregnant or may be pregnant. What are the risks? Generally, this is a safe procedure. However, problems may occur,  including: Infection. Bleeding. Allergic reactions to medicines. Damage to nearby structures or organs. A gallstone remaining in the common bile duct. The common bile duct carries bile from the gallbladder to the small intestine. A bile leak from the liver or cystic duct after your gallbladder is removed. What happens before the procedure? When to stop eating and drinking Follow instructions from your health care provider about what you may eat and drink before your procedure. These may include: 8 hours before the procedure Stop eating most foods. Do not eat meat, fried foods, or fatty foods. Eat only light foods, such as toast or crackers. All liquids are okay except energy drinks and alcohol. 6 hours before the procedure Stop eating. Drink only clear liquids, such as water, clear fruit juice, black coffee, plain tea, and sports drinks. Do not drink energy drinks or alcohol. 2 hours before the procedure Stop drinking all liquids. You may be allowed to take medicines with small sips of water. If you do not follow your health care provider's instructions, your procedure may be delayed or canceled. Medicines Ask your health care provider about: Changing or stopping your regular medicines. This is especially important if you are taking diabetes medicines or blood thinners. Taking medicines such as aspirin and ibuprofen. These medicines can thin your blood. Do not take these medicines unless your health care provider tells you to take them. Taking over-the-counter medicines, vitamins, herbs, and supplements. General instructions If you will be going home right after the procedure, plan to have a responsible adult: Take you home from the hospital or clinic. You will not be allowed to drive. Care for you for the time you are   told. Do not use any products that contain nicotine or tobacco for at least 4 weeks before the procedure. These products include cigarettes, chewing tobacco, and vaping  devices, such as e-cigarettes. If you need help quitting, ask your health care provider. Ask your health care provider: How your surgery site will be marked. What steps will be taken to help prevent infection. These may include: Removing hair at the surgery site. Washing skin with a germ-killing soap. Taking antibiotic medicine. What happens during the procedure?  An IV will be inserted into one of your veins. You will be given one or both of the following: A medicine to help you relax (sedative). A medicine to make you fall asleep (general anesthetic). Your surgeon will make several small incisions in your abdomen. The laparoscope will be inserted through one of the small incisions. The camera on the laparoscope will send images to a monitor in the operating room. This lets your surgeon see inside your abdomen. A gas will be pumped into your abdomen. This will expand your abdomen to give the surgeon more room to perform the surgery. Other tools that are needed for the procedure will be inserted through the other incisions. The gallbladder will be removed through one of the incisions. Your common bile duct may be examined. If stones are found in the common bile duct, they may be removed. After your gallbladder has been removed, the incisions will be closed with stitches (sutures), staples, or skin glue. Your incisions will be covered with a bandage (dressing). The procedure may vary among health care providers and hospitals. What happens after the procedure? Your blood pressure, heart rate, breathing rate, and blood oxygen level will be monitored until you leave the hospital or clinic. You will be given medicines as needed to control your pain. You may have a drain placed in the incision. The drain will be removed a day or two after the procedure. Summary Minimally invasive cholecystectomy, also called laparoscopic cholecystectomy, is surgery to remove the gallbladder using small  incisions. Tell your health care provider about all the medical conditions you have and all the medicines you are taking for those conditions. Before the procedure, follow instructions about when to stop eating and drinking and changing or stopping medicines. Plan to have a responsible adult care for you for the time you are told after you leave the hospital or clinic. This information is not intended to replace advice given to you by your health care provider. Make sure you discuss any questions you have with your health care provider. Document Revised: 02/24/2021 Document Reviewed: 02/24/2021 [employer] Patient Education  2023 [employer]  

## 2022-04-19 NOTE — Progress Notes (Signed)
Patient ID: Cheryl Wallace, female   DOB: [birth year], 71 y.o.   MRN: [id number]  HPI Cheryl Wallace is a 71 y.o. female seen for symptomatic cholelithiasis.  She reports that she has been having right upper quadrant pain for the last 6 months or so.  Pain is intermittent moderate IN intensity dull.  Radiates to the back.  Pain has been worsening for the last 4 weeks or so.  He also endorses some nausea no fevers or chills.  No jaundice no evidence of biliary obstruction She reports that she is so Dr. Bary Castilla from general surgery more than 9 years ago similar symptoms.    sHe now has had 2 recent ER visits. I personally reviewed the right upper quadrant ultrasound showing evidence of cholelithiasis without cholecystitis normal common bile duct.  Recent CBC and CMP was completely normal.  He is a diabetic but is able to perform more than 4 METS of activity without any shortness of breath or chest pain.  He has had a prior hysterectomy and a lower midline laparotomy   HPI  Past Medical History:  Diagnosis Date   Acute meniscal tear, medial, right, sequela 09/22/2020   Allergic rhinitis    Anemia    iron deficiency   Arthritis    osteoarthritis right knee   Asthma    Chronic kidney disease    nephropathy d/t diabetes   Complication of anesthesia 1981   While pt was under anesthesia, pt had an asthema attack during BTL due to having the flu   Depression, major, single episode, mild ([place]) 09/04/2016   Elbow tendonitis    a. bilat, s/p surgery.   Essential hypertension    Functional ovarian cysts 2011   a. s/p TAH (1994);  b. s/p oopherectomy (~2011)   Gallstones    GERD (gastroesophageal reflux disease)    History of kidney stones    Hyperlipidemia    Insomnia    Low grade fever    Obesity    Pneumonia    Polyarthralgia    Seasonal allergies    Seizure ([place])    last episode at 14 years. nothing since. unknown etiology   Tinnitus    Type 2 diabetes mellitus ([place])    a.   Hemoglobin A1c in July 2016, 6.7.    Past Surgical History:  Procedure Laterality Date   ABDOMINAL HYSTERECTOMY  1994   CLOSED MANIPULATION SHOULDER WITH STERIOD INJECTION Right 01/06/2022   Procedure: CLOSED MANIPULATION SHOULDER WITH STEROID INJECTION;  Surgeon: Corky Mull, MD;  Location: [facility];  Service: Orthopedics;  Laterality: Right;   COLONOSCOPY  05/2007   Non-bleeding internal hemorhoids identified.   COLONOSCOPY WITH PROPOFOL N/A 02/25/2020   Procedure: COLONOSCOPY WITH PROPOFOL;  Surgeon: Toledo, Benay Pike, MD;  Location: [facility];  Service: Gastroenterology;  Laterality: N/A;   ELBOW SURGERY Bilateral    d/t tendonitis   ESOPHAGOGASTRODUODENOSCOPY (EGD) WITH PROPOFOL N/A 10/28/2021   Procedure: ESOPHAGOGASTRODUODENOSCOPY (EGD) WITH PROPOFOL;  Surgeon: Toledo, Benay Pike, MD;  Location: [facility];  Service: Gastroenterology;  Laterality: N/A;  DM   FRACTURE SURGERY Right    wrist/hand with plate   KNEE ARTHROSCOPY Right 11/18/2020   Procedure: Right knee arthroscopy, partial medial meniscectomy;  Surgeon: Hessie Knows, MD;  Location: [facility];  Service: Orthopedics;  Laterality: Right;   NASAL SINUS SURGERY  09/18/2015   Procedure: IMAGE GUIDED SINUS SURGERY, BILATERAL MAXILLARY BALLOON SINUPLASTY, BILATERAL FRONTAL BALLOON SINUPLASTY, RIGHT SPHENOID  SINUPLASTY, RIGHT CONCHABULLOSA RESECTION;  Surgeon:  Carloyn Manner, MD;  Location: [facility];  Service: ENT;;   OOPHORECTOMY  04/2010   bilateral, 7 cm benign tumor left ovary   OPEN REDUCTION INTERNAL FIXATION (ORIF) DISTAL RADIAL FRACTURE Right 02/07/2017   Procedure: OPEN REDUCTION INTERNAL FIXATION (ORIF) DISTAL RADIAL FRACTURE;  Surgeon: Hessie Knows, MD;  Location: [facility];  Service: Orthopedics;  Laterality: Right;   PARTIAL HYSTERECTOMY  1994   still has ovaries, removed for fibroids   SHOULDER ARTHROSCOPY WITH SUBACROMIAL DECOMPRESSION, ROTATOR CUFF REPAIR AND BICEP TENDON REPAIR Right 08/06/2021    Procedure: RIGHT SHOULDER ARTHROSCOPY WITH DEBRIDEMENT, DECOMPRESSION, AND POSSIBLE ROTATOR CUFF REPAIR;  Surgeon: Corky Mull, MD;  Location: [facility];  Service: Orthopedics;  Laterality: Right;   TONSILLECTOMY N/A 09/18/2015   Procedure: TONSILLECTOMY;  Surgeon: Carloyn Manner, MD;  Location: [facility];  Service: ENT;  Laterality: N/A;   TONSILLECTOMY     TUBAL LIGATION     UPPER GI ENDOSCOPY  2011    Family History  Problem Relation Age of Onset   Cancer Mother        colon   Tuberculosis Father    Diabetes Father    Heart disease Father        MI x 2   Heart attack Father    Diabetes Sister        half sister    Social History Social History   Tobacco Use   Smoking status: Former    Packs/day: 1.50    Years: 10.00    Total pack years: 15.00    Types: Cigarettes    Quit date: 09/05/1984    Years since quitting: 37.6   Smokeless tobacco: Never   Tobacco comments:    remote, quit 30 years ago  Vaping Use   Vaping Use: Never used  Substance Use Topics   Alcohol use: Not Currently    Alcohol/week: 0.0 standard drinks of alcohol    Comment: on occasion, martinis maybe twice a year (Christmas & New Years).   Drug use: No    Allergies  Allergen Reactions   Shellfish Allergy Swelling    Swelling of face, lips and hands. No difficulty breathing. No problems with topical betadine   Lisinopril Cough    cough   Ozempic (0.25 Or 0.5 Mg-Dose) [Semaglutide(0.25 Or 0.'5mg'$ -Dos)] Nausea And Vomiting   Hctz [Hydrochlorothiazide]     Muscle cramps   Penicillins Rash    Current Outpatient Medications  Medication Sig Dispense Refill   acetaminophen (TYLENOL) 500 MG tablet Take 1,000 mg by mouth every 6 (six) hours as needed for mild pain or moderate pain.     amLODipine (NORVASC) 10 MG tablet TAKE 1 TABLET BY MOUTH EVERYDAY AT BEDTIME 90 tablet 1   aspirin 81 MG chewable tablet Chew 81 mg by mouth in the morning.     atorvastatin (LIPITOR) 20 MG tablet Take 1 tablet (20  mg total) by mouth at bedtime.     carvedilol (COREG) 3.125 MG tablet TAKE 1 TABLET (3.125 MG TOTAL) BY MOUTH 2 (TWO) TIMES DAILY WITH A MEAL. 180 tablet 1   cholecalciferol (VITAMIN D) 1000 UNITS tablet Take 1,000 Units by mouth daily.      diphenhydrAMINE (BENADRYL) 50 MG tablet Take 50 mg by mouth at bedtime.     docusate sodium (COLACE) 100 MG capsule Take 1 tablet once or twice daily as needed for constipation while taking narcotic pain medicine 30 capsule 0   DULoxetine HCl 40 MG CPEP Take 40 mg by  mouth at bedtime.     fexofenadine (ALLEGRA) 180 MG tablet Take 180 mg by mouth every morning.     glipiZIDE (GLUCOTROL) 10 MG tablet TAKE 1 TABLET (10 MG TOTAL) BY MOUTH 2 (TWO) TIMES DAILY BEFORE A MEAL. 180 tablet 1   JARDIANCE 10 MG TABS tablet TAKE 1 TABLET BY MOUTH EVERY DAY 90 tablet 1   metFORMIN (GLUCOPHAGE) 1000 MG tablet TAKE 1 TABLET BY MOUTH 2 TIMES DAILY WITH A MEAL. 180 tablet 1   omeprazole (PRILOSEC) 40 MG capsule Take 40 mg by mouth daily before breakfast.     ondansetron (ZOFRAN-ODT) 4 MG disintegrating tablet Take 1 tablet (4 mg total) by mouth every 8 (eight) hours as needed for nausea or vomiting. 20 tablet 0   oxyCODONE-acetaminophen (PERCOCET) 5-325 MG tablet Take 2 tablets by mouth every 6 (six) hours as needed for severe pain. 16 tablet 0   promethazine (PHENERGAN) 12.5 MG suppository Place 1 suppository (12.5 mg total) rectally every 6 (six) hours as needed for nausea or vomiting. 20 each 0   tiZANidine (ZANAFLEX) 2 MG tablet TAKE 1 TABLET BY MOUTH EVERY 6 HOURS AS NEEDED FOR MUSCLE SPASMS. 360 tablet 1   valsartan (DIOVAN) 40 MG tablet TAKE 1 TABLET BY MOUTH EVERYDAY AT BEDTIME 90 tablet 1   zolpidem (AMBIEN) 5 MG tablet TAKE 1 TABLET BY MOUTH EVERY DAY AT BEDTIME AS NEEDED FOR SLEEP 30 tablet 2   No current facility-administered medications for this visit.     Review of Systems Full ROS  was asked and was negative except for the information on the HPI  Physical  Exam Blood pressure (!) 145/82, pulse 87, temperature 98.5 F (36.9 C), temperature source Oral, height '5\' 5"'$  (1.651 m), weight 225 lb 6.4 oz (102.2 kg), SpO2 96 %.   CONSTITUTIONAL: NAD. EYES: Pupils are equal, round,Sclera are non-icteric. EARS, NOSE, MOUTH AND THROAT:  The oral mucosa is pink and moist. Hearing is intact to voice. LYMPH NODES:  Lymph nodes in the neck are normal. RESPIRATORY:  Lungs are clear. There is normal respiratory effort, with equal breath sounds bilaterally, and without pathologic use of accessory muscles. CARDIOVASCULAR: Heart is regular without murmurs, gallops, or rubs. GI: The abdomen is  soft,mild tenderness RUQ w/o Murphy sign. No peritonitis. , and nondistended. There are no palpable masses. There is no hepatosplenomegaly. There are normal bowel sounds. LOWER MIDLINE LAPAROTOMY SCAR GU: Rectal deferred.   MUSCULOSKELETAL: Normal muscle strength and tone. No cyanosis or edema.   SKIN: Turgor is good and there are no pathologic skin lesions or ulcers. NEUROLOGIC: Motor and sensation is grossly normal. Cranial nerves are grossly intact. PSYCH:  Oriented to person, place and time. Affect is normal.   Data Reviewed   I have personally reviewed the patient's imaging, laboratory findings and medical records.     Assessment /Plan 71 year old female with RUQ pain and gallstones.  Discussed with the patient in detail that her pain certainly can be explained by symptomatic cholelithiasis.  Discussed with patient detail about the role of cholecystectomy.  Alternatives also include consultation with GI for further diagnostic work-up versus proceeding with cholecystectomy. Patient wishes to proceed with cholecystectomy and I do think that she will be a good candidate for robotic approach. I discussed the procedure in detail.  The patient was given Neurosurgeon.  We discussed the risks and benefits of a laparoscopic cholecystectomy and possible cholangiogram  including, but not limited to bleeding, infection, injury to surrounding structures such as  the intestine or liver, bile leak, retained gallstones, need to convert to an open procedure, prolonged diarrhea, blood clots such as  DVT, common bile duct injury, anesthesia risks, and possible need for additional procedures.  The likelihood of improvement in symptoms and return to the patient's normal status is good. We discussed the typical post-operative recovery course.  A copy of this report was sent to the referring provider I spent greater than 60 minutes in this encounter including coordination of her care, personally reviewing imaging studies, operative records, placing orders and performing appropriate documentation     Caroleen Hamman, MD FACS General Surgeon 04/19/2022, 1:33 PM

## 2022-04-19 NOTE — Telephone Encounter (Signed)
Patient has been advised of Pre-Admission date/time, and Surgery date.  Surgery Date: 04/27/22 Preadmission Testing Date: 04/22/22 (phone 8a-1p)  Patient has been made aware to call [phone], between 1-3:00pm the day before surgery, to find out what time to arrive for surgery.

## 2022-04-21 ENCOUNTER — Other Ambulatory Visit: Payer: Self-pay | Admitting: Internal Medicine

## 2022-04-21 DIAGNOSIS — E1169 Type 2 diabetes mellitus with other specified complication: Secondary | ICD-10-CM

## 2022-04-22 ENCOUNTER — Encounter
Admission: RE | Admit: 2022-04-22 | Discharge: 2022-04-22 | Disposition: A | Discharge status: Home / Self Care | Payer: Medicare HMO | Source: Ambulatory Visit | Attending: Surgery | Admitting: Surgery

## 2022-04-22 VITALS — Ht 65.0 in | Wt 213.0 lb

## 2022-04-22 DIAGNOSIS — E786 Lipoprotein deficiency: Secondary | ICD-10-CM | POA: Diagnosis not present

## 2022-04-22 DIAGNOSIS — I10 Essential (primary) hypertension: Secondary | ICD-10-CM | POA: Diagnosis not present

## 2022-04-22 DIAGNOSIS — E1121 Type 2 diabetes mellitus with diabetic nephropathy: Secondary | ICD-10-CM

## 2022-04-22 DIAGNOSIS — Z01810 Encounter for preprocedural cardiovascular examination: Secondary | ICD-10-CM | POA: Insufficient documentation

## 2022-04-22 DIAGNOSIS — E119 Type 2 diabetes mellitus without complications: Secondary | ICD-10-CM | POA: Diagnosis not present

## 2022-04-22 NOTE — Patient Instructions (Signed)
Your procedure is scheduled on: 04/27/22 Report to [facility]. To find out your arrival time please call [phone] between 1PM - 3PM on 04/26/22.  Remember: Instructions that are not followed completely may result in serious medical risk, up to and including death, or upon the discretion of your surgeon and anesthesiologist your surgery may need to be rescheduled.     _X__ 1. Do not eat food after midnight the night before your procedure.                 No gum chewing or hard candies. You may drink clear liquids up to 2 hours                 before you are scheduled to arrive for your surgery- DO not drink clear                 liquids within 2 hours of the start of your surgery.                 Clear Liquids include:   Diabetics water only  __X__2.  On the morning of surgery brush your teeth with toothpaste and water, you                 may rinse your mouth with mouthwash if you wish.  Do not swallow any              toothpaste of mouthwash.     _X__ 3.  No Alcohol for 24 hours before or after surgery.   _X__ 4.  Do Not Smoke or use e-cigarettes For 24 Hours Prior to Your Surgery.                 Do not use any chewable tobacco products for at least 6 hours prior to                 surgery.  ____  5.  Bring all medications with you on the day of surgery if instructed.   __X__  6.  Notify your doctor if there is any change in your medical condition      (cold, fever, infections).     Do not wear jewelry, make-up, hairpins, clips or nail polish. Do not wear lotions, powders, or perfumes. May wear deodorant Do not shave  body hair 48 hours prior to surgery. Men may shave face and neck. Do not bring valuables to the hospital.    [facility] is not responsible for any belongings or valuables.  Contacts, dentures/partials or body piercings may not be worn into surgery. Bring a case for your contacts, glasses or hearing aids, a  denture cup will be supplied. Leave your suitcase in the car. After surgery it may be brought to your room. For patients admitted to the hospital, discharge time is determined by your treatment team.   Patients discharged the day of surgery will not be allowed to drive home.   Please read over the following fact sheets that you were given:    CHG soap  __X__ Take these medicines the morning of surgery with A SIP OF WATER:    1. carvedilol (COREG) 3.125 MG tablet  2. fexofenadine (ALLEGRA) 180 MG tablet  3. omeprazole (PRILOSEC) 20 MG capsule  4.  5.  6.  ____ Fleet Enema (as directed)   __X__ Use CHG Soap/SAGE wipes as directed  ____ Use inhalers on the day of  surgery  ____ Stop metformin/Janumet/Farxiga 2 days prior to surgery    ____ Take 1/2 of usual insulin dose the night before surgery. No insulin the morning          of surgery.   ____ Stop Blood Thinners Coumadin/Plavix/Xarelto/Pleta/Pradaxa/Eliquis/Effient/Aspirin  on   Or contact your Surgeon, Cardiologist or Medical Doctor regarding  ability to stop your blood thinners  __X__ Stop Anti-inflammatories 7 days before surgery such as Advil, Ibuprofen, Motrin,  BC or Goodies Powder, Naprosyn, Naproxen, Aleve, Aspirin    __X__ Stop all herbals supplements, fish oil or vitamin E until after surgery.    ____ Bring C-Pap to the hospital.

## 2022-04-27 ENCOUNTER — Encounter
Admission: RE | Disposition: A | Discharge status: Home / Self Care | Payer: Self-pay | Source: Home / Self Care | Attending: Surgery

## 2022-04-27 ENCOUNTER — Ambulatory Visit: Payer: Medicare HMO | Admitting: Certified Registered"

## 2022-04-27 ENCOUNTER — Other Ambulatory Visit: Payer: Self-pay

## 2022-04-27 ENCOUNTER — Encounter: Payer: Self-pay | Admitting: Surgery

## 2022-04-27 ENCOUNTER — Ambulatory Visit
Admission: RE | Admit: 2022-04-27 | Discharge: 2022-04-27 | Disposition: A | Discharge status: Home / Self Care | Payer: Medicare HMO | Attending: Surgery | Admitting: Surgery

## 2022-04-27 DIAGNOSIS — K219 Gastro-esophageal reflux disease without esophagitis: Secondary | ICD-10-CM | POA: Insufficient documentation

## 2022-04-27 DIAGNOSIS — E1122 Type 2 diabetes mellitus with diabetic chronic kidney disease: Secondary | ICD-10-CM | POA: Insufficient documentation

## 2022-04-27 DIAGNOSIS — K8010 Calculus of gallbladder with chronic cholecystitis without obstruction: Secondary | ICD-10-CM

## 2022-04-27 DIAGNOSIS — Z90710 Acquired absence of both cervix and uterus: Secondary | ICD-10-CM | POA: Insufficient documentation

## 2022-04-27 DIAGNOSIS — Z87891 Personal history of nicotine dependence: Secondary | ICD-10-CM | POA: Insufficient documentation

## 2022-04-27 DIAGNOSIS — N189 Chronic kidney disease, unspecified: Secondary | ICD-10-CM | POA: Insufficient documentation

## 2022-04-27 DIAGNOSIS — E669 Obesity, unspecified: Secondary | ICD-10-CM | POA: Insufficient documentation

## 2022-04-27 DIAGNOSIS — I10 Essential (primary) hypertension: Secondary | ICD-10-CM | POA: Diagnosis not present

## 2022-04-27 DIAGNOSIS — K8060 Calculus of gallbladder and bile duct with cholecystitis, unspecified, without obstruction: Secondary | ICD-10-CM | POA: Diagnosis not present

## 2022-04-27 DIAGNOSIS — I129 Hypertensive chronic kidney disease with stage 1 through stage 4 chronic kidney disease, or unspecified chronic kidney disease: Secondary | ICD-10-CM | POA: Insufficient documentation

## 2022-04-27 DIAGNOSIS — E119 Type 2 diabetes mellitus without complications: Secondary | ICD-10-CM | POA: Diagnosis not present

## 2022-04-27 DIAGNOSIS — K8020 Calculus of gallbladder without cholecystitis without obstruction: Secondary | ICD-10-CM

## 2022-04-27 DIAGNOSIS — E1121 Type 2 diabetes mellitus with diabetic nephropathy: Secondary | ICD-10-CM

## 2022-04-27 DIAGNOSIS — K8050 Calculus of bile duct without cholangitis or cholecystitis without obstruction: Secondary | ICD-10-CM | POA: Diagnosis not present

## 2022-04-27 LAB — GLUCOSE, CAPILLARY
Glucose-Capillary: 194 mg/dL — ABNORMAL HIGH (ref 70–99)
Glucose-Capillary: 216 mg/dL — ABNORMAL HIGH (ref 70–99)

## 2022-04-27 SURGERY — CHOLECYSTECTOMY, ROBOT-ASSISTED, LAPAROSCOPIC
Anesthesia: General | Site: Abdomen

## 2022-04-27 MED ORDER — CELECOXIB 200 MG PO CAPS: 200.0000 mg | ORAL_CAPSULE | ORAL | Status: AC

## 2022-04-27 MED ORDER — HYDROCODONE-ACETAMINOPHEN 5-325 MG PO TABS: 1.0000 | ORAL_TABLET | ORAL | 0 refills | Status: DC | PRN

## 2022-04-27 MED ORDER — FENTANYL CITRATE (PF) 100 MCG/2ML IJ SOLN
INTRAMUSCULAR | Status: AC
  Filled 2022-04-27: qty 2

## 2022-04-27 MED ORDER — CEFAZOLIN SODIUM-DEXTROSE 2-4 GM/100ML-% IV SOLN
2.0000 g | INTRAVENOUS | Status: AC
  Administered 2022-04-27: 2 g via INTRAVENOUS

## 2022-04-27 MED ORDER — CHLORHEXIDINE GLUCONATE 0.12 % MT SOLN: 15.0000 mL | Freq: Once | OROMUCOSAL | Status: AC

## 2022-04-27 MED ORDER — OXYCODONE HCL 5 MG PO TABS
5.0000 mg | ORAL_TABLET | Freq: Once | ORAL | Status: AC | PRN
  Administered 2022-04-27: 5 mg via ORAL

## 2022-04-27 MED ORDER — BUPIVACAINE HCL (PF) 0.25 % IJ SOLN
INTRAMUSCULAR | Status: AC
  Filled 2022-04-27: qty 30

## 2022-04-27 MED ORDER — BUPIVACAINE LIPOSOME 1.3 % IJ SUSP
INTRAMUSCULAR | Status: AC
Start: 2022-04-27 — End: ?
  Filled 2022-04-27: qty 20

## 2022-04-27 MED ORDER — FENTANYL CITRATE (PF) 100 MCG/2ML IJ SOLN
25.0000 ug | INTRAMUSCULAR | Status: DC | PRN
  Administered 2022-04-27 (×2): 50 ug via INTRAVENOUS

## 2022-04-27 MED ORDER — ACETAMINOPHEN 500 MG PO TABS
ORAL_TABLET | ORAL | Status: AC
  Administered 2022-04-27: 1000 mg via ORAL
  Filled 2022-04-27: qty 2

## 2022-04-27 MED ORDER — PHENYLEPHRINE HCL (PRESSORS) 10 MG/ML IV SOLN
INTRAVENOUS | Status: DC | PRN
  Administered 2022-04-27 (×5): 80 ug via INTRAVENOUS

## 2022-04-27 MED ORDER — ROCURONIUM BROMIDE 100 MG/10ML IV SOLN
INTRAVENOUS | Status: DC | PRN
  Administered 2022-04-27: 10 mg via INTRAVENOUS
  Administered 2022-04-27: 50 mg via INTRAVENOUS

## 2022-04-27 MED ORDER — OXYCODONE HCL 5 MG PO TABS
ORAL_TABLET | ORAL | Status: AC
  Filled 2022-04-27: qty 1

## 2022-04-27 MED ORDER — CHLORHEXIDINE GLUCONATE 0.12 % MT SOLN
OROMUCOSAL | Status: AC
  Administered 2022-04-27: 15 mL via OROMUCOSAL
  Filled 2022-04-27: qty 15

## 2022-04-27 MED ORDER — DEXAMETHASONE SODIUM PHOSPHATE 10 MG/ML IJ SOLN
INTRAMUSCULAR | Status: DC | PRN
  Administered 2022-04-27: 5 mg via INTRAVENOUS

## 2022-04-27 MED ORDER — ONDANSETRON HCL 4 MG/2ML IJ SOLN
INTRAMUSCULAR | Status: DC | PRN
  Administered 2022-04-27: 4 mg via INTRAVENOUS

## 2022-04-27 MED ORDER — GABAPENTIN 300 MG PO CAPS: 300.0000 mg | ORAL_CAPSULE | ORAL | Status: AC

## 2022-04-27 MED ORDER — INSULIN ASPART 100 UNIT/ML IJ SOLN
INTRAMUSCULAR | Status: AC
  Filled 2022-04-27: qty 1

## 2022-04-27 MED ORDER — ACETAMINOPHEN 500 MG PO TABS: 1000.0000 mg | ORAL_TABLET | ORAL | Status: AC

## 2022-04-27 MED ORDER — CHLORHEXIDINE GLUCONATE CLOTH 2 % EX PADS: 6.0000 | MEDICATED_PAD | Freq: Once | CUTANEOUS | Status: DC

## 2022-04-27 MED ORDER — DROPERIDOL 2.5 MG/ML IJ SOLN
0.6250 mg | Freq: Once | INTRAMUSCULAR | Status: DC | PRN
Start: 2022-04-27 — End: 2022-04-27

## 2022-04-27 MED ORDER — SUGAMMADEX SODIUM 200 MG/2ML IV SOLN
INTRAVENOUS | Status: DC | PRN
  Administered 2022-04-27: 200 mg via INTRAVENOUS

## 2022-04-27 MED ORDER — SODIUM CHLORIDE 0.9 % IV SOLN: INTRAVENOUS | Status: DC

## 2022-04-27 MED ORDER — FENTANYL CITRATE (PF) 100 MCG/2ML IJ SOLN
INTRAMUSCULAR | Status: DC | PRN
Start: 2022-04-27 — End: 2022-04-27
  Administered 2022-04-27 (×3): 50 ug via INTRAVENOUS

## 2022-04-27 MED ORDER — GABAPENTIN 300 MG PO CAPS
ORAL_CAPSULE | ORAL | Status: AC
  Administered 2022-04-27: 300 mg via ORAL
  Filled 2022-04-27: qty 1

## 2022-04-27 MED ORDER — SODIUM CHLORIDE 0.9 % IR SOLN
Status: DC | PRN
  Administered 2022-04-27: 1500 mL

## 2022-04-27 MED ORDER — INDOCYANINE GREEN 25 MG IV SOLR
2.5000 mg | Freq: Once | INTRAVENOUS | Status: AC
  Administered 2022-04-27: 2.5 mg via INTRAVENOUS
  Filled 2022-04-27: qty 1

## 2022-04-27 MED ORDER — CELECOXIB 200 MG PO CAPS
ORAL_CAPSULE | ORAL | Status: AC
  Administered 2022-04-27: 200 mg via ORAL
  Filled 2022-04-27: qty 1

## 2022-04-27 MED ORDER — BUPIVACAINE LIPOSOME 1.3 % IJ SUSP
INTRAMUSCULAR | Status: DC | PRN
  Administered 2022-04-27: 50 mL

## 2022-04-27 MED ORDER — PROPOFOL 10 MG/ML IV BOLUS
INTRAVENOUS | Status: DC | PRN
  Administered 2022-04-27: 150 mg via INTRAVENOUS

## 2022-04-27 MED ORDER — LIDOCAINE HCL (CARDIAC) PF 100 MG/5ML IV SOSY
PREFILLED_SYRINGE | INTRAVENOUS | Status: DC | PRN
  Administered 2022-04-27: 50 mg via INTRAVENOUS

## 2022-04-27 MED ORDER — ACETAMINOPHEN 10 MG/ML IV SOLN: 1000.0000 mg | Freq: Once | INTRAVENOUS | Status: DC | PRN

## 2022-04-27 MED ORDER — MIDAZOLAM HCL 2 MG/2ML IJ SOLN
INTRAMUSCULAR | Status: DC | PRN
  Administered 2022-04-27: 2 mg via INTRAVENOUS

## 2022-04-27 MED ORDER — DEXMEDETOMIDINE (PRECEDEX) IN NS 20 MCG/5ML (4 MCG/ML) IV SYRINGE
PREFILLED_SYRINGE | INTRAVENOUS | Status: DC | PRN
  Administered 2022-04-27: 4 ug via INTRAVENOUS
  Administered 2022-04-27 (×2): 8 ug via INTRAVENOUS

## 2022-04-27 MED ORDER — PROPOFOL 10 MG/ML IV BOLUS
INTRAVENOUS | Status: AC
  Filled 2022-04-27: qty 20

## 2022-04-27 MED ORDER — INSULIN ASPART 100 UNIT/ML IJ SOLN
4.0000 [IU] | Freq: Once | INTRAMUSCULAR | Status: AC
Start: 2022-04-27 — End: 2022-04-27
  Administered 2022-04-27: 4 [IU] via SUBCUTANEOUS

## 2022-04-27 MED ORDER — ORAL CARE MOUTH RINSE: 15.0000 mL | Freq: Once | OROMUCOSAL | Status: AC

## 2022-04-27 MED ORDER — CEFAZOLIN SODIUM-DEXTROSE 2-4 GM/100ML-% IV SOLN
INTRAVENOUS | Status: AC
  Filled 2022-04-27: qty 100

## 2022-04-27 MED ORDER — OXYCODONE HCL 5 MG/5ML PO SOLN: 5.0000 mg | Freq: Once | ORAL | Status: AC | PRN

## 2022-04-27 MED ORDER — MIDAZOLAM HCL 2 MG/2ML IJ SOLN
INTRAMUSCULAR | Status: AC
  Filled 2022-04-27: qty 2

## 2022-04-27 MED ORDER — PROMETHAZINE HCL 25 MG/ML IJ SOLN: 6.2500 mg | INTRAMUSCULAR | Status: DC | PRN

## 2022-04-27 SURGICAL SUPPLY — 41 items
CANNULA REDUC XI 12-8 STAPL (CANNULA) ×1
CANNULA REDUCER 12-8 DVNC XI (CANNULA) ×1 IMPLANT
CLIP LIGATING HEMO O LOK GREEN (MISCELLANEOUS) ×1 IMPLANT
DERMABOND ADVANCED (GAUZE/BANDAGES/DRESSINGS) ×1
DERMABOND ADVANCED .7 DNX12 (GAUZE/BANDAGES/DRESSINGS) ×1 IMPLANT
DRAPE ARM DVNC X/XI (DISPOSABLE) ×4 IMPLANT
DRAPE COLUMN DVNC XI (DISPOSABLE) ×1 IMPLANT
DRAPE DA VINCI XI ARM (DISPOSABLE) ×4
DRAPE DA VINCI XI COLUMN (DISPOSABLE) ×1
ELECT CAUTERY BLADE 6.4 (BLADE) ×1 IMPLANT
ELECT REM PT RETURN 9FT ADLT (ELECTROSURGICAL) ×1
ELECTRODE REM PT RTRN 9FT ADLT (ELECTROSURGICAL) ×1 IMPLANT
GLOVE BIO SURGEON STRL SZ7 (GLOVE) ×2 IMPLANT
GOWN STRL REUS W/ TWL LRG LVL3 (GOWN DISPOSABLE) ×4 IMPLANT
GOWN STRL REUS W/TWL LRG LVL3 (GOWN DISPOSABLE) ×3
IRRIGATION STRYKERFLOW (MISCELLANEOUS) IMPLANT
IRRIGATOR STRYKERFLOW (MISCELLANEOUS) ×1
KIT PINK PAD W/HEAD ARE REST (MISCELLANEOUS) ×1
KIT PINK PAD W/HEAD ARM REST (MISCELLANEOUS) ×1 IMPLANT
LABEL OR SOLS (LABEL) ×1 IMPLANT
MANIFOLD NEPTUNE II (INSTRUMENTS) ×1 IMPLANT
NEEDLE HYPO 22GX1.5 SAFETY (NEEDLE) ×1 IMPLANT
NS IRRIG 500ML POUR BTL (IV SOLUTION) ×1 IMPLANT
OBTURATOR OPTICAL STANDARD 8MM (TROCAR) ×1
OBTURATOR OPTICAL STND 8 DVNC (TROCAR) ×1
OBTURATOR OPTICALSTD 8 DVNC (TROCAR) ×1 IMPLANT
PACK LAP CHOLECYSTECTOMY (MISCELLANEOUS) ×1 IMPLANT
PENCIL SMOKE EVACUATOR (MISCELLANEOUS) ×1 IMPLANT
SEAL CANN UNIV 5-8 DVNC XI (MISCELLANEOUS) ×3 IMPLANT
SEAL XI 5MM-8MM UNIVERSAL (MISCELLANEOUS) ×3
SET TUBE SMOKE EVAC HIGH FLOW (TUBING) ×1 IMPLANT
SOLUTION ELECTROLUBE (MISCELLANEOUS) ×1 IMPLANT
SPONGE T-LAP 18X18 ~~LOC~~+RFID (SPONGE) ×1 IMPLANT
STAPLER CANNULA SEAL DVNC XI (STAPLE) ×1 IMPLANT
STAPLER CANNULA SEAL XI (STAPLE) ×1
SUT MNCRL AB 4-0 PS2 18 (SUTURE) ×1 IMPLANT
SUT VICRYL 0 AB UR-6 (SUTURE) ×2 IMPLANT
SYR 20ML LL LF (SYRINGE) ×1 IMPLANT
SYS BAG RETRIEVAL 10MM (BASKET) ×1
SYSTEM BAG RETRIEVAL 10MM (BASKET) ×1 IMPLANT
WATER STERILE IRR 3000ML UROMA (IV SOLUTION) IMPLANT

## 2022-04-27 NOTE — Op Note (Addendum)
Robotic assisted laparoscopic Cholecystectomy  Pre-operative Diagnosis: biliary colic  Post-operative Diagnosis: same  Procedure:  Robotic assisted laparoscopic Cholecystectomy  Surgeon: Caroleen Hamman, MD FACS  Anesthesia: Gen. with endotracheal tube  Findings: Chronic calculus Cholecystitis  Hepatic steatosis w Large liver   Estimated Blood Loss: 5cc       Specimens: Gallbladder           Complications: none   Procedure Details  The patient was seen again in the Holding Room. The benefits, complications, treatment options, and expected outcomes were discussed with the patient. The risks of bleeding, infection, recurrence of symptoms, failure to resolve symptoms, bile duct damage, bile duct leak, retained common bile duct stone, bowel injury, any of which could require further surgery and/or ERCP, stent, or papillotomy were reviewed with the patient. The likelihood of improving the patient's symptoms with return to their baseline status is good.  The patient and/or family concurred with the proposed plan, giving informed consent.  The patient was taken to Operating Room, identified  and the procedure verified as Laparoscopic Cholecystectomy.  A Time Out was held and the above information confirmed.  Prior to the induction of general anesthesia, antibiotic prophylaxis was administered. VTE prophylaxis was in place. General endotracheal anesthesia was then administered and tolerated well. After the induction, the abdomen was prepped with Chloraprep and draped in the sterile fashion. The patient was positioned in the supine position.  Cut down technique was used to enter the abdominal cavity and a Hasson trochar was placed after two vicryl stitches were anchored to the fascia. Pneumoperitoneum was then created with CO2 and tolerated well without any adverse changes in the patient's vital signs.  Three 8-mm ports were placed under direct vision. All skin incisions  were infiltrated with a  local anesthetic agent before making the incision and placing the trocars.   The patient was positioned  in reverse Trendelenburg, robot was brought to the surgical field and docked in the standard fashion.  We made sure all the instrumentation was kept indirect view at all times and that there were no collision between the arms. I scrubbed out and went to the console.  The gallbladder was identified, the fundus grasped and retracted cephalad. Adhesions were lysed bluntly. The infundibulum was grasped and retracted laterally, exposing the peritoneum overlying the triangle of Calot. This was then divided and exposed in a blunt fashion. An extended critical view of the cystic duct and cystic artery was obtained.  The cystic duct was clearly identified and bluntly dissected.   Artery and duct were double clipped and divided. Using ICG cholangiography we visualize the cystic duct and CBD, no evidence of bile injuries. The gallbladder was taken from the gallbladder fossa in a retrograde fashion with the electrocautery.  Hemostasis was achieved with the electrocautery. nspection of the right upper quadrant was performed. No bleeding, bile duct injury or leak, or bowel injury was noted. Robotic instruments and robotic arms were undocked in the standard fashion.  I scrubbed back in.  The gallbladder was removed and placed in an Endocatch bag.   Pneumoperitoneum was released.  The periumbilical port site was closed with interrumpted 0 Vicryl sutures. 4-0 subcuticular Monocryl was used to close the skin. Dermabond was  applied.  The patient was then extubated and brought to the recovery room in stable condition. Sponge, lap, and needle counts were correct at closure and at the conclusion of the case.  Caroleen Hamman, MD, FACS

## 2022-04-27 NOTE — Interval H&P Note (Signed)
History and Physical Interval Note:  04/27/2022 7:19 AM  Cheryl Wallace  has presented today for surgery, with the diagnosis of Biliary colic T80.69.  The various methods of treatment have been discussed with the patient and family. After consideration of risks, benefits and other options for treatment, the patient has consented to  Procedure(s): XI ROBOTIC ASSISTED LAPAROSCOPIC CHOLECYSTECTOMY (N/A) [place] (ICG) (N/A) as a surgical intervention.  The patient's history has been reviewed, patient examined, no change in status, stable for surgery.  I have reviewed the patient's chart and labs.  Questions were answered to the patient's satisfaction.     [place]

## 2022-04-27 NOTE — Anesthesia Postprocedure Evaluation (Signed)
Anesthesia Post Note  Patient: Cheryl Wallace  Procedure(s) Performed: XI ROBOTIC ASSISTED LAPAROSCOPIC CHOLECYSTECTOMY (Abdomen) INDOCYANINE GREEN FLUORESCENCE IMAGING (ICG) (Abdomen)  Patient location during evaluation: PACU Anesthesia Type: General Level of consciousness: awake and alert Pain management: pain level controlled Vital Signs Assessment: post-procedure vital signs reviewed and stable Respiratory status: spontaneous breathing, nonlabored ventilation and respiratory function stable Cardiovascular status: blood pressure returned to baseline and stable Postop Assessment: no apparent nausea or vomiting Anesthetic complications: no   No notable events documented.   Last Vitals:  Vitals:   04/27/22 0945 04/27/22 1015  BP: (!) 165/69 (!) 147/75  Pulse: 77 81  Resp: 19 20  Temp: 36.6 C 37.1 C  SpO2: 93% 94%    Last Pain:  Vitals:   04/27/22 1015  TempSrc: Temporal  PainSc: 0-No pain                 [place]

## 2022-04-27 NOTE — Anesthesia Preprocedure Evaluation (Addendum)
Anesthesia Evaluation  Patient identified by MRN, date of birth, ID band Patient awake    Reviewed: Allergy & Precautions, NPO status , Patient's Chart, lab work & pertinent test results  History of Anesthesia Complications Negative for: history of anesthetic complications  Airway Mallampati: III   Neck ROM: Full    Dental  (+) Poor Dentition, Missing Missing all but one tooth:   Pulmonary shortness of breath and with exertion, former smoker,    Pulmonary exam normal breath sounds clear to auscultation       Cardiovascular Exercise Tolerance: Poor hypertension, Pt. on medications and Pt. on home beta blockers  Rhythm:Regular Rate:Normal + Systolic murmurs ECG 25/4/98: normal   Neuro/Psych Seizures - (x1 at age 11),  PSYCHIATRIC DISORDERS Depression    GI/Hepatic GERD  ,Biliary colic   Endo/Other  diabetes, Well Controlled, Type 2Obesity   Renal/GU Renal disease (CKD)     Musculoskeletal  (+) Arthritis ,   Abdominal (+) + obese,   Peds  Hematology  (+) Blood dyscrasia, anemia ,   Anesthesia Other Findings   Reproductive/Obstetrics                            Anesthesia Physical  Anesthesia Plan  ASA: 3  Anesthesia Plan: General   Post-op Pain Management: Tylenol PO (pre-op)*, Celebrex PO (pre-op)* and Gabapentin PO (pre-op)*   Induction: Intravenous  PONV Risk Score and Plan: 4 or greater and Ondansetron, Dexamethasone, Droperidol and Midazolam  Airway Management Planned: Oral ETT  Additional Equipment:   Intra-op Plan:   Post-operative Plan: Extubation in OR  Informed Consent: I have reviewed the patients History and Physical, chart, labs and discussed the procedure including the risks, benefits and alternatives for the proposed anesthesia with the patient or authorized representative who has indicated his/her understanding and acceptance.       Plan Discussed with:  CRNA  Anesthesia Plan Comments:        Anesthesia Quick Evaluation

## 2022-04-27 NOTE — Transfer of Care (Signed)
Immediate Anesthesia Transfer of Care Note  Patient: Cheryl Wallace  Procedure(s) Performed: XI ROBOTIC ASSISTED LAPAROSCOPIC CHOLECYSTECTOMY (Abdomen) INDOCYANINE GREEN FLUORESCENCE IMAGING (ICG) (Abdomen)  Patient Location: PACU  Anesthesia Type:General  Level of Consciousness: awake  Airway & Oxygen Therapy: Patient Spontanous Breathing and Patient connected to face mask oxygen  Post-op Assessment: Report given to RN and Post -op Vital signs reviewed and stable  Post vital signs: Reviewed and stable  Last Vitals:  Vitals Value Taken Time  BP 151/86 04/27/22 0845  Temp    Pulse 64 04/27/22 0848  Resp 28 04/27/22 0848  SpO2 92 % 04/27/22 0848  Vitals shown include unvalidated device data.  Last Pain:  Vitals:   04/27/22 0646  TempSrc: Temporal  PainSc: 0-No pain      Patients Stated Pain Goal: 0 (97/94/80 1655)  Complications: No notable events documented.

## 2022-04-27 NOTE — Progress Notes (Signed)
Patient has been exercising IS. Reaches 1250 when performed. Denies shortness of breath, chest pain. Surgical site pain 3/10 at this moment.

## 2022-04-27 NOTE — Discharge Instructions (Addendum)
Laparoscopic Cholecystectomy, Care After  ° °These instructions give you information on caring for yourself after your procedure. Your doctor may also give you more specific instructions. Call your doctor if you have any problems or questions after your procedure.  °HOME CARE  °Change your bandages (dressings) as told by your doctor.  °Keep the wound dry and clean. Wash the wound gently with soap and water. Pat the wound dry with a clean towel.  °Do not take baths, swim, or use hot tubs for 2 weeks, or as told by your doctor.  °Only take medicine as told by your doctor.  °Eat a normal diet as told by your doctor.  °Do not lift anything heavier than 10 pounds (4.5 kg) until your doctor says it is okay.  °Do not play contact sports for 1 week, or as told by your doctor. °GET HELP IF:  °Your wound is red, puffy (swollen), or painful.  °You have yellowish-white fluid (pus) coming from the wound.  °You have fluid draining from the wound for more than 1 day.  °You have a bad smell coming from the wound.  °Your wound breaks open. °GET HELP RIGHT AWAY IF:  °You have trouble breathing.  °You have chest pain.  °You have a fever >101  °You have pain in the shoulders (shoulder strap areas) that is getting worse.  °You feel dizzy or pass out (faint).  °You have severe belly (abdominal) pain.  °You feel sick to your stomach (nauseous) or throw up (vomit) for more than 1 day. ° °AMBULATORY SURGERY  °DISCHARGE INSTRUCTIONS ° ° °The drugs that you were given will stay in your system until tomorrow so for the next 24 hours you should not: ° °Drive an automobile °Make any legal decisions °Drink any alcoholic beverage ° ° °You may resume regular meals tomorrow.  Today it is better to start with liquids and gradually work up to solid foods. ° °You may eat anything you prefer, but it is better to start with liquids, then soup and crackers, and gradually work up to solid foods. ° ° °Please notify your doctor immediately if you have any  unusual bleeding, trouble breathing, redness and pain at the surgery site, drainage, fever, or pain not relieved by medication. ° ° ° °Additional Instructions: °Please contact your physician with any problems or Same Day Surgery at [phone], Monday through Friday 6 am to 4 pm, or [place] [facility] Main number at [phone].  °  °

## 2022-04-27 NOTE — Anesthesia Procedure Notes (Signed)
Procedure Name: Intubation Date/Time: 04/27/2022 7:40 AM  Performed by: Biagio Borg, CRNAPre-anesthesia Checklist: Patient identified, Emergency Drugs available, Suction available and Patient being monitored Patient Re-evaluated:Patient Re-evaluated prior to induction Oxygen Delivery Method: Circle system utilized Preoxygenation: Pre-oxygenation with 100% oxygen Induction Type: IV induction Ventilation: Mask ventilation without difficulty Laryngoscope Size: McGraph and 3 Grade View: Grade II Tube type: Oral Tube size: 7.0 mm Number of attempts: 1 Airway Equipment and Method: Stylet Placement Confirmation: ETT inserted through vocal cords under direct vision, positive ETCO2 and breath sounds checked- equal and bilateral Secured at: 21 cm Tube secured with: Tape Dental Injury: Teeth and Oropharynx as per pre-operative assessment

## 2022-04-27 NOTE — Progress Notes (Signed)
Patient 02 sats decreased when off of 02 via nasal cannula, got patient up in chair and is more comfortable, tolerated position change well. Auscultated lungs and they are clear but diminished. No further orders given. Order given for Novolog 4 units SQ. Will administered.

## 2022-04-28 ENCOUNTER — Other Ambulatory Visit: Payer: Self-pay

## 2022-04-28 ENCOUNTER — Other Ambulatory Visit: Payer: Self-pay | Admitting: Internal Medicine

## 2022-04-28 LAB — SURGICAL PATHOLOGY

## 2022-04-28 NOTE — Patient Outreach (Signed)
[facility]) Care Management  04/28/2022  Cheryl Wallace 1950-11-04 [id number]   Telephone Screen    Outreach call to patient to introduce [facility] services and assess care needs as part of benefit of PCP office and insurance plan.Spoke with patient. She denies any RN CM needs or concerns at this time or need for follow up call.   Main healthcare issue/concern today: Patient had laparoscopic gallbladder surgery on yesterday. She is recovering at home. She has some pain but controlled with pain regimen and ice pack. Follow up appts in place. She denies any N&V. She has been able to eat some chicken salad. She voices no needs with regards to meds or transportation.Lives alone but independent with ADLs/IADLs. Patient recently retired.   Health Maintenance/Care Gaps Addressed & Education Provided:   -Last AWV: Not on file. Discussed with patient. She has PCP appt next week and will make appt with office staff to have [place], RN,BSN,CCM [place] Management Coordinator Direct Phone: [phone] Toll Free: [phone] Fax: [phone]

## 2022-04-29 ENCOUNTER — Encounter: Payer: Self-pay | Admitting: Internal Medicine

## 2022-05-03 ENCOUNTER — Ambulatory Visit: Payer: BC Managed Care – PPO | Admitting: Internal Medicine

## 2022-05-11 ENCOUNTER — Ambulatory Visit (INDEPENDENT_AMBULATORY_CARE_PROVIDER_SITE_OTHER): Payer: Medicare HMO | Admitting: Physician Assistant

## 2022-05-11 ENCOUNTER — Encounter: Payer: Self-pay | Admitting: Physician Assistant

## 2022-05-11 VITALS — BP 148/84 | HR 82 | Temp 98.1°F | Wt 223.4 lb

## 2022-05-11 DIAGNOSIS — Z09 Encounter for follow-up examination after completed treatment for conditions other than malignant neoplasm: Secondary | ICD-10-CM

## 2022-05-11 DIAGNOSIS — K8010 Calculus of gallbladder with chronic cholecystitis without obstruction: Secondary | ICD-10-CM

## 2022-05-11 DIAGNOSIS — K8020 Calculus of gallbladder without cholecystitis without obstruction: Secondary | ICD-10-CM

## 2022-05-11 NOTE — Patient Instructions (Addendum)
You can try Imodium OTC to help with diarrhea.  If you have any concerns or questions, please feel free to call our office. Follow up as needed.    GENERAL POST-OPERATIVE PATIENT INSTRUCTIONS   WOUND CARE INSTRUCTIONS:  Keep a dry clean dressing on the wound if there is drainage. The initial bandage may be removed after 24 hours.  Once the wound has quit draining you may leave it open to air.  If clothing rubs against the wound or causes irritation and the wound is not draining you may cover it with a dry dressing during the daytime.  Try to keep the wound dry and avoid ointments on the wound unless directed to do so.  If the wound becomes bright red and painful or starts to drain infected material that is not clear, please contact your physician immediately.  If the wound is mildly pink and has a thick firm ridge underneath it, this is normal, and is referred to as a healing ridge.  This will resolve over the next 4-6 weeks.  BATHING: You may shower if you have been informed of this by your surgeon. However, Please do not submerge in a tub, hot tub, or pool until incisions are completely sealed or have been told by your surgeon that you may do so.  DIET:  You may eat any foods that you can tolerate.  It is a good idea to eat a high fiber diet and take in plenty of fluids to prevent constipation.  If you do become constipated you may want to take a mild laxative or take ducolax tablets on a daily basis until your bowel habits are regular.  Constipation can be very uncomfortable, along with straining, after recent surgery.  ACTIVITY:  You are encouraged to cough and deep breath or use your incentive spirometer if you were given one, every 15-30 minutes when awake.  This will help prevent respiratory complications and low grade fevers post-operatively if you had a general anesthetic.  You may want to hug a pillow when coughing and sneezing to add additional support to the surgical area, if you had  abdominal or chest surgery, which will decrease pain during these times.  You are encouraged to walk and engage in light activity for the next two weeks.  You should not lift more than 20 pounds for 6 weeks total after surgery as it could put you at increased risk for complications.  Twenty pounds is roughly equivalent to a plastic bag of groceries. At that time- Listen to your body when lifting, if you have pain when lifting, stop and then try again in a few days. Soreness after doing exercises or activities of daily living is normal as you get back in to your normal routine.  MEDICATIONS:  Try to take narcotic medications and anti-inflammatory medications, such as tylenol, ibuprofen, naprosyn, etc., with food.  This will minimize stomach upset from the medication.  Should you develop nausea and vomiting from the pain medication, or develop a rash, please discontinue the medication and contact your physician.  You should not drive, make important decisions, or operate machinery when taking narcotic pain medication.  SUNBLOCK Use sun block to incision area over the next year if this area will be exposed to sun. This helps decrease scarring and will allow you avoid a permanent darkened area over your incision.  QUESTIONS:  Please feel free to call our office if you have any questions, and we will be glad to assist you. [phone]  Gallbladder Eating Plan High blood cholesterol, obesity, a sedentary lifestyle, an unhealthy diet, and diabetes are risk factors for developing gallstones. If you have a gallbladder condition, you may have trouble digesting fats and tolerating high fat intake. Eating a low-fat diet can help reduce your symptoms and may be helpful before and after having surgery to remove your gallbladder (cholecystectomy). Your health care provider may recommend that you work with a dietitian to help you reduce the amount of fat in your diet. What are tips for following this  plan? General guidelines Limit your fat intake to less than 30% of your total daily calories. If you eat around 1,800 calories each day, this means eating less than 60 grams (g) of fat per day. Fat is an important part of a healthy diet. Eating a low-fat diet can make it hard to maintain a healthy body weight. Ask your dietitian how much fat, calories, and other nutrients you need each day. Eat small, frequent meals throughout the day instead of three large meals. Drink at least 8-10 cups (1.9-2.4 L) of fluid a day. Drink enough fluid to keep your urine pale yellow. If you drink alcohol: Limit how much you have to: 0-1 drink a day for women who are not pregnant. 0-2 drinks a day for men. Know how much alcohol is in a drink. In the [place] one drink equals one 12 oz bottle of beer (355 mL), one 5 oz glass of wine (148 mL), or one 1 oz glass of hard liquor (44 mL). Reading food labels  Check nutrition facts on food labels for the amount of fat per serving. Choose foods with less than 3 grams of fat per serving. Shopping Choose nonfat and low-fat healthy foods. Look for the words "nonfat," "low-fat," or "fat-free." Avoid buying processed or prepackaged foods. Cooking Cook using low-fat methods, such as baking, broiling, grilling, or boiling. Cook with small amounts of healthy fats, such as olive oil, grapeseed oil, canola oil, avocado oil, or sunflower oil. What foods are recommended?  All fresh, frozen, or canned fruits and vegetables. Whole grains. Low-fat or nonfat (skim) milk and yogurt. Lean meat, skinless poultry, fish, eggs, and beans. Low-fat protein supplement powders or drinks. Spices and herbs. The items listed above may not be a complete list of foods and beverages you can eat and drink. Contact a dietitian for more information. What foods are not recommended? High-fat foods. These include baked goods, fast food, fatty cuts of meat, ice cream, french toast, sweet rolls, pizza,  cheese bread, foods covered with butter, creamy sauces, or cheese. Fried foods. These include french fries, tempura, battered fish, breaded chicken, fried breads, and sweets. Foods that cause bloating and gas. The items listed above may not be a complete list of foods that you should avoid. Contact a dietitian for more information. Summary A low-fat diet can be helpful if you have a gallbladder condition, or before and after gallbladder surgery. Limit your fat intake to less than 30% of your total daily calories. This is about 60 g of fat if you eat 1,800 calories each day. Eat small, frequent meals throughout the day instead of three large meals. This information is not intended to replace advice given to you by your health care provider. Make sure you discuss any questions you have with your health care provider. Document Revised: 08/07/2021 Document Reviewed: 08/07/2021 Elsevier Patient Education  [place].

## 2022-05-11 NOTE — Progress Notes (Signed)
[facility] POST-OP OFFICE VISIT  05/11/2022  HPI: Cheryl Wallace is a 71 y.o. female 14 days s/p robotic assisted laparoscopic cholecystectomy for biliary colic with Dr Dahlia Byes  Some abdominal soreness; only needing sporadic tylenol No fever, chills, nausea, emesis She is having diarrhea; following what sounds like soft diet; has not tried anything for this No issues reported with incisions No other complaints   Vital signs: BP (!) 148/84   Pulse 82   Temp 98.1 F (36.7 C) (Oral)   Wt 223 lb 6.4 oz (101.3 kg)   SpO2 98%   BMI 37.18 kg/m    Physical Exam: Constitutional: Well appearing female, NAD Abdomen: Soft, non-tender, non-distended, no rebound/guarding Skin: Laparoscopic incisions are healing well, no erythema or drainage   Assessment/Plan: This is a 71 y.o. female 14 days s/p robotic assisted laparoscopic cholecystectomy for biliary colic with Dr Dahlia Byes   - Imodium for diarrhea; titrate as needed; reviewed gallbladder diet recommendations  - Pain control prn  - Reviewed wound care recommendation  - Reviewed lifting restrictions; 4 weeks total  - Reviewed surgical pathology; [place]  - She can follow up on as needed basis; She understands to call with questions/concerns or if diarrhea fails to improve   -- Edison Simon, PA-C [facility] 05/11/2022, 1:27 PM M-F: 7am - 4pm

## 2022-05-20 DIAGNOSIS — I10 Essential (primary) hypertension: Secondary | ICD-10-CM | POA: Diagnosis not present

## 2022-05-20 DIAGNOSIS — E1122 Type 2 diabetes mellitus with diabetic chronic kidney disease: Secondary | ICD-10-CM | POA: Diagnosis not present

## 2022-05-20 DIAGNOSIS — R809 Proteinuria, unspecified: Secondary | ICD-10-CM | POA: Diagnosis not present

## 2022-05-20 DIAGNOSIS — N1832 Chronic kidney disease, stage 3b: Secondary | ICD-10-CM | POA: Diagnosis not present

## 2022-05-20 DIAGNOSIS — D631 Anemia in chronic kidney disease: Secondary | ICD-10-CM | POA: Diagnosis not present

## 2022-06-03 ENCOUNTER — Other Ambulatory Visit: Payer: Self-pay | Admitting: Family

## 2022-06-14 ENCOUNTER — Ambulatory Visit: Payer: BC Managed Care – PPO | Admitting: Internal Medicine

## 2022-06-17 ENCOUNTER — Other Ambulatory Visit: Payer: Self-pay | Admitting: Internal Medicine

## 2022-06-18 ENCOUNTER — Encounter: Payer: Self-pay | Admitting: Internal Medicine

## 2022-06-18 DIAGNOSIS — E1121 Type 2 diabetes mellitus with diabetic nephropathy: Secondary | ICD-10-CM

## 2022-06-21 NOTE — Telephone Encounter (Signed)
Would you like for me to refer her for pharmacy help

## 2022-06-22 ENCOUNTER — Other Ambulatory Visit: Payer: Self-pay | Admitting: Internal Medicine

## 2022-06-23 NOTE — Telephone Encounter (Signed)
Referral has been placed. 

## 2022-06-24 ENCOUNTER — Encounter: Payer: Self-pay | Admitting: Internal Medicine

## 2022-06-24 ENCOUNTER — Ambulatory Visit (INDEPENDENT_AMBULATORY_CARE_PROVIDER_SITE_OTHER): Payer: Medicare HMO | Admitting: Internal Medicine

## 2022-06-24 VITALS — BP 138/88 | HR 92 | Temp 98.2°F | Ht 65.0 in | Wt 232.2 lb

## 2022-06-24 DIAGNOSIS — E785 Hyperlipidemia, unspecified: Secondary | ICD-10-CM

## 2022-06-24 DIAGNOSIS — G8929 Other chronic pain: Secondary | ICD-10-CM

## 2022-06-24 DIAGNOSIS — E1121 Type 2 diabetes mellitus with diabetic nephropathy: Secondary | ICD-10-CM

## 2022-06-24 DIAGNOSIS — Z9049 Acquired absence of other specified parts of digestive tract: Secondary | ICD-10-CM | POA: Diagnosis not present

## 2022-06-24 DIAGNOSIS — E1169 Type 2 diabetes mellitus with other specified complication: Secondary | ICD-10-CM

## 2022-06-24 DIAGNOSIS — I10 Essential (primary) hypertension: Secondary | ICD-10-CM | POA: Diagnosis not present

## 2022-06-24 DIAGNOSIS — Z91013 Allergy to seafood: Secondary | ICD-10-CM | POA: Diagnosis not present

## 2022-06-24 DIAGNOSIS — M25511 Pain in right shoulder: Secondary | ICD-10-CM | POA: Diagnosis not present

## 2022-06-24 LAB — POCT GLYCOSYLATED HEMOGLOBIN (HGB A1C): Hemoglobin A1C: 6.5 % — AB (ref 4.0–5.6)

## 2022-06-24 MED ORDER — EPINEPHRINE 0.3 MG/0.3ML IJ SOAJ: 0.3000 mg | INTRAMUSCULAR | 1 refills | Status: AC | PRN

## 2022-06-24 MED ORDER — PREDNISONE 10 MG PO TABS: ORAL_TABLET | ORAL | 0 refills | Status: DC

## 2022-06-24 MED ORDER — SPIRONOLACTONE 25 MG PO TABS: 25.0000 mg | ORAL_TABLET | Freq: Every day | ORAL | 3 refills | Status: DC

## 2022-06-24 NOTE — Patient Instructions (Addendum)
Your a1c was 6.5   excellent!!!  Continue metformin and glipizide .  Let me know if fastings become higher than 140 or post prandials over 170  Your blood pressure is too high.  I am recommending ADDING spironolactone 25 mg daily BUT DO NOT START UNTIL YOU RETURN    AFTER ONE WEEK OF USING  THE MEDICATION,  WE NEED TO CHECK YOUR POTASSIUM AND YOUR BLOOD PRESSURE   Please get your mammogram done now that your shoulder has healed.  It was ordered in march   You can call [facility] to set up your appointment [phone]   Pharmacy referral is in process to get assistance with Jardiance, if assistance is available .  They will call you  Take the epi pen, a prednisone taper,   an antihistamine (zyrtec) and famotidine (Pepcid,  an H2 blocker ) as your emergency kit for anaphylaxis

## 2022-06-24 NOTE — Assessment & Plan Note (Signed)
She was prescribed an EPI pen,  A prednisone taper and advised to take these as well ans an antihistamine and famotidine  With her on her upcoming cruise

## 2022-06-24 NOTE — Assessment & Plan Note (Signed)
Currently well-controlled on current medications . However Cheryl Wallace has been discontinued by patient cue to dost.  She will continue metfomrin and glizpie,  [facility] pharmacy consult has been ordered to avail patient of any assistance programs for SGLT 2 inhibitors. Patient is tolerating statin therapy for CAD risk reduction and on ACE/ARB for renal protection and hypertension

## 2022-06-24 NOTE — Assessment & Plan Note (Signed)
Not at goal on maximal doses of amlodipine and ARB, and having fluid retention.  Adding spironolactone 25 mg daily TO START AFTER RETURNING FROM [place].  WILL  Need BMET one week after start

## 2022-06-24 NOTE — Assessment & Plan Note (Addendum)
S.p reverse shoulder total arthroplasty.  Has been posptoning mammograms due to  loss of ROM but now ready to resume

## 2022-06-24 NOTE — Progress Notes (Signed)
Subjective:  Patient ID: Cheryl Wallace, female    DOB: 12-19-1950  Age: 71 y.o. MRN: [id number]  CC: The primary encounter diagnosis was Type 2 diabetes mellitus with nephropathy ([place]). Diagnoses of Hyperlipidemia associated with type 2 diabetes mellitus ([place]), Shellfish allergy, Essential hypertension, Chronic right shoulder pain, and S/P cholecystectomy were also pertinent to this visit.   HPI Cheryl Wallace presents for  Chief Complaint  Patient presents with   Follow-up    1) TYPE 2 dm WITH ckd.  UNABLE TO Afford Jardiance . Last dose was Saturday   fasitngs have ranged from 91.  Post prandials 150   2) HTN:  not checking at home.  Has been in [place]:  GALLBLADDER,  SHOULDER  AND EGD   Outpatient Medications Prior to Visit  Medication Sig Dispense Refill   amLODipine (NORVASC) 10 MG tablet TAKE 1 TABLET BY MOUTH EVERYDAY AT BEDTIME (Patient taking differently: Take 10 mg by mouth daily.) 90 tablet 1   aspirin 81 MG chewable tablet Chew 81 mg by mouth in the morning.     atorvastatin (LIPITOR) 20 MG tablet TAKE 1 TABLET BY MOUTH EVERY DAY (Patient taking differently: Take 20 mg by mouth at bedtime.) 90 tablet 3   carvedilol (COREG) 3.125 MG tablet TAKE 1 TABLET BY MOUTH TWICE A DAY WITH A MEAL 180 tablet 1   cholecalciferol (VITAMIN D) 1000 UNITS tablet Take 1,000 Units by mouth daily.      diphenhydrAMINE (BENADRYL) 50 MG tablet Take 50 mg by mouth at bedtime as needed.     fexofenadine (ALLEGRA) 180 MG tablet Take 180 mg by mouth every morning.     glipiZIDE (GLUCOTROL) 10 MG tablet TAKE 1 TABLET (10 MG TOTAL) BY MOUTH TWICE A DAY BEFORE A MEAL 180 tablet 1   JARDIANCE 10 MG TABS tablet TAKE 1 TABLET BY MOUTH EVERY DAY (Patient taking differently: Take 10 mg by mouth every evening.) 90 tablet 1   metFORMIN (GLUCOPHAGE) 1000 MG tablet TAKE 1 TABLET BY MOUTH 2 TIMES DAILY WITH A MEAL. 180 tablet 1   omeprazole (PRILOSEC) 40 MG  capsule TAKE 1 CAPSULE BY MOUTH EVERY DAY (Patient taking differently: Take 20 mg by mouth 2 (two) times daily.) 90 capsule 3   tiZANidine (ZANAFLEX) 2 MG tablet TAKE 1 TABLET BY MOUTH EVERY 6 HOURS AS NEEDED FOR MUSCLE SPASMS. 360 tablet 1   valsartan (DIOVAN) 40 MG tablet TAKE 1 TABLET BY MOUTH EVERYDAY AT BEDTIME 90 tablet 3   zolpidem (AMBIEN) 5 MG tablet TAKE 1 TABLET BY MOUTH AT BEDTIME AS NEEDED FOR SLEEP 30 tablet 2   No facility-administered medications prior to visit.    Review of Systems;  Patient denies headache, fevers, malaise, unintentional weight loss, skin rash, eye pain, sinus congestion and sinus pain, sore throat, dysphagia,  hemoptysis , cough, dyspnea, wheezing, chest pain, palpitations, orthopnea, edema, abdominal pain, nausea, melena, diarrhea, constipation, flank pain, dysuria, hematuria, urinary  Frequency, nocturia, numbness, tingling, seizures,  Focal weakness, Loss of consciousness,  Tremor, insomnia, depression, anxiety, and suicidal ideation.      Objective:  BP 138/88 (BP Location: Left Arm, Patient Position: Sitting, Cuff Size: Normal)   Pulse 92   Temp 98.2 F (36.8 C) (Oral)   Ht '5\' 5"'$  (1.651 m)   Wt 232 lb 3.2 oz (105.3 kg)   SpO2 96%   BMI 38.64 kg/m   BP Readings from Last 3 Encounters:  06/24/22  138/88  05/11/22 (!) 148/84  04/27/22 (!) 147/75    Wt Readings from Last 3 Encounters:  06/24/22 232 lb 3.2 oz (105.3 kg)  05/11/22 223 lb 6.4 oz (101.3 kg)  04/27/22 212 lb 15.4 oz (96.6 kg)    General appearance: alert, cooperative and appears stated age Ears: normal TM's and external ear canals both ears Throat: lips, mucosa, and tongue normal; teeth and gums normal Neck: no adenopathy, no carotid bruit, supple, symmetrical, trachea midline and thyroid not enlarged, symmetric, no tenderness/mass/nodules Back: symmetric, no curvature. ROM normal. No CVA tenderness. Lungs: clear to auscultation bilaterally Heart: regular rate and rhythm, S1,  S2 normal, no murmur, click, rub or gallop Abdomen: soft, non-tender; bowel sounds normal; no masses,  no organomegaly Pulses: 2+ and symmetric Skin: Skin color, texture, turgor normal. No rashes or lesions Lymph nodes: Cervical, supraclavicular, and axillary nodes normal. Neuro:  awake and interactive with normal mood and affect. Higher cortical functions are normal. Speech is clear without word-finding difficulty or dysarthria. Extraocular movements are intact. Visual fields of both eyes are grossly intact. Sensation to light touch is grossly intact bilaterally of upper and lower extremities. Motor examination shows 4+/5 symmetric hand grip and upper extremity and 5/5 lower extremity strength. There is no pronation or drift. Gait is non-ataxic   Lab Results  Component Value Date   HGBA1C 6.5 (A) 06/24/2022   HGBA1C 6.9 (H) 11/20/2021   HGBA1C 6.5 (A) 07/29/2021    Lab Results  Component Value Date   CREATININE 0.85 04/17/2022   CREATININE 0.80 04/09/2022   CREATININE 0.76 11/20/2021    Lab Results  Component Value Date   WBC 8.2 04/17/2022   HGB 13.0 04/17/2022   HCT 42.2 04/17/2022   PLT 272 04/17/2022   GLUCOSE 198 (H) 04/17/2022   CHOL 109 11/20/2021   TRIG 119.0 11/20/2021   HDL 46.50 11/20/2021   LDLDIRECT 52.0 09/26/2017   LDLCALC 39 11/20/2021   ALT 22 04/17/2022   AST 21 04/17/2022   NA 141 04/17/2022   K 4.2 04/17/2022   CL 106 04/17/2022   CREATININE 0.85 04/17/2022   BUN 15 04/17/2022   CO2 28 04/17/2022   TSH 1.272 06/24/2021   HGBA1C 6.5 (A) 06/24/2022   MICROALBUR 6.1 (H) 11/20/2021    No results found.  Assessment & Plan:   Problem List Items Addressed This Visit     Chronic right shoulder pain    S.p reverse shoulder total arthroplasty.  Has been posptoning mammograms due to  loss of ROM but now ready to resume       Relevant Medications   predniSONE (DELTASONE) 10 MG tablet   Essential hypertension    Not at goal on maximal doses of  amlodipine and ARB, and having fluid retention.  Adding spironolactone 25 mg daily TO START AFTER RETURNING FROM [place].  WILL  Need BMET one week after start       Relevant Medications   spironolactone (ALDACTONE) 25 MG tablet   EPINEPHrine 0.3 mg/0.3 mL IJ SOAJ injection   Hyperlipidemia associated with type 2 diabetes mellitus (HCC)   Relevant Medications   spironolactone (ALDACTONE) 25 MG tablet   EPINEPHrine 0.3 mg/0.3 mL IJ SOAJ injection   Other Relevant Orders   Lipid Panel w/reflex Direct LDL   S/P cholecystectomy   Shellfish allergy    She was prescribed an EPI pen,  A prednisone taper and advised to take these as well ans an antihistamine and famotidine  With her  on her upcoming cruise       Type 2 diabetes mellitus with nephropathy ([place]) - Primary    Currently well-controlled on current medications . However Vania Rea has been discontinued by patient cue to dost.  She will continue metfomrin and glizpie,  [facility] pharmacy consult has been ordered to avail patient of any assistance programs for SGLT 2 inhibitors. Patient is tolerating statin therapy for CAD risk reduction and on ACE/ARB for renal protection and hypertension       Relevant Orders   POCT HgB A1C (Completed)   Comprehensive metabolic panel    I spent a total of  38 minutes with this patient in a face to face visit on the date of this encounter reviewing the last office visit with me in   March, , her recent abdominal surgery/op report August 2023, patient's diet and exercise habits, home blood pressure /blood sugar readings, last  ER visit including labs and imaging studies ,   and post visit ordering of testing and therapeutics.    Follow-up: Return in about 6 months (around 12/24/2022) for follow up diabetes.   Crecencio Mc, MD

## 2022-06-29 ENCOUNTER — Telehealth: Payer: Self-pay

## 2022-06-29 NOTE — Chronic Care Management (AMB) (Unsigned)
   Care Guide Note  06/29/2022 Name: Cheryl Wallace MRN: [id number] DOB: 06-May-1951  Referred by: Crecencio Mc, MD Reason for referral : Care Coordination (Outreach to schedule with Pharm D Catie )   Cheryl Wallace is a 71 y.o. year old female who is a primary care patient of Derrel Nip, Aris Everts, MD. TORY MCKISSACK was referred to the pharmacist for assistance related to DM.    An unsuccessful telephone outreach was attempted today to contact the patient who was referred to the pharmacy team for assistance with medication assistance. Additional attempts will be made to contact the patient.   Noreene Larsson, [place], [place] [address] Direct Dial: [phone] [email or web address]

## 2022-07-01 ENCOUNTER — Encounter: Payer: Self-pay | Admitting: Internal Medicine

## 2022-07-01 NOTE — Chronic Care Management (AMB) (Signed)
   Care Guide Note  07/01/2022 Name: Cheryl Wallace MRN: [id number] DOB: 1950-11-21  Referred by: Crecencio Mc, MD Reason for referral : Care Coordination (Outreach to schedule with Pharm D Catie )   Cheryl Wallace is a 71 y.o. year old female who is a primary care patient of Derrel Nip, Aris Everts, MD. Cheryl Wallace was referred to the pharmacist for assistance related to DM.    Successful contact was made with the patient to discuss pharmacy services including being ready for the pharmacist to call at least 5 minutes before the scheduled appointment time, to have medication bottles and any blood sugar or blood pressure readings ready for review. The patient agreed to meet with the pharmacist via with the pharmacist via telephone visit on 07/12/2022.    Noreene Larsson, [place], [place] [address] Direct Dial: [phone] [email or web address]

## 2022-07-02 ENCOUNTER — Other Ambulatory Visit: Payer: Self-pay

## 2022-07-02 MED ORDER — TIZANIDINE HCL 2 MG PO TABS: 2.0000 mg | ORAL_TABLET | Freq: Four times a day (QID) | ORAL | 1 refills | Status: DC | PRN

## 2022-07-02 MED ORDER — AMLODIPINE BESYLATE 10 MG PO TABS: 10.0000 mg | ORAL_TABLET | Freq: Every day | ORAL | 1 refills | Status: DC

## 2022-07-02 MED ORDER — METFORMIN HCL 1000 MG PO TABS: 1000.0000 mg | ORAL_TABLET | Freq: Two times a day (BID) | ORAL | 1 refills | Status: DC

## 2022-07-05 ENCOUNTER — Ambulatory Visit: Payer: BC Managed Care – PPO | Admitting: Internal Medicine

## 2022-07-05 DIAGNOSIS — Z1231 Encounter for screening mammogram for malignant neoplasm of breast: Secondary | ICD-10-CM | POA: Diagnosis not present

## 2022-07-05 LAB — HM MAMMOGRAPHY

## 2022-07-12 ENCOUNTER — Telehealth: Payer: Self-pay | Admitting: *Deleted

## 2022-07-12 ENCOUNTER — Ambulatory Visit (INDEPENDENT_AMBULATORY_CARE_PROVIDER_SITE_OTHER): Payer: Medicare HMO

## 2022-07-12 ENCOUNTER — Other Ambulatory Visit: Payer: BC Managed Care – PPO | Admitting: Pharmacist

## 2022-07-12 DIAGNOSIS — E1121 Type 2 diabetes mellitus with diabetic nephropathy: Secondary | ICD-10-CM

## 2022-07-12 DIAGNOSIS — E1169 Type 2 diabetes mellitus with other specified complication: Secondary | ICD-10-CM | POA: Diagnosis not present

## 2022-07-12 DIAGNOSIS — E785 Hyperlipidemia, unspecified: Secondary | ICD-10-CM | POA: Diagnosis not present

## 2022-07-12 DIAGNOSIS — I10 Essential (primary) hypertension: Secondary | ICD-10-CM

## 2022-07-12 LAB — LIPID PANEL W/REFLEX DIRECT LDL
Cholesterol: 94 mg/dL (ref <200)
HDL: 42 mg/dL — ABNORMAL LOW (ref >=50)
LDL Cholesterol (Calc): 23 mg/dL (calc)
Non-HDL Cholesterol (Calc): 52 mg/dL (calc) (ref <130)
Total CHOL/HDL Ratio: 2.2 (calc) (ref <5.0)
Triglycerides: 220 mg/dL — ABNORMAL HIGH (ref <150)

## 2022-07-12 MED ORDER — EMPAGLIFLOZIN 25 MG PO TABS: 25.0000 mg | ORAL_TABLET | Freq: Every day | ORAL | 1 refills | Status: DC

## 2022-07-12 MED ORDER — GLIPIZIDE 10 MG PO TABS: 5.0000 mg | ORAL_TABLET | Freq: Two times a day (BID) | ORAL | 1 refills | Status: DC

## 2022-07-12 NOTE — Patient Instructions (Addendum)
Layne,   It was great talking to you today!  Let's increase Jardiance to 25 mg daily. Decrease glipizide to 5 mg (1/2 a tablet) twice daily. Continue metformin 1000 mg twice daily.   My pharmacy technician, Rosendo Gros, will be in touch about the assistance application to the [employer].   Take care!  [name], PharmD, [facility] [phone]

## 2022-07-12 NOTE — Progress Notes (Signed)
07/12/2022 Name: Cheryl Wallace MRN: [id number] DOB: 10-10-1950  Chief Complaint  Patient presents with   Medication Management   Diabetes    Cheryl Wallace is a 71 y.o. year old female who presented for a telephone visit.   They were referred to the pharmacist by their PCP for assistance in managing diabetes and medication access.   Subjective:  Care Team: Primary Care Provider: Crecencio Mc, MD ; Next Scheduled Visit: 12/24/22  Medication Access/Adherence  Current Pharmacy:  [id number] [id number]: [phone]: [phone]  Patient reports affordability concerns with their medications: Yes  Patient reports access/transportation concerns to their pharmacy: No  Patient reports adherence concerns with their medications:  No    Appears patient is in Medicare Coverage Gap, cannot afford Jardiance   Diabetes:  Current medications: Jardiance 10 mg daily, metformin 1000 mg twice daily, glipizide 10 mg twice daily  Current glucose readings: not checking regularly  Hypertension:  Current medications: valsartan 40 mg daily, amlodipine 10 mg daily, spironolactone 25 mg daily - denies intolerability since starting this medication, reports improved swelling   Patient has a automated, upper arm home BP cuff; however, has not been checking due to concerns about how tight automatic cuffs get  Patient denies hypotensive s/sx including dizziness, lightheadedness.    Hyperlipidemia/ASCVD Risk Reduction  Current lipid lowering medications: atorvastatin 20 mg daily  Health Maintenance  Health Maintenance Due  Topic Date Due   Medicare Annual Wellness (AWV)  Never done   MAMMOGRAM  09/03/2020   COVID-19 Vaccine (6 - Pfizer risk series) 02/07/2021     Objective: Lab Results  Component Value Date   HGBA1C 6.5 (A) 06/24/2022    Lab Results  Component Value Date   CREATININE 0.85 04/17/2022   BUN  15 04/17/2022   NA 141 04/17/2022   K 4.2 04/17/2022   CL 106 04/17/2022   CO2 28 04/17/2022    Lab Results  Component Value Date   CHOL 109 11/20/2021   HDL 46.50 11/20/2021   LDLCALC 39 11/20/2021   LDLDIRECT 52.0 09/26/2017   TRIG 119.0 11/20/2021   CHOLHDL 2 11/20/2021    Medications Reviewed Today     Reviewed by HOsker Mason RPH-CPP (Pharmacist) on 07/12/22 at 159 Med List Status: <None>   Medication Order Taking? Sig Documenting Provider Last Dose Status Informant  amLODipine (NORVASC) 10 MG tablet [id number] Take 1 tablet (10 mg total) by mouth daily. TCrecencio Mc MD Taking Active   atorvastatin (LIPITOR) 20 MG tablet [id number] TAKE 1 TABLET BY MOUTH EVERY DAY  Patient taking differently: Take 20 mg by mouth at bedtime.   TCrecencio Mc MD Taking Active   carvedilol (COREG) 3.125 MG tablet [id number] TAKE 1 TABLET BY MOUTH TWICE A DAY WITH A MEAL TCrecencio Mc MD Taking Active   cholecalciferol (VITAMIN D) 1000 UNITS tablet [id number] Take 1,000 Units by mouth daily.  [provider] Taking Active Self  diphenhydrAMINE (BENADRYL) 50 MG tablet [id number] Take 50 mg by mouth at bedtime as needed.  Patient not taking: Reported on 07/12/2022   [provider] Not Taking Active Self  EPINEPHrine 0.3 mg/0.3 mL IJ SOAJ injection [id number] Inject 0.3 mg into the muscle as needed for anaphylaxis. Due to shellfish allergy  Patient not taking: Reported on 07/12/2022   TCrecencio Mc MD Not Taking  Active   fexofenadine (ALLEGRA) 180 MG tablet [id number] Yes Take 180 mg by mouth every morning. [provider] Taking Active Self  glipiZIDE (GLUCOTROL) 10 MG tablet [id number] Yes TAKE 1 TABLET (10 MG TOTAL) BY MOUTH TWICE A DAY BEFORE A MEAL Crecencio Mc, MD Taking Active   JARDIANCE 10 MG TABS tablet [id number] No TAKE 1 TABLET BY MOUTH EVERY DAY  Patient not taking: Reported on 07/12/2022   Kennyth Arnold, FNP Not Taking  Active   metFORMIN (GLUCOPHAGE) 1000 MG tablet [id number] Yes Take 1 tablet (1,000 mg total) by mouth 2 (two) times daily with a meal. Crecencio Mc, MD Taking Active   omeprazole (PRILOSEC) 40 MG capsule [id number] Yes TAKE 1 CAPSULE BY MOUTH EVERY DAY Crecencio Mc, MD Taking Active   predniSONE (DELTASONE) 10 MG tablet [id number] No 6 tablets daily for 3 days, then reduce by 1 tablet daily until gone  Patient not taking: Reported on 07/12/2022   Crecencio Mc, MD Not Taking Active   spironolactone (ALDACTONE) 25 MG tablet [id number] Yes Take 1 tablet (25 mg total) by mouth daily. Crecencio Mc, MD Taking Active   tiZANidine (ZANAFLEX) 2 MG tablet [id number] Yes Take 1 tablet (2 mg total) by mouth every 6 (six) hours as needed for muscle spasms. Crecencio Mc, MD Taking Active   valsartan (DIOVAN) 40 MG tablet [id number] Yes TAKE 1 TABLET BY MOUTH EVERYDAY AT BEDTIME Crecencio Mc, MD Taking Active   zolpidem (AMBIEN) 5 MG tablet [id number] Yes TAKE 1 TABLET BY MOUTH AT BEDTIME AS NEEDED FOR SLEEP Crecencio Mc, MD Taking Active             SDOH Interventions Today    Flowsheet Row Most Recent Value  SDOH Interventions   Financial Strain Interventions Other (Comment)  [patient assistance]       Assessment/Plan:   Diabetes: - Currently controlled but with medication access concerns. - Recommend to maximize Jardiance to 25 mg daily to start process of titrating/reducing glipizide. Discussed risk of hypoglycemia. Proactively reduce glipizide to 5 mg twice daily, Continue metformin 1000 mg twice daily - Discussed samples at clinic.  - Meets financial criteria for Jardiance patient assistance program through [place]. Will collaborate with provider, CPhT, and patient to pursue assistance.   Hypertension: - Currently controlled - Recommend to continue current regimen at this time   Hyperlipidemia/ASCVD Risk Reduction: - Currently controlled.  - Recommend to continue current  regimen at this time   Follow Up Plan: phone call in 6 weeks  Catie TJodi Mourning, PharmD, [place] [phone]

## 2022-07-12 NOTE — Progress Notes (Signed)
Patient here for nurse visit BP check per order from Dr. Derrel Nip  Patient reports compliance with prescribed BP medications: yes  Last dose of BP medication: this morning at 9 am   BP Readings from Last 3 Encounters:  07/12/22 125/70  06/24/22 138/88  05/11/22 (!) 148/84   Pulse Readings from Last 3 Encounters:  06/24/22 92  05/11/22 82  04/27/22 81    Per Dr. Derrel Nip Patient did not have any symptoms of dizziness, chest pain or lightheadedness and she is taking her medication as directed.  She was told to continue medication as directed.  Patient verbalized understanding of instructions.   Gordy Councilman, CMA

## 2022-07-12 NOTE — Telephone Encounter (Signed)
Medication Samples have been provided to the patient.  Drug name: Jardiance       Strength: 25 mg        Qty:28 tablets 4 boxes  LOT: [id number]  Exp.Date: 09/2023  Dosing instructions:  Take 1 tablet (25 mg total) by mouth daily  The patient has been instructed regarding the correct time, dose, and frequency of taking this medication, including desired effects and most common side effects.   Kerin Salen 2:59 PM 07/12/2022

## 2022-07-13 LAB — COMPREHENSIVE METABOLIC PANEL
ALT: 26 U/L (ref 0–35)
AST: 22 U/L (ref 0–37)
Albumin: 4.1 g/dL (ref 3.5–5.2)
Alkaline Phosphatase: 71 U/L (ref 39–117)
BUN: 15 mg/dL (ref 6–23)
CO2: 23 mEq/L (ref 19–32)
Calcium: 9.5 mg/dL (ref 8.4–10.5)
Chloride: 106 mEq/L (ref 96–112)
Creatinine, Ser: 0.94 mg/dL (ref 0.40–1.20)
GFR: 61.83 mL/min (ref >60.00)
Glucose, Bld: 118 mg/dL — ABNORMAL HIGH (ref 70–99)
Potassium: 4 mEq/L (ref 3.5–5.1)
Sodium: 140 mEq/L (ref 135–145)
Total Bilirubin: 0.4 mg/dL (ref 0.2–1.2)
Total Protein: 6.4 g/dL (ref 6.0–8.3)

## 2022-07-27 ENCOUNTER — Telehealth: Payer: Self-pay

## 2022-07-27 NOTE — Telephone Encounter (Signed)
Submitted application for JARDIANCE to [employer] ([employer]) for patient assistance.   Phone: [phone]  

## 2022-07-28 ENCOUNTER — Other Ambulatory Visit: Payer: Self-pay | Admitting: Internal Medicine

## 2022-07-28 NOTE — Telephone Encounter (Signed)
Refilled: 04/28/2022 Last OV: 06/24/2022 Next OV: 12/24/2022

## 2022-08-23 ENCOUNTER — Other Ambulatory Visit: Payer: BC Managed Care – PPO | Admitting: Pharmacist

## 2022-08-23 ENCOUNTER — Telehealth: Payer: Self-pay

## 2022-08-23 NOTE — Telephone Encounter (Signed)
noted 

## 2022-08-23 NOTE — Telephone Encounter (Signed)
Medication Samples have been provided to the patient.  Drug name: Jardiance       Strength: 25        Qty: 28  LOT: [id number]  Exp.Date: 05/31/24  Dosing instructions: Take 1 tab by mouth daily.    The patient has been instructed regarding the correct time, dose, and frequency of taking this medication, including desired effects and most common side effects.   Ferne Reus 1:51 PM 08/23/2022

## 2022-08-23 NOTE — Patient Instructions (Signed)
Cheryl Wallace,   Call me when you receive the letter from the Medicare Extra Help/Low Income Subsidy that we applied for together today.   Check your blood sugars twice daily:  1) Fasting, first thing in the morning before breakfast and  2) 2 hours after your largest meal.   For a goal A1c of less than 7%, goal fasting readings are less than 130 and goal 2 hour after meal readings are less than 180.    [name], PharmD, [place], [facility] [phone]

## 2022-08-23 NOTE — Progress Notes (Signed)
08/23/2022 Name: Cheryl Wallace MRN: [id number] DOB: [birth year]  Chief Complaint  Patient presents with   Medication Management   Diabetes   Hypertension    Cheryl Wallace is a 71 y.o. year old female who presented for a telephone visit.   They were referred to the pharmacist by their PCP for assistance in managing diabetes.   Patient is participating in a Managed Medicaid Plan:  Yes  Subjective:  Care Team: Primary Care Provider: Crecencio Mc, MD ; Next Scheduled Visit: 12/23/21 Nephrology: Holley Raring;  09/23/22  Medication Access/Adherence  Current Pharmacy[facility]  [id number] [place]Phone: [phone]: [phone]  Patient reports affordability concerns with their medications: Yes  Patient reports access/transportation concerns to their pharmacy: No  Patient reports adherence concerns with their medications:  No    In Coverage Gap, unable to afford Jardiance   Diabetes:  Current medications: metformin 1000 mg twice daily, Jardiance 10 mg daily, glipizide 5 mg twice daily   Current glucose readings: fastings and afternoon readings remain in the 120s  Patient denies hypoglycemic s/sx including dizziness, shakiness, sweating. Patient denies hyperglycemic symptoms including polyuria, polydipsia, polyphagia, nocturia, neuropathy, blurred vision.   Current medication access support: reports that she received a letter from [employer] that she needs to apply for [place].    Health Maintenance  Health Maintenance Due  Topic Date Due   Medicare Annual Wellness (AWV)  Never done     Objective: Lab Results  Component Value Date   HGBA1C 6.5 (A) 06/24/2022    Lab Results  Component Value Date   CREATININE 0.94 07/12/2022   BUN 15 07/12/2022   NA 140 07/12/2022   K 4.0 07/12/2022   CL 106 07/12/2022   CO2 23 07/12/2022    Lab Results  Component Value Date   CHOL 94 07/12/2022   HDL 42 (L)  07/12/2022   LDLCALC 23 07/12/2022   LDLDIRECT 52.0 09/26/2017   TRIG 220 (H) 07/12/2022   CHOLHDL 2.2 07/12/2022    Medications Reviewed Today     Reviewed by HOsker Mason RPH-CPP (Pharmacist) on 08/23/22 at 167 Med List Status: <None>   Medication Order Taking? Sig Documenting Provider Last Dose Status Informant  amLODipine (NORVASC) 10 MG tablet [id number] Take 1 tablet (10 mg total) by mouth daily. TCrecencio Mc MD Taking Active   atorvastatin (LIPITOR) 20 MG tablet [id number] TAKE 1 TABLET BY MOUTH EVERY DAY TCrecencio Mc MD Taking Active   carvedilol (COREG) 3.125 MG tablet [id number] TAKE 1 TABLET BY MOUTH TWICE A DAY WITH A MEAL TCrecencio Mc MD Taking Active   cholecalciferol (VITAMIN D) 1000 UNITS tablet [id number] Take 1,000 Units by mouth daily.  [provider] Taking Active Self  diphenhydrAMINE (BENADRYL) 50 MG tablet [id number] Take 50 mg by mouth at bedtime as needed.  Patient not taking: Reported on 07/12/2022   [provider]  Active Self  empagliflozin (JARDIANCE) 25 MG TABS tablet [id number] Take 1 tablet (25 mg total) by mouth daily. TCrecencio Mc MD Taking Active   EPINEPHrine 0.3 mg/0.3 mL IJ SOAJ injection [id number] Inject 0.3 mg into the muscle as needed for anaphylaxis. Due to shellfish allergy  Patient not taking: Reported on 07/12/2022   TCrecencio Mc MD  Active   fexofenadine ([facility] 180 MG tablet [id number] Take 180 mg by mouth  every morning. [provider] Taking Active Self  glipiZIDE (GLUCOTROL) 10 MG tablet [id number] Yes Take 0.5 tablets (5 mg total) by mouth 2 (two) times daily before a meal. Crecencio Mc, MD Taking Active   metFORMIN (GLUCOPHAGE) 1000 MG tablet [id number] Yes Take 1 tablet (1,000 mg total) by mouth 2 (two) times daily with a meal. Crecencio Mc, MD Taking Active   omeprazole (PRILOSEC) 40 MG capsule [id number] Yes TAKE 1 CAPSULE BY MOUTH EVERY DAY Crecencio Mc,  MD Taking Active   spironolactone (ALDACTONE) 25 MG tablet [id number] Yes Take 1 tablet (25 mg total) by mouth daily. Crecencio Mc, MD Taking Active   tiZANidine (ZANAFLEX) 2 MG tablet [id number] Yes Take 1 tablet (2 mg total) by mouth every 6 (six) hours as needed for muscle spasms. Crecencio Mc, MD Taking Active   valsartan (DIOVAN) 40 MG tablet [id number] Yes TAKE 1 TABLET BY MOUTH EVERYDAY AT BEDTIME Crecencio Mc, MD Taking Active   zolpidem (AMBIEN) 5 MG tablet [id number] Yes TAKE 1 TABLET BY MOUTH EVERY DAY AT BEDTIME AS NEEDED FOR SLEEP Crecencio Mc, MD Taking Active               Assessment/Plan:   Diabetes: - Currently controlled - Goal to eliminate glipizide in the future if able, but will focus on medication access today. Assisted patient in applying for Medicare Low Income Subsidy. Income will be over the cutoff. Counseled to call me when she receives the denial letter in the mail and we will collaborate to submit to [place] patient assistance.  - Continue current regimen at this time - Patient is about to run out of medication. Will collaborate with clinic on samples   Follow Up Plan: phone call in ~ 4 weeks  [name], PharmD, [facility] [phone]

## 2022-09-05 DIAGNOSIS — J111 Influenza due to unidentified influenza virus with other respiratory manifestations: Secondary | ICD-10-CM | POA: Diagnosis not present

## 2022-09-16 ENCOUNTER — Encounter: Payer: Self-pay | Admitting: Oncology

## 2022-09-16 ENCOUNTER — Other Ambulatory Visit: Payer: BC Managed Care – PPO | Admitting: Pharmacist

## 2022-09-16 NOTE — Progress Notes (Signed)
Care Coordination Call  Received call from patient. She was denied for Medicare [place] - we can now submit application for Jardiance to [facility] with that proof of denial.   Will collaborate with pharmacy technician team to mail patient her portion of application and fax provider portion to Dr. Derrel Nip.   [name], PharmD, [place], [facility] [phone]

## 2022-09-23 ENCOUNTER — Telehealth: Payer: Self-pay

## 2022-09-23 NOTE — Telephone Encounter (Signed)
Mailed new [employer] application to pt's home. Pt has LIS denial letter to attach with completed application.

## 2022-09-30 ENCOUNTER — Telehealth: Payer: Self-pay | Admitting: Internal Medicine

## 2022-09-30 NOTE — Telephone Encounter (Signed)
Left message for patient to call back and schedule Medicare Annual Wellness Visit (AWV).   Please offer to do by telephone.  Left office number and my jabber #[phone].  AWVI eligible as of 09/06/2022   Please schedule at anytime with Nurse Health Advisor.  

## 2022-10-13 ENCOUNTER — Inpatient Hospital Stay: Payer: Medicare PPO | Admitting: Oncology

## 2022-10-13 ENCOUNTER — Encounter: Payer: Self-pay | Admitting: Oncology

## 2022-10-13 ENCOUNTER — Inpatient Hospital Stay: Payer: Medicare PPO | Attending: Oncology

## 2022-10-13 VITALS — BP 159/70 | HR 87 | Temp 99.8°F | Resp 17 | Wt 221.0 lb

## 2022-10-13 DIAGNOSIS — D509 Iron deficiency anemia, unspecified: Secondary | ICD-10-CM | POA: Diagnosis not present

## 2022-10-13 DIAGNOSIS — J0100 Acute maxillary sinusitis, unspecified: Secondary | ICD-10-CM | POA: Insufficient documentation

## 2022-10-13 DIAGNOSIS — Z862 Personal history of diseases of the blood and blood-forming organs and certain disorders involving the immune mechanism: Secondary | ICD-10-CM | POA: Diagnosis present

## 2022-10-13 LAB — CBC WITH DIFFERENTIAL/PLATELET
Abs Immature Granulocytes: 0.03 10*3/uL (ref 0.00–0.07)
Basophils Absolute: 0 10*3/uL (ref 0.0–0.1)
Basophils Relative: 1 %
Eosinophils Absolute: 0.3 10*3/uL (ref 0.0–0.5)
Eosinophils Relative: 5 %
HCT: 40.3 % (ref 36.0–46.0)
Hemoglobin: 12.5 g/dL (ref 12.0–15.0)
Immature Granulocytes: 1 %
Lymphocytes Relative: 32 %
Lymphs Abs: 2.1 10*3/uL (ref 0.7–4.0)
MCH: 26.9 pg (ref 26.0–34.0)
MCHC: 31 g/dL (ref 30.0–36.0)
MCV: 86.9 fL (ref 80.0–100.0)
Monocytes Absolute: 0.4 10*3/uL (ref 0.1–1.0)
Monocytes Relative: 6 %
Neutro Abs: 3.6 10*3/uL (ref 1.7–7.7)
Neutrophils Relative %: 55 %
Platelets: 233 10*3/uL (ref 150–400)
RBC: 4.64 MIL/uL (ref 3.87–5.11)
RDW: 14.3 % (ref 11.5–15.5)
WBC: 6.4 10*3/uL (ref 4.0–10.5)
nRBC: 0 % (ref 0.0–0.2)

## 2022-10-13 LAB — IRON AND TIBC
Iron: 71 ug/dL (ref 28–170)
Saturation Ratios: 18 % (ref 10.4–31.8)
TIBC: 386 ug/dL (ref 250–450)
UIBC: 315 ug/dL

## 2022-10-13 LAB — FERRITIN: Ferritin: 45 ng/mL (ref 11–307)

## 2022-10-13 MED ORDER — DOXYCYCLINE HYCLATE 100 MG PO TABS: 100.0000 mg | ORAL_TABLET | Freq: Two times a day (BID) | ORAL | 0 refills | Status: DC
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
## 2022-10-13 NOTE — Progress Notes (Signed)
Patient here for oncology follow-up appointment, expresses concerns of frequent sinus infections

## 2022-10-18 NOTE — Telephone Encounter (Signed)
Rec'd application back from patient.   Faxed PCP page to provider.

## 2022-10-23 ENCOUNTER — Other Ambulatory Visit: Payer: Self-pay | Admitting: Internal Medicine

## 2022-10-26 ENCOUNTER — Emergency Department: Payer: Medicare PPO

## 2022-10-26 ENCOUNTER — Other Ambulatory Visit: Payer: Self-pay

## 2022-10-26 ENCOUNTER — Emergency Department
Admission: EM | Admit: 2022-10-26 | Discharge: 2022-10-26 | Disposition: A | Discharge status: Home / Self Care | Payer: Medicare PPO | Attending: Emergency Medicine | Admitting: Emergency Medicine

## 2022-10-26 ENCOUNTER — Encounter (HOSPITAL_COMMUNITY): Payer: Self-pay

## 2022-10-26 DIAGNOSIS — Y92410 Unspecified street and highway as the place of occurrence of the external cause: Secondary | ICD-10-CM | POA: Insufficient documentation

## 2022-10-26 DIAGNOSIS — S8012XA Contusion of left lower leg, initial encounter: Secondary | ICD-10-CM | POA: Diagnosis not present

## 2022-10-26 DIAGNOSIS — I700 Atherosclerosis of aorta: Secondary | ICD-10-CM | POA: Diagnosis not present

## 2022-10-26 DIAGNOSIS — M79604 Pain in right leg: Secondary | ICD-10-CM | POA: Diagnosis present

## 2022-10-26 DIAGNOSIS — S20319A Abrasion of unspecified front wall of thorax, initial encounter: Secondary | ICD-10-CM | POA: Insufficient documentation

## 2022-10-26 DIAGNOSIS — K760 Fatty (change of) liver, not elsewhere classified: Secondary | ICD-10-CM | POA: Diagnosis not present

## 2022-10-26 DIAGNOSIS — K429 Umbilical hernia without obstruction or gangrene: Secondary | ICD-10-CM | POA: Diagnosis not present

## 2022-10-26 DIAGNOSIS — K5730 Diverticulosis of large intestine without perforation or abscess without bleeding: Secondary | ICD-10-CM | POA: Diagnosis not present

## 2022-10-26 DIAGNOSIS — S0990XA Unspecified injury of head, initial encounter: Secondary | ICD-10-CM | POA: Insufficient documentation

## 2022-10-26 DIAGNOSIS — S82141A Displaced bicondylar fracture of right tibia, initial encounter for closed fracture: Secondary | ICD-10-CM | POA: Insufficient documentation

## 2022-10-26 LAB — TROPONIN I (HIGH SENSITIVITY): Troponin I (High Sensitivity): 3 ng/L (ref <18)

## 2022-10-26 LAB — CBC WITH DIFFERENTIAL/PLATELET
Abs Immature Granulocytes: 0.05 10*3/uL (ref 0.00–0.07)
Basophils Absolute: 0 10*3/uL (ref 0.0–0.1)
Basophils Relative: 0 %
Eosinophils Absolute: 0.2 10*3/uL (ref 0.0–0.5)
Eosinophils Relative: 1 %
HCT: 38.6 % (ref 36.0–46.0)
Hemoglobin: 12 g/dL (ref 12.0–15.0)
Immature Granulocytes: 0 %
Lymphocytes Relative: 17 %
Lymphs Abs: 2.3 10*3/uL (ref 0.7–4.0)
MCH: 27.3 pg (ref 26.0–34.0)
MCHC: 31.1 g/dL (ref 30.0–36.0)
MCV: 87.7 fL (ref 80.0–100.0)
Monocytes Absolute: 0.7 10*3/uL (ref 0.1–1.0)
Monocytes Relative: 5 %
Neutro Abs: 10.3 10*3/uL — ABNORMAL HIGH (ref 1.7–7.7)
Neutrophils Relative %: 77 %
Platelets: 279 10*3/uL (ref 150–400)
RBC: 4.4 MIL/uL (ref 3.87–5.11)
RDW: 14.1 % (ref 11.5–15.5)
WBC: 13.5 10*3/uL — ABNORMAL HIGH (ref 4.0–10.5)
nRBC: 0 % (ref 0.0–0.2)

## 2022-10-26 LAB — COMPREHENSIVE METABOLIC PANEL
ALT: 27 U/L (ref 0–44)
AST: 30 U/L (ref 15–41)
Albumin: 3.8 g/dL (ref 3.5–5.0)
Alkaline Phosphatase: 65 U/L (ref 38–126)
Anion gap: 15 (ref 5–15)
BUN: 21 mg/dL (ref 8–23)
CO2: 19 mmol/L — ABNORMAL LOW (ref 22–32)
Calcium: 9 mg/dL (ref 8.9–10.3)
Chloride: 103 mmol/L (ref 98–111)
Creatinine, Ser: 1.13 mg/dL — ABNORMAL HIGH (ref 0.44–1.00)
GFR, Estimated: 53 mL/min — ABNORMAL LOW (ref >60)
Glucose, Bld: 182 mg/dL — ABNORMAL HIGH (ref 70–99)
Potassium: 4.2 mmol/L (ref 3.5–5.1)
Sodium: 137 mmol/L (ref 135–145)
Total Bilirubin: 0.8 mg/dL (ref 0.3–1.2)
Total Protein: 6.7 g/dL (ref 6.5–8.1)

## 2022-10-26 LAB — LIPASE, BLOOD: Lipase: 30 U/L (ref 11–51)

## 2022-10-26 LAB — TYPE AND SCREEN
ABO/RH(D): A NEG
Antibody Screen: NEGATIVE

## 2022-10-26 MED ORDER — IOHEXOL 350 MG/ML SOLN
75.0000 mL | Freq: Once | INTRAVENOUS | Status: AC | PRN
  Administered 2022-10-26: 75 mL via INTRAVENOUS

## 2022-10-26 MED ORDER — OXYCODONE-ACETAMINOPHEN 5-325 MG PO TABS: 1.0000 | ORAL_TABLET | ORAL | 0 refills | Status: AC | PRN

## 2022-10-26 MED ORDER — FENTANYL CITRATE PF 50 MCG/ML IJ SOSY
50.0000 ug | PREFILLED_SYRINGE | Freq: Once | INTRAMUSCULAR | Status: AC
  Administered 2022-10-26: 50 ug via INTRAMUSCULAR
  Filled 2022-10-26: qty 1

## 2022-10-26 MED ORDER — ONDANSETRON HCL 4 MG/2ML IJ SOLN
4.0000 mg | Freq: Once | INTRAMUSCULAR | Status: AC
  Administered 2022-10-26: 4 mg via INTRAVENOUS
  Filled 2022-10-26: qty 2

## 2022-10-26 MED ORDER — OXYCODONE-ACETAMINOPHEN 5-325 MG PO TABS
2.0000 | ORAL_TABLET | Freq: Once | ORAL | Status: AC
  Administered 2022-10-26: 2 via ORAL
  Filled 2022-10-26: qty 2

## 2022-10-26 NOTE — Telephone Encounter (Signed)
Submitted application for [employer] to [employer]) for patient assistance.   Phone: [phone]

## 2022-10-26 NOTE — ED Notes (Signed)
Called [facility] to request transfer to Orthopedics @ 2058 spoke to [facility] waiting on call back

## 2022-10-26 NOTE — Discharge Instructions (Signed)
So you can take Percocet for pain. Please take stool softener when starting Percocet in order to avoid constipation. Please wear knee immobilizer unless you are showering. You can absolutely not bear weight on your right leg. Please make follow-up appointment with Dr. Doreatha Martin
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
## 2022-10-26 NOTE — Care Plan (Signed)
Patient discussed with EDP.  Imaging reviewed.  There is a length-stable tiblal plateau fracture.  NWB RLE Appropriately sized (err on the side of longer) knee immobilizer RLE Follow up with Dr. Marcelino Scot or Dr. Doreatha Martin this week  Georgeanna Harrison M.D. Orthopaedic Surgery [facility]
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
## 2022-10-27 ENCOUNTER — Telehealth: Payer: Self-pay

## 2022-10-27 NOTE — Transitions of Care (Post Inpatient/ED Visit) (Signed)
   10/27/2022  Name: Cheryl Wallace MRN: [id number] DOB: 02-Jul-1951  Today's TOC FU Call Status: Today's TOC FU Call Status:: Successful TOC FU Call Competed TOC FU Call Complete Date: 10/27/22  Transition Care Management Follow-up Telephone Call Date of Discharge: 10/26/22 Discharge Facility[facility]) Type of Discharge: Emergency Department Reason for ED Visit: Orthopedic Conditions Orthopedic/Injury Diagnosis: Fracture How have you been since you were released from the hospital?: Same Any questions or concerns?: No  Items Reviewed: Did you receive and understand the discharge instructions provided?: Yes Medications obtained and verified?: Yes (Medications Reviewed) Any new allergies since your discharge?: No Dietary orders reviewed?: NA Do you have support at home?: Yes People in Home: child(ren), adult Name of Support/Comfort Primary Source: [facility] and Equipment/Supplies: [place] Ordered?: No Any new equipment or medical supplies ordered?: No  Functional Questionnaire: Do you need assistance with bathing/showering or dressing?: Yes Do you need assistance with meal preparation?: Yes Do you need assistance with eating?: Yes Do you have difficulty maintaining continence: Yes Do you need assistance with getting out of bed/getting out of a chair/moving?: Yes Do you have difficulty managing or taking your medications?: Yes  Folllow up appointments reviewed: PCP Follow-up appointment confirmed?: NA (Pt does not wish to schedule f/u appointment at this time.) MD Provider Line Number:[phone] Given: No [facility] Follow-up appointment confirmed?: Yes Date of Specialist follow-up appointment?: 11/02/22 Follow-Up Specialty Provider:: Dr. Doreatha Martin - Ortho Do you need transportation to your follow-up appointment?: No Do you understand care options if your condition(s) worsen?: Yes-patient verbalized  understanding    SIGNATURE Ferne Reus, RN

## 2022-11-01 ENCOUNTER — Ambulatory Visit (INDEPENDENT_AMBULATORY_CARE_PROVIDER_SITE_OTHER): Payer: Medicare PPO

## 2022-11-01 VITALS — Ht 65.0 in | Wt 220.0 lb

## 2022-11-01 DIAGNOSIS — Z0000 Encounter for general adult medical examination without abnormal findings: Secondary | ICD-10-CM | POA: Diagnosis not present

## 2022-11-01 NOTE — Patient Instructions (Addendum)
Ms. Kovalchuk , Thank you for taking time to come for your Medicare Wellness Visit. I appreciate your ongoing commitment to your health goals. Please review the following plan we discussed and let me know if I can assist you in the future.   These are the goals we discussed:  Goals      Healthy lifestyle     Healthy diet Stay active        This is a list of the screening recommended for you and due dates:  Health Maintenance  Topic Date Due   Yearly kidney health urinalysis for diabetes  11/21/2022   Zoster (Shingles) Vaccine (1 of 2) 01/30/2023*   Complete foot exam   11/21/2022   Hemoglobin A1C  12/24/2022   Eye exam for diabetics  04/14/2023   Mammogram  07/06/2023   Yearly kidney function blood test for diabetes  10/27/2023   Medicare Annual Wellness Visit  11/02/2023   Colon Cancer Screening  02/24/2025   DTaP/Tdap/Td vaccine (3 - Td or Tdap) 01/07/2028   Pneumonia Vaccine  Completed   Flu Shot  Completed   DEXA scan (bone density measurement)  Completed   COVID-19 Vaccine  Completed   Hepatitis C Screening: USPSTF Recommendation to screen - Ages 18-79 yo.  Completed   HPV Vaccine  Aged Out  *Topic was postponed. The date shown is not the original due date.    Advanced directives: not yet completed  Conditions/risks identified: none new  Next appointment: Follow up in one year for your annual wellness visit    Preventive Care 65 Years and Older, Female Preventive care refers to lifestyle choices and visits with your health care provider that can promote health and wellness. What does preventive care include? A yearly physical exam. This is also called an annual well check. Dental exams once or twice a year. Routine eye exams. Ask your health care provider how often you should have your eyes checked. Personal lifestyle choices, including: Daily care of your teeth and gums. Regular physical activity. Eating a healthy diet. Avoiding tobacco and drug use. Limiting  alcohol use. Practicing safe sex. Taking low-dose aspirin every day. Taking vitamin and mineral supplements as recommended by your health care provider. What happens during an annual well check? The services and screenings done by your health care provider during your annual well check will depend on your age, overall health, lifestyle risk factors, and family history of disease. Counseling  Your health care provider may ask you questions about your: Alcohol use. Tobacco use. Drug use. Emotional well-being. Home and relationship well-being. Sexual activity. Eating habits. History of falls. Memory and ability to understand (cognition). Work and work Statistician. Reproductive health. Screening  You may have the following tests or measurements: Height, weight, and BMI. Blood pressure. Lipid and cholesterol levels. These may be checked every 5 years, or more frequently if you are over 83 years old. Skin check. Lung cancer screening. You may have this screening every year starting at age 40 if you have a 30-pack-year history of smoking and currently smoke or have quit within the past 15 years. Fecal occult blood test (FOBT) of the stool. You may have this test every year starting at age 3. Flexible sigmoidoscopy or colonoscopy. You may have a sigmoidoscopy every 5 years or a colonoscopy every 10 years starting at age 58. Hepatitis C blood test. Hepatitis B blood test. Sexually transmitted disease (STD) testing. Diabetes screening. This is done by checking your blood sugar (glucose) after you  have not eaten for a while (fasting). You may have this done every 1-3 years. Bone density scan. This is done to screen for osteoporosis. You may have this done starting at age 46. Mammogram. This may be done every 1-2 years. Talk to your health care provider about how often you should have regular mammograms. Talk with your health care provider about your test results, treatment options, and if  necessary, the need for more tests. Vaccines  Your health care provider may recommend certain vaccines, such as: Influenza vaccine. This is recommended every year. Tetanus, diphtheria, and acellular pertussis (Tdap, Td) vaccine. You may need a Td booster every 10 years. Zoster vaccine. You may need this after age 76. Pneumococcal 13-valent conjugate (PCV13) vaccine. One dose is recommended after age 68. Pneumococcal polysaccharide (PPSV23) vaccine. One dose is recommended after age 34. Talk to your health care provider about which screenings and vaccines you need and how often you need them. This information is not intended to replace advice given to you by your health care provider. Make sure you discuss any questions you have with your health care provider. Document Released: 09/19/2015 Document Revised: 05/12/2016 Document Reviewed: 06/24/2015 Elsevier Interactive Patient Education  2017 [place] Prevention in the Home Falls can cause injuries. They can happen to people of all ages. There are many things you can do to make your home safe and to help prevent falls. What can I do on the outside of my home? Regularly fix the edges of walkways and driveways and fix any cracks. Remove anything that might make you trip as you walk through a door, such as a raised step or threshold. Trim any bushes or trees on the path to your home. Use bright outdoor lighting. Clear any walking paths of anything that might make someone trip, such as rocks or tools. Regularly check to see if handrails are loose or broken. Make sure that both sides of any steps have handrails. Any raised decks and porches should have guardrails on the edges. Have any leaves, snow, or ice cleared regularly. Use sand or salt on walking paths during winter. Clean up any spills in your garage right away. This includes oil or grease spills. What can I do in the bathroom? Use night lights. Install grab bars by the toilet  and in the tub and shower. Do not use towel bars as grab bars. Use non-skid mats or decals in the tub or shower. If you need to sit down in the shower, use a plastic, non-slip stool. Keep the floor dry. Clean up any water that spills on the floor as soon as it happens. Remove soap buildup in the tub or shower regularly. Attach bath mats securely with double-sided non-slip rug tape. Do not have throw rugs and other things on the floor that can make you trip. What can I do in the bedroom? Use night lights. Make sure that you have a light by your bed that is easy to reach. Do not use any sheets or blankets that are too big for your bed. They should not hang down onto the floor. Have a firm chair that has side arms. You can use this for support while you get dressed. Do not have throw rugs and other things on the floor that can make you trip. What can I do in the kitchen? Clean up any spills right away. Avoid walking on wet floors. Keep items that you use a lot in easy-to-reach places. If you need  to reach something above you, use a strong step stool that has a grab bar. Keep electrical cords out of the way. Do not use floor polish or wax that makes floors slippery. If you must use wax, use non-skid floor wax. Do not have throw rugs and other things on the floor that can make you trip. What can I do with my stairs? Do not leave any items on the stairs. Make sure that there are handrails on both sides of the stairs and use them. Fix handrails that are broken or loose. Make sure that handrails are as long as the stairways. Check any carpeting to make sure that it is firmly attached to the stairs. Fix any carpet that is loose or worn. Avoid having throw rugs at the top or bottom of the stairs. If you do have throw rugs, attach them to the floor with carpet tape. Make sure that you have a light switch at the top of the stairs and the bottom of the stairs. If you do not have them, ask someone to add  them for you. What else can I do to help prevent falls? Wear shoes that: Do not have high heels. Have rubber bottoms. Are comfortable and fit you well. Are closed at the toe. Do not wear sandals. If you use a stepladder: Make sure that it is fully opened. Do not climb a closed stepladder. Make sure that both sides of the stepladder are locked into place. Ask someone to hold it for you, if possible. Clearly mark and make sure that you can see: Any grab bars or handrails. First and last steps. Where the edge of each step is. Use tools that help you move around (mobility aids) if they are needed. These include: Canes. Walkers. Scooters. Crutches. Turn on the lights when you go into a dark area. Replace any light bulbs as soon as they burn out. Set up your furniture so you have a clear path. Avoid moving your furniture around. If any of your floors are uneven, fix them. If there are any pets around you, be aware of where they are. Review your medicines with your doctor. Some medicines can make you feel dizzy. This can increase your chance of falling. Ask your doctor what other things that you can do to help prevent falls. This information is not intended to replace advice given to you by your health care provider. Make sure you discuss any questions you have with your health care provider. Document Released: 06/19/2009 Document Revised: 01/29/2016 Document Reviewed: 09/27/2014 Elsevier Interactive Patient Education  2017 [place]. Opioid Pain Medicine Management Opioids are powerful medicines that are used to treat moderate to severe pain. When used for short periods of time, they can help you to: Sleep better. Do better in physical or occupational therapy. Feel better in the first few days after an injury. Recover from surgery. Opioids should be taken with the supervision of a trained health care provider. They should be taken for the shortest period of time possible. This is  because opioids can be addictive, and the longer you take opioids, the greater your risk of addiction. This addiction can also be called opioid use disorder. What are the risks? Using opioid pain medicines for longer than 3 days increases your risk of side effects. Side effects include: Constipation. Nausea and vomiting. Breathing difficulties (respiratory depression). Drowsiness. Confusion. Opioid use disorder. Itching. Taking opioid pain medicine for a long period of time can affect your ability to do daily  tasks. It also puts you at risk for: Motor vehicle crashes. Depression. Suicide. Heart attack. Overdose, which can be life-threatening. What is a pain treatment plan? A pain treatment plan is an agreement between you and your health care provider. Pain is unique to each person, and treatments vary depending on your condition. To manage your pain, you and your health care provider need to work together. To help you do this: Discuss the goals of your treatment, including how much pain you might expect to have and how you will manage the pain. Review the risks and benefits of taking opioid medicines. Remember that a good treatment plan uses more than one approach and minimizes the chance of side effects. Be honest about the amount of medicines you take and about any drug or alcohol use. Get pain medicine prescriptions from only one health care provider. Pain can be managed with many types of alternative treatments. Ask your health care provider to refer you to one or more specialists who can help you manage pain through: Physical or occupational therapy. Counseling (cognitive behavioral therapy). Good nutrition. Biofeedback. Massage. Meditation. Non-opioid medicine. Following a gentle exercise program. How to use opioid pain medicine Taking medicine Take your pain medicine exactly as told by your health care provider. Take it only when you need it. If your pain gets less severe,  you may take less than your prescribed dose if your health care provider approves. If you are not having pain, do nottake pain medicine unless your health care provider tells you to take it. If your pain is severe, do nottry to treat it yourself by taking more pills than instructed on your prescription. Contact your health care provider for help. Write down the times when you take your pain medicine. It is easy to become confused while on pain medicine. Writing the time can help you avoid overdose. Take other over-the-counter or prescription medicines only as told by your health care provider. Keeping yourself and others safe  While you are taking opioid pain medicine: Do not drive, use machinery, or power tools. Do not sign legal documents. Do not drink alcohol. Do not take sleeping pills. Do not supervise children by yourself. Do not do activities that require climbing or being in high places. Do not go to a lake, river, ocean, spa, or swimming pool. Do not share your pain medicine with anyone. Keep pain medicine in a locked cabinet or in a secure area where pets and children cannot reach it. Stopping your use of opioids If you have been taking opioid medicine for more than a few weeks, you may need to slowly decrease (taper) how much you take until you stop completely. Tapering your use of opioids can decrease your risk of symptoms of withdrawal, such as: Pain and cramping in the abdomen. Nausea. Sweating. Sleepiness. Restlessness. Uncontrollable shaking (tremors). Cravings for the medicine. Do not attempt to taper your use of opioids on your own. Talk with your health care provider about how to do this. Your health care provider may prescribe a step-down schedule based on how much medicine you are taking and how long you have been taking it. Getting rid of leftover pills Do not save any leftover pills. Get rid of leftover pills safely by: Taking the medicine to a prescription  take-back program. This is usually offered by the county or law enforcement. Bringing them to a pharmacy that has a drug disposal container. Flushing them down the toilet. Check the label or package insert of your  medicine to see whether this is safe to do. Throwing them out in the trash. Check the label or package insert of your medicine to see whether this is safe to do. If it is safe to throw it out, remove the medicine from the original container, put it into a sealable bag or container, and mix it with used coffee grounds, food scraps, dirt, or cat litter before putting it in the trash. Follow these instructions at home: Activity Do exercises as told by your health care provider. Avoid activities that make your pain worse. Return to your normal activities as told by your health care provider. Ask your health care provider what activities are safe for you. General instructions You may need to take these actions to prevent or treat constipation: Drink enough fluid to keep your urine pale yellow. Take over-the-counter or prescription medicines. Eat foods that are high in fiber, such as beans, whole grains, and fresh fruits and vegetables. Limit foods that are high in fat and processed sugars, such as fried or sweet foods. Keep all follow-up visits. This is important. Where to find support If you have been taking opioids for a long time, you may benefit from receiving support for quitting from a local support group or counselor. Ask your health care provider for a referral to these resources in your area. Where to find more information Centers for Disease Control and Prevention (CDC): http://www.wolf.info/ [place] Food and Drug Administration (FDA): [place] Get help right away if: You may have taken too much of an opioid (overdosed). Common symptoms of an overdose: Your breathing is slower or more shallow than normal. You have a very slow heartbeat (pulse). You have slurred speech. You have nausea  and vomiting. Your pupils become very small. You have other potential symptoms: You are very confused. You faint or feel like you will faint. You have cold, clammy skin. You have blue lips or fingernails. You have thoughts of harming yourself or harming others. These symptoms may represent a serious problem that is an emergency. Do not wait to see if the symptoms will go away. Get medical help right away. Call your local emergency services (911 in the [place] Do not drive yourself to the hospital.  If you ever feel like you may hurt yourself or others, or have thoughts about taking your own life, get help right away. Go to your nearest emergency department or: Call your local emergency services (911 in the [place] Call the [facility] 646-768-3998 in the [place] Call a suicide crisis helpline, such as the [facility] at 636 399 8580 or 988 in the [place]. This is open 24 hours a day in the [place] Text the Crisis Text Line at 847-398-3010 (in the [place].). Summary Opioid medicines can help you manage moderate to severe pain for a short period of time. A pain treatment plan is an agreement between you and your health care provider. Discuss the goals of your treatment, including how much pain you might expect to have and how you will manage the pain. If you think that you or someone else may have taken too much of an opioid, get medical help right away. This information is not intended to replace advice given to you by your health care provider. Make sure you discuss any questions you have with your health care provider. Document Revised: 03/18/2021 Document Reviewed: 12/03/2020 Elsevier Patient Education  [place].
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
## 2022-11-02 ENCOUNTER — Other Ambulatory Visit: Payer: Self-pay

## 2022-11-02 ENCOUNTER — Ambulatory Visit: Payer: Self-pay | Admitting: Student

## 2022-11-02 ENCOUNTER — Encounter (HOSPITAL_COMMUNITY): Payer: Self-pay | Admitting: Student
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
## 2022-11-02 NOTE — Progress Notes (Addendum)
Spoke with pt for pre-op call. Pt denies cardiac history. Pt is treated for HTN and Diabetes. Last A1C was 6.5 on 06/24/22. Pt is not checking her blood sugar at this time. Pt instructed not to take her Jardiance, Glipizide or Metformin in the AM.    Pt states she had the flu the week of February 12th. She states she has not had any symptoms for over a week. I spoke with Dr. Fransisco Beau, Anesthesiologist and he states this type surgery can't wait so pt will be able to have it as long as she does not come in with symptoms.  Pt states she has bath wipes and will use those prior to surgery.

## 2022-11-03 ENCOUNTER — Encounter (HOSPITAL_COMMUNITY)
Admission: RE | Disposition: A | Discharge status: Skilled Nursing Facility | Payer: Self-pay | Source: Home / Self Care | Attending: Student

## 2022-11-03 ENCOUNTER — Inpatient Hospital Stay (HOSPITAL_COMMUNITY): Payer: Medicare PPO

## 2022-11-03 ENCOUNTER — Inpatient Hospital Stay (HOSPITAL_COMMUNITY): Payer: Medicare PPO | Admitting: Certified Registered"

## 2022-11-03 ENCOUNTER — Inpatient Hospital Stay (HOSPITAL_COMMUNITY)
Admission: RE | Admit: 2022-11-03 | Discharge: 2022-11-08 | DRG: 488 | Disposition: A | Discharge status: Skilled Nursing Facility | Payer: Medicare PPO | Attending: Student | Admitting: Student

## 2022-11-03 ENCOUNTER — Encounter (HOSPITAL_COMMUNITY): Payer: Self-pay | Admitting: Student

## 2022-11-03 ENCOUNTER — Other Ambulatory Visit: Payer: Self-pay

## 2022-11-03 DIAGNOSIS — S82141A Displaced bicondylar fracture of right tibia, initial encounter for closed fracture: Principal | ICD-10-CM | POA: Diagnosis present

## 2022-11-03 DIAGNOSIS — G4700 Insomnia, unspecified: Secondary | ICD-10-CM | POA: Diagnosis present

## 2022-11-03 DIAGNOSIS — K219 Gastro-esophageal reflux disease without esophagitis: Secondary | ICD-10-CM | POA: Diagnosis present

## 2022-11-03 DIAGNOSIS — Z91013 Allergy to seafood: Secondary | ICD-10-CM | POA: Diagnosis not present

## 2022-11-03 DIAGNOSIS — Z87891 Personal history of nicotine dependence: Secondary | ICD-10-CM

## 2022-11-03 DIAGNOSIS — Z7984 Long term (current) use of oral hypoglycemic drugs: Secondary | ICD-10-CM | POA: Diagnosis not present

## 2022-11-03 DIAGNOSIS — S82141D Displaced bicondylar fracture of right tibia, subsequent encounter for closed fracture with routine healing: Secondary | ICD-10-CM | POA: Diagnosis not present

## 2022-11-03 DIAGNOSIS — Z9181 History of falling: Secondary | ICD-10-CM | POA: Diagnosis not present

## 2022-11-03 DIAGNOSIS — E119 Type 2 diabetes mellitus without complications: Secondary | ICD-10-CM | POA: Diagnosis present

## 2022-11-03 DIAGNOSIS — E785 Hyperlipidemia, unspecified: Secondary | ICD-10-CM | POA: Diagnosis present

## 2022-11-03 DIAGNOSIS — Z833 Family history of diabetes mellitus: Secondary | ICD-10-CM

## 2022-11-03 DIAGNOSIS — Z9851 Tubal ligation status: Secondary | ICD-10-CM | POA: Diagnosis not present

## 2022-11-03 DIAGNOSIS — M1711 Unilateral primary osteoarthritis, right knee: Secondary | ICD-10-CM | POA: Diagnosis present

## 2022-11-03 DIAGNOSIS — Z87442 Personal history of urinary calculi: Secondary | ICD-10-CM | POA: Diagnosis not present

## 2022-11-03 DIAGNOSIS — Y92410 Unspecified street and highway as the place of occurrence of the external cause: Secondary | ICD-10-CM

## 2022-11-03 DIAGNOSIS — Z888 Allergy status to other drugs, medicaments and biological substances status: Secondary | ICD-10-CM | POA: Diagnosis not present

## 2022-11-03 DIAGNOSIS — S83261A Peripheral tear of lateral meniscus, current injury, right knee, initial encounter: Secondary | ICD-10-CM | POA: Diagnosis present

## 2022-11-03 DIAGNOSIS — Z8249 Family history of ischemic heart disease and other diseases of the circulatory system: Secondary | ICD-10-CM | POA: Diagnosis not present

## 2022-11-03 DIAGNOSIS — I10 Essential (primary) hypertension: Secondary | ICD-10-CM | POA: Diagnosis present

## 2022-11-03 DIAGNOSIS — Z880 Allergy status to penicillin: Secondary | ICD-10-CM | POA: Diagnosis not present

## 2022-11-03 DIAGNOSIS — R1312 Dysphagia, oropharyngeal phase: Secondary | ICD-10-CM | POA: Diagnosis not present

## 2022-11-03 DIAGNOSIS — Z90710 Acquired absence of both cervix and uterus: Secondary | ICD-10-CM

## 2022-11-03 DIAGNOSIS — J45909 Unspecified asthma, uncomplicated: Secondary | ICD-10-CM | POA: Diagnosis present

## 2022-11-03 DIAGNOSIS — M6281 Muscle weakness (generalized): Secondary | ICD-10-CM | POA: Diagnosis not present

## 2022-11-03 DIAGNOSIS — R2689 Other abnormalities of gait and mobility: Secondary | ICD-10-CM | POA: Diagnosis not present

## 2022-11-03 DIAGNOSIS — D62 Acute posthemorrhagic anemia: Secondary | ICD-10-CM | POA: Diagnosis not present

## 2022-11-03 DIAGNOSIS — Z90711 Acquired absence of uterus with remaining cervical stump: Secondary | ICD-10-CM

## 2022-11-03 DIAGNOSIS — M19042 Primary osteoarthritis, left hand: Secondary | ICD-10-CM | POA: Diagnosis not present

## 2022-11-03 DIAGNOSIS — R2681 Unsteadiness on feet: Secondary | ICD-10-CM | POA: Diagnosis not present

## 2022-11-03 DIAGNOSIS — R41841 Cognitive communication deficit: Secondary | ICD-10-CM | POA: Diagnosis not present

## 2022-11-03 HISTORY — PX: ORIF TIBIA PLATEAU: SHX2132

## 2022-11-03 LAB — GLUCOSE, CAPILLARY
Glucose-Capillary: 168 mg/dL — ABNORMAL HIGH (ref 70–99)
Glucose-Capillary: 147 mg/dL — ABNORMAL HIGH (ref 70–99)
Glucose-Capillary: 157 mg/dL — ABNORMAL HIGH (ref 70–99)
Glucose-Capillary: 130 mg/dL — ABNORMAL HIGH (ref 70–99)
Glucose-Capillary: 116 mg/dL — ABNORMAL HIGH (ref 70–99)

## 2022-11-03 LAB — CBC
HCT: 29.6 % — ABNORMAL LOW (ref 36.0–46.0)
Hemoglobin: 9.5 g/dL — ABNORMAL LOW (ref 12.0–15.0)
MCH: 28 pg (ref 26.0–34.0)
MCHC: 32.1 g/dL (ref 30.0–36.0)
MCV: 87.3 fL (ref 80.0–100.0)
Platelets: 372 10*3/uL (ref 150–400)
RBC: 3.39 MIL/uL — ABNORMAL LOW (ref 3.87–5.11)
RDW: 14.5 % (ref 11.5–15.5)
WBC: 11.6 10*3/uL — ABNORMAL HIGH (ref 4.0–10.5)
nRBC: 0 % (ref 0.0–0.2)

## 2022-11-03 LAB — CREATININE, SERUM
Creatinine, Ser: 0.99 mg/dL (ref 0.44–1.00)
GFR, Estimated: >60 mL/min (ref >60)

## 2022-11-03 SURGERY — OPEN REDUCTION INTERNAL FIXATION (ORIF) TIBIAL PLATEAU
Anesthesia: General | Laterality: Right

## 2022-11-03 MED ORDER — VITAMIN D 25 MCG (1000 UNIT) PO TABS
1000.0000 [IU] | ORAL_TABLET | Freq: Every day | ORAL | Status: DC
  Administered 2022-11-04 – 2022-11-08 (×5): 1000 [IU] via ORAL
  Filled 2022-11-03 (×5): qty 1

## 2022-11-03 MED ORDER — PROPOFOL 1000 MG/100ML IV EMUL
INTRAVENOUS | Status: AC
  Filled 2022-11-03: qty 100

## 2022-11-03 MED ORDER — ONDANSETRON HCL 4 MG PO TABS
4.0000 mg | ORAL_TABLET | Freq: Four times a day (QID) | ORAL | Status: DC | PRN
  Administered 2022-11-05: 4 mg via ORAL
  Filled 2022-11-03: qty 1

## 2022-11-03 MED ORDER — CHLORHEXIDINE GLUCONATE 0.12 % MT SOLN
15.0000 mL | OROMUCOSAL | Status: AC
  Administered 2022-11-03: 15 mL via OROMUCOSAL
  Filled 2022-11-03: qty 15

## 2022-11-03 MED ORDER — METFORMIN HCL 500 MG PO TABS
1000.0000 mg | ORAL_TABLET | Freq: Two times a day (BID) | ORAL | Status: DC
  Administered 2022-11-04 – 2022-11-08 (×9): 1000 mg via ORAL
  Filled 2022-11-03 (×9): qty 2

## 2022-11-03 MED ORDER — FLUTICASONE PROPIONATE 50 MCG/ACT NA SUSP: 1.0000 | Freq: Every day | NASAL | Status: DC | PRN

## 2022-11-03 MED ORDER — FENTANYL CITRATE (PF) 250 MCG/5ML IJ SOLN
INTRAMUSCULAR | Status: DC | PRN
  Administered 2022-11-03: 100 ug via INTRAVENOUS
  Administered 2022-11-03 (×3): 50 ug via INTRAVENOUS

## 2022-11-03 MED ORDER — POLYETHYLENE GLYCOL 3350 17 G PO PACK: 17.0000 g | PACK | Freq: Every day | ORAL | Status: DC | PRN

## 2022-11-03 MED ORDER — ONDANSETRON HCL 4 MG/2ML IJ SOLN: 4.0000 mg | Freq: Four times a day (QID) | INTRAMUSCULAR | Status: DC | PRN

## 2022-11-03 MED ORDER — DOCUSATE SODIUM 100 MG PO CAPS
100.0000 mg | ORAL_CAPSULE | Freq: Two times a day (BID) | ORAL | Status: DC
  Administered 2022-11-03 – 2022-11-08 (×9): 100 mg via ORAL
  Filled 2022-11-03 (×10): qty 1

## 2022-11-03 MED ORDER — FENTANYL CITRATE (PF) 250 MCG/5ML IJ SOLN
INTRAMUSCULAR | Status: AC
  Filled 2022-11-03: qty 5

## 2022-11-03 MED ORDER — EMPAGLIFLOZIN 25 MG PO TABS
25.0000 mg | ORAL_TABLET | Freq: Every day | ORAL | Status: DC
  Administered 2022-11-03 – 2022-11-08 (×6): 25 mg via ORAL
  Filled 2022-11-03 (×6): qty 1

## 2022-11-03 MED ORDER — ATORVASTATIN CALCIUM 10 MG PO TABS
20.0000 mg | ORAL_TABLET | Freq: Every day | ORAL | Status: DC
  Administered 2022-11-04 – 2022-11-08 (×5): 20 mg via ORAL
  Filled 2022-11-03 (×5): qty 2

## 2022-11-03 MED ORDER — HYDROMORPHONE HCL 1 MG/ML IJ SOLN
INTRAMUSCULAR | Status: AC
  Filled 2022-11-03: qty 1

## 2022-11-03 MED ORDER — PHENYLEPHRINE 80 MCG/ML (10ML) SYRINGE FOR IV PUSH (FOR BLOOD PRESSURE SUPPORT)
PREFILLED_SYRINGE | INTRAVENOUS | Status: AC
  Filled 2022-11-03: qty 10

## 2022-11-03 MED ORDER — MIDAZOLAM HCL 2 MG/2ML IJ SOLN
INTRAMUSCULAR | Status: DC | PRN
  Administered 2022-11-03: 2 mg via INTRAVENOUS

## 2022-11-03 MED ORDER — GLIPIZIDE 5 MG PO TABS
5.0000 mg | ORAL_TABLET | Freq: Two times a day (BID) | ORAL | Status: DC
  Administered 2022-11-03 – 2022-11-08 (×10): 5 mg via ORAL
  Filled 2022-11-03 (×10): qty 1

## 2022-11-03 MED ORDER — OXYCODONE HCL 5 MG PO TABS
10.0000 mg | ORAL_TABLET | ORAL | Status: DC | PRN
  Administered 2022-11-03 – 2022-11-04 (×2): 15 mg via ORAL
  Administered 2022-11-04: 10 mg via ORAL
  Administered 2022-11-04 – 2022-11-06 (×2): 15 mg via ORAL
  Administered 2022-11-07: 10 mg via ORAL
  Filled 2022-11-03: qty 3
  Filled 2022-11-03: qty 2
  Filled 2022-11-03 (×3): qty 3

## 2022-11-03 MED ORDER — HYDROMORPHONE HCL 1 MG/ML IJ SOLN
0.2500 mg | INTRAMUSCULAR | Status: DC | PRN
  Administered 2022-11-03 (×2): 0.5 mg via INTRAVENOUS
  Administered 2022-11-03: 0.25 mg via INTRAVENOUS
  Administered 2022-11-03: 0.5 mg via INTRAVENOUS
  Administered 2022-11-03: 0.25 mg via INTRAVENOUS

## 2022-11-03 MED ORDER — METOCLOPRAMIDE HCL 5 MG PO TABS: 5.0000 mg | ORAL_TABLET | Freq: Three times a day (TID) | ORAL | Status: DC | PRN

## 2022-11-03 MED ORDER — METHOCARBAMOL 1000 MG/10ML IJ SOLN: 500.0000 mg | Freq: Four times a day (QID) | INTRAVENOUS | Status: DC | PRN

## 2022-11-03 MED ORDER — ONDANSETRON HCL 4 MG/2ML IJ SOLN
4.0000 mg | Freq: Four times a day (QID) | INTRAMUSCULAR | Status: DC | PRN
  Filled 2022-11-03: qty 2

## 2022-11-03 MED ORDER — INSULIN ASPART 100 UNIT/ML IJ SOLN
0.0000 [IU] | Freq: Three times a day (TID) | INTRAMUSCULAR | Status: DC
  Administered 2022-11-03 – 2022-11-07 (×4): 2 [IU] via SUBCUTANEOUS

## 2022-11-03 MED ORDER — SUGAMMADEX SODIUM 200 MG/2ML IV SOLN
INTRAVENOUS | Status: DC | PRN
  Administered 2022-11-03: 200 mg via INTRAVENOUS

## 2022-11-03 MED ORDER — OXYCODONE HCL 5 MG PO TABS
5.0000 mg | ORAL_TABLET | ORAL | Status: DC | PRN
  Administered 2022-11-03 – 2022-11-07 (×4): 5 mg via ORAL
  Filled 2022-11-03: qty 1
  Filled 2022-11-03 (×2): qty 2
  Filled 2022-11-03 (×2): qty 1

## 2022-11-03 MED ORDER — CARVEDILOL 3.125 MG PO TABS
ORAL_TABLET | ORAL | Status: AC
  Administered 2022-11-03: 3.125 mg
  Filled 2022-11-03: qty 1

## 2022-11-03 MED ORDER — HYDROMORPHONE HCL 1 MG/ML IJ SOLN
0.5000 mg | INTRAMUSCULAR | Status: DC | PRN
  Administered 2022-11-03 – 2022-11-04 (×3): 1 mg via INTRAVENOUS
  Filled 2022-11-03 (×3): qty 1

## 2022-11-03 MED ORDER — OXYCODONE HCL 5 MG PO TABS
5.0000 mg | ORAL_TABLET | Freq: Once | ORAL | Status: AC | PRN
  Administered 2022-11-03: 5 mg via ORAL

## 2022-11-03 MED ORDER — LORATADINE 10 MG PO TABS
10.0000 mg | ORAL_TABLET | Freq: Every day | ORAL | Status: DC
  Administered 2022-11-04 – 2022-11-08 (×5): 10 mg via ORAL
  Filled 2022-11-03 (×4): qty 1

## 2022-11-03 MED ORDER — SUCCINYLCHOLINE CHLORIDE 200 MG/10ML IV SOSY
PREFILLED_SYRINGE | INTRAVENOUS | Status: AC
  Filled 2022-11-03: qty 10

## 2022-11-03 MED ORDER — CARVEDILOL 3.125 MG PO TABS
3.1250 mg | ORAL_TABLET | Freq: Two times a day (BID) | ORAL | Status: DC
  Administered 2022-11-03 – 2022-11-08 (×10): 3.125 mg via ORAL
  Filled 2022-11-03 (×10): qty 1

## 2022-11-03 MED ORDER — ENOXAPARIN SODIUM 40 MG/0.4ML IJ SOSY
40.0000 mg | PREFILLED_SYRINGE | INTRAMUSCULAR | Status: DC
  Administered 2022-11-04 – 2022-11-08 (×5): 40 mg via SUBCUTANEOUS
  Filled 2022-11-03 (×5): qty 0.4

## 2022-11-03 MED ORDER — PROPOFOL 10 MG/ML IV BOLUS
INTRAVENOUS | Status: DC | PRN
  Administered 2022-11-03: 130 mg via INTRAVENOUS

## 2022-11-03 MED ORDER — VANCOMYCIN HCL 1000 MG IV SOLR
INTRAVENOUS | Status: AC
  Filled 2022-11-03: qty 20

## 2022-11-03 MED ORDER — OXYCODONE HCL 5 MG PO TABS
ORAL_TABLET | ORAL | Status: AC
  Filled 2022-11-03: qty 1

## 2022-11-03 MED ORDER — CARVEDILOL 3.125 MG PO TABS
3.1250 mg | ORAL_TABLET | ORAL | Status: AC
  Administered 2022-11-03: 3.125 mg via ORAL

## 2022-11-03 MED ORDER — ZOLPIDEM TARTRATE 5 MG PO TABS
5.0000 mg | ORAL_TABLET | Freq: Every evening | ORAL | Status: DC | PRN
  Administered 2022-11-06: 5 mg via ORAL
  Filled 2022-11-03: qty 1

## 2022-11-03 MED ORDER — LACTATED RINGERS IV SOLN: INTRAVENOUS | Status: DC

## 2022-11-03 MED ORDER — DIPHENHYDRAMINE HCL 25 MG PO CAPS: 50.0000 mg | ORAL_CAPSULE | Freq: Every evening | ORAL | Status: DC | PRN

## 2022-11-03 MED ORDER — FENTANYL CITRATE (PF) 100 MCG/2ML IJ SOLN
25.0000 ug | INTRAMUSCULAR | Status: DC | PRN
  Administered 2022-11-03 (×3): 50 ug via INTRAVENOUS

## 2022-11-03 MED ORDER — PANTOPRAZOLE SODIUM 40 MG PO TBEC
80.0000 mg | DELAYED_RELEASE_TABLET | Freq: Every day | ORAL | Status: DC
  Administered 2022-11-04 – 2022-11-08 (×5): 80 mg via ORAL
  Filled 2022-11-03 (×5): qty 2

## 2022-11-03 MED ORDER — PSEUDOEPHEDRINE HCL ER 120 MG PO TB12
120.0000 mg | ORAL_TABLET | Freq: Two times a day (BID) | ORAL | Status: DC
  Administered 2022-11-04 – 2022-11-08 (×8): 120 mg via ORAL
  Filled 2022-11-03 (×10): qty 1

## 2022-11-03 MED ORDER — ACETAMINOPHEN 500 MG PO TABS
1000.0000 mg | ORAL_TABLET | Freq: Four times a day (QID) | ORAL | Status: DC
  Administered 2022-11-03 – 2022-11-08 (×17): 1000 mg via ORAL
  Filled 2022-11-03 (×21): qty 2

## 2022-11-03 MED ORDER — FENTANYL CITRATE (PF) 100 MCG/2ML IJ SOLN
INTRAMUSCULAR | Status: AC
  Filled 2022-11-03: qty 2

## 2022-11-03 MED ORDER — 0.9 % SODIUM CHLORIDE (POUR BTL) OPTIME
TOPICAL | Status: DC | PRN
  Administered 2022-11-03: 1000 mL

## 2022-11-03 MED ORDER — HYDRALAZINE HCL 10 MG PO TABS: 10.0000 mg | ORAL_TABLET | Freq: Four times a day (QID) | ORAL | Status: DC | PRN

## 2022-11-03 MED ORDER — METHOCARBAMOL 500 MG PO TABS
500.0000 mg | ORAL_TABLET | Freq: Four times a day (QID) | ORAL | Status: DC | PRN
  Administered 2022-11-03 – 2022-11-08 (×5): 500 mg via ORAL
  Filled 2022-11-03 (×5): qty 1

## 2022-11-03 MED ORDER — ROCURONIUM BROMIDE 10 MG/ML (PF) SYRINGE
PREFILLED_SYRINGE | INTRAVENOUS | Status: AC
  Filled 2022-11-03: qty 30

## 2022-11-03 MED ORDER — CEFAZOLIN SODIUM-DEXTROSE 2-4 GM/100ML-% IV SOLN
2.0000 g | INTRAVENOUS | Status: AC
  Administered 2022-11-03: 2 g via INTRAVENOUS
  Filled 2022-11-03: qty 100

## 2022-11-03 MED ORDER — MIDAZOLAM HCL 2 MG/2ML IJ SOLN
INTRAMUSCULAR | Status: AC
  Filled 2022-11-03: qty 2

## 2022-11-03 MED ORDER — OXYCODONE HCL 5 MG/5ML PO SOLN: 5.0000 mg | Freq: Once | ORAL | Status: AC | PRN

## 2022-11-03 MED ORDER — AMLODIPINE BESYLATE 10 MG PO TABS
10.0000 mg | ORAL_TABLET | Freq: Every day | ORAL | Status: DC
  Administered 2022-11-04 – 2022-11-08 (×5): 10 mg via ORAL
  Filled 2022-11-03 (×5): qty 1

## 2022-11-03 MED ORDER — EPHEDRINE 5 MG/ML INJ
INTRAVENOUS | Status: AC
  Filled 2022-11-03: qty 5

## 2022-11-03 MED ORDER — LIDOCAINE 2% (20 MG/ML) 5 ML SYRINGE
INTRAMUSCULAR | Status: AC
  Filled 2022-11-03: qty 20

## 2022-11-03 MED ORDER — VANCOMYCIN HCL 1000 MG IV SOLR
INTRAVENOUS | Status: DC | PRN
  Administered 2022-11-03: 1000 mg via TOPICAL

## 2022-11-03 MED ORDER — IRBESARTAN 75 MG PO TABS
37.5000 mg | ORAL_TABLET | Freq: Every day | ORAL | Status: DC
  Administered 2022-11-03 – 2022-11-08 (×6): 37.5 mg via ORAL
  Filled 2022-11-03 (×6): qty 0.5

## 2022-11-03 MED ORDER — ROCURONIUM BROMIDE 10 MG/ML (PF) SYRINGE
PREFILLED_SYRINGE | INTRAVENOUS | Status: DC | PRN
  Administered 2022-11-03: 60 mg via INTRAVENOUS

## 2022-11-03 MED ORDER — SPIRONOLACTONE 25 MG PO TABS
25.0000 mg | ORAL_TABLET | Freq: Every day | ORAL | Status: DC
  Administered 2022-11-04 – 2022-11-08 (×5): 25 mg via ORAL
  Filled 2022-11-03 (×5): qty 1

## 2022-11-03 MED ORDER — ONDANSETRON HCL 4 MG/2ML IJ SOLN
INTRAMUSCULAR | Status: AC
  Filled 2022-11-03: qty 2

## 2022-11-03 MED ORDER — ONDANSETRON HCL 4 MG/2ML IJ SOLN
INTRAMUSCULAR | Status: DC | PRN
  Administered 2022-11-03: 4 mg via INTRAVENOUS

## 2022-11-03 MED ORDER — METOCLOPRAMIDE HCL 5 MG/ML IJ SOLN: 5.0000 mg | Freq: Three times a day (TID) | INTRAMUSCULAR | Status: DC | PRN

## 2022-11-03 MED ORDER — LIDOCAINE 2% (20 MG/ML) 5 ML SYRINGE
INTRAMUSCULAR | Status: DC | PRN
  Administered 2022-11-03: 50 mg via INTRAVENOUS

## 2022-11-03 MED ORDER — CEFAZOLIN SODIUM-DEXTROSE 2-4 GM/100ML-% IV SOLN
2.0000 g | Freq: Three times a day (TID) | INTRAVENOUS | Status: AC
  Administered 2022-11-03 – 2022-11-04 (×3): 2 g via INTRAVENOUS
  Filled 2022-11-03 (×3): qty 100

## 2022-11-03 SURGICAL SUPPLY — 75 items
APL PRP STRL LF DISP 70% ISPRP (MISCELLANEOUS) ×2
BAG COUNTER SPONGE SURGICOUNT (BAG) ×1 IMPLANT
BAG SPNG CNTER NS LX DISP (BAG) ×1
BANDAGE ESMARK 6X9 LF (GAUZE/BANDAGES/DRESSINGS) ×1 IMPLANT
BIT DRILL CALIBR QC 2.8X250 (BIT) IMPLANT
BIT DRILL QC SFS 2.5X170 (BIT) IMPLANT
BLADE CLIPPER SURG (BLADE) IMPLANT
BLADE SURG 15 STRL LF DISP TIS (BLADE) ×1 IMPLANT
BLADE SURG 15 STRL SS (BLADE) ×1
BNDG CMPR 9X6 STRL LF SNTH (GAUZE/BANDAGES/DRESSINGS) ×1
BNDG CMPR MED 10X6 ELC LF (GAUZE/BANDAGES/DRESSINGS) ×1
BNDG ELASTIC 4X5.8 VLCR STR LF (GAUZE/BANDAGES/DRESSINGS) ×1 IMPLANT
BNDG ELASTIC 6X10 VLCR STRL LF (GAUZE/BANDAGES/DRESSINGS) IMPLANT
BNDG ELASTIC 6X5.8 VLCR STR LF (GAUZE/BANDAGES/DRESSINGS) ×1 IMPLANT
BNDG ESMARK 6X9 LF (GAUZE/BANDAGES/DRESSINGS) ×1
BNDG GAUZE DERMACEA FLUFF 4 (GAUZE/BANDAGES/DRESSINGS) ×1 IMPLANT
BNDG GZE DERMACEA 4 6PLY (GAUZE/BANDAGES/DRESSINGS)
BRUSH SCRUB EZ PLAIN DRY (MISCELLANEOUS) ×2 IMPLANT
CANISTER SUCT 3000ML PPV (MISCELLANEOUS) ×1 IMPLANT
CHLORAPREP W/TINT 26 (MISCELLANEOUS) ×2 IMPLANT
COVER SURGICAL LIGHT HANDLE (MISCELLANEOUS) ×1 IMPLANT
CUFF TOURN SGL QUICK 34 (TOURNIQUET CUFF) ×1
CUFF TRNQT CYL 34X4.125X (TOURNIQUET CUFF) ×1 IMPLANT
DRAPE C-ARM 42X72 X-RAY (DRAPES) ×1 IMPLANT
DRAPE C-ARMOR (DRAPES) ×1 IMPLANT
DRAPE ORTHO SPLIT 77X108 STRL (DRAPES) ×2
DRAPE SURG ORHT 6 SPLT 77X108 (DRAPES) ×2 IMPLANT
DRAPE U-SHAPE 47X51 STRL (DRAPES) ×1 IMPLANT
DRSG MEPILEX POST OP 4X8 (GAUZE/BANDAGES/DRESSINGS) IMPLANT
ELECT REM PT RETURN 9FT ADLT (ELECTROSURGICAL) ×1
ELECTRODE REM PT RTRN 9FT ADLT (ELECTROSURGICAL) ×1 IMPLANT
GAUZE PAD ABD 8X10 STRL (GAUZE/BANDAGES/DRESSINGS) ×2 IMPLANT
GAUZE SPONGE 4X4 12PLY STRL (GAUZE/BANDAGES/DRESSINGS) ×1 IMPLANT
GLOVE BIO SURGEON STRL SZ 6.5 (GLOVE) ×3 IMPLANT
GLOVE BIO SURGEON STRL SZ7.5 (GLOVE) ×4 IMPLANT
GLOVE BIOGEL PI IND STRL 6.5 (GLOVE) ×1 IMPLANT
GLOVE BIOGEL PI IND STRL 7.5 (GLOVE) ×1 IMPLANT
GOWN STRL REUS W/ TWL LRG LVL3 (GOWN DISPOSABLE) ×2 IMPLANT
GOWN STRL REUS W/TWL LRG LVL3 (GOWN DISPOSABLE) ×2
IMMOBILIZER KNEE 22 UNIV (SOFTGOODS) ×1 IMPLANT
KIT BASIN OR (CUSTOM PROCEDURE TRAY) ×1 IMPLANT
KIT TURNOVER KIT B (KITS) ×1 IMPLANT
NDL SUT 6 .5 CRC .975X.05 MAYO (NEEDLE) ×1 IMPLANT
NEEDLE MAYO TAPER (NEEDLE) ×1
NS IRRIG 1000ML POUR BTL (IV SOLUTION) ×1 IMPLANT
PACK TOTAL JOINT (CUSTOM PROCEDURE TRAY) ×1 IMPLANT
PAD ARMBOARD 7.5X6 YLW CONV (MISCELLANEOUS) ×2 IMPLANT
PAD CAST 4YDX4 CTTN HI CHSV (CAST SUPPLIES) ×1 IMPLANT
PADDING CAST COTTON 4X4 STRL (CAST SUPPLIES) ×1
PADDING CAST COTTON 6X4 STRL (CAST SUPPLIES) ×1 IMPLANT
PLATE LOCK TIB VA-LCP 3.5X147 (Plate) IMPLANT
SCREW CORT HEADED ST 3.5X32 (Screw) IMPLANT
SCREW HEADED ST 3.5X36 (Screw) IMPLANT
SCREW HEADED ST 3.5X80 (Screw) IMPLANT
SCREW LOCKING 3.5X70MM VA (Screw) IMPLANT
SCREW LOCKING 3.5X80MM VA (Screw) IMPLANT
SCREW LOCKING VA 3.5X75MM (Screw) IMPLANT
STAPLER VISISTAT 35W (STAPLE) ×1 IMPLANT
SUCTION FRAZIER HANDLE 10FR (MISCELLANEOUS) ×1
SUCTION TUBE FRAZIER 10FR DISP (MISCELLANEOUS) ×1 IMPLANT
SUT ETHILON 2 0 FS 18 (SUTURE) ×1 IMPLANT
SUT ETHILON 3 0 PS 1 (SUTURE) IMPLANT
SUT FIBERWIRE #2 38 T-5 BLUE (SUTURE) ×1
SUT VIC AB 0 CT1 27 (SUTURE) ×1
SUT VIC AB 0 CT1 27XBRD ANBCTR (SUTURE) IMPLANT
SUT VIC AB 1 CT1 18XCR BRD 8 (SUTURE) IMPLANT
SUT VIC AB 1 CT1 27 (SUTURE) ×1
SUT VIC AB 1 CT1 27XBRD ANBCTR (SUTURE) ×1 IMPLANT
SUT VIC AB 1 CT1 8-18 (SUTURE) ×1
SUT VIC AB 2-0 CT1 27 (SUTURE) ×2
SUT VIC AB 2-0 CT1 TAPERPNT 27 (SUTURE) ×2 IMPLANT
SUTURE FIBERWR #2 38 T-5 BLUE (SUTURE) IMPLANT
TOWEL GREEN STERILE (TOWEL DISPOSABLE) ×2 IMPLANT
TRAY FOLEY MTR SLVR 16FR STAT (SET/KITS/TRAYS/PACK) IMPLANT
WATER STERILE IRR 1000ML POUR (IV SOLUTION) ×2 IMPLANT

## 2022-11-03 NOTE — Transfer of Care (Signed)
Immediate Anesthesia Transfer of Care Note  Patient: Cheryl Wallace  Procedure(s) Performed: OPEN REDUCTION INTERNAL FIXATION (ORIF) TIBIAL PLATEAU (Right)  Patient Location: PACU  Anesthesia Type:General  Level of Consciousness: awake, alert , and oriented  Airway & Oxygen Therapy: Patient Spontanous Breathing and Patient connected to nasal cannula oxygen  Post-op Assessment: Report given to RN and Post -op Vital signs reviewed and stable  Post vital signs: Reviewed and stable  Last Vitals:  Vitals Value Taken Time  BP 128/103 11/03/22 0909  Temp    Pulse 81 11/03/22 0911  Resp 20 11/03/22 0911  SpO2 97 % 11/03/22 0911  Vitals shown include unvalidated device data.  Last Pain:  Vitals:   11/03/22 0623  TempSrc:   PainSc: 8       Patients Stated Pain Goal: 5 ([id number])  Complications: No notable events documented.

## 2022-11-03 NOTE — Anesthesia Preprocedure Evaluation (Signed)
Anesthesia Evaluation  Patient identified by MRN, date of birth, ID band Patient awake    Reviewed: Allergy & Precautions, H&P , NPO status , Patient's Chart, lab work & pertinent test results  Airway Mallampati: II   Neck ROM: full    Dental   Pulmonary former smoker   breath sounds clear to auscultation       Cardiovascular hypertension,  Rhythm:regular Rate:Normal     Neuro/Psych Seizures -,  PSYCHIATRIC DISORDERS  Depression     Neuromuscular disease    GI/Hepatic ,GERD  ,,  Endo/Other  diabetes, Type 2    Renal/GU Renal InsufficiencyRenal disease     Musculoskeletal  (+) Arthritis ,    Abdominal   Peds  Hematology   Anesthesia Other Findings   Reproductive/Obstetrics                             Anesthesia Physical Anesthesia Plan  ASA: 3  Anesthesia Plan: General   Post-op Pain Management:    Induction: Intravenous  PONV Risk Score and Plan: 3 and Ondansetron, Dexamethasone, Midazolam and Treatment may vary due to age or medical condition  Airway Management Planned: Oral ETT  Additional Equipment:   Intra-op Plan:   Post-operative Plan: Extubation in OR  Informed Consent: I have reviewed the patients History and Physical, chart, labs and discussed the procedure including the risks, benefits and alternatives for the proposed anesthesia with the patient or authorized representative who has indicated his/her understanding and acceptance.     Dental advisory given  Plan Discussed with: CRNA, Anesthesiologist and Surgeon  Anesthesia Plan Comments:        Anesthesia Quick Evaluation

## 2022-11-03 NOTE — Interval H&P Note (Signed)
History and Physical Interval Note:  11/03/2022 7:27 AM  Cheryl Wallace  has presented today for surgery, with the diagnosis of Right tibial plateau fracture.  The various methods of treatment have been discussed with the patient and family. After consideration of risks, benefits and other options for treatment, the patient has consented to  Procedure(s): OPEN REDUCTION INTERNAL FIXATION (ORIF) TIBIAL PLATEAU (Right) as a surgical intervention.  The patient's history has been reviewed, patient examined, no change in status, stable for surgery.  I have reviewed the patient's chart and labs.  Questions were answered to the patient's satisfaction.     Lennette Bihari P Alexsandra Shontz

## 2022-11-03 NOTE — Op Note (Signed)
Orthopaedic Surgery Operative Note (CSN: [id number] ) Date of Surgery: 11/03/2022  Admit Date: 11/03/2022   Diagnoses: Pre-Op Diagnoses: Right bicondylar tibial plateau fracture  Post-Op Diagnosis: Right bicondylar tibial plateau fracture Right lateral mensicus tear  Procedures: CPT 27536-Open reduction internal fixation of right bicondylar tibial plateau fracture CPT 27403-Repair of right lateral meniscus tear  Surgeons : Primary: Shona Needles, MD  Assistant: Patrecia Pace, PA-C  Location: OR 3   Anesthesia:General   Antibiotics: Ancef 2g preop with 1 gm vancomycin powder placed topically   Tourniquet time:  Total Tourniquet Time Documented: Thigh (Right) - 56 minutes Total: Thigh (Right) - 56 minutes  Estimated Blood Loss: Minimal  Complications:None   Specimens:None   Implants: Implant Name Type Inv. Item Serial No. Manufacturer Lot No. LRB No. Used Action  PLATE LOCK TIB VA-LCP 3.5X147 - [id number] Plate PLATE LOCK TIB VA-LCP 3.5X147  [employer]  Right 1 Implanted  SCREW LOCKING 3.5X70MM VA - [id number] Screw SCREW LOCKING 3.5X70MM VA  [employer]  Right 2 Implanted  SCREW LOCKING VA 3.5X75MM - [id number] Screw SCREW LOCKING VA 3.5X75MM  [employer]  Right 1 Implanted  SCREW LOCKING 3.5X80MM VA - [id number] Screw SCREW LOCKING 3.5X80MM VA  [employer]  Right 1 Implanted  SCREW HEADED ST 3.5X80 - [id number] Screw SCREW HEADED ST 3.5X80  DEPU[employer]  Right 1 Implanted  SCREW HEADED ST 3.5X36 - [id number] Screw SCREW HEADED ST 3.5X36  [employer]  Right 1 Implanted  SCREW CORT HEADED ST 3.5X32 - [id number] Screw SCREW CORT HEADED ST 3.5X32  [employer]  Right 2 Implanted     Indications for Surgery: 72 year old female who sustained a fall and a right bicondylar tibial plateau fracture with significant lateral joint impaction.  Due to the unstable nature of her injury I recommend proceeding with open reduction internal  fixation.  Risks and benefits were discussed with the patient.  Risks included but not limited to bleeding, infection, malunion, nonunion, hardware failure, hardware irritation, nerve or blood vessel injury, DVT, even the possibility anesthetic complications.  She agreed to proceed with surgery and consent was obtained.  Operative Findings: 1.  Open reduction internal fixation right bicondylar tibial plateau fracture using [employer]ynthe[employer] VA 3.5 mm proximal tibial locking plate 2.  Repair of peripheral lateral meniscus tear using #2 FiberWire in horizontal mattress fashion through the capsule.  Procedure: The patient was identified in the preoperative holding area. Consent was confirmed with the patient and their family and all questions were answered. The operative extremity was marked after confirmation with the patient. she was then brought back to the operating room by our anesthesia colleagues.  She was placed under general anesthetic and carefully transferred over to a radiolucent flattop table.  A bump was placed under her operative hip.  Nonsterile tourniquet was placed to the upper thigh.  The right lower extremity was then prepped and draped in usual sterile fashion.  A timeout was performed to verify the patient, the procedure, and the extremity.  Preoperative antibiotics were dosed.  If the knee were flexed over a triangle and fluoroscopic imaging showed the unstable nature of her injury.  An Esmarch was used to exsanguinate the leg and the tourniquet was inflated to 300 mmHg.  Total tourniquet time as noted above.  A lateral parapatellar approach was made and carried down through skin and subcutaneous tissue.  I incised through the IT band and developed the plane between the IT band and the capsule.  I then released the  IT band off of the lateral condyle and released it all the way back until I could palpate the fibular head.  Here I encountered in the split in the lateral condyle.  I performed a  submeniscal arthrotomy to visualize the articular surface.  There was a large anterior lateral peripheral meniscus tear.  I tagged the capsule for later repair back down to the plate.  I then used #2 FiberWire through the capsule and then a horizontal mattress through the meniscus and tagged these for later repair at the end of the case.  I then proceeded to enter the split in the lateral condyle and used a Cobb elevator to elevate the articular surface back up.  I confirmed adequate reduction with fluoroscopy and then held provisionally with 1.6 mm K wires.  I then reduced the condyle back down and held this provisionally with a 1.6 mm K wire.  I then chose a Synthes VA 3.5 mm proximal tibial locking plate and slid this submuscularly along the lateral cortex of the tibia.  I held it provisionally with a K wire proximally and confirmed positioning with fluoroscopic guidance.  I then placed a nonlocking 3.5 millimeter screw to bring the plate flush to bone proximally.  I then percutaneously placed 3.5 millimeter screws into the tibial shaft.  I returned to the proximal segment and proceeded to place 3.5 mm locking screws and then placed a kickstand screw to provide medial column support.  Final fluoroscopic imaging was obtained.  The incision was copiously irrigated.  The tag sutures for the capsule were brought through the plate and tied down.  I then repaired and tied the meniscus repair sutures to the capsule.  A gram of vancomycin powder was placed into the incision.  A layered closure with #1 Vicryl, 2-0 Vicryl and 3-0 nylon was used to close the skin.  Sterile dressings were applied.  The patient was then awoken from anesthesia and taken to the PACU in stable condition.  Post Op Plan/Instructions: The patient will be nonweightbearing to the right lower extremity.  She will have unrestricted range of motion to the right knee.  She will receive Lovenox for DVT prophylaxis while in the hospital and  discharged on aspirin 325 mg.  Will have her mobilize with physical and Occupational Therapy.  I was present and performed the entire surgery.  Patrecia Pace, PA-C did assist me throughout the case. An assistant was necessary given the difficulty in approach, maintenance of reduction and ability to instrument the fracture.   Katha Hamming, MD [facility]

## 2022-11-03 NOTE — Plan of Care (Signed)
  Problem: Education: Goal: Knowledge of General Education information will improve Description: Including pain rating scale, medication(s)/side effects and non-pharmacologic comfort measures Outcome: Progressing   Problem: Clinical Measurements: Goal: Will remain free from infection Outcome: Progressing   Problem: Activity: Goal: Risk for activity intolerance will decrease Outcome: Progressing   Problem: Nutrition: Goal: Adequate nutrition will be maintained Outcome: Progressing   Problem: Coping: Goal: Level of anxiety will decrease Outcome: Progressing   Problem: Elimination: Goal: Will not experience complications related to bowel motility Outcome: Progressing   Problem: Pain Managment: Goal: General experience of comfort will improve Outcome: Progressing

## 2022-11-03 NOTE — Anesthesia Procedure Notes (Signed)
Procedure Name: Intubation Date/Time: 11/03/2022 7:44 AM  Performed by: Mariea Clonts, CRNAPre-anesthesia Checklist: Patient identified, Emergency Drugs available, Suction available and Patient being monitored Patient Re-evaluated:Patient Re-evaluated prior to induction Oxygen Delivery Method: Circle System Utilized Preoxygenation: Pre-oxygenation with 100% oxygen Induction Type: IV induction Ventilation: Mask ventilation without difficulty Laryngoscope Size: Mac and 3 Grade View: Grade II Tube type: Oral Tube size: 7.0 mm Number of attempts: 1 Airway Equipment and Method: Stylet and Oral airway Placement Confirmation: ETT inserted through vocal cords under direct vision, positive ETCO2 and breath sounds checked- equal and bilateral Tube secured with: Tape Dental Injury: Teeth and Oropharynx as per pre-operative assessment

## 2022-11-04 LAB — BASIC METABOLIC PANEL
Anion gap: 6 (ref 5–15)
BUN: 17 mg/dL (ref 8–23)
CO2: 26 mmol/L (ref 22–32)
Calcium: 8.6 mg/dL — ABNORMAL LOW (ref 8.9–10.3)
Chloride: 103 mmol/L (ref 98–111)
Creatinine, Ser: 0.94 mg/dL (ref 0.44–1.00)
GFR, Estimated: >60 mL/min (ref >60)
Glucose, Bld: 109 mg/dL — ABNORMAL HIGH (ref 70–99)
Potassium: 4.3 mmol/L (ref 3.5–5.1)
Sodium: 135 mmol/L (ref 135–145)

## 2022-11-04 LAB — CBC
HCT: 28.5 % — ABNORMAL LOW (ref 36.0–46.0)
Hemoglobin: 9 g/dL — ABNORMAL LOW (ref 12.0–15.0)
MCH: 28 pg (ref 26.0–34.0)
MCHC: 31.6 g/dL (ref 30.0–36.0)
MCV: 88.8 fL (ref 80.0–100.0)
Platelets: 362 10*3/uL (ref 150–400)
RBC: 3.21 MIL/uL — ABNORMAL LOW (ref 3.87–5.11)
RDW: 14.5 % (ref 11.5–15.5)
WBC: 10.6 10*3/uL — ABNORMAL HIGH (ref 4.0–10.5)
nRBC: 0 % (ref 0.0–0.2)

## 2022-11-04 LAB — VITAMIN D 25 HYDROXY (VIT D DEFICIENCY, FRACTURES): Vit D, 25-Hydroxy: 64.66 ng/mL (ref 30–100)

## 2022-11-04 LAB — GLUCOSE, CAPILLARY
Glucose-Capillary: 125 mg/dL — ABNORMAL HIGH (ref 70–99)
Glucose-Capillary: 90 mg/dL (ref 70–99)
Glucose-Capillary: 108 mg/dL — ABNORMAL HIGH (ref 70–99)
Glucose-Capillary: 125 mg/dL — ABNORMAL HIGH (ref 70–99)

## 2022-11-04 MED ORDER — METHOCARBAMOL 500 MG PO TABS: 500.0000 mg | ORAL_TABLET | Freq: Four times a day (QID) | ORAL | Status: DC | PRN

## 2022-11-04 MED ORDER — OXYCODONE-ACETAMINOPHEN 5-325 MG PO TABS: 1.0000 | ORAL_TABLET | ORAL | 0 refills | Status: DC | PRN

## 2022-11-04 MED ORDER — ACETAMINOPHEN 325 MG PO TABS: 325.0000 mg | ORAL_TABLET | Freq: Four times a day (QID) | ORAL | Status: DC | PRN

## 2022-11-04 NOTE — TOC Initial Note (Signed)
Transition of Care [facility]) - Initial/Assessment Note    Patient Details  Name: Cheryl Wallace MRN: [id number] Date of Birth: Apr 25, 1951  Transition of Care [facility]) CM/SW Contact:    Sharin Mons, RN Phone Number: 11/04/2022, 11:42 AM  Clinical Narrative:                        - S/P Open reduction internal fixation of right bicondylar tibial plateau fracture , 11/03/2022  RNCM spoke with PT @ bedside with pt. PT recommends possible SNF placement at time of discharge. Pt lives alone.  Patient reports she is currently unable to care for self independently  at  home given her current physical needs and fall risk. Patient expressed understanding of PT recommendation and is agreeable to SNF placement at time of discharge. Patient reports  no preference for SNF . RNCM discussed insurance authorization process and provided Medicare SNF ratings list. Patient expressed being hopeful for rehab and to feel better soon. No further questions reported at this time. RNCM to continue to follow and assist with discharge planning needs.   Expected Discharge Plan: Skilled Nursing Facility Barriers to Discharge: Continued Medical Work up   Patient Goals and CMS Choice Patient states their goals for this hospitalization and ongoing recovery are:: to get better and go home   Choice offered to / list presented to : Patient      Expected Discharge Plan and Services   Discharge Planning Services: CM Consult   Living arrangements for the past 2 months: Single Family Home                                      Prior Living Arrangements/Services Living arrangements for the past 2 months: Single Family Home Lives with:: Self Patient language and need for interpreter reviewed:: Yes Do you feel safe going back to the place where you live?: Yes      Need for Family Participation in Patient Care: Yes (Comment) Care giver support system in place?: No (comment)   Criminal Activity/Legal Involvement  Pertinent to Current Situation/Hospitalization: No - Comment as needed  Activities of Daily Living Home Assistive Devices/Equipment: CBG Meter, Eyeglasses ADL Screening (condition at time of admission) Patient's cognitive ability adequate to safely complete daily activities?: Yes Is the patient deaf or have difficulty hearing?: No Does the patient have difficulty seeing, even when wearing glasses/contacts?: No Does the patient have difficulty concentrating, remembering, or making decisions?: No Patient able to express need for assistance with ADLs?: Yes Does the patient have difficulty dressing or bathing?: No Independently performs ADLs?: Yes (appropriate for developmental age) Does the patient have difficulty walking or climbing stairs?: Yes Weakness of Legs: None Weakness of Arms/Hands: None  Permission Sought/Granted                  Emotional Assessment Appearance:: Appears stated age Attitude/Demeanor/Rapport: Engaged Affect (typically observed): Accepting Orientation: : Oriented to Self, Oriented to Place, Oriented to  Time, Oriented to Situation Alcohol / Substance Use: Not Applicable Psych Involvement: No (comment)  Admission diagnosis:  Closed bicondylar fracture of right tibial plateau [S82.141A] Patient Active Problem List   Diagnosis Date Noted   Closed bicondylar fracture of right tibial plateau 11/03/2022   Shellfish allergy 06/24/2022   S/P cholecystectomy 06/24/2022   Preop examination    Hypoxia    Decreased glomerular filtration rate (GFR) 03/26/2021  Right knee injury, sequela 07/17/2020   Acromioclavicular joint arthritis 02/17/2020   Chronic right shoulder pain 02/14/2020   Post-nasal drip 04/12/2019   Educated about COVID-19 virus infection 01/09/2019   Posterior right knee pain 07/12/2018   Iron deficiency anemia 04/07/2018   Hyperlipidemia associated with type 2 diabetes mellitus ([place]) 09/04/2016   Depression, major, single episode, mild  ([place]) 09/04/2016   Osteoarthritis of both hands 05/14/2016   S/P hysterectomy with oophorectomy 10/14/2015   Insomnia 10/14/2015   Essential hypertension    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: [phone]: [phone]    Social Determinants of Health (SDOH) Social History: SDOH Screenings   Food Insecurity: No Food Insecurity (11/03/2022)  Housing: Low Risk  (11/03/2022)  Transportation Needs: No Transportation Needs (11/03/2022)  Utilities: Not At Risk (11/03/2022)  Depression (PHQ2-9): Low Risk  (11/01/2022)  Financial Resource Strain: Low Risk  (11/01/2022)  Recent Concern: Financial Resource Strain - High Risk (08/23/2022)  Physical Activity: Unknown (11/01/2022)  Social Connections: Unknown (11/01/2022)  Stress: No Stress Concern Present (11/01/2022)  Tobacco Use: Medium Risk (11/03/2022)   SDOH Interventions:     Readmission Risk Interventions     No data to display

## 2022-11-04 NOTE — Discharge Instructions (Addendum)
[facility] Discharge Instructions   General Discharge Instructions  WEIGHT BEARING STATUS: Nonweightbearing right lower extremity  RANGE OF MOTION/ACTIVITY: Okay for unrestricted hip and knee range of motion as tolerated.  Wound Care: You may remove your surgical dressing on postoperative day #2, (Friday, 11/05/2022). Incisions can be left open to air if there is no drainage. Once the incision is completely dry and without drainage, it may be left open to air out.  Showering may begin postoperative day #4, (Sunday, 11/07/2022).  Clean incision gently with soap and water.  DVT/PE prophylaxis: Aspirin 325 mg once daily x 30 days  Diet: as you were eating previously.  Can use over the counter stool softeners and bowel preparations, such as Miralax, to help with bowel movements.  Narcotics can be constipating.  Be sure to drink plenty of fluids  PAIN MEDICATION USE AND EXPECTATIONS  You have likely been given narcotic medications to help control your pain.  After a traumatic event that results in an fracture (broken bone) with or without surgery, it is ok to use narcotic pain medications to help control one's pain.  We understand that everyone responds to pain differently and each individual patient will be evaluated on a regular basis for the continued need for narcotic medications. Ideally, narcotic medication use should last no more than 6-8 weeks (coinciding with fracture healing).   As a patient it is your responsibility as well to monitor narcotic medication use and report the amount and frequency you use these medications when you come to your office visit.   We would also advise that if you are using narcotic medications, you should take a dose prior to therapy to maximize you participation.  IF YOU ARE ON NARCOTIC MEDICATIONS IT IS NOT PERMISSIBLE TO OPERATE A MOTOR VEHICLE (MOTORCYCLE/CAR/TRUCK/MOPED) OR HEAVY MACHINERY DO NOT MIX NARCOTICS WITH OTHER CNS (CENTRAL NERVOUS  SYSTEM) DEPRESSANTS SUCH AS ALCOHOL   STOP SMOKING OR USING NICOTINE PRODUCTS!!!!  As discussed nicotine severely impairs your body's ability to heal surgical and traumatic wounds but also impairs bone healing.  Wounds and bone heal by forming microscopic blood vessels (angiogenesis) and nicotine is a vasoconstrictor (essentially, shrinks blood vessels).  Therefore, if vasoconstriction occurs to these microscopic blood vessels they essentially disappear and are unable to deliver necessary nutrients to the healing tissue.  This is one modifiable factor that you can do to dramatically increase your chances of healing your injury.    (This means no smoking, no nicotine gum, patches, etc)  DO NOT USE NONSTEROIDAL ANTI-INFLAMMATORY DRUGS (NSAID'S)  Using products such as Advil (ibuprofen), Aleve (naproxen), Motrin (ibuprofen) for additional pain control during fracture healing can delay and/or prevent the healing response.  If you would like to take over the counter (OTC) medication, Tylenol (acetaminophen) is ok.  However, some narcotic medications that are given for pain control contain acetaminophen as well. Therefore, you should not exceed more than 4000 mg of tylenol in a day if you do not have liver disease.  Also note that there are may OTC medicines, such as cold medicines and allergy medicines that my contain tylenol as well.  If you have any questions about medications and/or interactions please ask your doctor/PA or your pharmacist.      ICE AND ELEVATE INJURED/OPERATIVE EXTREMITY  Using ice and elevating the injured extremity above your heart can help with swelling and pain control.  Icing in a pulsatile fashion, such as 20 minutes on and 20 minutes off, can be  followed.    Do not place ice directly on skin. Make sure there is a barrier between to skin and the ice pack.    Using frozen items such as frozen peas works well as the conform nicely to the are that needs to be iced.  USE AN ACE WRAP  OR TED HOSE FOR SWELLING CONTROL  In addition to icing and elevation, Ace wraps or TED hose are used to help limit and resolve swelling.  It is recommended to use Ace wraps or TED hose until you are informed to stop.    When using Ace Wraps start the wrapping distally (farthest away from the body) and wrap proximally (closer to the body)   Example: If you had surgery on your leg or thing and you do not have a splint on, start the ace wrap at the toes and work your way up to the thigh        If you had surgery on your upper extremity and do not have a splint on, start the ace wrap at your fingers and work your way up to the upper arm   [place] REFILLS OR WITH ANY QUESTIONS/CONCERNS: [phone]   VISIT OUR WEBSITE FOR ADDITIONAL INFORMATION: [email or web address]    Discharge Wound Care Instructions  Do NOT apply any ointments, solutions or lotions to pin sites or surgical wounds.  These prevent needed drainage and even though solutions like hydrogen peroxide kill bacteria, they also damage cells lining the pin sites that help fight infection.  Applying lotions or ointments can keep the wounds moist and can cause them to breakdown and open up as well. This can increase the risk for infection. When in doubt call the office.  If any drainage is noted, use one layer of adaptic or Mepitel, then gauze, Kerlix, and an ace wrap. - These dressing supplies should be available at local medical supply stores [facility], [facility], etc) as well as Management consultant ([facility], [facility], [place], etc)  Once the incision is completely dry and without drainage, it may be left open to air out.  Showering may begin 36-48 hours later.  Cleaning gently with soap and water.
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
## 2022-11-04 NOTE — Progress Notes (Signed)
Orthopaedic Trauma Progress Note  SUBJECTIVE: Doing okay this morning.  Pain currently well-controlled.  Has not been up out of bed yet since surgery.  Denies any numbness or tingling to her right lower extremity.  Notes that her O2 saturation dropped last night, required patient be placed on supplemental oxygen.  Does not normally wear O2 at home but does note that she was discharged home from the hospital on supplemental O2 following her shoulder surgery 2022.  Denies any shortness of breath currently.  No chest pain. No nausea/vomiting. No other complaints.   OBJECTIVE:  Vitals:   11/04/22 0656 11/04/22 0733  BP: (!) 156/62 (!) 141/53  Pulse: 89 87  Resp:    Temp: 97.9 F (36.6 C) 97.7 F (36.5 C)  SpO2: 97% 91%    General: Resting in bed comfortably, no acute distress.   Respiratory: No increased work of breathing. Currently on 2 L O2 via [place] RLE: Dressings clean, dry, intact.  Swelling about the knee and proximal tibia stable.  Tenderness over this area as expected.  Ankle DF/PF intact.  Endorses sensation of all aspects of the foot.  Able to wiggle the toes.  2+ DP pulse.  Foot warm well-perfused.  IMAGING: Stable post op imaging.   LABS:  Results for orders placed or performed during the hospital encounter of 11/03/22 (from the past 24 hour(s))  Glucose, capillary     Status: Abnormal   Collection Time: 11/03/22  1:01 PM  Result Value Ref Range   Glucose-Capillary 157 (H) 70 - 99 mg/dL  CBC     Status: Abnormal   Collection Time: 11/03/22  2:29 PM  Result Value Ref Range   WBC 11.6 (H) 4.0 - 10.5 K/uL   RBC 3.39 (L) 3.87 - 5.11 MIL/uL   Hemoglobin 9.5 (L) 12.0 - 15.0 g/dL   HCT 29.6 (L) 36.0 - 46.0 %   MCV 87.3 80.0 - 100.0 fL   MCH 28.0 26.0 - 34.0 pg   MCHC 32.1 30.0 - 36.0 g/dL   RDW 14.5 11.5 - 15.5 %   Platelets 372 150 - 400 K/uL   nRBC 0.0 0.0 - 0.2 %  Creatinine, serum     Status: None   Collection Time: 11/03/22  2:29 PM  Result Value Ref Range   Creatinine,  Ser 0.99 0.44 - 1.00 mg/dL   GFR, Estimated >60 >60 mL/min  Glucose, capillary     Status: Abnormal   Collection Time: 11/03/22  4:53 PM  Result Value Ref Range   Glucose-Capillary 130 (H) 70 - 99 mg/dL  Glucose, capillary     Status: Abnormal   Collection Time: 11/03/22  8:13 PM  Result Value Ref Range   Glucose-Capillary 116 (H) 70 - 99 mg/dL  CBC     Status: Abnormal   Collection Time: 11/04/22  2:52 AM  Result Value Ref Range   WBC 10.6 (H) 4.0 - 10.5 K/uL   RBC 3.21 (L) 3.87 - 5.11 MIL/uL   Hemoglobin 9.0 (L) 12.0 - 15.0 g/dL   HCT 28.5 (L) 36.0 - 46.0 %   MCV 88.8 80.0 - 100.0 fL   MCH 28.0 26.0 - 34.0 pg   MCHC 31.6 30.0 - 36.0 g/dL   RDW 14.5 11.5 - 15.5 %   Platelets 362 150 - 400 K/uL   nRBC 0.0 0.0 - 0.2 %  Basic metabolic panel     Status: Abnormal   Collection Time: 11/04/22  2:52 AM  Result Value Ref Range  Sodium 135 135 - 145 mmol/L   Potassium 4.3 3.5 - 5.1 mmol/L   Chloride 103 98 - 111 mmol/L   CO2 26 22 - 32 mmol/L   Glucose, Bld 109 (H) 70 - 99 mg/dL   BUN 17 8 - 23 mg/dL   Creatinine, Ser 0.94 0.44 - 1.00 mg/dL   Calcium 8.6 (L) 8.9 - 10.3 mg/dL   GFR, Estimated >60 >60 mL/min   Anion gap 6 5 - 15  Glucose, capillary     Status: Abnormal   Collection Time: 11/04/22  7:35 AM  Result Value Ref Range   Glucose-Capillary 125 (H) 70 - 99 mg/dL    ASSESSMENT: Cheryl Wallace is a 72 y.o. female, 1 Day Post-Op s/p OPEN REDUCTION INTERNAL FIXATION RIGHT TIBIAL PLATEAU  CV/Blood loss: Acute blood loss anemia, Hgb 9.0 this morning. Hemodynamically stable  PLAN: Weightbearing: NWB RLE ROM: Okay for unrestricted knee ROM as tolerated Incisional and dressing care: Reinforce dressings as needed  Showering: Hold off on showering for now.  Okay to begin getting incisions wet 11/07/2022 if no drainage. Orthopedic device(s): None  Pain management:  1. Tylenol 1000 mg q 6 hours scheduled 2. Robaxin 500 mg q 6 hours PRN 3. Oxycodone 5-15 mg q 4 hours PRN 4.  Dilaudid 0.5-1 mg q 4 hours PRN VTE prophylaxis: Lovenox, SCDs ID:  Ancef 2gm post op completed Foley/Lines:  No foley, KVO IVFs Impediments to Fracture Healing: Diabetes.  Vitamin D level pending, will start supplementation as indicated Dispo: PT/OT evaluation today, dispo pending.  Possible discharge home later today versus tomorrow pending progress with therapies and pain control.   D/C recommendations: -Percocet and Robaxin for pain control -Aspirin 325 mg daily x 30 days for DVT prophylaxis -Possible need for Vit D supplementation  Follow - up plan: 2 weeks   Contact information:  Katha Hamming MD, Rushie Nyhan PA-C. After hours and holidays please check [email or web address] for group call information for [facility]   Gwinda Passe, PA-C [phone] (office) [email or web address]

## 2022-11-04 NOTE — Anesthesia Postprocedure Evaluation (Signed)
Anesthesia Post Note  Patient: Cheryl Wallace  Procedure(s) Performed: OPEN REDUCTION INTERNAL FIXATION (ORIF) TIBIAL PLATEAU (Right)     Patient location during evaluation: PACU Anesthesia Type: General Level of consciousness: awake and alert Pain management: pain level controlled Vital Signs Assessment: post-procedure vital signs reviewed and stable Respiratory status: spontaneous breathing, nonlabored ventilation, respiratory function stable and patient connected to nasal cannula oxygen Cardiovascular status: blood pressure returned to baseline and stable Postop Assessment: no apparent nausea or vomiting Anesthetic complications: no   No notable events documented.  Last Vitals:  Vitals:   11/04/22 0656 11/04/22 0733  BP: (!) 156/62 (!) 141/53  Pulse: 89 87  Resp:    Temp: 36.6 C 36.5 C  SpO2: 97% 91%    Last Pain:  Vitals:   11/04/22 0821  TempSrc:   PainSc: 10-Worst pain ever                 Kimberly Nieland S

## 2022-11-04 NOTE — NC FL2 (Signed)
[place] LEVEL OF CARE FORM     IDENTIFICATION  Patient Name: Cheryl Wallace Birthdate: Jun 24, 1951 Sex: female Admission Date (Current Location): 11/03/2022  [facility] and [place] Number:  Herbalist and Address:  [facility], [place] [address] [place], [place] [address]      Provider Number: [id number]  Attending Physician Name and Address:  Shona Needles, MD  Relative Name and Phone Number:       Current Level of Care: SNF Recommended Level of Care: [place] Prior Approval Number:    Date Approved/Denied:   PASRR Number: pending  Discharge Plan: SNF    Current Diagnoses: Patient Active Problem List   Diagnosis Date Noted   Closed bicondylar fracture of right tibial plateau 11/03/2022   Shellfish allergy 06/24/2022   S/P cholecystectomy 06/24/2022   Preop examination    Hypoxia    Decreased glomerular filtration rate (GFR) 03/26/2021   Right knee injury, sequela 07/17/2020   Acromioclavicular joint arthritis 02/17/2020   Chronic right shoulder pain 02/14/2020   Post-nasal drip 04/12/2019   Educated about COVID-19 virus infection 01/09/2019   Posterior right knee pain 07/12/2018   Iron deficiency anemia 04/07/2018   Hyperlipidemia associated with type 2 diabetes mellitus ([place]) 09/04/2016   Depression, major, single episode, mild ([place]) 09/04/2016   Osteoarthritis of both hands 05/14/2016   S/P hysterectomy with oophorectomy 10/14/2015   Insomnia 10/14/2015   Essential hypertension    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   Subacromial impingement of left shoulder 04/03/2014   Benign lipomatous neoplasm of skin and subcutaneous tissue of left arm 04/03/2014   History of renal calculi 03/21/2014   Type 2 diabetes mellitus with nephropathy ([place]) 03/21/2014   Obesity 12/31/2012   Polyarthralgia 12/30/2012   Family history of colon cancer 12/29/2012   Allergic rhinitis due to fungal spores 12/21/2011    Personal history of colonic polyps 12/27/2002    Orientation RESPIRATION BLADDER Height & Weight     Self, Time, Situation, Place  O2 (2L/min [place]) Continent Weight: 98 kg Height:  '5\' 5"'$  (165.1 cm)  BEHAVIORAL SYMPTOMS/MOOD NEUROLOGICAL BOWEL NUTRITION STATUS      Continent Diet (REFER TO D/C SUMMARY)  AMBULATORY STATUS COMMUNICATION OF NEEDS Skin   Extensive Assist Verbally  (S/P Open reduction internal fixation of right bicondylar tibial plateau fracture, 11/03/2022)                       Personal Care Assistance Level of Assistance  Bathing, Feeding, Dressing Bathing Assistance: Maximum assistance Feeding assistance: Independent Dressing Assistance: Maximum assistance     Functional Limitations Info  Sight, Hearing, Speech Sight Info: Adequate Hearing Info: Adequate Speech Info: Adequate    SPECIAL CARE FACTORS FREQUENCY  PT (By licensed PT), OT (By licensed OT)     PT Frequency: 5x/week, evaluate and treat OT Frequency: 5x/week, evaluate and treat            Contractures Contractures Info: Not present    Additional Factors Info  Code Status, Allergies, Insulin Sliding Scale Code Status Info: Full Code Allergies Info: Shellfish Allergy, Lisinopril, Ozempic (0.25 Or 0.5 Mg-dose) (Semaglutide(0.25 Or 0.'5mg'$ -dos)), Hctz (Hydrochlorothiazide), Penicillins   Insulin Sliding Scale Info: Novolog scliding scale, tid ( with meals ), refer to d/c summary       Current Medications (11/04/2022):  This is the current hospital active medication list Current Facility-Administered Medications  Medication Dose Route Frequency Provider Last Rate Last  Admin   acetaminophen (TYLENOL) tablet 1,000 mg  1,000 mg Oral Q6H Corinne Ports, PA-C   1,000 mg at 11/04/22 0516   amLODipine (NORVASC) tablet 10 mg  10 mg Oral Daily Corinne Ports, PA-C   10 mg at 11/04/22 0810   atorvastatin (LIPITOR) tablet 20 mg  20 mg Oral Daily Corinne Ports, PA-C   20 mg at 11/04/22 0810    carvedilol (COREG) tablet 3.125 mg  3.125 mg Oral BID WC Corinne Ports, PA-C   3.125 mg at 11/04/22 [id number]   cholecalciferol (VITAMIN D3) 25 MCG (1000 UNIT) tablet 1,000 Units  1,000 Units Oral Daily Corinne Ports, PA-C   1,000 Units at 11/04/22 [id number]   diphenhydrAMINE (BENADRYL) capsule 50 mg  50 mg Oral QHS PRN Corinne Ports, PA-C       docusate sodium (COLACE) capsule 100 mg  100 mg Oral BID Corinne Ports, PA-C   100 mg at 11/04/22 0809   empagliflozin (JARDIANCE) tablet 25 mg  25 mg Oral Daily Corinne Ports, PA-C   25 mg at 11/04/22 0811   enoxaparin (LOVENOX) injection 40 mg  40 mg Subcutaneous Q24H Corinne Ports, PA-C   40 mg at 11/04/22 [id number]   fluticasone (FLONASE) 50 MCG/ACT nasal spray 1 spray  1 spray Each Nare Daily PRN Corinne Ports, PA-C       glipiZIDE (GLUCOTROL) tablet 5 mg  5 mg Oral BID AC McClung, Sarah A, PA-C   5 mg at 11/04/22 [id number]   hydrALAZINE (APRESOLINE) tablet 10 mg  10 mg Oral Q6H PRN Corinne Ports, PA-C       HYDROmorphone (DILAUDID) injection 0.5-1 mg  0.5-1 mg Intravenous Q4H PRN Corinne Ports, PA-C   1 mg at 11/04/22 0815   insulin aspart (novoLOG) injection 0-15 Units  0-15 Units Subcutaneous TID WC Corinne Ports, PA-C   2 Units at 11/04/22 [id number]   irbesartan (AVAPRO) tablet 37.5 mg  37.5 mg Oral Daily Corinne Ports, PA-C   37.5 mg at 11/04/22 [id number]   lactated ringers infusion   Intravenous Continuous Corinne Ports, PA-C   New Bag at 11/03/22 [id number]   loratadine (CLARITIN) tablet 10 mg  10 mg Oral Daily Corinne Ports, PA-C   10 mg at 11/04/22 [id number]   And   pseudoephedrine (SUDAFED) 12 hr tablet 120 mg  120 mg Oral BID Corinne Ports, PA-C   120 mg at 11/04/22 [id number]   metFORMIN (GLUCOPHAGE) tablet 1,000 mg  1,000 mg Oral BID WC Corinne Ports, PA-C   1,000 mg at 11/04/22 0809   methocarbamol (ROBAXIN) tablet 500 mg  500 mg Oral Q6H PRN Corinne Ports, PA-C   500 mg at 11/04/22 [id number]   Or   methocarbamol (ROBAXIN) 500 mg in dextrose  5 % 50 mL IVPB  500 mg Intravenous Q6H PRN Corinne Ports, PA-C       metoCLOPramide (REGLAN) tablet 5-10 mg  5-10 mg Oral Q8H PRN Thereasa Solo, Sarah A, PA-C       Or   metoCLOPramide (REGLAN) injection 5-10 mg  5-10 mg Intravenous Q8H PRN Corinne Ports, PA-C       ondansetron (ZOFRAN) tablet 4 mg  4 mg Oral Q6H PRN Corinne Ports, PA-C       Or   ondansetron (ZOFRAN) injection 4 mg  4 mg Intravenous Q6H PRN Thereasa Solo, Sarah A, PA-C       oxyCODONE (Oxy  IR/ROXICODONE) immediate release tablet 10-15 mg  10-15 mg Oral Q4H PRN Corinne Ports, PA-C   15 mg at 11/04/22 [id number]   oxyCODONE (Oxy IR/ROXICODONE) immediate release tablet 5-10 mg  5-10 mg Oral Q4H PRN Corinne Ports, PA-C   5 mg at 11/03/22 1432   pantoprazole (PROTONIX) EC tablet 80 mg  80 mg Oral Daily Corinne Ports, PA-C   80 mg at 11/04/22 0810   polyethylene glycol (MIRALAX / GLYCOLAX) packet 17 g  17 g Oral Daily PRN Corinne Ports, PA-C       spironolactone (ALDACTONE) tablet 25 mg  25 mg Oral Daily Corinne Ports, PA-C   25 mg at 11/04/22 0809   zolpidem (AMBIEN) tablet 5 mg  5 mg Oral QHS PRN Corinne Ports, PA-C         Discharge Medications: Please see discharge summary for a list of discharge medications.  Relevant Imaging Results:  Relevant Lab Results:   Additional Information SS# [id number]  Sharin Mons, RN

## 2022-11-04 NOTE — Plan of Care (Signed)
  Problem: Acute Rehab OT Goals (only OT should resolve) Goal: Pt. Will Perform Lower Body Bathing 11/04/2022 1408 by [place], OT Flowsheets (Taken 11/04/2022 1408) Pt Will Perform Lower Body Bathing:  with modified independence  with adaptive equipment  sitting/lateral leans 11/04/2022 1408 by Alfredo Bach R, OT Flowsheets (Taken 11/04/2022 1408) Pt Will Perform Lower Body Bathing:  with modified independence  with adaptive equipment  sit to/from stand   Problem: Acute Rehab OT Goals (only OT should resolve) Goal: Pt. Will Perform Lower Body Dressing Flowsheets (Taken 11/04/2022 1408) Pt Will Perform Lower Body Dressing:  with modified independence  sit to/from stand  with adaptive equipment   Problem: Acute Rehab OT Goals (only OT should resolve) Goal: Pt. Will Transfer To Toilet Flowsheets (Taken 11/04/2022 1408) Pt Will Transfer to Toilet:  with modified independence  regular height toilet  ambulating   Problem: Acute Rehab OT Goals (only OT should resolve) Goal: Pt. Will Perform Tub/Shower Transfer Flowsheets (Taken 11/04/2022 1408) Pt Will Perform Tub/Shower Transfer:  with modified independence  rolling walker
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
## 2022-11-05 LAB — GLUCOSE, CAPILLARY
Glucose-Capillary: 113 mg/dL — ABNORMAL HIGH (ref 70–99)
Glucose-Capillary: 93 mg/dL (ref 70–99)
Glucose-Capillary: 95 mg/dL (ref 70–99)
Glucose-Capillary: 83 mg/dL (ref 70–99)

## 2022-11-05 LAB — CBC
HCT: 28.5 % — ABNORMAL LOW (ref 36.0–46.0)
Hemoglobin: 8.6 g/dL — ABNORMAL LOW (ref 12.0–15.0)
MCH: 27 pg (ref 26.0–34.0)
MCHC: 30.2 g/dL (ref 30.0–36.0)
MCV: 89.6 fL (ref 80.0–100.0)
Platelets: 366 10*3/uL (ref 150–400)
RBC: 3.18 MIL/uL — ABNORMAL LOW (ref 3.87–5.11)
RDW: 14.5 % (ref 11.5–15.5)
WBC: 9.7 10*3/uL (ref 4.0–10.5)
nRBC: 0 % (ref 0.0–0.2)

## 2022-11-05 NOTE — TOC Progression Note (Addendum)
Transition of Care [facility]) - Progression Note    Patient Details  Name: Cheryl Wallace MRN: [id number] Date of Birth: 12-22-1950  [facility] CM/SW Contact  Joanne Chars, LCSW Phone Number: 11/05/2022, 1:11 PM  Clinical Narrative:   PASSR entered, requires additional information that needs to be uploaded.  CSW presented bed offers to pt on medicare choice document.  Pt accepts offer at [facility].  Per [facility], no beds until Monday.    1430: additional info uploaded in [place].  Still pending passr  Expected Discharge Plan: [place] Barriers to Discharge: Continued Medical Work up  Expected Discharge Plan and Services   Discharge Planning Services: CM Consult   Living arrangements for the past 2 months: Single Family Home                                       Social Determinants of Health (SDOH) Interventions SDOH Screenings   Food Insecurity: No Food Insecurity (11/03/2022)  Housing: Low Risk  (11/03/2022)  Transportation Needs: No Transportation Needs (11/03/2022)  Utilities: Not At Risk (11/03/2022)  Depression (PHQ2-9): Low Risk  (11/01/2022)  Financial Resource Strain: Low Risk  (11/01/2022)  Recent Concern: Financial Resource Strain - High Risk (08/23/2022)  Physical Activity: Unknown (11/01/2022)  Social Connections: Unknown (11/01/2022)  Stress: No Stress Concern Present (11/01/2022)  Tobacco Use: Medium Risk (11/03/2022)    Readmission Risk Interventions     No data to display

## 2022-11-05 NOTE — Plan of Care (Signed)
  Problem: Coping: Goal: Ability to adjust to condition or change in health will improve Outcome: Progressing   Problem: Fluid Volume: Goal: Ability to maintain a balanced intake and output will improve Outcome: Progressing   Problem: Skin Integrity: Goal: Risk for impaired skin integrity will decrease Outcome: Progressing   Problem: Education: Goal: Knowledge of General Education information will improve Description: Including pain rating scale, medication(s)/side effects and non-pharmacologic comfort measures Outcome: Progressing   Problem: Pain Managment: Goal: General experience of comfort will improve Outcome: Progressing   Problem: Pain Managment: Goal: General experience of comfort will improve Outcome: Progressing

## 2022-11-05 NOTE — Progress Notes (Signed)
Mobility Specialist Progress Note    11/05/22 1146  Mobility  Activity Ambulated with assistance in room  Level of Assistance Contact guard assist, steadying assist  Assistive Device Front wheel walker  Distance Ambulated (ft) 4 ft  RLE Weight Bearing NWB  Activity Response Tolerated well  Mobility Referral Yes  $Mobility charge 1 Mobility   Pt received in chair requesting to get back to bed. Left with call bell in reach.   Hildred Alamin Mobility Specialist  Please Psychologist, sport and exercise or Rehab Office at [phone]

## 2022-11-05 NOTE — Progress Notes (Signed)
RE:  Cheryl Wallace       Date of Birth: [birth year]      Date:  11/05/22        To Whom It May Concern:  Please be advised that the above-named patient will require a short-term nursing home stay - anticipated 30 days or less for rehabilitation and strengthening.  The plan is for return home.                 MD signature                Date

## 2022-11-05 NOTE — Progress Notes (Signed)
Orthopaedic Trauma Progress Note  SUBJECTIVE: Doing okay this morning, notes some nausea but no vomiting.  Asking for some medication to help with the nausea.  Pain in the right leg is well-controlled on current medication regimen.  Was able to come off of supplemental oxygen yesterday.  Has some tingling through the right leg but no significant numbness.  Slow to progress with therapies.  Agreeable to SNF as she does not have much help at home.  No other issues of note this morning.  OBJECTIVE:  Vitals:   11/05/22 0500 11/05/22 0750  BP: (!) 147/61 (!) 148/58  Pulse: 88 87  Resp: 20 18  Temp: 98.6 F (37 C) 98 F (36.7 C)  SpO2:  98%    General: Resting in bed comfortably, no acute distress.   Respiratory: No increased work of breathing. RLE: Dressings removed, incisions clean, dry, intact.  New dressing applied.  Swelling about the knee and proximal tibia present but stable.  Tenderness over this area as expected.  Ankle DF/PF intact.  Endorses sensation of all aspects of the foot.  Able to wiggle the toes.  2+ DP pulse.  Foot warm well-perfused.  IMAGING: Stable post op imaging.   LABS:  Results for orders placed or performed during the hospital encounter of 11/03/22 (from the past 24 hour(s))  Glucose, capillary     Status: None   Collection Time: 11/04/22 11:51 AM  Result Value Ref Range   Glucose-Capillary 90 70 - 99 mg/dL  Glucose, capillary     Status: Abnormal   Collection Time: 11/04/22  4:05 PM  Result Value Ref Range   Glucose-Capillary 108 (H) 70 - 99 mg/dL  Glucose, capillary     Status: Abnormal   Collection Time: 11/04/22  8:42 PM  Result Value Ref Range   Glucose-Capillary 125 (H) 70 - 99 mg/dL  CBC     Status: Abnormal   Collection Time: 11/05/22  6:21 AM  Result Value Ref Range   WBC 9.7 4.0 - 10.5 K/uL   RBC 3.18 (L) 3.87 - 5.11 MIL/uL   Hemoglobin 8.6 (L) 12.0 - 15.0 g/dL   HCT 28.5 (L) 36.0 - 46.0 %   MCV 89.6 80.0 - 100.0 fL   MCH 27.0 26.0 - 34.0 pg    MCHC 30.2 30.0 - 36.0 g/dL   RDW 14.5 11.5 - 15.5 %   Platelets 366 150 - 400 K/uL   nRBC 0.0 0.0 - 0.2 %  Glucose, capillary     Status: Abnormal   Collection Time: 11/05/22  7:49 AM  Result Value Ref Range   Glucose-Capillary 113 (H) 70 - 99 mg/dL    ASSESSMENT: Cheryl Wallace is a 72 y.o. female, 2 Days Post-Op s/p OPEN REDUCTION INTERNAL FIXATION RIGHT TIBIAL PLATEAU  CV/Blood loss: Acute blood loss anemia, Hgb 8.6 this morning. Hemodynamically stable  PLAN: Weightbearing: NWB RLE ROM: Okay for unrestricted knee ROM as tolerated Incisional and dressing care: Removed today, continue to change as needed Showering: Okay to begin showering getting incisions wet 11/07/2022 no drainage from incisions  Orthopedic device(s): None  Pain management:  1. Tylenol 1000 mg q 6 hours scheduled 2. Robaxin 500 mg q 6 hours PRN 3. Oxycodone 5-15 mg q 4 hours PRN 4. Dilaudid 0.5-1 mg q 4 hours PRN VTE prophylaxis: Lovenox, SCDs ID:  Ancef 2gm post op completed Foley/Lines:  No foley, KVO IVFs Impediments to Fracture Healing: Diabetes.  Vitamin D level 64, no additional supplementation needed.  Dispo:  Continue to monitor CBC. PT/OT evaluation ongoing, currently recommending SNF.  TOC following for bed placement.    D/C recommendations: -Percocet and Robaxin for pain control -Aspirin 325 mg daily x 30 days for DVT prophylaxis -No need for additional Vit D supplementation  Follow - up plan: 2 weeks after discharge for wound check and repeat x-rays   Contact information:  Katha Hamming MD, Rushie Nyhan PA-C. After hours and holidays please check [email or web address] for group call information for [facility]   Gwinda Passe, PA-C [phone] (office) [email or web address]
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
## 2022-11-06 LAB — CBC
HCT: 29.2 % — ABNORMAL LOW (ref 36.0–46.0)
Hemoglobin: 8.8 g/dL — ABNORMAL LOW (ref 12.0–15.0)
MCH: 27.3 pg (ref 26.0–34.0)
MCHC: 30.1 g/dL (ref 30.0–36.0)
MCV: 90.7 fL (ref 80.0–100.0)
Platelets: 364 10*3/uL (ref 150–400)
RBC: 3.22 MIL/uL — ABNORMAL LOW (ref 3.87–5.11)
RDW: 14.6 % (ref 11.5–15.5)
WBC: 8.2 10*3/uL (ref 4.0–10.5)
nRBC: 0.2 % (ref 0.0–0.2)

## 2022-11-06 LAB — GLUCOSE, CAPILLARY
Glucose-Capillary: 120 mg/dL — ABNORMAL HIGH (ref 70–99)
Glucose-Capillary: 140 mg/dL — ABNORMAL HIGH (ref 70–99)
Glucose-Capillary: 100 mg/dL — ABNORMAL HIGH (ref 70–99)
Glucose-Capillary: 95 mg/dL (ref 70–99)

## 2022-11-06 MED ORDER — METHOCARBAMOL 500 MG PO TABS: 500.0000 mg | ORAL_TABLET | Freq: Four times a day (QID) | ORAL | 0 refills | Status: DC | PRN

## 2022-11-06 MED ORDER — OXYCODONE HCL 10 MG PO TABS: 10.0000 mg | ORAL_TABLET | ORAL | 0 refills | Status: DC | PRN

## 2022-11-06 MED ORDER — ASPIRIN 325 MG PO TBEC: 325.0000 mg | DELAYED_RELEASE_TABLET | Freq: Every day | ORAL | 0 refills | Status: AC

## 2022-11-06 NOTE — TOC Progression Note (Signed)
Transition of Care [facility]) - Progression Note    Patient Details  Name: Cheryl Wallace MRN: [id number] Date of Birth: 1951-07-20  Transition of Care [facility]) CM/SW Contact  Ina Homes, [place] Phone Number: 11/06/2022, 1:14 PM  Clinical Narrative:     Updated FL2 uploaded to [place] spoke with Theadora Rama [facility] [phone]) Josem Kaufmann started, [id number], Faxed [phone]    Expected Discharge Plan: [place] Barriers to Discharge: Continued Medical Work up  Expected Discharge Plan and Services   Discharge Planning Services: CM Consult   Living arrangements for the past 2 months: Single Family Home                                       Social Determinants of Health (SDOH) Interventions SDOH Screenings   Food Insecurity: No Food Insecurity (11/03/2022)  Housing: Low Risk  (11/03/2022)  Transportation Needs: No Transportation Needs (11/03/2022)  Utilities: Not At Risk (11/03/2022)  Depression (PHQ2-9): Low Risk  (11/01/2022)  Financial Resource Strain: Low Risk  (11/01/2022)  Recent Concern: Financial Resource Strain - High Risk (08/23/2022)  Physical Activity: Unknown (11/01/2022)  Social Connections: Unknown (11/01/2022)  Stress: No Stress Concern Present (11/01/2022)  Tobacco Use: Medium Risk (11/03/2022)    Readmission Risk Interventions     No data to display

## 2022-11-06 NOTE — NC FL2 (Signed)
[place] LEVEL OF CARE FORM     IDENTIFICATION  Patient Name: Cheryl Wallace Birthdate: 18-May-1951 Sex: female Admission Date (Current Location): 11/03/2022  [facility] and [place] Number:  Herbalist and Address:  [facility] [place], [place] [address], [place], [place] [address]      Provider Number: [id number]  Attending Physician Name and Address:  Shona Needles, MD  Relative Name and Phone Number:       Current Level of Care: Hospital Recommended Level of Care: [place] Prior Approval Number:    Date Approved/Denied:   PASRR Number: pending  Discharge Plan: SNF    Current Diagnoses: Patient Active Problem List   Diagnosis Date Noted   Closed bicondylar fracture of right tibial plateau 11/03/2022   Shellfish allergy 06/24/2022   S/P cholecystectomy 06/24/2022   Preop examination    Hypoxia    Decreased glomerular filtration rate (GFR) 03/26/2021   Right knee injury, sequela 07/17/2020   Acromioclavicular joint arthritis 02/17/2020   Chronic right shoulder pain 02/14/2020   Post-nasal drip 04/12/2019   Educated about COVID-19 virus infection 01/09/2019   Posterior right knee pain 07/12/2018   Iron deficiency anemia 04/07/2018   Hyperlipidemia associated with type 2 diabetes mellitus ([place]) 09/04/2016   Depression, major, single episode, mild ([place]) 09/04/2016   Osteoarthritis of both hands 05/14/2016   S/P hysterectomy with oophorectomy 10/14/2015   Insomnia 10/14/2015   Essential hypertension    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   Subacromial impingement of left shoulder 04/03/2014   Benign lipomatous neoplasm of skin and subcutaneous tissue of left arm 04/03/2014   History of renal calculi 03/21/2014   Type 2 diabetes mellitus with nephropathy ([place]) 03/21/2014   Obesity 12/31/2012   Polyarthralgia 12/30/2012   Family history of colon cancer 12/29/2012   Allergic rhinitis due to fungal spores 12/21/2011    Personal history of colonic polyps 12/27/2002    Orientation RESPIRATION BLADDER Height & Weight     Self, Time, Situation, Place  O2 (2L/min [place]) Continent Weight: 216 lb (98 kg) Height:  '5\' 5"'$  (165.1 cm)  BEHAVIORAL SYMPTOMS/MOOD NEUROLOGICAL BOWEL NUTRITION STATUS      Continent Diet (REFER TO D/C SUMMARY)  AMBULATORY STATUS COMMUNICATION OF NEEDS Skin   Extensive Assist Verbally  (S/P Open reduction internal fixation of right bicondylar tibial plateau fracture, 11/03/2022)                       Personal Care Assistance Level of Assistance  Bathing, Feeding, Dressing Bathing Assistance: Maximum assistance Feeding assistance: Independent Dressing Assistance: Maximum assistance     Functional Limitations Info  Sight, Hearing, Speech Sight Info: Adequate Hearing Info: Adequate Speech Info: Adequate    SPECIAL CARE FACTORS FREQUENCY  PT (By licensed PT), OT (By licensed OT)     PT Frequency: 5x/week, evaluate and treat OT Frequency: 5x/week, evaluate and treat            Contractures Contractures Info: Not present    Additional Factors Info  Code Status, Allergies, Insulin Sliding Scale Code Status Info: Full Code Allergies Info: Shellfish Allergy, Lisinopril, Ozempic (0.25 Or 0.5 Mg-dose) (Semaglutide(0.25 Or 0.'5mg'$ -dos)), Hctz (Hydrochlorothiazide), Penicillins   Insulin Sliding Scale Info: Novolog scliding scale, tid ( with meals ), refer to d/c summary       Current Medications (11/06/2022):  This is the current hospital active medication list Current Facility-Administered Medications  Medication Dose Route Frequency Provider Last  Rate Last Admin   acetaminophen (TYLENOL) tablet 1,000 mg  1,000 mg Oral Q6H McClung, Sarah A, PA-C   1,000 mg at 11/06/22 1106   amLODipine (NORVASC) tablet 10 mg  10 mg Oral Daily Corinne Ports, PA-C   10 mg at 11/06/22 0915   atorvastatin (LIPITOR) tablet 20 mg  20 mg Oral Daily Corinne Ports, PA-C   20 mg at 11/06/22  0914   carvedilol (COREG) tablet 3.125 mg  3.125 mg Oral BID WC Corinne Ports, PA-C   3.125 mg at 11/06/22 [id number]   cholecalciferol (VITAMIN D3) 25 MCG (1000 UNIT) tablet 1,000 Units  1,000 Units Oral Daily Corinne Ports, PA-C   1,000 Units at 11/06/22 0915   diphenhydrAMINE (BENADRYL) capsule 50 mg  50 mg Oral QHS PRN Corinne Ports, PA-C       docusate sodium (COLACE) capsule 100 mg  100 mg Oral BID Corinne Ports, PA-C   100 mg at 11/06/22 0915   empagliflozin (JARDIANCE) tablet 25 mg  25 mg Oral Daily Corinne Ports, PA-C   25 mg at 11/06/22 1106   enoxaparin (LOVENOX) injection 40 mg  40 mg Subcutaneous Q24H Rushie Nyhan A, PA-C   40 mg at 11/06/22 0915   fluticasone (FLONASE) 50 MCG/ACT nasal spray 1 spray  1 spray Each Nare Daily PRN Corinne Ports, PA-C       glipiZIDE (GLUCOTROL) tablet 5 mg  5 mg Oral BID AC McClung, Sarah A, PA-C   5 mg at 11/06/22 0915   hydrALAZINE (APRESOLINE) tablet 10 mg  10 mg Oral Q6H PRN Corinne Ports, PA-C       HYDROmorphone (DILAUDID) injection 0.5-1 mg  0.5-1 mg Intravenous Q4H PRN Corinne Ports, PA-C   1 mg at 11/04/22 0815   insulin aspart (novoLOG) injection 0-15 Units  0-15 Units Subcutaneous TID WC Corinne Ports, PA-C   2 Units at 11/06/22 1230   irbesartan (AVAPRO) tablet 37.5 mg  37.5 mg Oral Daily Corinne Ports, PA-C   37.5 mg at 11/06/22 1105   lactated ringers infusion   Intravenous Continuous Corinne Ports, PA-C   New Bag at 11/03/22 [id number]   loratadine (CLARITIN) tablet 10 mg  10 mg Oral Daily Corinne Ports, PA-C   10 mg at 11/06/22 [id number]   And   pseudoephedrine (SUDAFED) 12 hr tablet 120 mg  120 mg Oral BID Corinne Ports, PA-C   120 mg at 11/05/22 2220   metFORMIN (GLUCOPHAGE) tablet 1,000 mg  1,000 mg Oral BID WC Corinne Ports, PA-C   1,000 mg at 11/06/22 0915   methocarbamol (ROBAXIN) tablet 500 mg  500 mg Oral Q6H PRN Corinne Ports, PA-C   500 mg at 11/05/22 2017   Or   methocarbamol (ROBAXIN) 500 mg in  dextrose 5 % 50 mL IVPB  500 mg Intravenous Q6H PRN Corinne Ports, PA-C       metoCLOPramide (REGLAN) tablet 5-10 mg  5-10 mg Oral Q8H PRN Thereasa Solo, Sarah A, PA-C       Or   metoCLOPramide (REGLAN) injection 5-10 mg  5-10 mg Intravenous Q8H PRN Corinne Ports, PA-C       ondansetron (ZOFRAN) tablet 4 mg  4 mg Oral Q6H PRN Rushie Nyhan A, PA-C   4 mg at 11/05/22 1013   Or   ondansetron (ZOFRAN) injection 4 mg  4 mg Intravenous Q6H PRN Corinne Ports, PA-C  oxyCODONE (Oxy IR/ROXICODONE) immediate release tablet 10-15 mg  10-15 mg Oral Q4H PRN Corinne Ports, PA-C   15 mg at 11/04/22 1642   oxyCODONE (Oxy IR/ROXICODONE) immediate release tablet 5-10 mg  5-10 mg Oral Q4H PRN Corinne Ports, PA-C   5 mg at 11/05/22 2017   pantoprazole (PROTONIX) EC tablet 80 mg  80 mg Oral Daily Corinne Ports, PA-C   80 mg at 11/06/22 [id number]   polyethylene glycol (MIRALAX / GLYCOLAX) packet 17 g  17 g Oral Daily PRN Corinne Ports, PA-C       spironolactone (ALDACTONE) tablet 25 mg  25 mg Oral Daily Rushie Nyhan A, PA-C   25 mg at 11/06/22 0914   zolpidem (AMBIEN) tablet 5 mg  5 mg Oral QHS PRN Corinne Ports, PA-C   5 mg at 11/06/22 0028     Discharge Medications: Please see discharge summary for a list of discharge medications.  Relevant Imaging Results:  Relevant Lab Results:   Additional Information SS# [id number]  [place], LCSWA
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
## 2022-11-06 NOTE — Plan of Care (Signed)
  Problem: Coping: Goal: Ability to adjust to condition or change in health will improve Outcome: Progressing   Problem: Fluid Volume: Goal: Ability to maintain a balanced intake and output will improve Outcome: Progressing   Problem: Health Behavior/Discharge Planning: Goal: Ability to identify and utilize available resources and services will improve Outcome: Progressing Goal: Ability to manage health-related needs will improve Outcome: Progressing   Problem: Metabolic: Goal: Ability to maintain appropriate glucose levels will improve Outcome: Progressing   Problem: Nutritional: Goal: Maintenance of adequate nutrition will improve Outcome: Progressing   Problem: Skin Integrity: Goal: Risk for impaired skin integrity will decrease Outcome: Progressing

## 2022-11-06 NOTE — Progress Notes (Signed)
Orthopaedic Trauma Progress Note  Patient resting comfortably this morning. No acute events overnight. Patient remains stable for discharge to SNF. [place] will have bed available on Monday 11/08/22.   OBJECTIVE:  Vitals:   11/05/22 2011 11/06/22 0525  BP: (!) 140/60 (!) 140/57  Pulse: 81 84  Resp: 18 16  Temp: 98.3 F (36.8 C) 97.9 F (36.6 C)  SpO2: 94% 96%    IMAGING: Stable post op imaging.   LABS:  Results for orders placed or performed during the hospital encounter of 11/03/22 (from the past 24 hour(s))  Glucose, capillary     Status: Abnormal   Collection Time: 11/05/22  7:49 AM  Result Value Ref Range   Glucose-Capillary 113 (H) 70 - 99 mg/dL  Glucose, capillary     Status: None   Collection Time: 11/05/22 11:57 AM  Result Value Ref Range   Glucose-Capillary 93 70 - 99 mg/dL  Glucose, capillary     Status: None   Collection Time: 11/05/22  5:10 PM  Result Value Ref Range   Glucose-Capillary 95 70 - 99 mg/dL  Glucose, capillary     Status: None   Collection Time: 11/05/22 10:24 PM  Result Value Ref Range   Glucose-Capillary 83 70 - 99 mg/dL    ASSESSMENT: Cheryl Wallace is a 72 y.o. female, 3 Days Post-Op s/p OPEN REDUCTION INTERNAL FIXATION RIGHT TIBIAL PLATEAU  CV/Blood loss: Acute blood loss anemia, Hgb 8.6 on 11/05/22. CBC pending this AM. Hemodynamically stable  PLAN: Weightbearing: NWB RLE ROM: Okay for unrestricted knee ROM as tolerated Incisional and dressing care: continue to change as needed Showering: Okay to begin showering getting incisions wet 11/07/2022 no drainage from incisions  Orthopedic device(s): None  Pain management:  1. Tylenol 1000 mg q 6 hours scheduled 2. Robaxin 500 mg q 6 hours PRN 3. Oxycodone 5-15 mg q 4 hours PRN 4. Dilaudid 0.5-1 mg q 4 hours PRN VTE prophylaxis: Lovenox, SCDs ID:  Ancef 2gm post op completed Foley/Lines:  No foley, KVO IVFs Impediments to Fracture Healing: Diabetes.  Vitamin D level 64, no additional  supplementation needed.  Dispo:  PT/OT evaluation ongoing, currently recommending SNF. TOC following and arranging bed placement at [facility]. OK for d/c once bed available and insurance authorization received.   D/C recommendations: -Oxycodone, Tylenol, and Robaxin for pain control -Aspirin 325 mg daily x 30 days for DVT prophylaxis -No need for additional Vit D supplementation  Follow - up plan: 2 weeks after discharge for wound check and repeat x-rays   Contact information:  Katha Hamming MD, Rushie Nyhan PA-C. After hours and holidays please check [email or web address] for group call information for [facility]   Gwinda Passe, PA-C [phone] (office) [email or web address]

## 2022-11-07 LAB — GLUCOSE, CAPILLARY
Glucose-Capillary: 121 mg/dL — ABNORMAL HIGH (ref 70–99)
Glucose-Capillary: 65 mg/dL — ABNORMAL LOW (ref 70–99)
Glucose-Capillary: 75 mg/dL (ref 70–99)
Glucose-Capillary: 135 mg/dL — ABNORMAL HIGH (ref 70–99)
Glucose-Capillary: 111 mg/dL — ABNORMAL HIGH (ref 70–99)

## 2022-11-07 NOTE — TOC Progression Note (Signed)
Transition of Care [facility]) - Progression Note    Patient Details  Name: Cheryl Wallace MRN: [id number] Date of Birth: [birth year]  [facility]) CM/SW Contact  Ina Homes, [place] Phone Number: 11/07/2022, 11:53 AM  Clinical Narrative:     SW received VM from [place] approved 3/4 to 3/6 Navi# [id number] CC: Claudina Lick Fax: [phone]  PASRR still pending    Expected Discharge Plan: [place] Barriers to Discharge: Continued Medical Work up  Expected Discharge Plan and Services   Discharge Planning Services: CM Consult   Living arrangements for the past 2 months: Single Family Home                                       Social Determinants of Health (SDOH) Interventions SDOH Screenings   Food Insecurity: No Food Insecurity (11/03/2022)  Housing: Low Risk  (11/03/2022)  Transportation Needs: No Transportation Needs (11/03/2022)  Utilities: Not At Risk (11/03/2022)  Depression (PHQ2-9): Low Risk  (11/01/2022)  Financial Resource Strain: Low Risk  (11/01/2022)  Recent Concern: Financial Resource Strain - High Risk (08/23/2022)  Physical Activity: Unknown (11/01/2022)  Social Connections: Unknown (11/01/2022)  Stress: No Stress Concern Present (11/01/2022)  Tobacco Use: Medium Risk (11/03/2022)    Readmission Risk Interventions     No data to display

## 2022-11-07 NOTE — Progress Notes (Signed)
Orthopaedic Trauma Progress Note  SUBJECTIVE: Doing well this morning.  Pain improving.  No specific concerns or complaints currently. Tolerating diet and fluids. Patient remains stable for discharge to SNF.    OBJECTIVE:  Vitals:   11/07/22 0422 11/07/22 0727  BP: (!) 117/56 (!) 144/61  Pulse: 83 85  Resp: 17   Temp: 97.6 F (36.4 C) (!) 97.4 F (36.3 C)  SpO2: 96% 96%    General: Sitting up in bed, about to eat breakfast.  No acute distress.   Respiratory: No increased work of breathing. RLE: Ace wrap over the knee is clean dry and intact.  Swelling about the knee and proximal tibia present but stable.  Tenderness over this area as expected.  Ankle DF/PF intact.  Endorses sensation of all aspects of the foot.  Able to wiggle the toes.  2+ DP pulse.  Foot warm well-perfused.  IMAGING: Stable post op imaging.   LABS:  Results for orders placed or performed during the hospital encounter of 11/03/22 (from the past 24 hour(s))  Glucose, capillary     Status: Abnormal   Collection Time: 11/06/22 12:07 PM  Result Value Ref Range   Glucose-Capillary 140 (H) 70 - 99 mg/dL  Glucose, capillary     Status: Abnormal   Collection Time: 11/06/22  4:24 PM  Result Value Ref Range   Glucose-Capillary 100 (H) 70 - 99 mg/dL  Glucose, capillary     Status: None   Collection Time: 11/06/22  8:03 PM  Result Value Ref Range   Glucose-Capillary 95 70 - 99 mg/dL  Glucose, capillary     Status: Abnormal   Collection Time: 11/07/22  7:29 AM  Result Value Ref Range   Glucose-Capillary 121 (H) 70 - 99 mg/dL    ASSESSMENT: Cheryl Wallace is a 72 y.o. female, 4 Days Post-Op s/p OPEN REDUCTION INTERNAL FIXATION RIGHT TIBIAL PLATEAU  CV/Blood loss: Acute blood loss anemia, Hgb 8.8 on 11/06/2022.  Hemodynamically stable  PLAN: Weightbearing: NWB RLE ROM: Okay for unrestricted knee ROM as tolerated Incisional and dressing care: Change dressing RLE as needed Showering: Okay to begin showering getting  incisions wet 11/07/2022 no drainage from incisions  Orthopedic device(s): None  Pain management:  1. Tylenol 1000 mg q 6 hours scheduled 2. Robaxin 500 mg q 6 hours PRN 3. Oxycodone 5-15 mg q 4 hours PRN 4. Dilaudid 0.5-1 mg q 4 hours PRN VTE prophylaxis: Lovenox, SCDs ID:  Ancef 2gm post op completed Foley/Lines:  No foley, KVO IVFs Impediments to Fracture Healing: Diabetes.  Vitamin D level 64, no additional supplementation needed.  Dispo: None therapy recommending SNF.  [facility] with bed available on Monday.  Insurance authorization pending.  D/C recommendations: -Oxycodone and Robaxin for pain control -Aspirin 325 mg daily x 30 days for DVT prophylaxis -No need for additional Vit D supplementation  Follow - up plan: 2 weeks after discharge for wound check and repeat x-rays   Contact information:  Katha Hamming MD, Rushie Nyhan PA-C. After hours and holidays please check [email or web address] for group call information for [facility]   Gwinda Passe, PA-C [phone] (office) [email or web address]

## 2022-11-07 NOTE — Progress Notes (Signed)
   11/07/22 1000  Mobility  Activity Ambulated with assistance in room;Transferred from bed to chair  Level of Assistance Minimal assist, patient does 75% or more  Assistive Device Front wheel walker  Distance Ambulated (ft) 4 ft  RLE Weight Bearing NWB  Activity Response Tolerated well  Mobility Referral Yes  $Mobility charge 1 Mobility   Mobility Specialist Progress Note  Pt was in bed and agreeable. Had no c/o pain. Left in chair w/ alarm on and call bell in reach.   Lucious Groves Mobility Specialist  Please contact via SecureChat or Rehab office at [phone]

## 2022-11-08 ENCOUNTER — Encounter (HOSPITAL_COMMUNITY): Payer: Self-pay | Admitting: Student

## 2022-11-08 DIAGNOSIS — E1121 Type 2 diabetes mellitus with diabetic nephropathy: Secondary | ICD-10-CM | POA: Diagnosis not present

## 2022-11-08 DIAGNOSIS — E559 Vitamin D deficiency, unspecified: Secondary | ICD-10-CM | POA: Diagnosis not present

## 2022-11-08 DIAGNOSIS — E119 Type 2 diabetes mellitus without complications: Secondary | ICD-10-CM | POA: Diagnosis not present

## 2022-11-08 DIAGNOSIS — Z9181 History of falling: Secondary | ICD-10-CM | POA: Diagnosis not present

## 2022-11-08 DIAGNOSIS — R609 Edema, unspecified: Secondary | ICD-10-CM | POA: Diagnosis not present

## 2022-11-08 DIAGNOSIS — S82141D Displaced bicondylar fracture of right tibia, subsequent encounter for closed fracture with routine healing: Secondary | ICD-10-CM | POA: Diagnosis not present

## 2022-11-08 DIAGNOSIS — I10 Essential (primary) hypertension: Secondary | ICD-10-CM | POA: Diagnosis not present

## 2022-11-08 DIAGNOSIS — E785 Hyperlipidemia, unspecified: Secondary | ICD-10-CM | POA: Diagnosis not present

## 2022-11-08 DIAGNOSIS — E1165 Type 2 diabetes mellitus with hyperglycemia: Secondary | ICD-10-CM | POA: Diagnosis not present

## 2022-11-08 DIAGNOSIS — R41841 Cognitive communication deficit: Secondary | ICD-10-CM | POA: Diagnosis not present

## 2022-11-08 DIAGNOSIS — M19042 Primary osteoarthritis, left hand: Secondary | ICD-10-CM | POA: Diagnosis not present

## 2022-11-08 DIAGNOSIS — M6281 Muscle weakness (generalized): Secondary | ICD-10-CM | POA: Diagnosis not present

## 2022-11-08 DIAGNOSIS — R2689 Other abnormalities of gait and mobility: Secondary | ICD-10-CM | POA: Diagnosis not present

## 2022-11-08 DIAGNOSIS — R1312 Dysphagia, oropharyngeal phase: Secondary | ICD-10-CM | POA: Diagnosis not present

## 2022-11-08 DIAGNOSIS — G4700 Insomnia, unspecified: Secondary | ICD-10-CM | POA: Diagnosis not present

## 2022-11-08 DIAGNOSIS — S83281D Other tear of lateral meniscus, current injury, right knee, subsequent encounter: Secondary | ICD-10-CM | POA: Diagnosis not present

## 2022-11-08 DIAGNOSIS — R2681 Unsteadiness on feet: Secondary | ICD-10-CM | POA: Diagnosis not present

## 2022-11-08 LAB — GLUCOSE, CAPILLARY
Glucose-Capillary: 111 mg/dL — ABNORMAL HIGH (ref 70–99)
Glucose-Capillary: 146 mg/dL — ABNORMAL HIGH (ref 70–99)

## 2022-11-08 NOTE — TOC Progression Note (Addendum)
Transition of Care [facility]) - Progression Note    Patient Details  Name: Cheryl Wallace MRN: [id number] Date of Birth: February 23, 1951  [facility] CM/SW Contact  Joanne Chars, LCSW Phone Number: 11/08/2022, 10:12 AM  Clinical Narrative:   Still no passr, issue with FL2 signature, new FL2 resigned by MD and submitted to [place] [facility].  Confirmed with [facility] that they can receive pt today.  1010: passr received: [id number]  MD/PA informed.    1110: CSW spoke with pt daughter [name] regarding providing transportation to SNF. She can do this, will take her an hour to get her.      Expected Discharge Plan: [place] Barriers to Discharge: Continued Medical Work up  Expected Discharge Plan and Services   Discharge Planning Services: CM Consult   Living arrangements for the past 2 months: Single Family Home                                       Social Determinants of Health (SDOH) Interventions SDOH Screenings   Food Insecurity: No Food Insecurity (11/03/2022)  Housing: Low Risk  (11/03/2022)  Transportation Needs: No Transportation Needs (11/03/2022)  Utilities: Not At Risk (11/03/2022)  Depression (PHQ2-9): Low Risk  (11/01/2022)  Financial Resource Strain: Low Risk  (11/01/2022)  Recent Concern: Financial Resource Strain - High Risk (08/23/2022)  Physical Activity: Unknown (11/01/2022)  Social Connections: Unknown (11/01/2022)  Stress: No Stress Concern Present (11/01/2022)  Tobacco Use: Medium Risk (11/08/2022)    Readmission Risk Interventions     No data to display
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
## 2022-11-08 NOTE — TOC Transition Note (Signed)
Transition of Care [facility]) - CM/SW Discharge Note   Patient Details  Name: Cheryl Wallace MRN: [id number] Date of Birth: 05-31-1951  Transition of Care [facility]) CM/SW Contact:  Joanne Chars, LCSW Phone Number: 11/08/2022, 11:23 AM   Clinical Narrative:   Pt discharging to [facility].  RN call [phone] for report.  Pt daughter [name] will provide transportation.  Will need pt brought to main [place] tower entrance and assistance getting pt into the vehicle.  RN informed.      Final next level of care: Skilled Nursing Facility Barriers to Discharge: Barriers Resolved   Patient Goals and CMS Choice   Choice offered to / list presented to : Patient  Discharge Placement                Patient chooses bed at: [facility] Patient to be transferred to facility by: daughter [name] Name of family member notified: daughter [name] Patient and family notified of of transfer: 11/08/22  Discharge Plan and Services Additional resources added to the After Visit Summary for     Discharge Planning Services: CM Consult                                 Social Determinants of Health (SDOH) Interventions SDOH Screenings   Food Insecurity: No Food Insecurity (11/03/2022)  Housing: Low Risk  (11/03/2022)  Transportation Needs: No Transportation Needs (11/03/2022)  Utilities: Not At Risk (11/03/2022)  Depression (PHQ2-9): Low Risk  (11/01/2022)  Financial Resource Strain: Low Risk  (11/01/2022)  Recent Concern: Financial Resource Strain - High Risk (08/23/2022)  Physical Activity: Unknown (11/01/2022)  Social Connections: Unknown (11/01/2022)  Stress: No Stress Concern Present (11/01/2022)  Tobacco Use: Medium Risk (11/08/2022)     Readmission Risk Interventions     No data to display

## 2022-11-08 NOTE — NC FL2 (Signed)
[place] LEVEL OF CARE FORM     IDENTIFICATION  Patient Name: Cheryl Wallace Birthdate: 1951-05-14 Sex: female Admission Date (Current Location): 11/03/2022  [facility] and [place] Number:  Herbalist and Address:  [facility], [place] [address] [place], [place] [address]      Provider Number: [id number]  Attending Physician Name and Address:  Shona Needles, MD  Relative Name and Phone Number:       Current Level of Care: Hospital Recommended Level of Care: [facility] Prior Approval Number:    Date Approved/Denied:   PASRR Number: pending  Discharge Plan: SNF    Current Diagnoses: Patient Active Problem List   Diagnosis Date Noted   Closed bicondylar fracture of right tibial plateau 11/03/2022   Shellfish allergy 06/24/2022   S/P cholecystectomy 06/24/2022   Preop examination    Hypoxia    Decreased glomerular filtration rate (GFR) 03/26/2021   Right knee injury, sequela 07/17/2020   Acromioclavicular joint arthritis 02/17/2020   Chronic right shoulder pain 02/14/2020   Post-nasal drip 04/12/2019   Educated about COVID-19 virus infection 01/09/2019   Posterior right knee pain 07/12/2018   Iron deficiency anemia 04/07/2018   Hyperlipidemia associated with type 2 diabetes mellitus ([place]) 09/04/2016   Depression, major, single episode, mild ([place]) 09/04/2016   Osteoarthritis of both hands 05/14/2016   S/P hysterectomy with oophorectomy 10/14/2015   Insomnia 10/14/2015   Essential hypertension    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   Subacromial impingement of left shoulder 04/03/2014   Benign lipomatous neoplasm of skin and subcutaneous tissue of left arm 04/03/2014   History of renal calculi 03/21/2014   Type 2 diabetes mellitus with nephropathy ([place]) 03/21/2014   Obesity 12/31/2012   Polyarthralgia 12/30/2012   Family history of colon cancer 12/29/2012   Allergic rhinitis due to fungal spores 12/21/2011    Personal history of colonic polyps 12/27/2002    Orientation RESPIRATION BLADDER Height & Weight     Self, Time, Situation, Place  O2 (2L/min ) Continent Weight: 216 lb (98 kg) Height:  '5\' 5"'$  (165.1 cm)  BEHAVIORAL SYMPTOMS/MOOD NEUROLOGICAL BOWEL NUTRITION STATUS      Continent Diet (REFER TO D/C SUMMARY)  AMBULATORY STATUS COMMUNICATION OF NEEDS Skin   Extensive Assist Verbally  (S/P Open reduction internal fixation of right bicondylar tibial plateau fracture, 11/03/2022)                       Personal Care Assistance Level of Assistance  Bathing, Feeding, Dressing Bathing Assistance: Maximum assistance Feeding assistance: Independent Dressing Assistance: Maximum assistance     Functional Limitations Info  Sight, Hearing, Speech Sight Info: Adequate Hearing Info: Adequate Speech Info: Adequate    SPECIAL CARE FACTORS FREQUENCY  PT (By licensed PT), OT (By licensed OT)     PT Frequency: 5x/week, evaluate and treat OT Frequency: 5x/week, evaluate and treat            Contractures Contractures Info: Not present    Additional Factors Info  Code Status, Allergies, Insulin Sliding Scale Code Status Info: Full Code Allergies Info: Shellfish Allergy, Lisinopril, Ozempic (0.25 Or 0.5 Mg-dose) (Semaglutide(0.25 Or 0.'5mg'$ -dos)), Hctz (Hydrochlorothiazide), Penicillins   Insulin Sliding Scale Info: Novolog scliding scale, tid ( with meals ), refer to d/c summary       Current Medications (11/08/2022):  This is the current hospital active medication list Current Facility-Administered Medications  Medication Dose Route Frequency Provider Last  Rate Last Admin   acetaminophen (TYLENOL) tablet 1,000 mg  1,000 mg Oral Q6H McClung, Sarah A, PA-C   1,000 mg at 11/08/22 0540   amLODipine (NORVASC) tablet 10 mg  10 mg Oral Daily Corinne Ports, PA-C   10 mg at 11/08/22 0818   atorvastatin (LIPITOR) tablet 20 mg  20 mg Oral Daily Corinne Ports, PA-C   20 mg at 11/08/22  0818   carvedilol (COREG) tablet 3.125 mg  3.125 mg Oral BID WC Corinne Ports, PA-C   3.125 mg at 11/08/22 0818   cholecalciferol (VITAMIN D3) 25 MCG (1000 UNIT) tablet 1,000 Units  1,000 Units Oral Daily Corinne Ports, PA-C   1,000 Units at 11/08/22 0818   diphenhydrAMINE (BENADRYL) capsule 50 mg  50 mg Oral QHS PRN Corinne Ports, PA-C       docusate sodium (COLACE) capsule 100 mg  100 mg Oral BID Corinne Ports, PA-C   100 mg at 11/08/22 [id number]   empagliflozin (JARDIANCE) tablet 25 mg  25 mg Oral Daily Corinne Ports, PA-C   25 mg at 11/08/22 0818   enoxaparin (LOVENOX) injection 40 mg  40 mg Subcutaneous Q24H Rushie Nyhan A, PA-C   40 mg at 11/08/22 0817   fluticasone (FLONASE) 50 MCG/ACT nasal spray 1 spray  1 spray Each Nare Daily PRN Corinne Ports, PA-C       glipiZIDE (GLUCOTROL) tablet 5 mg  5 mg Oral BID AC McClung, Sarah A, PA-C   5 mg at 11/08/22 0818   hydrALAZINE (APRESOLINE) tablet 10 mg  10 mg Oral Q6H PRN Corinne Ports, PA-C       HYDROmorphone (DILAUDID) injection 0.5-1 mg  0.5-1 mg Intravenous Q4H PRN Corinne Ports, PA-C   1 mg at 11/04/22 0815   insulin aspart (novoLOG) injection 0-15 Units  0-15 Units Subcutaneous TID WC Corinne Ports, PA-C   2 Units at 11/07/22 [id number]   irbesartan (AVAPRO) tablet 37.5 mg  37.5 mg Oral Daily Corinne Ports, PA-C   37.5 mg at 11/08/22 [id number]   lactated ringers infusion   Intravenous Continuous Corinne Ports, PA-C   New Bag at 11/03/22 [id number]   loratadine (CLARITIN) tablet 10 mg  10 mg Oral Daily Corinne Ports, PA-C   10 mg at 11/08/22 [id number]   And   pseudoephedrine (SUDAFED) 12 hr tablet 120 mg  120 mg Oral BID Corinne Ports, PA-C   120 mg at 11/08/22 0818   metFORMIN (GLUCOPHAGE) tablet 1,000 mg  1,000 mg Oral BID WC Corinne Ports, PA-C   1,000 mg at 11/08/22 0818   methocarbamol (ROBAXIN) tablet 500 mg  500 mg Oral Q6H PRN Corinne Ports, PA-C   500 mg at 11/08/22 [id number]   Or   methocarbamol (ROBAXIN) 500 mg in  dextrose 5 % 50 mL IVPB  500 mg Intravenous Q6H PRN Corinne Ports, PA-C       metoCLOPramide (REGLAN) tablet 5-10 mg  5-10 mg Oral Q8H PRN Thereasa Solo, Sarah A, PA-C       Or   metoCLOPramide (REGLAN) injection 5-10 mg  5-10 mg Intravenous Q8H PRN Corinne Ports, PA-C       ondansetron (ZOFRAN) tablet 4 mg  4 mg Oral Q6H PRN Rushie Nyhan A, PA-C   4 mg at 11/05/22 1013   Or   ondansetron (ZOFRAN) injection 4 mg  4 mg Intravenous Q6H PRN Corinne Ports, PA-C  oxyCODONE (Oxy IR/ROXICODONE) immediate release tablet 10-15 mg  10-15 mg Oral Q4H PRN Corinne Ports, PA-C   10 mg at 11/07/22 2306   oxyCODONE (Oxy IR/ROXICODONE) immediate release tablet 5-10 mg  5-10 mg Oral Q4H PRN Corinne Ports, PA-C   5 mg at 11/07/22 [id number]   pantoprazole (PROTONIX) EC tablet 80 mg  80 mg Oral Daily Corinne Ports, PA-C   80 mg at 11/08/22 0818   polyethylene glycol (MIRALAX / GLYCOLAX) packet 17 g  17 g Oral Daily PRN Corinne Ports, PA-C       spironolactone (ALDACTONE) tablet 25 mg  25 mg Oral Daily Rushie Nyhan A, PA-C   25 mg at 11/08/22 0818   zolpidem (AMBIEN) tablet 5 mg  5 mg Oral QHS PRN Corinne Ports, PA-C   5 mg at 11/06/22 0028     Discharge Medications: Please see discharge summary for a list of discharge medications.  Relevant Imaging Results:  Relevant Lab Results:   Additional Information SS# [id number]  Joanne Chars, LCSW

## 2022-11-08 NOTE — Progress Notes (Signed)
Orthopaedic Trauma Progress Note  No acute events overnight.  Patient remained stable for discharge to SNF.  Awaiting insurance authorization.  OBJECTIVE:  Vitals:   11/08/22 0419 11/08/22 0720  BP: 128/65 (!) 138/59  Pulse: 85 86  Resp: 18   Temp: 98 F (36.7 C) 97.9 F (36.6 C)  SpO2: 95% 93%    IMAGING: Stable post op imaging.   LABS:  Results for orders placed or performed during the hospital encounter of 11/03/22 (from the past 24 hour(s))  Glucose, capillary     Status: Abnormal   Collection Time: 11/07/22 11:36 AM  Result Value Ref Range   Glucose-Capillary 65 (L) 70 - 99 mg/dL  Glucose, capillary     Status: None   Collection Time: 11/07/22 12:05 PM  Result Value Ref Range   Glucose-Capillary 75 70 - 99 mg/dL  Glucose, capillary     Status: Abnormal   Collection Time: 11/07/22  4:15 PM  Result Value Ref Range   Glucose-Capillary 135 (H) 70 - 99 mg/dL  Glucose, capillary     Status: Abnormal   Collection Time: 11/07/22  7:58 PM  Result Value Ref Range   Glucose-Capillary 111 (H) 70 - 99 mg/dL  Glucose, capillary     Status: Abnormal   Collection Time: 11/08/22  7:22 AM  Result Value Ref Range   Glucose-Capillary 111 (H) 70 - 99 mg/dL    ASSESSMENT: Cheryl Wallace is a 72 y.o. female, 5 Days Post-Op s/p OPEN REDUCTION INTERNAL FIXATION RIGHT TIBIAL PLATEAU  CV/Blood loss: Acute blood loss anemia, Hgb 8.8 on 11/06/2022.  Hemodynamically stable  PLAN: Weightbearing: NWB RLE ROM: Okay for unrestricted knee ROM as tolerated Incisional and dressing care: continue to change as needed Showering: Okay to begin showering getting incisions wet 11/07/2022 no drainage from incisions  Orthopedic device(s): None  Pain management:  1. Tylenol 1000 mg q 6 hours scheduled 2. Robaxin 500 mg q 6 hours PRN 3. Oxycodone 5-15 mg q 4 hours PRN 4. Dilaudid 0.5-1 mg q 4 hours PRN VTE prophylaxis: Lovenox, SCDs ID:  Ancef 2gm post op completed Foley/Lines:  No foley, KVO  IVFs Impediments to Fracture Healing: Diabetes.  Vitamin D level 64, no additional supplementation needed.  Dispo:  PT/OT evaluation ongoing, currently recommending SNF. TOC following and arranging bed placement at [facility]. OK for d/c once bed available and insurance authorization received.   D/C recommendations: -Oxycodone, Tylenol, and Robaxin for pain control -Aspirin 325 mg daily x 30 days for DVT prophylaxis -No need for additional Vit D supplementation  Follow - up plan: 2 weeks after discharge for wound check and repeat x-rays   Contact information:  Katha Hamming MD, Rushie Nyhan PA-C. After hours and holidays please check [email or web address] for group call information for [facility]   Gwinda Passe, PA-C [phone] (office) [email or web address]

## 2022-11-08 NOTE — Discharge Summary (Signed)
Orthopaedic Trauma Service (OTS) Discharge Summary   Patient ID: Cheryl Wallace MRN: [id number] DOB/AGE: 03-16-1951 71 y.o.  Admit date: 11/03/2022 Discharge date: 11/08/2022  Admission Diagnoses: Right tibial plateau fracture  Discharge Diagnoses:  Principal Problem:   Closed bicondylar fracture of right tibial plateau   Past Medical History:  Diagnosis Date   Acute meniscal tear, medial, right, sequela 09/22/2020   Allergic rhinitis    Anemia    iron deficiency   Arthritis    osteoarthritis right knee   Chronic kidney disease    nephropathy d/t diabetes   Complication of anesthesia 1981   While pt was under anesthesia, pt had an asthema attack during BTL due to having the flu   Depression, major, single episode, mild ([place]) 09/04/2016   Elbow tendonitis    a. bilat, s/p surgery.   Essential hypertension    Functional ovarian cysts 2011   a. s/p TAH (1994);  b. s/p oopherectomy (~2011)   Gallstones    GERD (gastroesophageal reflux disease)    History of kidney stones    Hyperlipidemia    Insomnia    Low grade fever    Obesity    Pneumonia    Polyarthralgia    Seasonal allergies    Seizure ([place])    last episode at 72 years old. nothing since. unknown etiology   Tinnitus    Type 2 diabetes mellitus ([place])    a.  Hemoglobin A1c in July 2016, 6.7.     Procedures Performed:  CPT [id number] reduction internal fixation of right bicondylar tibial plateau fracture CPT 27403-Repair of right lateral meniscus tear   Discharged Condition: stable  Hospital Course: Patient presented [facility] on 11/03/2022 for scheduled procedure right lower extremity.  Was seen in the operating room Dr. Doreatha Martin for the above procedure.  She tolerated well without complications.  Was admitted to orthopedic service postoperatively for pain control and therapies.  Was started on Lovenox for DVT prophylaxis General postoperative day 1.  Begin working with physical and  Occupational Therapy starting on postoperative day #1.  Therapies recommended patient received additional services at a skilled nursing facility.  Transition of care team was consulted to help facilitate this.  The remainder of patient's hospitalization was dedicated to achieving adequate pain control and increasing mobility in order for patient to safely discharge to rehab facility. On 11/08/2022, the patient was tolerating diet, working well with therapies, pain well controlled, vital signs stable, dressings clean, dry, intact and felt stable for discharge to SNF. Patient will follow up as below and knows to call with questions or concerns.     Consults: orthopedic surgery  Significant Diagnostic Studies:   Results for orders placed or performed during the hospital encounter of 11/03/22 (from the past 168 hour(s))  Glucose, capillary   Collection Time: 11/03/22  6:15 AM  Result Value Ref Range   Glucose-Capillary 168 (H) 70 - 99 mg/dL  Glucose, capillary   Collection Time: 11/03/22  9:09 AM  Result Value Ref Range   Glucose-Capillary 147 (H) 70 - 99 mg/dL   Comment 1 Notify RN    Comment 2 Document in Chart   Glucose, capillary   Collection Time: 11/03/22  1:01 PM  Result Value Ref Range   Glucose-Capillary 157 (H) 70 - 99 mg/dL  CBC   Collection Time: 11/03/22  2:29 PM  Result Value Ref Range   WBC 11.6 (H) 4.0 - 10.5 K/uL   RBC 3.39 (L) 3.87 - 5.11  MIL/uL   Hemoglobin 9.5 (L) 12.0 - 15.0 g/dL   HCT 29.6 (L) 36.0 - 46.0 %   MCV 87.3 80.0 - 100.0 fL   MCH 28.0 26.0 - 34.0 pg   MCHC 32.1 30.0 - 36.0 g/dL   RDW 14.5 11.5 - 15.5 %   Platelets 372 150 - 400 K/uL   nRBC 0.0 0.0 - 0.2 %  Creatinine, serum   Collection Time: 11/03/22  2:29 PM  Result Value Ref Range   Creatinine, Ser 0.99 0.44 - 1.00 mg/dL   GFR, Estimated >60 >60 mL/min  VITAMIN D 25 Hydroxy (Vit-D Deficiency, Fractures)   Collection Time: 11/03/22  2:29 PM  Result Value Ref Range   Vit D, 25-Hydroxy 64.66 30 -  100 ng/mL  Glucose, capillary   Collection Time: 11/03/22  4:53 PM  Result Value Ref Range   Glucose-Capillary 130 (H) 70 - 99 mg/dL  Glucose, capillary   Collection Time: 11/03/22  8:13 PM  Result Value Ref Range   Glucose-Capillary 116 (H) 70 - 99 mg/dL  CBC   Collection Time: 11/04/22  2:52 AM  Result Value Ref Range   WBC 10.6 (H) 4.0 - 10.5 K/uL   RBC 3.21 (L) 3.87 - 5.11 MIL/uL   Hemoglobin 9.0 (L) 12.0 - 15.0 g/dL   HCT 28.5 (L) 36.0 - 46.0 %   MCV 88.8 80.0 - 100.0 fL   MCH 28.0 26.0 - 34.0 pg   MCHC 31.6 30.0 - 36.0 g/dL   RDW 14.5 11.5 - 15.5 %   Platelets 362 150 - 400 K/uL   nRBC 0.0 0.0 - 0.2 %  Basic metabolic panel   Collection Time: 11/04/22  2:52 AM  Result Value Ref Range   Sodium 135 135 - 145 mmol/L   Potassium 4.3 3.5 - 5.1 mmol/L   Chloride 103 98 - 111 mmol/L   CO2 26 22 - 32 mmol/L   Glucose, Bld 109 (H) 70 - 99 mg/dL   BUN 17 8 - 23 mg/dL   Creatinine, Ser 0.94 0.44 - 1.00 mg/dL   Calcium 8.6 (L) 8.9 - 10.3 mg/dL   GFR, Estimated >60 >60 mL/min   Anion gap 6 5 - 15  Glucose, capillary   Collection Time: 11/04/22  7:35 AM  Result Value Ref Range   Glucose-Capillary 125 (H) 70 - 99 mg/dL  Glucose, capillary   Collection Time: 11/04/22 11:51 AM  Result Value Ref Range   Glucose-Capillary 90 70 - 99 mg/dL  Glucose, capillary   Collection Time: 11/04/22  4:05 PM  Result Value Ref Range   Glucose-Capillary 108 (H) 70 - 99 mg/dL  Glucose, capillary   Collection Time: 11/04/22  8:42 PM  Result Value Ref Range   Glucose-Capillary 125 (H) 70 - 99 mg/dL  CBC   Collection Time: 11/05/22  6:21 AM  Result Value Ref Range   WBC 9.7 4.0 - 10.5 K/uL   RBC 3.18 (L) 3.87 - 5.11 MIL/uL   Hemoglobin 8.6 (L) 12.0 - 15.0 g/dL   HCT 28.5 (L) 36.0 - 46.0 %   MCV 89.6 80.0 - 100.0 fL   MCH 27.0 26.0 - 34.0 pg   MCHC 30.2 30.0 - 36.0 g/dL   RDW 14.5 11.5 - 15.5 %   Platelets 366 150 - 400 K/uL   nRBC 0.0 0.0 - 0.2 %  Glucose, capillary   Collection Time:  11/05/22  7:49 AM  Result Value Ref Range   Glucose-Capillary 113 (H) 70 -  99 mg/dL  Glucose, capillary   Collection Time: 11/05/22 11:57 AM  Result Value Ref Range   Glucose-Capillary 93 70 - 99 mg/dL  Glucose, capillary   Collection Time: 11/05/22  5:10 PM  Result Value Ref Range   Glucose-Capillary 95 70 - 99 mg/dL  Glucose, capillary   Collection Time: 11/05/22 10:24 PM  Result Value Ref Range   Glucose-Capillary 83 70 - 99 mg/dL  CBC   Collection Time: 11/06/22  7:04 AM  Result Value Ref Range   WBC 8.2 4.0 - 10.5 K/uL   RBC 3.22 (L) 3.87 - 5.11 MIL/uL   Hemoglobin 8.8 (L) 12.0 - 15.0 g/dL   HCT 29.2 (L) 36.0 - 46.0 %   MCV 90.7 80.0 - 100.0 fL   MCH 27.3 26.0 - 34.0 pg   MCHC 30.1 30.0 - 36.0 g/dL   RDW 14.6 11.5 - 15.5 %   Platelets 364 150 - 400 K/uL   nRBC 0.2 0.0 - 0.2 %  Glucose, capillary   Collection Time: 11/06/22  7:48 AM  Result Value Ref Range   Glucose-Capillary 120 (H) 70 - 99 mg/dL  Glucose, capillary   Collection Time: 11/06/22 12:07 PM  Result Value Ref Range   Glucose-Capillary 140 (H) 70 - 99 mg/dL  Glucose, capillary   Collection Time: 11/06/22  4:24 PM  Result Value Ref Range   Glucose-Capillary 100 (H) 70 - 99 mg/dL  Glucose, capillary   Collection Time: 11/06/22  8:03 PM  Result Value Ref Range   Glucose-Capillary 95 70 - 99 mg/dL  Glucose, capillary   Collection Time: 11/07/22  7:29 AM  Result Value Ref Range   Glucose-Capillary 121 (H) 70 - 99 mg/dL  Glucose, capillary   Collection Time: 11/07/22 11:36 AM  Result Value Ref Range   Glucose-Capillary 65 (L) 70 - 99 mg/dL  Glucose, capillary   Collection Time: 11/07/22 12:05 PM  Result Value Ref Range   Glucose-Capillary 75 70 - 99 mg/dL  Glucose, capillary   Collection Time: 11/07/22  4:15 PM  Result Value Ref Range   Glucose-Capillary 135 (H) 70 - 99 mg/dL  Glucose, capillary   Collection Time: 11/07/22  7:58 PM  Result Value Ref Range   Glucose-Capillary 111 (H) 70 - 99  mg/dL  Glucose, capillary   Collection Time: 11/08/22  7:22 AM  Result Value Ref Range   Glucose-Capillary 111 (H) 70 - 99 mg/dL     Treatments: IV hydration, antibiotics: Ancef, analgesia: acetaminophen, Dilaudid, and oxycodone, cardiac meds: carvedilol, amlodipine, and Hydralazine, anticoagulation: LMW heparin, therapies: PT and OT, and surgery: as above  Discharge Exam: General: Sitting up in bed. No acute distress.   Respiratory: No increased work of breathing. RLE: Ace wrap over the knee is clean dry and intact.  Swelling about the knee and proximal tibia present but stable.  Tenderness over this area as expected.  Ankle DF/PF intact.  Endorses sensation of all aspects of the foot.  Able to wiggle the toes.  2+ DP pulse.  Foot warm well-perfused.  Disposition: Discharge disposition: 03-Skilled Nursing Facility       Discharge Instructions     Call MD / Call 911   Complete by: As directed    If you experience chest pain or shortness of breath, CALL 911 and be transported to the hospital emergency room.  If you develope a fever above 101 F, pus (white drainage) or increased drainage or redness at the wound, or calf pain, call your surgeon's  office.   Constipation Prevention   Complete by: As directed    Drink plenty of fluids.  Prune juice may be helpful.  You may use a stool softener, such as Colace (over the counter) 100 mg twice a day.  Use MiraLax (over the counter) for constipation as needed.   Diet - low sodium heart healthy   Complete by: As directed    Increase activity slowly as tolerated   Complete by: As directed    Post-operative opioid taper instructions:   Complete by: As directed    POST-OPERATIVE OPIOID TAPER INSTRUCTIONS: It is important to wean off of your opioid medication as soon as possible. If you do not need pain medication after your surgery it is ok to stop day one. Opioids include: Codeine, Hydrocodone(Norco, Vicodin), Oxycodone(Percocet, oxycontin)  and hydromorphone amongst others.  Long term and even short term use of opiods can cause: Increased pain response Dependence Constipation Depression Respiratory depression And more.  Withdrawal symptoms can include Flu like symptoms Nausea, vomiting And more Techniques to manage these symptoms Hydrate well Eat regular healthy meals Stay active Use relaxation techniques(deep breathing, meditating, yoga) Do Not substitute Alcohol to help with tapering If you have been on opioids for less than two weeks and do not have pain than it is ok to stop all together.  Plan to wean off of opioids This plan should start within one week post op of your joint replacement. Maintain the same interval or time between taking each dose and first decrease the dose.  Cut the total daily intake of opioids by one tablet each day Next start to increase the time between doses. The last dose that should be eliminated is the evening dose.         Allergies as of 11/08/2022       Reactions   Shellfish Allergy Swelling   Swelling of face, lips and hands. No difficulty breathing. No problems with topical betadine   Lisinopril Cough   cough   Ozempic (0.25 Or 0.5 Mg-dose) [semaglutide(0.25 Or 0.'5mg'$ -dos)] Nausea And Vomiting   Hctz [hydrochlorothiazide]    Muscle cramps   Penicillins Rash        Medication List     STOP taking these medications    doxycycline 100 MG tablet Commonly known as: VIBRA-TABS   tiZANidine 2 MG tablet Commonly known as: ZANAFLEX       TAKE these medications    acetaminophen 325 MG tablet Commonly known as: TYLENOL Take 1-2 tablets (325-650 mg total) by mouth every 6 (six) hours as needed for mild pain, fever or headache.   amLODipine 10 MG tablet Commonly known as: NORVASC Take 1 tablet (10 mg total) by mouth daily.   aspirin EC 325 MG tablet Take 1 tablet (325 mg total) by mouth daily.   atorvastatin 20 MG tablet Commonly known as: LIPITOR TAKE 1  TABLET BY MOUTH EVERY DAY   carvedilol 3.125 MG tablet Commonly known as: COREG TAKE 1 TABLET BY MOUTH TWICE A DAY WITH A MEAL   cholecalciferol 1000 units tablet Commonly known as: VITAMIN D Take 1,000 Units by mouth daily.   diphenhydrAMINE 50 MG tablet Commonly known as: BENADRYL Take 50 mg by mouth at bedtime as needed for allergies.   empagliflozin 25 MG Tabs tablet Commonly known as: Jardiance Take 1 tablet (25 mg total) by mouth daily.   EPINEPHrine 0.3 mg/0.3 mL Soaj injection Commonly known as: EPI-PEN Inject 0.3 mg into the muscle as needed for anaphylaxis.  Due to shellfish allergy   fexofenadine 180 MG tablet Commonly known as: ALLEGRA Take 180 mg by mouth every morning.   fluticasone 50 MCG/ACT nasal spray Commonly known as: FLONASE Place 1 spray into both nostrils daily as needed for allergies.   glipiZIDE 10 MG tablet Commonly known as: GLUCOTROL Take 0.5 tablets (5 mg total) by mouth 2 (two) times daily before a meal.   metFORMIN 1000 MG tablet Commonly known as: GLUCOPHAGE Take 1 tablet (1,000 mg total) by mouth 2 (two) times daily with a meal.   methocarbamol 500 MG tablet Commonly known as: ROBAXIN Take 1 tablet (500 mg total) by mouth every 6 (six) hours as needed for muscle spasms.   omeprazole 40 MG capsule Commonly known as: PRILOSEC TAKE 1 CAPSULE BY MOUTH EVERY DAY   Oxycodone HCl 10 MG Tabs Take 1 tablet (10 mg total) by mouth every 4 (four) hours as needed for severe pain.   spironolactone 25 MG tablet Commonly known as: ALDACTONE Take 1 tablet (25 mg total) by mouth daily.   valsartan 40 MG tablet Commonly known as: DIOVAN TAKE 1 TABLET BY MOUTH EVERYDAY AT BEDTIME   zolpidem 5 MG tablet Commonly known as: AMBIEN TAKE 1 TABLET BY MOUTH AT BEDTIME AS NEEDED FOR SLEEP        Follow-up Information     Haddix, Thomasene Lot, MD. Schedule an appointment as soon as possible for a visit in 2 week(s).   Specialty: Orthopedic  Surgery Why: for wound check and repeat x-rays Contact information: [address] [place] [address] [phone]                 Discharge Instructions and Plan: Patient will be discharged to SNF. Will be discharged on Aspirin for DVT prophylaxis. Patient has been provided with all the necessary DME for discharge. Patient will follow up with Dr. Doreatha Martin in 2 weeks for repeat x-rays and suture removal.   Signed:  Gwinda Passe, PA-C ?[phone] (phone) 11/08/2022, 10:45 AM  [facility] [place] [id number] [phone] Domingo Sep (F)

## 2022-11-08 NOTE — Care Management Important Message (Signed)
Important Message  Patient Details  Name: Cheryl Wallace MRN: [id number] Date of Birth: Jan 13, 1951   Medicare Important Message Given:  Yes     Hannah Beat 11/08/2022, 12:01 PM

## 2022-11-08 NOTE — Plan of Care (Signed)
Problem: Education: Goal: Ability to describe self-care measures that may prevent or decrease complications (Diabetes Survival Skills Education) will improve Outcome: Adequate for Discharge Goal: Individualized Educational Video(s) Outcome: Adequate for Discharge   Problem: Coping: Goal: Ability to adjust to condition or change in health will improve Outcome: Adequate for Discharge   Problem: Fluid Volume: Goal: Ability to maintain a balanced intake and output will improve Outcome: Adequate for Discharge   Problem: Health Behavior/Discharge Planning: Goal: Ability to identify and utilize available resources and services will improve Outcome: Adequate for Discharge Goal: Ability to manage health-related needs will improve Outcome: Adequate for Discharge   Problem: Metabolic: Goal: Ability to maintain appropriate glucose levels will improve Outcome: Adequate for Discharge   Problem: Nutritional: Goal: Maintenance of adequate nutrition will improve Outcome: Adequate for Discharge Goal: Progress toward achieving an optimal weight will improve Outcome: Adequate for Discharge   Problem: Skin Integrity: Goal: Risk for impaired skin integrity will decrease Outcome: Adequate for Discharge   Problem: Tissue Perfusion: Goal: Adequacy of tissue perfusion will improve Outcome: Adequate for Discharge   Problem: Education: Goal: Knowledge of General Education information will improve Description: Including pain rating scale, medication(s)/side effects and non-pharmacologic comfort measures Outcome: Adequate for Discharge   Problem: Health Behavior/Discharge Planning: Goal: Ability to manage health-related needs will improve Outcome: Adequate for Discharge   Problem: Clinical Measurements: Goal: Ability to maintain clinical measurements within normal limits will improve Outcome: Adequate for Discharge Goal: Will remain free from infection Outcome: Adequate for Discharge Goal:  Diagnostic test results will improve Outcome: Adequate for Discharge Goal: Respiratory complications will improve Outcome: Adequate for Discharge Goal: Cardiovascular complication will be avoided Outcome: Adequate for Discharge   Problem: Activity: Goal: Risk for activity intolerance will decrease Outcome: Adequate for Discharge   Problem: Nutrition: Goal: Adequate nutrition will be maintained Outcome: Adequate for Discharge   Problem: Coping: Goal: Level of anxiety will decrease Outcome: Adequate for Discharge   Problem: Elimination: Goal: Will not experience complications related to bowel motility Outcome: Adequate for Discharge Goal: Will not experience complications related to urinary retention Outcome: Adequate for Discharge   Problem: Pain Managment: Goal: General experience of comfort will improve Outcome: Adequate for Discharge   Problem: Safety: Goal: Ability to remain free from injury will improve Outcome: Adequate for Discharge   Problem: Skin Integrity: Goal: Risk for impaired skin integrity will decrease Outcome: Adequate for Discharge   Problem: Education: Goal: Knowledge of General Education information will improve Description: Including pain rating scale, medication(s)/side effects and non-pharmacologic comfort measures Outcome: Adequate for Discharge   Problem: Health Behavior/Discharge Planning: Goal: Ability to manage health-related needs will improve Outcome: Adequate for Discharge   Problem: Clinical Measurements: Goal: Ability to maintain clinical measurements within normal limits will improve Outcome: Adequate for Discharge Goal: Will remain free from infection Outcome: Adequate for Discharge Goal: Diagnostic test results will improve Outcome: Adequate for Discharge Goal: Respiratory complications will improve Outcome: Adequate for Discharge Goal: Cardiovascular complication will be avoided Outcome: Adequate for Discharge   Problem:  Activity: Goal: Risk for activity intolerance will decrease Outcome: Adequate for Discharge   Problem: Nutrition: Goal: Adequate nutrition will be maintained Outcome: Adequate for Discharge   Problem: Coping: Goal: Level of anxiety will decrease Outcome: Adequate for Discharge   Problem: Elimination: Goal: Will not experience complications related to bowel motility Outcome: Adequate for Discharge Goal: Will not experience complications related to urinary retention Outcome: Adequate for Discharge   Problem: Pain Managment: Goal: General experience of comfort  will improve Outcome: Adequate for Discharge   Problem: Safety: Goal: Ability to remain free from injury will improve Outcome: Adequate for Discharge   Problem: Skin Integrity: Goal: Risk for impaired skin integrity will decrease Outcome: Adequate for Discharge
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
## 2022-11-09 DIAGNOSIS — E1121 Type 2 diabetes mellitus with diabetic nephropathy: Secondary | ICD-10-CM | POA: Diagnosis not present

## 2022-11-09 DIAGNOSIS — M19042 Primary osteoarthritis, left hand: Secondary | ICD-10-CM | POA: Diagnosis not present

## 2022-11-09 DIAGNOSIS — G4700 Insomnia, unspecified: Secondary | ICD-10-CM | POA: Diagnosis not present

## 2022-11-09 DIAGNOSIS — I10 Essential (primary) hypertension: Secondary | ICD-10-CM | POA: Diagnosis not present

## 2022-11-11 DIAGNOSIS — S82141D Displaced bicondylar fracture of right tibia, subsequent encounter for closed fracture with routine healing: Secondary | ICD-10-CM | POA: Diagnosis not present

## 2022-11-11 DIAGNOSIS — R2689 Other abnormalities of gait and mobility: Secondary | ICD-10-CM | POA: Diagnosis not present

## 2022-11-11 DIAGNOSIS — R2681 Unsteadiness on feet: Secondary | ICD-10-CM | POA: Diagnosis not present

## 2022-11-11 DIAGNOSIS — M6281 Muscle weakness (generalized): Secondary | ICD-10-CM | POA: Diagnosis not present

## 2022-11-12 DIAGNOSIS — E1121 Type 2 diabetes mellitus with diabetic nephropathy: Secondary | ICD-10-CM | POA: Diagnosis not present

## 2022-11-12 DIAGNOSIS — S82141D Displaced bicondylar fracture of right tibia, subsequent encounter for closed fracture with routine healing: Secondary | ICD-10-CM | POA: Diagnosis not present

## 2022-11-12 DIAGNOSIS — G4700 Insomnia, unspecified: Secondary | ICD-10-CM | POA: Diagnosis not present

## 2022-11-12 DIAGNOSIS — I10 Essential (primary) hypertension: Secondary | ICD-10-CM | POA: Diagnosis not present

## 2022-11-15 DIAGNOSIS — R2681 Unsteadiness on feet: Secondary | ICD-10-CM | POA: Diagnosis not present

## 2022-11-15 DIAGNOSIS — I10 Essential (primary) hypertension: Secondary | ICD-10-CM | POA: Diagnosis not present

## 2022-11-15 DIAGNOSIS — E1165 Type 2 diabetes mellitus with hyperglycemia: Secondary | ICD-10-CM | POA: Diagnosis not present

## 2022-11-15 DIAGNOSIS — R2689 Other abnormalities of gait and mobility: Secondary | ICD-10-CM | POA: Diagnosis not present

## 2022-11-15 DIAGNOSIS — M6281 Muscle weakness (generalized): Secondary | ICD-10-CM | POA: Diagnosis not present

## 2022-11-15 DIAGNOSIS — S82141D Displaced bicondylar fracture of right tibia, subsequent encounter for closed fracture with routine healing: Secondary | ICD-10-CM | POA: Diagnosis not present

## 2022-11-18 DIAGNOSIS — S82141D Displaced bicondylar fracture of right tibia, subsequent encounter for closed fracture with routine healing: Secondary | ICD-10-CM | POA: Diagnosis not present

## 2022-11-18 DIAGNOSIS — R2681 Unsteadiness on feet: Secondary | ICD-10-CM | POA: Diagnosis not present

## 2022-11-18 DIAGNOSIS — R2689 Other abnormalities of gait and mobility: Secondary | ICD-10-CM | POA: Diagnosis not present

## 2022-11-18 DIAGNOSIS — M6281 Muscle weakness (generalized): Secondary | ICD-10-CM | POA: Diagnosis not present

## 2022-11-22 DIAGNOSIS — I10 Essential (primary) hypertension: Secondary | ICD-10-CM | POA: Diagnosis not present

## 2022-11-22 DIAGNOSIS — M6281 Muscle weakness (generalized): Secondary | ICD-10-CM | POA: Diagnosis not present

## 2022-11-22 DIAGNOSIS — E1165 Type 2 diabetes mellitus with hyperglycemia: Secondary | ICD-10-CM | POA: Diagnosis not present

## 2022-11-22 DIAGNOSIS — S82141D Displaced bicondylar fracture of right tibia, subsequent encounter for closed fracture with routine healing: Secondary | ICD-10-CM | POA: Diagnosis not present

## 2022-11-22 DIAGNOSIS — R2681 Unsteadiness on feet: Secondary | ICD-10-CM | POA: Diagnosis not present

## 2022-11-22 DIAGNOSIS — R609 Edema, unspecified: Secondary | ICD-10-CM | POA: Diagnosis not present

## 2022-11-22 DIAGNOSIS — R2689 Other abnormalities of gait and mobility: Secondary | ICD-10-CM | POA: Diagnosis not present

## 2022-11-23 DIAGNOSIS — S82141D Displaced bicondylar fracture of right tibia, subsequent encounter for closed fracture with routine healing: Secondary | ICD-10-CM | POA: Diagnosis not present

## 2022-11-23 DIAGNOSIS — S83281D Other tear of lateral meniscus, current injury, right knee, subsequent encounter: Secondary | ICD-10-CM | POA: Diagnosis not present

## 2022-11-25 DIAGNOSIS — R2681 Unsteadiness on feet: Secondary | ICD-10-CM | POA: Diagnosis not present

## 2022-11-25 DIAGNOSIS — S82141D Displaced bicondylar fracture of right tibia, subsequent encounter for closed fracture with routine healing: Secondary | ICD-10-CM | POA: Diagnosis not present

## 2022-11-25 DIAGNOSIS — M6281 Muscle weakness (generalized): Secondary | ICD-10-CM | POA: Diagnosis not present

## 2022-11-25 DIAGNOSIS — R2689 Other abnormalities of gait and mobility: Secondary | ICD-10-CM | POA: Diagnosis not present

## 2022-11-29 DIAGNOSIS — M6281 Muscle weakness (generalized): Secondary | ICD-10-CM | POA: Diagnosis not present

## 2022-11-29 DIAGNOSIS — R2681 Unsteadiness on feet: Secondary | ICD-10-CM | POA: Diagnosis not present

## 2022-11-29 DIAGNOSIS — S82141D Displaced bicondylar fracture of right tibia, subsequent encounter for closed fracture with routine healing: Secondary | ICD-10-CM | POA: Diagnosis not present

## 2022-11-29 DIAGNOSIS — R609 Edema, unspecified: Secondary | ICD-10-CM | POA: Diagnosis not present

## 2022-11-29 DIAGNOSIS — R2689 Other abnormalities of gait and mobility: Secondary | ICD-10-CM | POA: Diagnosis not present

## 2022-11-29 DIAGNOSIS — E1165 Type 2 diabetes mellitus with hyperglycemia: Secondary | ICD-10-CM | POA: Diagnosis not present

## 2022-12-02 ENCOUNTER — Telehealth: Payer: Self-pay | Admitting: Internal Medicine

## 2022-12-02 DIAGNOSIS — S82141D Displaced bicondylar fracture of right tibia, subsequent encounter for closed fracture with routine healing: Secondary | ICD-10-CM | POA: Diagnosis not present

## 2022-12-02 DIAGNOSIS — M6281 Muscle weakness (generalized): Secondary | ICD-10-CM | POA: Diagnosis not present

## 2022-12-02 DIAGNOSIS — R2681 Unsteadiness on feet: Secondary | ICD-10-CM | POA: Diagnosis not present

## 2022-12-02 DIAGNOSIS — R2689 Other abnormalities of gait and mobility: Secondary | ICD-10-CM | POA: Diagnosis not present

## 2022-12-02 NOTE — Telephone Encounter (Signed)
Camille from [facility] called to schedule pt an hospital follow-up. Pt scheduled for 4/8 at 11

## 2022-12-03 ENCOUNTER — Other Ambulatory Visit: Payer: Self-pay | Admitting: Internal Medicine

## 2022-12-03 DIAGNOSIS — E785 Hyperlipidemia, unspecified: Secondary | ICD-10-CM | POA: Diagnosis not present

## 2022-12-03 DIAGNOSIS — E1165 Type 2 diabetes mellitus with hyperglycemia: Secondary | ICD-10-CM | POA: Diagnosis not present

## 2022-12-03 DIAGNOSIS — I10 Essential (primary) hypertension: Secondary | ICD-10-CM | POA: Diagnosis not present

## 2022-12-03 DIAGNOSIS — M6281 Muscle weakness (generalized): Secondary | ICD-10-CM | POA: Diagnosis not present

## 2022-12-06 DIAGNOSIS — M19042 Primary osteoarthritis, left hand: Secondary | ICD-10-CM | POA: Diagnosis not present

## 2022-12-06 DIAGNOSIS — N189 Chronic kidney disease, unspecified: Secondary | ICD-10-CM | POA: Diagnosis not present

## 2022-12-06 DIAGNOSIS — R569 Unspecified convulsions: Secondary | ICD-10-CM | POA: Diagnosis not present

## 2022-12-06 DIAGNOSIS — S82141D Displaced bicondylar fracture of right tibia, subsequent encounter for closed fracture with routine healing: Secondary | ICD-10-CM | POA: Diagnosis not present

## 2022-12-06 DIAGNOSIS — I2510 Atherosclerotic heart disease of native coronary artery without angina pectoris: Secondary | ICD-10-CM | POA: Diagnosis not present

## 2022-12-06 DIAGNOSIS — I1310 Hypertensive heart and chronic kidney disease without heart failure, with stage 1 through stage 4 chronic kidney disease, or unspecified chronic kidney disease: Secondary | ICD-10-CM | POA: Diagnosis not present

## 2022-12-06 DIAGNOSIS — G4700 Insomnia, unspecified: Secondary | ICD-10-CM | POA: Diagnosis not present

## 2022-12-06 DIAGNOSIS — E1122 Type 2 diabetes mellitus with diabetic chronic kidney disease: Secondary | ICD-10-CM | POA: Diagnosis not present

## 2022-12-06 DIAGNOSIS — M19041 Primary osteoarthritis, right hand: Secondary | ICD-10-CM | POA: Diagnosis not present

## 2022-12-07 DIAGNOSIS — I2510 Atherosclerotic heart disease of native coronary artery without angina pectoris: Secondary | ICD-10-CM | POA: Diagnosis not present

## 2022-12-07 DIAGNOSIS — R569 Unspecified convulsions: Secondary | ICD-10-CM | POA: Diagnosis not present

## 2022-12-07 DIAGNOSIS — S82141D Displaced bicondylar fracture of right tibia, subsequent encounter for closed fracture with routine healing: Secondary | ICD-10-CM | POA: Diagnosis not present

## 2022-12-07 DIAGNOSIS — E1122 Type 2 diabetes mellitus with diabetic chronic kidney disease: Secondary | ICD-10-CM | POA: Diagnosis not present

## 2022-12-07 DIAGNOSIS — I1310 Hypertensive heart and chronic kidney disease without heart failure, with stage 1 through stage 4 chronic kidney disease, or unspecified chronic kidney disease: Secondary | ICD-10-CM | POA: Diagnosis not present

## 2022-12-07 DIAGNOSIS — N189 Chronic kidney disease, unspecified: Secondary | ICD-10-CM | POA: Diagnosis not present

## 2022-12-07 DIAGNOSIS — M19041 Primary osteoarthritis, right hand: Secondary | ICD-10-CM | POA: Diagnosis not present

## 2022-12-07 DIAGNOSIS — M19042 Primary osteoarthritis, left hand: Secondary | ICD-10-CM | POA: Diagnosis not present

## 2022-12-07 DIAGNOSIS — G4700 Insomnia, unspecified: Secondary | ICD-10-CM | POA: Diagnosis not present

## 2022-12-13 ENCOUNTER — Encounter: Payer: Self-pay | Admitting: Internal Medicine

## 2022-12-13 ENCOUNTER — Ambulatory Visit: Payer: Medicare PPO | Admitting: Internal Medicine

## 2022-12-13 ENCOUNTER — Telehealth: Payer: Self-pay

## 2022-12-13 VITALS — BP 138/66 | HR 107 | Temp 98.2°F | Ht 65.0 in | Wt 202.0 lb

## 2022-12-13 DIAGNOSIS — Z09 Encounter for follow-up examination after completed treatment for conditions other than malignant neoplasm: Secondary | ICD-10-CM | POA: Diagnosis not present

## 2022-12-13 DIAGNOSIS — I10 Essential (primary) hypertension: Secondary | ICD-10-CM

## 2022-12-13 DIAGNOSIS — E559 Vitamin D deficiency, unspecified: Secondary | ICD-10-CM

## 2022-12-13 DIAGNOSIS — E1121 Type 2 diabetes mellitus with diabetic nephropathy: Secondary | ICD-10-CM | POA: Diagnosis not present

## 2022-12-13 DIAGNOSIS — E785 Hyperlipidemia, unspecified: Secondary | ICD-10-CM | POA: Diagnosis not present

## 2022-12-13 DIAGNOSIS — D62 Acute posthemorrhagic anemia: Secondary | ICD-10-CM | POA: Insufficient documentation

## 2022-12-13 DIAGNOSIS — I358 Other nonrheumatic aortic valve disorders: Secondary | ICD-10-CM

## 2022-12-13 DIAGNOSIS — R011 Cardiac murmur, unspecified: Secondary | ICD-10-CM

## 2022-12-13 DIAGNOSIS — E1169 Type 2 diabetes mellitus with other specified complication: Secondary | ICD-10-CM

## 2022-12-13 DIAGNOSIS — D508 Other iron deficiency anemias: Secondary | ICD-10-CM

## 2022-12-13 LAB — HEMOGLOBIN A1C: Hgb A1c MFr Bld: 5.7 % (ref 4.6–6.5)

## 2022-12-13 LAB — LIPID PANEL
Cholesterol: 88 mg/dL (ref 0–200)
HDL: 41.1 mg/dL (ref >39.00)
LDL Cholesterol: 13 mg/dL (ref 0–99)
NonHDL: 47.25
Total CHOL/HDL Ratio: 2
Triglycerides: 172 mg/dL — ABNORMAL HIGH (ref 0.0–149.0)
VLDL: 34.4 mg/dL (ref 0.0–40.0)

## 2022-12-13 LAB — CBC WITH DIFFERENTIAL/PLATELET
Basophils Absolute: 0.1 10*3/uL (ref 0.0–0.1)
Basophils Relative: 0.6 % (ref 0.0–3.0)
Eosinophils Absolute: 1 10*3/uL — ABNORMAL HIGH (ref 0.0–0.7)
Eosinophils Relative: 11.3 % — ABNORMAL HIGH (ref 0.0–5.0)
HCT: 39.3 % (ref 36.0–46.0)
Hemoglobin: 12.3 g/dL (ref 12.0–15.0)
Lymphocytes Relative: 28.2 % (ref 12.0–46.0)
Lymphs Abs: 2.5 10*3/uL (ref 0.7–4.0)
MCHC: 31.2 g/dL (ref 30.0–36.0)
MCV: 84.1 fl (ref 78.0–100.0)
Monocytes Absolute: 0.6 10*3/uL (ref 0.1–1.0)
Monocytes Relative: 6.2 % (ref 3.0–12.0)
Neutro Abs: 4.8 10*3/uL (ref 1.4–7.7)
Neutrophils Relative %: 53.7 % (ref 43.0–77.0)
Platelets: 327 10*3/uL (ref 150.0–400.0)
RBC: 4.68 Mil/uL (ref 3.87–5.11)
RDW: 15.8 % — ABNORMAL HIGH (ref 11.5–15.5)
WBC: 8.9 10*3/uL (ref 4.0–10.5)

## 2022-12-13 LAB — COMPREHENSIVE METABOLIC PANEL
ALT: 11 U/L (ref 0–35)
AST: 13 U/L (ref 0–37)
Albumin: 3.9 g/dL (ref 3.5–5.2)
Alkaline Phosphatase: 83 U/L (ref 39–117)
BUN: 15 mg/dL (ref 6–23)
CO2: 20 mEq/L (ref 19–32)
Calcium: 9.4 mg/dL (ref 8.4–10.5)
Chloride: 111 mEq/L (ref 96–112)
Creatinine, Ser: 0.93 mg/dL (ref 0.40–1.20)
GFR: 62.44 mL/min (ref >60.00)
Glucose, Bld: 75 mg/dL (ref 70–99)
Potassium: 4.5 mEq/L (ref 3.5–5.1)
Sodium: 142 mEq/L (ref 135–145)
Total Bilirubin: 0.4 mg/dL (ref 0.2–1.2)
Total Protein: 6.3 g/dL (ref 6.0–8.3)

## 2022-12-13 LAB — VITAMIN D 25 HYDROXY (VIT D DEFICIENCY, FRACTURES): VITD: 57.99 ng/mL (ref 30.00–100.00)

## 2022-12-13 MED ORDER — HYDROCODONE-ACETAMINOPHEN 10-325 MG PO TABS: 1.0000 | ORAL_TABLET | Freq: Four times a day (QID) | ORAL | 0 refills | Status: AC | PRN

## 2022-12-13 NOTE — Assessment & Plan Note (Addendum)
She had a  drop in hgb from 12 on Feb 20  to 9.5 on Feb 28 and then to 8.8 march 2   Repeat hgb is now normal .  Iron studies pending    Lab Results  Component Value Date   HGB 12.3 12/13/2022    Lab Results  Component Value Date   IRON 71 10/13/2022   TIBC 386 10/13/2022   FERRITIN 45 10/13/2022

## 2022-12-13 NOTE — Patient Instructions (Addendum)
I have sent an Rx for Hydrocodone   to pharmacy for #28 tablets.  ONCE A DAY AT NIGHT  Cardiology referral for evaluation of heart murmur. NOT URGENT

## 2022-12-13 NOTE — Assessment & Plan Note (Signed)
Asymptomatic,  sending to cardiology for evaluation .

## 2022-12-13 NOTE — Assessment & Plan Note (Signed)
Patient is stable post discharge and has no new issues or questions about discharge plans at the visit today for hospital follow up. All labs , imaging studies and progress notes from admission were reviewed with patient today   

## 2022-12-13 NOTE — Assessment & Plan Note (Signed)
Resolved with iv iron infusion.  Lab Results  Component Value Date   WBC 8.9 12/13/2022   HGB 12.3 12/13/2022   HCT 39.3 12/13/2022   MCV 84.1 12/13/2022   PLT 327.0 12/13/2022

## 2022-12-13 NOTE — Telephone Encounter (Signed)
When would you like for pt to follow up?
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
## 2022-12-13 NOTE — Assessment & Plan Note (Signed)
Currently well-controlled on current medications . However [place] Pepper has been discontinued by patient cue to dost.  She will continue metfomrin and glizpide,  [facility] pharmacy consult has been ordered to avail patient of any assistance programs for SGLT 2 inhibitors. Patient is tolerating statin therapy for CAD risk reduction and on ACE/ARB for renal protection and hypertension   Lab Results  Component Value Date   HGBA1C 5.7 12/13/2022   Lab Results  Component Value Date   MICROALBUR 6.1 (H) 11/20/2021   MICROALBUR 6.2 (H) 07/17/2020

## 2022-12-13 NOTE — Telephone Encounter (Signed)
Spoke with pt and scheduled her for 3 month follow up

## 2022-12-13 NOTE — Telephone Encounter (Signed)
Patient states at check-out that she does not need the appointment next week, but she will need a follow-up visit with Dr. Duncan Dull.  We do not have check-out instructions for her visit today, so I let her know that I will check with Dr. Darrick Huntsman to see how she would like to schedule.

## 2022-12-14 ENCOUNTER — Other Ambulatory Visit: Payer: Self-pay | Admitting: Internal Medicine

## 2022-12-14 DIAGNOSIS — S82141D Displaced bicondylar fracture of right tibia, subsequent encounter for closed fracture with routine healing: Secondary | ICD-10-CM | POA: Diagnosis not present

## 2022-12-14 DIAGNOSIS — Z87891 Personal history of nicotine dependence: Secondary | ICD-10-CM

## 2022-12-14 DIAGNOSIS — E1122 Type 2 diabetes mellitus with diabetic chronic kidney disease: Secondary | ICD-10-CM | POA: Diagnosis not present

## 2022-12-14 DIAGNOSIS — Z7982 Long term (current) use of aspirin: Secondary | ICD-10-CM

## 2022-12-14 DIAGNOSIS — R569 Unspecified convulsions: Secondary | ICD-10-CM | POA: Diagnosis not present

## 2022-12-14 DIAGNOSIS — N189 Chronic kidney disease, unspecified: Secondary | ICD-10-CM | POA: Diagnosis not present

## 2022-12-14 DIAGNOSIS — Z5982 Transportation insecurity: Secondary | ICD-10-CM

## 2022-12-14 DIAGNOSIS — Z9049 Acquired absence of other specified parts of digestive tract: Secondary | ICD-10-CM

## 2022-12-14 DIAGNOSIS — Z9181 History of falling: Secondary | ICD-10-CM

## 2022-12-14 DIAGNOSIS — G4700 Insomnia, unspecified: Secondary | ICD-10-CM | POA: Diagnosis not present

## 2022-12-14 DIAGNOSIS — Z7984 Long term (current) use of oral hypoglycemic drugs: Secondary | ICD-10-CM

## 2022-12-14 DIAGNOSIS — M19042 Primary osteoarthritis, left hand: Secondary | ICD-10-CM | POA: Diagnosis not present

## 2022-12-14 DIAGNOSIS — K219 Gastro-esophageal reflux disease without esophagitis: Secondary | ICD-10-CM

## 2022-12-14 DIAGNOSIS — I1310 Hypertensive heart and chronic kidney disease without heart failure, with stage 1 through stage 4 chronic kidney disease, or unspecified chronic kidney disease: Secondary | ICD-10-CM | POA: Diagnosis not present

## 2022-12-14 DIAGNOSIS — M19041 Primary osteoarthritis, right hand: Secondary | ICD-10-CM | POA: Diagnosis not present

## 2022-12-14 DIAGNOSIS — I2510 Atherosclerotic heart disease of native coronary artery without angina pectoris: Secondary | ICD-10-CM | POA: Diagnosis not present

## 2022-12-14 LAB — IRON,TIBC AND FERRITIN PANEL
%SAT: 13 % (calc) — ABNORMAL LOW (ref 16–45)
Ferritin: 48 ng/mL (ref 16–288)
Iron: 47 ug/dL (ref 45–160)
TIBC: 354 mcg/dL (calc) (ref 250–450)

## 2022-12-21 DIAGNOSIS — S83281D Other tear of lateral meniscus, current injury, right knee, subsequent encounter: Secondary | ICD-10-CM | POA: Diagnosis not present

## 2022-12-21 DIAGNOSIS — S82141D Displaced bicondylar fracture of right tibia, subsequent encounter for closed fracture with routine healing: Secondary | ICD-10-CM | POA: Diagnosis not present

## 2022-12-22 DIAGNOSIS — I1310 Hypertensive heart and chronic kidney disease without heart failure, with stage 1 through stage 4 chronic kidney disease, or unspecified chronic kidney disease: Secondary | ICD-10-CM | POA: Diagnosis not present

## 2022-12-22 DIAGNOSIS — S82141D Displaced bicondylar fracture of right tibia, subsequent encounter for closed fracture with routine healing: Secondary | ICD-10-CM | POA: Diagnosis not present

## 2022-12-22 DIAGNOSIS — I2510 Atherosclerotic heart disease of native coronary artery without angina pectoris: Secondary | ICD-10-CM | POA: Diagnosis not present

## 2022-12-22 DIAGNOSIS — N189 Chronic kidney disease, unspecified: Secondary | ICD-10-CM | POA: Diagnosis not present

## 2022-12-22 DIAGNOSIS — R569 Unspecified convulsions: Secondary | ICD-10-CM | POA: Diagnosis not present

## 2022-12-22 DIAGNOSIS — M19042 Primary osteoarthritis, left hand: Secondary | ICD-10-CM | POA: Diagnosis not present

## 2022-12-22 DIAGNOSIS — M19041 Primary osteoarthritis, right hand: Secondary | ICD-10-CM | POA: Diagnosis not present

## 2022-12-22 DIAGNOSIS — G4700 Insomnia, unspecified: Secondary | ICD-10-CM | POA: Diagnosis not present

## 2022-12-22 DIAGNOSIS — E1122 Type 2 diabetes mellitus with diabetic chronic kidney disease: Secondary | ICD-10-CM | POA: Diagnosis not present

## 2022-12-24 ENCOUNTER — Ambulatory Visit: Payer: BC Managed Care – PPO | Admitting: Internal Medicine

## 2022-12-24 DIAGNOSIS — R569 Unspecified convulsions: Secondary | ICD-10-CM | POA: Diagnosis not present

## 2022-12-24 DIAGNOSIS — N189 Chronic kidney disease, unspecified: Secondary | ICD-10-CM | POA: Diagnosis not present

## 2022-12-24 DIAGNOSIS — G4700 Insomnia, unspecified: Secondary | ICD-10-CM | POA: Diagnosis not present

## 2022-12-24 DIAGNOSIS — I1310 Hypertensive heart and chronic kidney disease without heart failure, with stage 1 through stage 4 chronic kidney disease, or unspecified chronic kidney disease: Secondary | ICD-10-CM | POA: Diagnosis not present

## 2022-12-24 DIAGNOSIS — M19041 Primary osteoarthritis, right hand: Secondary | ICD-10-CM | POA: Diagnosis not present

## 2022-12-24 DIAGNOSIS — M19042 Primary osteoarthritis, left hand: Secondary | ICD-10-CM | POA: Diagnosis not present

## 2022-12-24 DIAGNOSIS — S82141D Displaced bicondylar fracture of right tibia, subsequent encounter for closed fracture with routine healing: Secondary | ICD-10-CM | POA: Diagnosis not present

## 2022-12-24 DIAGNOSIS — I2510 Atherosclerotic heart disease of native coronary artery without angina pectoris: Secondary | ICD-10-CM | POA: Diagnosis not present

## 2022-12-24 DIAGNOSIS — E1122 Type 2 diabetes mellitus with diabetic chronic kidney disease: Secondary | ICD-10-CM | POA: Diagnosis not present

## 2022-12-27 DIAGNOSIS — G4700 Insomnia, unspecified: Secondary | ICD-10-CM | POA: Diagnosis not present

## 2022-12-27 DIAGNOSIS — E1122 Type 2 diabetes mellitus with diabetic chronic kidney disease: Secondary | ICD-10-CM | POA: Diagnosis not present

## 2022-12-27 DIAGNOSIS — M19041 Primary osteoarthritis, right hand: Secondary | ICD-10-CM | POA: Diagnosis not present

## 2022-12-27 DIAGNOSIS — I1310 Hypertensive heart and chronic kidney disease without heart failure, with stage 1 through stage 4 chronic kidney disease, or unspecified chronic kidney disease: Secondary | ICD-10-CM | POA: Diagnosis not present

## 2022-12-27 DIAGNOSIS — I2510 Atherosclerotic heart disease of native coronary artery without angina pectoris: Secondary | ICD-10-CM | POA: Diagnosis not present

## 2022-12-27 DIAGNOSIS — M19042 Primary osteoarthritis, left hand: Secondary | ICD-10-CM | POA: Diagnosis not present

## 2022-12-27 DIAGNOSIS — R569 Unspecified convulsions: Secondary | ICD-10-CM | POA: Diagnosis not present

## 2022-12-27 DIAGNOSIS — N189 Chronic kidney disease, unspecified: Secondary | ICD-10-CM | POA: Diagnosis not present

## 2022-12-27 DIAGNOSIS — S82141D Displaced bicondylar fracture of right tibia, subsequent encounter for closed fracture with routine healing: Secondary | ICD-10-CM | POA: Diagnosis not present

## 2023-01-01 ENCOUNTER — Other Ambulatory Visit: Payer: Self-pay | Admitting: Internal Medicine

## 2023-01-03 DIAGNOSIS — M19041 Primary osteoarthritis, right hand: Secondary | ICD-10-CM | POA: Diagnosis not present

## 2023-01-03 DIAGNOSIS — I2510 Atherosclerotic heart disease of native coronary artery without angina pectoris: Secondary | ICD-10-CM | POA: Diagnosis not present

## 2023-01-03 DIAGNOSIS — G4700 Insomnia, unspecified: Secondary | ICD-10-CM | POA: Diagnosis not present

## 2023-01-03 DIAGNOSIS — R569 Unspecified convulsions: Secondary | ICD-10-CM | POA: Diagnosis not present

## 2023-01-03 DIAGNOSIS — S82141D Displaced bicondylar fracture of right tibia, subsequent encounter for closed fracture with routine healing: Secondary | ICD-10-CM | POA: Diagnosis not present

## 2023-01-03 DIAGNOSIS — E1122 Type 2 diabetes mellitus with diabetic chronic kidney disease: Secondary | ICD-10-CM | POA: Diagnosis not present

## 2023-01-03 DIAGNOSIS — N189 Chronic kidney disease, unspecified: Secondary | ICD-10-CM | POA: Diagnosis not present

## 2023-01-03 DIAGNOSIS — M19042 Primary osteoarthritis, left hand: Secondary | ICD-10-CM | POA: Diagnosis not present

## 2023-01-03 DIAGNOSIS — I1310 Hypertensive heart and chronic kidney disease without heart failure, with stage 1 through stage 4 chronic kidney disease, or unspecified chronic kidney disease: Secondary | ICD-10-CM | POA: Diagnosis not present

## 2023-01-03 NOTE — Telephone Encounter (Signed)
Medication was discontinued at discharge is it okay to refuse?

## 2023-01-10 DIAGNOSIS — G4700 Insomnia, unspecified: Secondary | ICD-10-CM | POA: Diagnosis not present

## 2023-01-10 DIAGNOSIS — I1310 Hypertensive heart and chronic kidney disease without heart failure, with stage 1 through stage 4 chronic kidney disease, or unspecified chronic kidney disease: Secondary | ICD-10-CM | POA: Diagnosis not present

## 2023-01-10 DIAGNOSIS — R569 Unspecified convulsions: Secondary | ICD-10-CM | POA: Diagnosis not present

## 2023-01-10 DIAGNOSIS — M19041 Primary osteoarthritis, right hand: Secondary | ICD-10-CM | POA: Diagnosis not present

## 2023-01-10 DIAGNOSIS — E1122 Type 2 diabetes mellitus with diabetic chronic kidney disease: Secondary | ICD-10-CM | POA: Diagnosis not present

## 2023-01-10 DIAGNOSIS — I2510 Atherosclerotic heart disease of native coronary artery without angina pectoris: Secondary | ICD-10-CM | POA: Diagnosis not present

## 2023-01-10 DIAGNOSIS — S82141D Displaced bicondylar fracture of right tibia, subsequent encounter for closed fracture with routine healing: Secondary | ICD-10-CM | POA: Diagnosis not present

## 2023-01-10 DIAGNOSIS — M19042 Primary osteoarthritis, left hand: Secondary | ICD-10-CM | POA: Diagnosis not present

## 2023-01-10 DIAGNOSIS — N189 Chronic kidney disease, unspecified: Secondary | ICD-10-CM | POA: Diagnosis not present

## 2023-01-12 DIAGNOSIS — I1310 Hypertensive heart and chronic kidney disease without heart failure, with stage 1 through stage 4 chronic kidney disease, or unspecified chronic kidney disease: Secondary | ICD-10-CM | POA: Diagnosis not present

## 2023-01-12 DIAGNOSIS — M19042 Primary osteoarthritis, left hand: Secondary | ICD-10-CM | POA: Diagnosis not present

## 2023-01-12 DIAGNOSIS — M19041 Primary osteoarthritis, right hand: Secondary | ICD-10-CM | POA: Diagnosis not present

## 2023-01-12 DIAGNOSIS — S82141D Displaced bicondylar fracture of right tibia, subsequent encounter for closed fracture with routine healing: Secondary | ICD-10-CM | POA: Diagnosis not present

## 2023-01-12 DIAGNOSIS — G4700 Insomnia, unspecified: Secondary | ICD-10-CM | POA: Diagnosis not present

## 2023-01-12 DIAGNOSIS — R569 Unspecified convulsions: Secondary | ICD-10-CM | POA: Diagnosis not present

## 2023-01-12 DIAGNOSIS — N189 Chronic kidney disease, unspecified: Secondary | ICD-10-CM | POA: Diagnosis not present

## 2023-01-12 DIAGNOSIS — E1122 Type 2 diabetes mellitus with diabetic chronic kidney disease: Secondary | ICD-10-CM | POA: Diagnosis not present

## 2023-01-12 DIAGNOSIS — I2510 Atherosclerotic heart disease of native coronary artery without angina pectoris: Secondary | ICD-10-CM | POA: Diagnosis not present

## 2023-01-17 DIAGNOSIS — S82141D Displaced bicondylar fracture of right tibia, subsequent encounter for closed fracture with routine healing: Secondary | ICD-10-CM | POA: Diagnosis not present

## 2023-01-17 DIAGNOSIS — N189 Chronic kidney disease, unspecified: Secondary | ICD-10-CM | POA: Diagnosis not present

## 2023-01-17 DIAGNOSIS — G4700 Insomnia, unspecified: Secondary | ICD-10-CM | POA: Diagnosis not present

## 2023-01-17 DIAGNOSIS — R569 Unspecified convulsions: Secondary | ICD-10-CM | POA: Diagnosis not present

## 2023-01-17 DIAGNOSIS — I2510 Atherosclerotic heart disease of native coronary artery without angina pectoris: Secondary | ICD-10-CM | POA: Diagnosis not present

## 2023-01-17 DIAGNOSIS — M19042 Primary osteoarthritis, left hand: Secondary | ICD-10-CM | POA: Diagnosis not present

## 2023-01-17 DIAGNOSIS — E1122 Type 2 diabetes mellitus with diabetic chronic kidney disease: Secondary | ICD-10-CM | POA: Diagnosis not present

## 2023-01-17 DIAGNOSIS — I1310 Hypertensive heart and chronic kidney disease without heart failure, with stage 1 through stage 4 chronic kidney disease, or unspecified chronic kidney disease: Secondary | ICD-10-CM | POA: Diagnosis not present

## 2023-01-17 DIAGNOSIS — M19041 Primary osteoarthritis, right hand: Secondary | ICD-10-CM | POA: Diagnosis not present

## 2023-01-18 DIAGNOSIS — M19041 Primary osteoarthritis, right hand: Secondary | ICD-10-CM | POA: Diagnosis not present

## 2023-01-18 DIAGNOSIS — N189 Chronic kidney disease, unspecified: Secondary | ICD-10-CM | POA: Diagnosis not present

## 2023-01-18 DIAGNOSIS — E1122 Type 2 diabetes mellitus with diabetic chronic kidney disease: Secondary | ICD-10-CM | POA: Diagnosis not present

## 2023-01-18 DIAGNOSIS — R569 Unspecified convulsions: Secondary | ICD-10-CM | POA: Diagnosis not present

## 2023-01-18 DIAGNOSIS — I2510 Atherosclerotic heart disease of native coronary artery without angina pectoris: Secondary | ICD-10-CM | POA: Diagnosis not present

## 2023-01-18 DIAGNOSIS — I1310 Hypertensive heart and chronic kidney disease without heart failure, with stage 1 through stage 4 chronic kidney disease, or unspecified chronic kidney disease: Secondary | ICD-10-CM | POA: Diagnosis not present

## 2023-01-18 DIAGNOSIS — M19042 Primary osteoarthritis, left hand: Secondary | ICD-10-CM | POA: Diagnosis not present

## 2023-01-18 DIAGNOSIS — S82141D Displaced bicondylar fracture of right tibia, subsequent encounter for closed fracture with routine healing: Secondary | ICD-10-CM | POA: Diagnosis not present

## 2023-01-18 DIAGNOSIS — G4700 Insomnia, unspecified: Secondary | ICD-10-CM | POA: Diagnosis not present

## 2023-01-23 DIAGNOSIS — S82141D Displaced bicondylar fracture of right tibia, subsequent encounter for closed fracture with routine healing: Secondary | ICD-10-CM | POA: Diagnosis not present

## 2023-01-24 ENCOUNTER — Encounter: Payer: Self-pay | Admitting: Internal Medicine

## 2023-01-27 ENCOUNTER — Other Ambulatory Visit: Payer: Self-pay | Admitting: Internal Medicine

## 2023-01-27 DIAGNOSIS — E1169 Type 2 diabetes mellitus with other specified complication: Secondary | ICD-10-CM

## 2023-02-01 DIAGNOSIS — S83281D Other tear of lateral meniscus, current injury, right knee, subsequent encounter: Secondary | ICD-10-CM | POA: Diagnosis not present

## 2023-02-01 DIAGNOSIS — S82141D Displaced bicondylar fracture of right tibia, subsequent encounter for closed fracture with routine healing: Secondary | ICD-10-CM | POA: Diagnosis not present
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
## 2023-03-14 ENCOUNTER — Ambulatory Visit: Payer: Medicare PPO | Attending: Cardiovascular Disease | Admitting: Cardiovascular Disease

## 2023-03-14 ENCOUNTER — Encounter: Payer: Self-pay | Admitting: Cardiovascular Disease

## 2023-03-14 VITALS — BP 142/76 | HR 83 | Ht 65.0 in | Wt 213.5 lb

## 2023-03-14 DIAGNOSIS — I10 Essential (primary) hypertension: Secondary | ICD-10-CM

## 2023-03-14 DIAGNOSIS — E785 Hyperlipidemia, unspecified: Secondary | ICD-10-CM

## 2023-03-14 DIAGNOSIS — Z7984 Long term (current) use of oral hypoglycemic drugs: Secondary | ICD-10-CM | POA: Diagnosis not present

## 2023-03-14 DIAGNOSIS — I700 Atherosclerosis of aorta: Secondary | ICD-10-CM

## 2023-03-14 DIAGNOSIS — I358 Other nonrheumatic aortic valve disorders: Secondary | ICD-10-CM

## 2023-03-14 DIAGNOSIS — E1169 Type 2 diabetes mellitus with other specified complication: Secondary | ICD-10-CM | POA: Diagnosis not present

## 2023-03-14 DIAGNOSIS — E1121 Type 2 diabetes mellitus with diabetic nephropathy: Secondary | ICD-10-CM

## 2023-03-14 NOTE — Patient Instructions (Addendum)
Medication Instructions:  No changes  If you need a refill on your cardiac medications before your next appointment, please call your pharmacy.   Lab work: No new labs needed  Testing/Procedures: Your physician has requested that you have an echocardiogram. Echocardiography is a painless test that uses sound waves to create images of your heart. It provides your doctor with information about the size and shape of your heart and how well your heart's chambers and valves are working.   You may receive an ultrasound enhancing agent through an IV if needed to better visualize your heart during the echo. This procedure takes approximately one hour.  There are no restrictions for this procedure.  This will take place at [address] ([facility] [address], [place] [address]   Follow-Up: At [facility], you and your health needs are our priority.  As part of our continuing mission to provide you with exceptional heart care, we have created designated Provider Care Teams.  These Care Teams include your primary Cardiologist (physician) and Advanced Practice Providers (APPs -  Physician Assistants and Nurse Practitioners) who all work together to provide you with the care you need, when you need it.  You will need a follow up appointment as needed  Providers on your designated Care Team:   Christopher Berge, NP Ryan Dunn, PA-C Cadence Furth, PA-C  COVID-19 Vaccine Information can be found at: [email or web address] For questions related to vaccine distribution or appointments, please email [email or web address] or call [phone].   

## 2023-03-15 DIAGNOSIS — S82141D Displaced bicondylar fracture of right tibia, subsequent encounter for closed fracture with routine healing: Secondary | ICD-10-CM | POA: Diagnosis not present

## 2023-03-15 DIAGNOSIS — S83281D Other tear of lateral meniscus, current injury, right knee, subsequent encounter: Secondary | ICD-10-CM | POA: Diagnosis not present

## 2023-03-16 ENCOUNTER — Ambulatory Visit: Payer: Medicare PPO | Admitting: Internal Medicine

## 2023-03-16 ENCOUNTER — Encounter: Payer: Self-pay | Admitting: Internal Medicine

## 2023-03-16 VITALS — BP 136/70 | HR 90 | Temp 97.9°F | Ht 65.0 in | Wt 217.0 lb

## 2023-03-16 DIAGNOSIS — G8929 Other chronic pain: Secondary | ICD-10-CM

## 2023-03-16 DIAGNOSIS — E1121 Type 2 diabetes mellitus with diabetic nephropathy: Secondary | ICD-10-CM

## 2023-03-16 DIAGNOSIS — F5101 Primary insomnia: Secondary | ICD-10-CM

## 2023-03-16 DIAGNOSIS — E785 Hyperlipidemia, unspecified: Secondary | ICD-10-CM

## 2023-03-16 DIAGNOSIS — R944 Abnormal results of kidney function studies: Secondary | ICD-10-CM | POA: Diagnosis not present

## 2023-03-16 DIAGNOSIS — D508 Other iron deficiency anemias: Secondary | ICD-10-CM

## 2023-03-16 DIAGNOSIS — D72829 Elevated white blood cell count, unspecified: Secondary | ICD-10-CM | POA: Diagnosis not present

## 2023-03-16 DIAGNOSIS — E1169 Type 2 diabetes mellitus with other specified complication: Secondary | ICD-10-CM

## 2023-03-16 DIAGNOSIS — I358 Other nonrheumatic aortic valve disorders: Secondary | ICD-10-CM | POA: Diagnosis not present

## 2023-03-16 DIAGNOSIS — M25561 Pain in right knee: Secondary | ICD-10-CM | POA: Diagnosis not present

## 2023-03-16 DIAGNOSIS — I10 Essential (primary) hypertension: Secondary | ICD-10-CM

## 2023-03-16 LAB — COMPREHENSIVE METABOLIC PANEL
ALT: 32 U/L (ref 0–35)
AST: 19 U/L (ref 0–37)
Albumin: 4.1 g/dL (ref 3.5–5.2)
Alkaline Phosphatase: 72 U/L (ref 39–117)
BUN: 30 mg/dL — ABNORMAL HIGH (ref 6–23)
CO2: 22 mEq/L (ref 19–32)
Calcium: 9.8 mg/dL (ref 8.4–10.5)
Chloride: 104 mEq/L (ref 96–112)
Creatinine, Ser: 0.97 mg/dL (ref 0.40–1.20)
GFR: 59.26 mL/min — ABNORMAL LOW (ref >60.00)
Glucose, Bld: 279 mg/dL — ABNORMAL HIGH (ref 70–99)
Potassium: 4.4 mEq/L (ref 3.5–5.1)
Sodium: 137 mEq/L (ref 135–145)
Total Bilirubin: 0.3 mg/dL (ref 0.2–1.2)
Total Protein: 6.9 g/dL (ref 6.0–8.3)

## 2023-03-16 LAB — HEMOGLOBIN A1C: Hgb A1c MFr Bld: 6.6 % — ABNORMAL HIGH (ref 4.6–6.5)

## 2023-03-16 LAB — MICROALBUMIN / CREATININE URINE RATIO
Creatinine,U: 72 mg/dL
Microalb Creat Ratio: 1.4 mg/g (ref 0.0–30.0)
Microalb, Ur: 1 mg/dL (ref 0.0–1.9)

## 2023-03-16 LAB — CBC WITH DIFFERENTIAL/PLATELET
Basophils Absolute: 0 10*3/uL (ref 0.0–0.1)
Basophils Relative: 0.2 % (ref 0.0–3.0)
Eosinophils Absolute: 0 10*3/uL (ref 0.0–0.7)
Eosinophils Relative: 0 % (ref 0.0–5.0)
HCT: 37.1 % (ref 36.0–46.0)
Hemoglobin: 11.6 g/dL — ABNORMAL LOW (ref 12.0–15.0)
Lymphocytes Relative: 13.7 % (ref 12.0–46.0)
Lymphs Abs: 1.9 10*3/uL (ref 0.7–4.0)
MCHC: 31.3 g/dL (ref 30.0–36.0)
MCV: 82.6 fl (ref 78.0–100.0)
Monocytes Absolute: 0.6 10*3/uL (ref 0.1–1.0)
Monocytes Relative: 4 % (ref 3.0–12.0)
Neutro Abs: 11.5 10*3/uL — ABNORMAL HIGH (ref 1.4–7.7)
Neutrophils Relative %: 82.1 % — ABNORMAL HIGH (ref 43.0–77.0)
Platelets: 364 10*3/uL (ref 150.0–400.0)
RBC: 4.49 Mil/uL (ref 3.87–5.11)
RDW: 17.3 % — ABNORMAL HIGH (ref 11.5–15.5)
WBC: 14 10*3/uL — ABNORMAL HIGH (ref 4.0–10.5)

## 2023-03-16 LAB — LIPID PANEL
Cholesterol: 128 mg/dL (ref 0–200)
HDL: 50.6 mg/dL (ref >39.00)
LDL Cholesterol: 57 mg/dL (ref 0–99)
NonHDL: 77.59
Total CHOL/HDL Ratio: 3
Triglycerides: 101 mg/dL (ref 0.0–149.0)
VLDL: 20.2 mg/dL (ref 0.0–40.0)

## 2023-03-16 LAB — LDL CHOLESTEROL, DIRECT: Direct LDL: 55 mg/dL

## 2023-03-16 MED ORDER — EMPAGLIFLOZIN 25 MG PO TABS: 25.0000 mg | ORAL_TABLET | Freq: Every day | ORAL | 1 refills | Status: DC

## 2023-03-16 MED ORDER — CARVEDILOL 3.125 MG PO TABS: ORAL_TABLET | ORAL | 1 refills | Status: DC

## 2023-03-16 MED ORDER — TRAMADOL HCL 50 MG PO TABS: 50.0000 mg | ORAL_TABLET | Freq: Four times a day (QID) | ORAL | 0 refills | Status: DC | PRN

## 2023-03-16 MED ORDER — AMLODIPINE BESYLATE 10 MG PO TABS: 10.0000 mg | ORAL_TABLET | Freq: Every day | ORAL | 1 refills | Status: DC

## 2023-03-16 MED ORDER — METFORMIN HCL 1000 MG PO TABS: ORAL_TABLET | ORAL | 1 refills | Status: DC

## 2023-03-16 MED ORDER — VALSARTAN 80 MG PO TABS: ORAL_TABLET | ORAL | 1 refills | Status: DC

## 2023-03-16 NOTE — Patient Instructions (Addendum)
iNCREAS YOUR VALSARTAN DOSE TO 80 MG DAILY    Reduce your daily tylenol dose to 2000 mg daily  in divided doses for chronic daily use (1000 mg every 12 hours)    I will add tramadol  to take in between the tylenol doses.  Start with 2 tramadol daily,  DO NOT GO ABOVE 4 DAILY  OR YOU WILL RUN OUT EARLY    I WILL REFILL FOR 30 DAYS ONCE I HEAR FROM YOU ABOUT HOW WELL IT IS CONTROLLING YOUR PAIN AND HOW MANY PER DAY YOU ARE NEEDING (CONTINUE TYLENOL 2000 MG DAILY )

## 2023-03-16 NOTE — Assessment & Plan Note (Signed)
Secondary to trauma.  DJD and Baker's cyst.  Hseeing orthopedics,  injection yesterday was painful.  She has been using excessive amounts of tylenol  for months to manage pain.  She had elevated CR with NSAID use which resolved with avoidance.   Advised to reduce daily dose to 2000 mg daily and add tramaodl  every 12 hours.  Rx for #28 given as initial rx.

## 2023-03-16 NOTE — Assessment & Plan Note (Signed)
Currently well-controlled on current medications . t.  She will continue jARDIANCE, Metfomrin and glizpide,  [facility] pharmacy consult has been  beneficial in providing  patient  with assistance programs for SGLT 2 inhibitors. Patient is tolerating statin therapy for CAD risk reduction and on ACE/ARB for renal protection and hypertension   Lab Results  Component Value Date   HGBA1C 5.7 12/13/2022   Lab Results  Component Value Date   MICROALBUR 6.1 (H) 11/20/2021   MICROALBUR 6.2 (H) 07/17/2020

## 2023-03-16 NOTE — Assessment & Plan Note (Signed)
Echo ORDERED BY TIM GOLLAN
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
## 2023-03-16 NOTE — Assessment & Plan Note (Signed)
Not at goal on maximal doses of amlodipine and carvedilol and having fluid retention.  Added spironolactone 25 mg daily  at last visit,  increase valsartan to 80 mg daily

## 2023-03-16 NOTE — Assessment & Plan Note (Addendum)
Resolved with iv iron infusion.rechecking hgb todAY  Lab Results  Component Value Date   WBC 8.9 12/13/2022   HGB 12.3 12/13/2022   HCT 39.3 12/13/2022   MCV 84.1 12/13/2022   PLT 327.0 12/13/2022

## 2023-03-16 NOTE — Assessment & Plan Note (Signed)
Improved  after suspenion OF  NSAIDs  Lab Results  Component Value Date   CREATININE 0.93 12/13/2022   Lab Results  Component Value Date   NA 142 12/13/2022   K 4.5 12/13/2022   CL 111 12/13/2022   CO2 20 12/13/2022

## 2023-03-17 DIAGNOSIS — D72829 Elevated white blood cell count, unspecified: Secondary | ICD-10-CM | POA: Insufficient documentation

## 2023-03-17 NOTE — Assessment & Plan Note (Signed)
Chronic, with no improvement using over-the-counter first generation antihistamines.  Continue use of ambien per patient request.  The risks and benefits of hypnotics were discussed with patient today including excessive sedation leading to respiratory depression,  impaired thinking/driving, and addiction.  Patient was advised to avoid concurrent use with alcohol, to use medication only as needed and not to share with others  .

## 2023-03-17 NOTE — Assessment & Plan Note (Signed)
Likely due to steroid injection received one day prior.  Will repeat in 3 months   Lab Results  Component Value Date   WBC 14.0 (H) 03/16/2023   HGB 11.6 (L) 03/16/2023   HCT 37.1 03/16/2023   MCV 82.6 03/16/2023   PLT 364.0 03/16/2023

## 2023-03-18 LAB — HM DIABETES EYE EXAM

## 2023-03-19 ENCOUNTER — Encounter: Payer: Self-pay | Admitting: Internal Medicine

## 2023-03-21 ENCOUNTER — Ambulatory Visit: Payer: Medicare PPO | Admitting: Internal Medicine

## 2023-03-21 MED ORDER — TRAMADOL HCL 50 MG PO TABS: 50.0000 mg | ORAL_TABLET | Freq: Four times a day (QID) | ORAL | 2 refills | Status: DC | PRN

## 2023-03-22 DIAGNOSIS — N182 Chronic kidney disease, stage 2 (mild): Secondary | ICD-10-CM | POA: Diagnosis not present

## 2023-03-22 DIAGNOSIS — E1122 Type 2 diabetes mellitus with diabetic chronic kidney disease: Secondary | ICD-10-CM | POA: Diagnosis not present

## 2023-03-22 DIAGNOSIS — D631 Anemia in chronic kidney disease: Secondary | ICD-10-CM | POA: Diagnosis not present

## 2023-03-22 DIAGNOSIS — I10 Essential (primary) hypertension: Secondary | ICD-10-CM | POA: Diagnosis not present

## 2023-04-03 ENCOUNTER — Other Ambulatory Visit: Payer: Self-pay | Admitting: Internal Medicine

## 2023-04-04 NOTE — Telephone Encounter (Signed)
Looks like prescription was increased to 80 mg on 03/16/2023. Is it okay to refuse the 40 mg refill?

## 2023-04-06 ENCOUNTER — Other Ambulatory Visit: Payer: Medicare PPO

## 2023-04-13 ENCOUNTER — Other Ambulatory Visit: Payer: Medicare PPO

## 2023-04-18 DIAGNOSIS — E119 Type 2 diabetes mellitus without complications: Secondary | ICD-10-CM | POA: Diagnosis not present

## 2023-04-18 DIAGNOSIS — H25813 Combined forms of age-related cataract, bilateral: Secondary | ICD-10-CM | POA: Diagnosis not present

## 2023-04-18 DIAGNOSIS — H5203 Hypermetropia, bilateral: Secondary | ICD-10-CM | POA: Diagnosis not present

## 2023-04-18 DIAGNOSIS — H40033 Anatomical narrow angle, bilateral: Secondary | ICD-10-CM | POA: Diagnosis not present

## 2023-04-30 ENCOUNTER — Encounter: Payer: Self-pay | Admitting: Internal Medicine

## 2023-05-02 NOTE — Telephone Encounter (Signed)
FYI

## 2023-05-04 ENCOUNTER — Ambulatory Visit: Payer: Medicare PPO

## 2023-05-04 DIAGNOSIS — I358 Other nonrheumatic aortic valve disorders: Secondary | ICD-10-CM

## 2023-05-04 LAB — ECHOCARDIOGRAM COMPLETE
AR max vel: 1.77 cm2
AV Area VTI: 1.75 cm2
AV Area mean vel: 1.72 cm2
AV Mean grad: 12 mmHg
AV Peak grad: 22.5 mmHg
Ao pk vel: 2.37 m/s
Area-P 1/2: 3.12 cm2
S' Lateral: 2.8 cm

## 2023-05-05 ENCOUNTER — Other Ambulatory Visit: Payer: Self-pay | Admitting: Internal Medicine

## 2023-05-09 ENCOUNTER — Encounter: Payer: Self-pay | Admitting: Cardiovascular Disease

## 2023-05-27 ENCOUNTER — Other Ambulatory Visit: Payer: Self-pay | Admitting: Internal Medicine

## 2023-06-16 ENCOUNTER — Ambulatory Visit: Payer: Medicare PPO | Admitting: Internal Medicine

## 2023-06-16 ENCOUNTER — Encounter: Payer: Self-pay | Admitting: Internal Medicine

## 2023-06-16 VITALS — BP 126/66 | HR 85 | Temp 98.3°F | Ht 65.0 in | Wt 219.4 lb

## 2023-06-16 DIAGNOSIS — Z7984 Long term (current) use of oral hypoglycemic drugs: Secondary | ICD-10-CM

## 2023-06-16 DIAGNOSIS — E1121 Type 2 diabetes mellitus with diabetic nephropathy: Secondary | ICD-10-CM | POA: Diagnosis not present

## 2023-06-16 DIAGNOSIS — Z6838 Body mass index (BMI) 38.0-38.9, adult: Secondary | ICD-10-CM | POA: Diagnosis not present

## 2023-06-16 DIAGNOSIS — E66812 Obesity, class 2: Secondary | ICD-10-CM | POA: Diagnosis not present

## 2023-06-16 DIAGNOSIS — I358 Other nonrheumatic aortic valve disorders: Secondary | ICD-10-CM | POA: Diagnosis not present

## 2023-06-16 DIAGNOSIS — S82141A Displaced bicondylar fracture of right tibia, initial encounter for closed fracture: Secondary | ICD-10-CM

## 2023-06-16 DIAGNOSIS — Z23 Encounter for immunization: Secondary | ICD-10-CM | POA: Diagnosis not present

## 2023-06-16 DIAGNOSIS — R600 Localized edema: Secondary | ICD-10-CM

## 2023-06-16 LAB — LIPID PANEL
Cholesterol: 101 mg/dL (ref 0–200)
HDL: 44.3 mg/dL (ref >39.00)
LDL Cholesterol: 36 mg/dL (ref 0–99)
NonHDL: 56.52
Total CHOL/HDL Ratio: 2
Triglycerides: 104 mg/dL (ref 0.0–149.0)
VLDL: 20.8 mg/dL (ref 0.0–40.0)

## 2023-06-16 LAB — COMPREHENSIVE METABOLIC PANEL
ALT: 18 U/L (ref 0–35)
AST: 16 U/L (ref 0–37)
Albumin: 4 g/dL (ref 3.5–5.2)
Alkaline Phosphatase: 81 U/L (ref 39–117)
BUN: 17 mg/dL (ref 6–23)
CO2: 28 meq/L (ref 19–32)
Calcium: 9.6 mg/dL (ref 8.4–10.5)
Chloride: 103 meq/L (ref 96–112)
Creatinine, Ser: 0.85 mg/dL (ref 0.40–1.20)
GFR: 69.31 mL/min (ref >60.00)
Glucose, Bld: 137 mg/dL — ABNORMAL HIGH (ref 70–99)
Potassium: 3.9 meq/L (ref 3.5–5.1)
Sodium: 139 meq/L (ref 135–145)
Total Bilirubin: 0.6 mg/dL (ref 0.2–1.2)
Total Protein: 6.3 g/dL (ref 6.0–8.3)

## 2023-06-16 LAB — HEMOGLOBIN A1C: Hgb A1c MFr Bld: 6.6 % — ABNORMAL HIGH (ref 4.6–6.5)

## 2023-06-16 LAB — LDL CHOLESTEROL, DIRECT: Direct LDL: 42 mg/dL

## 2023-06-16 MED ORDER — TIRZEPATIDE 2.5 MG/0.5ML ~~LOC~~ SOAJ: 2.5000 mg | SUBCUTANEOUS | 2 refills | Status: DC

## 2023-06-16 MED ORDER — TRAMADOL HCL 50 MG PO TABS: 50.0000 mg | ORAL_TABLET | Freq: Three times a day (TID) | ORAL | 5 refills | Status: DC | PRN

## 2023-06-16 NOTE — Progress Notes (Signed)
Subjective:  Patient ID: Cheryl Wallace, female    DOB: 01-25-1951  Age: 72 y.o. MRN: [id number]  CC: The primary encounter diagnosis was Aortic systolic murmur on examination. Diagnoses of Closed bicondylar fracture of right tibial plateau, Class 2 severe obesity due to excess calories with serious comorbidity and body mass index (BMI) of 38.0 to 38.9 in adult [facility]), Type 2 diabetes mellitus with nephropathy (HCC), Need for influenza vaccination, and Leg edema, right were also pertinent to this visit.   HPI Cheryl Wallace presents for  Chief Complaint  Patient presents with   Medical Management of Chronic Issues    3 month follow up      1) OBESITY /. DIABETES/  HYPERTENSION :  T2DM:  She  feels generally well,  But is not  exercising regularly  due to chronic knee pain .  Not checking  blood sugars.  Denies any recent hypoglyemic symptoms.  .  Taking   Jardiance. Glipizide and metformin  as directed. Following a carbohydrate modified diet 6 days per week. Denies numbness, burning and tingling of extremities. Appetite is good.   Lab Results  Component Value Date   HGBA1C 6.6 (H) 06/16/2023     2) mild aortic valve stenosis  EF 55 to 60% by recent  echo .  No symptoms of syncope, presyncope.   3) RIGHT KNEE PAIN , PERSISTENT  .  HAS BEEN RELEASED BY ORTHOPEDIC SURGERY (SURGERY WAS  Nov 04 2022 )  TO REPAIR FRACTURE OF TIBIAL PLATEAU  . USING TRAMADOL 3 TIMES DAILY   Outpatient Medications Prior to Visit  Medication Sig Dispense Refill   amLODipine (NORVASC) 10 MG tablet Take 1 tablet (10 mg total) by mouth daily. 90 tablet 1   atorvastatin (LIPITOR) 20 MG tablet TAKE 1 TABLET BY MOUTH EVERY DAY 90 tablet 3   carvedilol (COREG) 3.125 MG tablet TAKE 1 TABLET BY MOUTH TWICE A DAY WITH A MEAL 180 tablet 1   cholecalciferol (VITAMIN D) 1000 UNITS tablet Take 1,000 Units by mouth daily.      diphenhydrAMINE (BENADRYL) 50 MG tablet Take 50 mg by mouth at bedtime as needed for allergies.      empagliflozin (JARDIANCE) 25 MG TABS tablet Take 1 tablet (25 mg total) by mouth daily. 90 tablet 1   EPINEPHrine 0.3 mg/0.3 mL IJ SOAJ injection Inject 0.3 mg into the muscle as needed for anaphylaxis. Due to shellfish allergy 1 each 1   fexofenadine (ALLEGRA) 180 MG tablet Take 180 mg by mouth every morning.     fluticasone (FLONASE) 50 MCG/ACT nasal spray Place 1 spray into both nostrils daily as needed for allergies.     glipiZIDE (GLUCOTROL) 5 MG tablet Take 5 mg by mouth 2 (two) times daily.     metFORMIN (GLUCOPHAGE) 1000 MG tablet TAKE 1 TABLET (1,000 MG TOTAL) BY MOUTH TWICE A DAY WITH FOOD 180 tablet 1   omeprazole (PRILOSEC) 40 MG capsule TAKE 1 CAPSULE BY MOUTH EVERY DAY 90 capsule 3   spironolactone (ALDACTONE) 25 MG tablet TAKE 1 TABLET (25 MG TOTAL) BY MOUTH DAILY. 90 tablet 3   valsartan (DIOVAN) 80 MG tablet Take 1 tablet (80 mg total) by mouth daily. 90 tablet 3   zolpidem (AMBIEN) 5 MG tablet TAKE 1 TABLET BY MOUTH EVERY DAY AT BEDTIME AS NEEDED FOR SLEEP 30 tablet 5   traMADol (ULTRAM) 50 MG tablet Take 1 tablet (50 mg total) by mouth every 6 (six) hours as needed. 120  tablet 2   methocarbamol (ROBAXIN) 500 MG tablet Take 1 tablet (500 mg total) by mouth every 6 (six) hours as needed for muscle spasms. 28 tablet 0   No facility-administered medications prior to visit.    Review of Systems;  Patient denies headache, fevers, malaise, unintentional weight loss, skin rash, eye pain, sinus congestion and sinus pain, sore throat, dysphagia,  hemoptysis , cough, dyspnea, wheezing, chest pain, palpitations, orthopnea, edema, abdominal pain, nausea, melena, diarrhea, constipation, flank pain, dysuria, hematuria, urinary  Frequency, nocturia, numbness, tingling, seizures,  Focal weakness, Loss of consciousness,  Tremor, insomnia, depression, anxiety, and suicidal ideation.      Objective:  BP 126/66   Pulse 85   Temp 98.3 F (36.8 C) (Oral)   Ht 5\' 5"  (1.651 m)   Wt 219 lb  6.4 oz (99.5 kg)   SpO2 93%   BMI 36.51 kg/m   BP Readings from Last 3 Encounters:  06/16/23 126/66  03/16/23 136/70  03/14/23 (!) 142/76    Wt Readings from Last 3 Encounters:  06/16/23 219 lb 6.4 oz (99.5 kg)  03/16/23 217 lb (98.4 kg)  03/14/23 213 lb 8 oz (96.8 kg)    Physical Exam Vitals reviewed.  Constitutional:      General: She is not in acute distress.    Appearance: Normal appearance. She is normal weight. She is not ill-appearing, toxic-appearing or diaphoretic.  HENT:     Head: Normocephalic.  Eyes:     General: No scleral icterus.       Right eye: No discharge.        Left eye: No discharge.     Conjunctiva/sclera: Conjunctivae normal.  Cardiovascular:     Rate and Rhythm: Normal rate and regular rhythm.     Heart sounds: Normal heart sounds.  Pulmonary:     Effort: Pulmonary effort is normal. No respiratory distress.     Breath sounds: Normal breath sounds.  Musculoskeletal:        General: Normal range of motion.     Right lower leg: Edema present.  Skin:    General: Skin is warm and dry.  Neurological:     General: No focal deficit present.     Mental Status: She is alert and oriented to person, place, and time. Mental status is at baseline.     Cranial Nerves: Cranial nerve deficit present.  Psychiatric:        Mood and Affect: Mood normal.        Behavior: Behavior normal.        Thought Content: Thought content normal.        Judgment: Judgment normal.    Lab Results  Component Value Date   HGBA1C 6.6 (H) 06/16/2023   HGBA1C 6.6 (H) 03/16/2023   HGBA1C 5.7 12/13/2022    Lab Results  Component Value Date   CREATININE 0.85 06/16/2023   CREATININE 0.97 03/16/2023   CREATININE 0.93 12/13/2022    Lab Results  Component Value Date   WBC 14.0 (H) 03/16/2023   HGB 11.6 (L) 03/16/2023   HCT 37.1 03/16/2023   PLT 364.0 03/16/2023   GLUCOSE 137 (H) 06/16/2023   CHOL 101 06/16/2023   TRIG 104.0 06/16/2023   HDL 44.30 06/16/2023    LDLDIRECT 42.0 06/16/2023   LDLCALC 36 06/16/2023   ALT 18 06/16/2023   AST 16 06/16/2023   NA 139 06/16/2023   K 3.9 06/16/2023   CL 103 06/16/2023   CREATININE 0.85 06/16/2023   BUN  17 06/16/2023   CO2 28 06/16/2023   TSH 1.272 06/24/2021   HGBA1C 6.6 (H) 06/16/2023   MICROALBUR 1.0 03/16/2023    DG Knee Right Port  Result Date: 11/03/2022 CLINICAL DATA:  Status post RIGHT knee repair EXAM: PORTABLE RIGHT KNEE - 1-2 VIEW COMPARISON:  None Available. FINDINGS: Dynamic plate fixation along the lateral border of the tibia with 6 cortical screws. Normal alignment of the tibial plateau fracture. IMPRESSION: ORIF tibial plateau fracture. Electronically Signed   By: Genevive Bi M.D.   On: 11/03/2022 09:58   DG Knee Complete 4 Views Right  Result Date: 11/03/2022 CLINICAL DATA:  Elective surgery EXAM: RIGHT KNEE - COMPLETE 4+ VIEW COMPARISON:  Right knee CT 10/26/2022 FINDINGS: Intraoperative images during lateral tibial plateau fixation. Intact plate fixation hardware. Improved fracture alignment. IMPRESSION: Intraoperative images during lateral tibial plateau plate fixation. Improved fracture alignment. No evidence of immediate complication. Electronically Signed   By: Caprice Renshaw M.D.   On: 11/03/2022 08:54   DG C-Arm 1-60 Min-No Report  Result Date: 11/03/2022 Fluoroscopy was utilized by the requesting physician.  No radiographic interpretation.    Assessment & Plan:  .Aortic systolic murmur on examination Assessment & Plan: Reviewed recent ECHO:  mild AS    Closed bicondylar fracture of right tibial plateau Assessment & Plan: S/P RECONSTRUCTIVE SURGERY IN LATE FEB.  SHE HAS BEEN RELEASED BY ORTHOPEDIC SURGERY BUT  HAS CHRONIC PAIN AND CHRONIC BRUISING OF TIBIA.    WEARS A SELF PRESCRIBED ELASTIC BRACE DURING THE DAY BUT NOTES SOME RIGHT LEG SWELLING BY THE END OF THE DAY    Class 2 severe obesity due to excess calories with serious comorbidity and body mass index (BMI) of  38.0 to 38.9 in adult [facility])  Type 2 diabetes mellitus with nephropathy (HCC) Assessment & Plan: Currently well-controlled on current medications . t.  She will continue jARDIANCE, Metfomrin and glizpide,  [facility] pharmacy consult has been  beneficial in providing  patient  with assistance programs for SGLT 2 inhibitors. 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  Use of compression stockings advised    Other orders -     Tirzepatide; Inject 2.5 mg into the skin once a week.  Dispense: 2 mL; Refill: 2 -     traMADol HCl; Take 1 tablet (50 mg total) by mouth 3 (three) times daily as needed.  Dispense: 90 tablet; Refill: 5     I provided 30 minutes of face-to-face time during this encounter reviewing patient's last visit with me, patient's  most recent visit with cardiology,  nephrology,  and neurology,  recent surgical and non surgical procedures, previous  labs and imaging studies, counseling on currently addressed issues,  and post visit ordering to diagnostics and therapeutics .  Follow-up: Return in about 6 months (around 12/15/2023) for follow up diabetes, chronic pain management.   Sherlene Shams, MD

## 2023-06-16 NOTE — Assessment & Plan Note (Addendum)
Reviewed recent ECHO:  mild AS

## 2023-06-16 NOTE — Assessment & Plan Note (Signed)
S/P RECONSTRUCTIVE SURGERY IN LATE FEB.  SHE HAS BEEN RELEASED BY ORTHOPEDIC SURGERY BUT  HAS CHRONIC PAIN AND CHRONIC BRUISING OF TIBIA.    WEARS A SELF PRESCRIBED ELASTIC BRACE DURING THE DAY BUT NOTES SOME RIGHT LEG SWELLING BY THE END OF THE DAY

## 2023-06-16 NOTE — Patient Instructions (Addendum)
I RECOMMEND WEARING A LIGHT COMPRESSION SOCK ON YOUR RIGHT LEG DURING THE DAY WHEN IT SWELLS   IF YOU ARE WILLING TO TRY MOUNJARO,  I HAVE SENT IT TO YOUR PHARMACY MANY PEOPLE TOLERATE IT BETTER THAN OZEMPIC EVEN THOUGH IT WORKS THE SAME WAY   I AM REFILL THE TRAMADOL FOR USE 3 TIMES DAILY PLEASE REMEMBER TO USE TYLENOL AS WELL YOU CAN TRY ADDING VOLTAREN GEL IT MAY PROVIDE RELIEF

## 2023-06-18 DIAGNOSIS — R600 Localized edema: Secondary | ICD-10-CM | POA: Insufficient documentation

## 2023-06-18 NOTE — Assessment & Plan Note (Signed)
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  Use of compression stockings advised

## 2023-06-18 NOTE — Assessment & Plan Note (Signed)
Currently well-controlled on current medications . t.  She will continue jARDIANCE, Metfomrin and glizpide,  [facility] pharmacy consult has been  beneficial in providing  patient  with assistance programs for SGLT 2 inhibitors. Patient did not tolerate Ozempic trial but is willing to try Mounjaro,t  she is olerating statin therapy for CAD risk reduction and on ACE/ARB for renal protection and hypertension   Lab Results  Component Value Date   HGBA1C 6.6 (H) 06/16/2023   Lab Results  Component Value Date   MICROALBUR 1.0 03/16/2023   MICROALBUR 6.1 (H) 11/20/2021

## 2023-07-15 ENCOUNTER — Inpatient Hospital Stay: Payer: Medicare PPO | Attending: Oncology

## 2023-07-15 DIAGNOSIS — D509 Iron deficiency anemia, unspecified: Secondary | ICD-10-CM | POA: Diagnosis not present

## 2023-07-15 LAB — FERRITIN: Ferritin: 24 ng/mL (ref 11–307)

## 2023-07-15 LAB — IRON AND TIBC
Iron: 40 ug/dL (ref 28–170)
Saturation Ratios: 10 % — ABNORMAL LOW (ref 10.4–31.8)
TIBC: 412 ug/dL (ref 250–450)
UIBC: 372 ug/dL

## 2023-07-15 LAB — CBC
HCT: 37.3 % (ref 36.0–46.0)
Hemoglobin: 11.3 g/dL — ABNORMAL LOW (ref 12.0–15.0)
MCH: 25.8 pg — ABNORMAL LOW (ref 26.0–34.0)
MCHC: 30.3 g/dL (ref 30.0–36.0)
MCV: 85.2 fL (ref 80.0–100.0)
Platelets: 284 10*3/uL (ref 150–400)
RBC: 4.38 MIL/uL (ref 3.87–5.11)
RDW: 14.9 % (ref 11.5–15.5)
WBC: 7.4 10*3/uL (ref 4.0–10.5)
nRBC: 0 % (ref 0.0–0.2)

## 2023-07-19 DIAGNOSIS — H25013 Cortical age-related cataract, bilateral: Secondary | ICD-10-CM | POA: Diagnosis not present

## 2023-07-19 DIAGNOSIS — H25043 Posterior subcapsular polar age-related cataract, bilateral: Secondary | ICD-10-CM | POA: Diagnosis not present

## 2023-07-19 DIAGNOSIS — H2511 Age-related nuclear cataract, right eye: Secondary | ICD-10-CM | POA: Diagnosis not present

## 2023-07-19 DIAGNOSIS — H18413 Arcus senilis, bilateral: Secondary | ICD-10-CM | POA: Diagnosis not present

## 2023-07-19 DIAGNOSIS — H2513 Age-related nuclear cataract, bilateral: Secondary | ICD-10-CM | POA: Diagnosis not present

## 2023-08-01 ENCOUNTER — Other Ambulatory Visit: Payer: Self-pay | Admitting: Pharmacist

## 2023-08-01 DIAGNOSIS — E1121 Type 2 diabetes mellitus with diabetic nephropathy: Secondary | ICD-10-CM

## 2023-08-01 NOTE — Progress Notes (Unsigned)
Patient dropped off [employer] renewal paperwork for Jardiance 25 mg daily. Missing patient signauture.   Called patient. She will present to clinic 11/26 to sign form.   Provider form completed and placed in PCP inbox for signature.   [facility] refill sent to [facility], [facility] Jardiance 25 mg, #90. 3 RF  Rx [facility] team cc' for future re-enrollment/communications.   11/26: Patient has signed her application in clinic. Submitted today w fax confirmation receipt.   Loree Fee, PharmD Clinical Pharmacist [facility] [phone]

## 2023-08-02 MED ORDER — EMPAGLIFLOZIN 25 MG PO TABS: 25.0000 mg | ORAL_TABLET | Freq: Every day | ORAL | 3 refills | Status: DC

## 2023-08-08 ENCOUNTER — Encounter: Payer: Self-pay | Admitting: Oncology

## 2023-08-08 ENCOUNTER — Ambulatory Visit
Admission: EM | Admit: 2023-08-08 | Discharge: 2023-08-08 | Disposition: A | Discharge status: Home / Self Care | Payer: Medicare PPO

## 2023-08-08 ENCOUNTER — Encounter: Payer: Self-pay | Admitting: Emergency Medicine

## 2023-08-08 ENCOUNTER — Ambulatory Visit: Payer: Medicare PPO

## 2023-08-08 DIAGNOSIS — R059 Cough, unspecified: Secondary | ICD-10-CM | POA: Diagnosis not present

## 2023-08-08 DIAGNOSIS — J209 Acute bronchitis, unspecified: Secondary | ICD-10-CM

## 2023-08-08 DIAGNOSIS — N182 Chronic kidney disease, stage 2 (mild): Secondary | ICD-10-CM | POA: Diagnosis not present

## 2023-08-08 DIAGNOSIS — R062 Wheezing: Secondary | ICD-10-CM

## 2023-08-08 DIAGNOSIS — Z1152 Encounter for screening for COVID-19: Secondary | ICD-10-CM | POA: Diagnosis not present

## 2023-08-08 DIAGNOSIS — R051 Acute cough: Secondary | ICD-10-CM | POA: Diagnosis not present

## 2023-08-08 DIAGNOSIS — E1122 Type 2 diabetes mellitus with diabetic chronic kidney disease: Secondary | ICD-10-CM | POA: Diagnosis not present

## 2023-08-08 DIAGNOSIS — I10 Essential (primary) hypertension: Secondary | ICD-10-CM | POA: Diagnosis not present

## 2023-08-08 LAB — SARS CORONAVIRUS 2 BY RT PCR: SARS Coronavirus 2 by RT PCR: NEGATIVE

## 2023-08-08 MED ORDER — ALBUTEROL SULFATE HFA 108 (90 BASE) MCG/ACT IN AERS: 1.0000 | INHALATION_SPRAY | Freq: Four times a day (QID) | RESPIRATORY_TRACT | 0 refills | Status: DC | PRN

## 2023-08-08 MED ORDER — PROMETHAZINE-DM 6.25-15 MG/5ML PO SYRP: 5.0000 mL | ORAL_SOLUTION | Freq: Four times a day (QID) | ORAL | 0 refills | Status: DC | PRN

## 2023-08-08 NOTE — Discharge Instructions (Addendum)
-  COVID test is negative. - Will call if your x-ray shows pneumonia tonight or tomorrow morning.  If it does I will send antibiotics to the pharmacy. -I sent you an inhaler and cough medicine.  Increase rest and fluids. - Return if you have a return of the fever or worsening symptoms.  Go to the ER for any acute worsening of your symptoms. -Symptoms related to bronchitis can last for a few weeks.

## 2023-08-08 NOTE — ED Provider Notes (Signed)
[facility]    CSN: [id number] Arrival date & time: 08/08/23  1603      History   Chief Complaint Chief Complaint  Patient presents with   Cough   Nasal Congestion    HPI Cheryl Wallace is a 72 y.o. female presenting for 4-day history of productive cough, wheezing, shortness of breath.  Reports temps up to 100 degrees the first 3 days.  Denies sinus pain.  Has been a little congested and had postnasal drainage, scratchy throat.  Denies any sick contacts.  States she lives alone.  Denies any history of COPD or asthma.  Medical history significant for allergies, arthritis, CKD, hypertension, GERD, hyperlipidemia, obesity, type 2 diabetes and seizures.  HPI  Past Medical History:  Diagnosis Date   Acute meniscal tear, medial, right, sequela 09/22/2020   Allergic rhinitis    Anemia    iron deficiency   Arthritis    osteoarthritis right knee   Chronic kidney disease    nephropathy d/t diabetes   Complication of anesthesia 1981   While pt was under anesthesia, pt had an asthema attack during BTL due to having the flu   Depression, major, single episode, mild (HCC) 09/04/2016   Elbow tendonitis    a. bilat, s/p surgery.   Essential hypertension    Functional ovarian cysts 2011   a. s/p TAH (1994);  b. s/p oopherectomy (~2011)   Gallstones    GERD (gastroesophageal reflux disease)    History of kidney stones    Hyperlipidemia    Insomnia    Low grade fever    Obesity    Pneumonia    Polyarthralgia    Seasonal allergies    Seizure (HCC)    last episode at 72 years old. nothing since. unknown etiology   Tinnitus    Type 2 diabetes mellitus (HCC)    a.  Hemoglobin A1c in July 2016, 6.7.    Patient Active Problem List   Diagnosis Date Noted   Leg edema, right 06/18/2023   Leukocytosis (leucocytosis) 03/17/2023   Aortic systolic murmur on examination 12/13/2022   Acute blood loss as cause of postoperative anemia 12/13/2022   Hospital discharge follow-up  12/13/2022   Closed bicondylar fracture of right tibial plateau 11/03/2022   Shellfish allergy 06/24/2022   S/P cholecystectomy 06/24/2022   Preop examination    Decreased glomerular filtration rate (GFR) 03/26/2021   Right knee injury, sequela 07/17/2020   Acromioclavicular joint arthritis 02/17/2020   Chronic right shoulder pain 02/14/2020   Post-nasal drip 04/12/2019   Educated about COVID-19 virus infection 01/09/2019   Chronic pain of right knee 07/12/2018   Iron deficiency anemia 04/07/2018   Hyperlipidemia associated with type 2 diabetes mellitus (HCC) 09/04/2016   Depression, major, single episode, mild (HCC) 09/04/2016   Osteoarthritis of both hands 05/14/2016   S/P hysterectomy with oophorectomy 10/14/2015   Insomnia 10/14/2015   Essential hypertension    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   Subacromial impingement of left shoulder 04/03/2014   Benign lipomatous neoplasm of skin and subcutaneous tissue of left arm 04/03/2014   History of renal calculi 03/21/2014   Type 2 diabetes mellitus with nephropathy (HCC) 03/21/2014   Obesity 12/31/2012   Polyarthralgia 12/30/2012   Family history of colon cancer 12/29/2012   Allergic rhinitis due to fungal spores 12/21/2011   History of colonic polyps 12/27/2002    Past Surgical History:  Procedure Laterality Date   ABDOMINAL HYSTERECTOMY  1994   CLOSED MANIPULATION SHOULDER WITH  STERIOD INJECTION Right 01/06/2022   Procedure: CLOSED MANIPULATION SHOULDER WITH STEROID INJECTION;  Surgeon: Christena Flake, MD;  Location: [facility];  Service: Orthopedics;  Laterality: Right;   COLONOSCOPY  05/2007   Non-bleeding internal hemorhoids identified.   COLONOSCOPY WITH PROPOFOL N/A 02/25/2020   Procedure: COLONOSCOPY WITH PROPOFOL;  Surgeon: Toledo, Boykin Nearing, MD;  Location: [facility];  Service: Gastroenterology;  Laterality: N/A;   ELBOW SURGERY Bilateral    d/t tendonitis   ESOPHAGOGASTRODUODENOSCOPY (EGD) WITH PROPOFOL N/A  10/28/2021   Procedure: ESOPHAGOGASTRODUODENOSCOPY (EGD) WITH PROPOFOL;  Surgeon: Toledo, Boykin Nearing, MD;  Location: [facility];  Service: Gastroenterology;  Laterality: N/A;  DM   FRACTURE SURGERY Right    wrist/hand with plate   KNEE ARTHROSCOPY Right 11/18/2020   Procedure: Right knee arthroscopy, partial medial meniscectomy;  Surgeon: Kennedy Bucker, MD;  Location: [facility];  Service: Orthopedics;  Laterality: Right;   NASAL SINUS SURGERY  09/18/2015   Procedure: IMAGE GUIDED SINUS SURGERY, BILATERAL MAXILLARY BALLOON SINUPLASTY, BILATERAL FRONTAL BALLOON SINUPLASTY, RIGHT SPHENOID  SINUPLASTY, RIGHT CONCHABULLOSA RESECTION;  Surgeon: Bud Face, MD;  Location: [facility];  Service: ENT;;   OOPHORECTOMY  04/2010   bilateral, 7 cm benign tumor left ovary   OPEN REDUCTION INTERNAL FIXATION (ORIF) DISTAL RADIAL FRACTURE Right 02/07/2017   Procedure: OPEN REDUCTION INTERNAL FIXATION (ORIF) DISTAL RADIAL FRACTURE;  Surgeon: Kennedy Bucker, MD;  Location: [facility];  Service: Orthopedics;  Laterality: Right;   ORIF TIBIA PLATEAU Right 11/03/2022   Procedure: OPEN REDUCTION INTERNAL FIXATION (ORIF) TIBIAL PLATEAU;  Surgeon: Roby Lofts, MD;  Location: [facility];  Service: Orthopedics;  Laterality: Right;   PARTIAL HYSTERECTOMY  1994   still has ovaries, removed for fibroids   SHOULDER ARTHROSCOPY WITH SUBACROMIAL DECOMPRESSION, ROTATOR CUFF REPAIR AND BICEP TENDON REPAIR Right 08/06/2021   Procedure: RIGHT SHOULDER ARTHROSCOPY WITH DEBRIDEMENT, DECOMPRESSION, AND POSSIBLE ROTATOR CUFF REPAIR;  Surgeon: Christena Flake, MD;  Location: [facility];  Service: Orthopedics;  Laterality: Right;   TONSILLECTOMY N/A 09/18/2015   Procedure: TONSILLECTOMY;  Surgeon: Bud Face, MD;  Location: [facility];  Service: ENT;  Laterality: N/A;   TONSILLECTOMY     TUBAL LIGATION     UPPER GI ENDOSCOPY  2011    OB History   No obstetric history on file.      Home Medications    Prior to Admission  medications   Medication Sig Start Date End Date Taking? Authorizing Provider  gatifloxacin (ZYMAXID) 0.5 % SOLN Place 1 drop into the right eye 4 (four) times daily. 07/19/23  Yes [provider]  ketorolac (ACULAR) 0.5 % ophthalmic solution Place 1 drop into the right eye 4 (four) times daily. 07/19/23  Yes [provider]  prednisoLONE acetate (PRED FORTE) 1 % ophthalmic suspension Place 1 drop into the right eye 4 (four) times daily. 07/19/23  Yes [provider]  amLODipine (NORVASC) 10 MG tablet Take 1 tablet (10 mg total) by mouth daily. 03/16/23   Sherlene Shams, MD  atorvastatin (LIPITOR) 20 MG tablet TAKE 1 TABLET BY MOUTH EVERY DAY 01/28/23   Sherlene Shams, MD  carvedilol (COREG) 3.125 MG tablet TAKE 1 TABLET BY MOUTH TWICE A DAY WITH A MEAL 03/16/23   Sherlene Shams, MD  cholecalciferol (VITAMIN D) 1000 UNITS tablet Take 1,000 Units by mouth daily.     [provider]  diphenhydrAMINE (BENADRYL) 50 MG tablet Take 50 mg by mouth at bedtime as needed for allergies.    [provider]  empagliflozin (JARDIANCE) 25 MG TABS tablet Take 1 tablet (25 mg total) by mouth daily. 08/02/23   Sherlene Shams, MD  EPINEPHrine 0.3 mg/0.3 mL IJ SOAJ injection Inject 0.3 mg into the muscle as needed for anaphylaxis. Due to shellfish allergy 06/24/22   Sherlene Shams, MD  fexofenadine (ALLEGRA) 180 MG tablet Take 180 mg by mouth every morning.    [provider]  fluticasone (FLONASE) 50 MCG/ACT nasal spray Place 1 spray into both nostrils daily as needed for allergies. 09/08/22   [provider]  glipiZIDE (GLUCOTROL) 5 MG tablet Take 5 mg by mouth 2 (two) times daily. 12/03/22   [provider]  metFORMIN (GLUCOPHAGE) 1000 MG tablet TAKE 1 TABLET (1,000 MG TOTAL) BY MOUTH TWICE A DAY WITH FOOD 03/16/23   Sherlene Shams, MD  omeprazole (PRILOSEC) 40 MG capsule TAKE 1 CAPSULE BY MOUTH EVERY DAY 01/28/23   Sherlene Shams, MD   spironolactone (ALDACTONE) 25 MG tablet TAKE 1 TABLET (25 MG TOTAL) BY MOUTH DAILY. 05/30/23   Sherlene Shams, MD  tirzepatide [facility]) 2.5 MG/0.5ML Pen Inject 2.5 mg into the skin once a week. 06/16/23   Sherlene Shams, MD  traMADol (ULTRAM) 50 MG tablet Take 1 tablet (50 mg total) by mouth 3 (three) times daily as needed. 06/16/23 12/13/23  Sherlene Shams, MD  valsartan (DIOVAN) 80 MG tablet Take 1 tablet (80 mg total) by mouth daily. 04/05/23   Sherlene Shams, MD  zolpidem (AMBIEN) 5 MG tablet TAKE 1 TABLET BY MOUTH EVERY DAY AT BEDTIME AS NEEDED FOR SLEEP 05/06/23   Sherlene Shams, MD    Family History Family History  Problem Relation Age of Onset   Cancer Mother        colon   Tuberculosis Father    Diabetes Father    Heart disease Father        MI x 2   Heart attack Father    Diabetes Sister        half sister    Social History Social History   Tobacco Use   Smoking status: Former    Current packs/day: 0.00    Average packs/day: 1.5 packs/day for 10.0 years (15.0 ttl pk-yrs)    Types: Cigarettes    Start date: 09/05/1974    Quit date: 09/05/1984    Years since quitting: 38.9   Smokeless tobacco: Never   Tobacco comments:    remote, quit 30 years ago  Vaping Use   Vaping status: Never Used  Substance Use Topics   Alcohol use: Not Currently    Alcohol/week: 0.0 standard drinks of alcohol    Comment: on occasion, martinis maybe twice a year (Christmas & New Years).   Drug use: No     Allergies   Shellfish allergy, Lisinopril, Ozempic (0.25 or 0.5 mg-dose) [semaglutide(0.25 or 0.5mg -dos)], and Penicillins   Review of Systems Review of Systems  Constitutional:  Positive for fatigue. Negative for chills, diaphoresis and fever.  HENT:  Positive for congestion, postnasal drip, rhinorrhea and sore throat. Negative for ear pain, sinus pressure and sinus pain.   Respiratory:  Positive for cough, chest tightness and wheezing. Negative for shortness of breath.    Gastrointestinal:  Negative for abdominal pain, nausea and vomiting.  Musculoskeletal:  Negative for arthralgias and myalgias.  Skin:  Negative for rash.  Neurological:  Negative for weakness and headaches.  Hematological:  Negative for adenopathy.     Physical Exam  Triage Vital Signs ED Triage Vitals  Encounter Vitals Group     BP 08/08/23 1637 115/77     Systolic BP Percentile --      Diastolic BP Percentile --      Pulse Rate 08/08/23 1637 81     Resp 08/08/23 1637 18     Temp 08/08/23 1637 98.5 F (36.9 C)     Temp Source 08/08/23 1637 Oral     SpO2 08/08/23 1637 98 %     Weight --      Height --      Head Circumference --      Peak Flow --      Pain Score 08/08/23 1636 0     Pain Loc --      Pain Education --      Exclude from Growth Chart --    No data found.  Updated Vital Signs BP 115/77 (BP Location: Right Arm)   Pulse 81   Temp 98.5 F (36.9 C) (Oral)   Resp 18   SpO2 98%        Physical Exam Vitals and nursing note reviewed.  Constitutional:      General: She is not in acute distress.    Appearance: Normal appearance. She is obese. She is ill-appearing. She is not toxic-appearing.  HENT:     Head: Normocephalic and atraumatic.     Nose: Congestion present.     Mouth/Throat:     Mouth: Mucous membranes are moist.     Pharynx: Oropharynx is clear.  Eyes:     General: No scleral icterus.       Right eye: No discharge.        Left eye: No discharge.     Conjunctiva/sclera: Conjunctivae normal.  Cardiovascular:     Rate and Rhythm: Normal rate and regular rhythm.     Heart sounds: Normal heart sounds.  Pulmonary:     Effort: Pulmonary effort is normal. No respiratory distress.     Breath sounds: Rhonchi present.  Musculoskeletal:     Cervical back: Neck supple.  Skin:    General: Skin is dry.  Neurological:     General: No focal deficit present.     Mental Status: She is alert. Mental status is at baseline.     Motor: No weakness.      Gait: Gait abnormal.  Psychiatric:        Mood and Affect: Mood normal.      UC Treatments / Results  Labs (all labs ordered are listed, but only abnormal results are displayed) Labs Reviewed - No data to display  EKG   Radiology No results found.  Procedures Procedures (including critical care time)  Medications Ordered in UC Medications - No data to display  Initial Impression / Assessment and Plan / UC Course  I have reviewed the triage vital signs and the nursing notes.  Pertinent labs & imaging results that were available during my care of the patient were reviewed by me and considered in my medical decision making (see chart for details).   72 year old female presents for 4-day history of fatigue, cough, congestion, wheezing and shortness of breath.  Reports temps up to 100 degrees for the first 3 days.  Vitals are normal and stable.  She is ill-appearing but nontoxic.  Is walking with a crutch currently.  On exam is nasal congestion.  Throat is clear.  Scattered rhonchi throughout.  COVID test and chest x-ray obtained.  *** Final  Clinical Impressions(s) / UC Diagnoses   Final diagnoses:  None   Discharge Instructions   None    ED Prescriptions   None    PDMP not reviewed this encounter.

## 2023-08-08 NOTE — ED Triage Notes (Signed)
Pt presents with a cough, wheezing and congestion x 4 days.

## 2023-09-06 ENCOUNTER — Encounter: Payer: Self-pay | Admitting: Pharmacist

## 2023-09-06 NOTE — Progress Notes (Signed)
 Manufacturer Assistance Program (MAP) Application   Manufacturer: [employer] ([employer])  - 2025 enrollment Medication(s): Jardiance   Patient Portion of Application:  11/25: Filled out and placed in front office for patient signature.  11/26: Signed by patient  Income Documentation: N/A - Electronic verification elected.  Provider Portion of Application:  11/25: Provider portion placed in PCP inbox for signature. Signed 11/25 Prescription(s): Electronic Rx sent to [facility] ([employer])   Application Status: Submitted via fax (pending approval) on 11/25 12/18 Fax received from [employer]: Missing information on prescription (product name, HCP signature). Rx was sent electronically so this does not apply.  12/31: Re-faxed application including signed PCP page and copy of pt insurance card + picture of eRx sent previously to [facility]  Next Steps: []    Fax confirmation of approval received  Forwarded to [facility] Patient Advocate Team for future correspondences/re-enrollment.   *[facility] clinic team - Please Addend/update this note as the Next Steps are completed in office*

## 2023-09-07 HISTORY — PX: CATARACT EXTRACTION: SUR2

## 2023-09-11 ENCOUNTER — Encounter: Payer: Self-pay | Admitting: Internal Medicine

## 2023-09-14 ENCOUNTER — Other Ambulatory Visit: Payer: Self-pay | Admitting: Internal Medicine

## 2023-09-19 ENCOUNTER — Ambulatory Visit: Payer: Medicare PPO | Admitting: Internal Medicine

## 2023-09-19 NOTE — Telephone Encounter (Signed)
 Glipizide 5 mg BID is in patients chart but it is a historical medication. Is it okay to send in a refill.

## 2023-09-20 MED ORDER — GLIPIZIDE 5 MG PO TABS: 5.0000 mg | ORAL_TABLET | Freq: Two times a day (BID) | ORAL | 1 refills | Status: DC

## 2023-09-20 NOTE — Addendum Note (Signed)
 Addended by: Sherlene Shams on: 09/20/2023 10:13 AM   Modules accepted: Orders

## 2023-09-22 ENCOUNTER — Encounter: Payer: Self-pay | Admitting: Internal Medicine

## 2023-09-28 ENCOUNTER — Other Ambulatory Visit: Payer: Self-pay | Admitting: Internal Medicine

## 2023-10-07 DIAGNOSIS — H2512 Age-related nuclear cataract, left eye: Secondary | ICD-10-CM | POA: Diagnosis not present

## 2023-10-07 DIAGNOSIS — H2511 Age-related nuclear cataract, right eye: Secondary | ICD-10-CM | POA: Diagnosis not present

## 2023-10-10 ENCOUNTER — Encounter: Payer: Self-pay | Admitting: Internal Medicine

## 2023-10-10 ENCOUNTER — Telehealth: Payer: Self-pay

## 2023-10-10 ENCOUNTER — Ambulatory Visit: Payer: Medicare PPO | Admitting: Internal Medicine

## 2023-10-10 VITALS — BP 122/68 | HR 78 | Ht 65.0 in | Wt 218.2 lb

## 2023-10-10 DIAGNOSIS — E1169 Type 2 diabetes mellitus with other specified complication: Secondary | ICD-10-CM | POA: Diagnosis not present

## 2023-10-10 DIAGNOSIS — Z1231 Encounter for screening mammogram for malignant neoplasm of breast: Secondary | ICD-10-CM

## 2023-10-10 DIAGNOSIS — E785 Hyperlipidemia, unspecified: Secondary | ICD-10-CM

## 2023-10-10 DIAGNOSIS — Z9841 Cataract extraction status, right eye: Secondary | ICD-10-CM | POA: Diagnosis not present

## 2023-10-10 DIAGNOSIS — Z780 Asymptomatic menopausal state: Secondary | ICD-10-CM | POA: Diagnosis not present

## 2023-10-10 DIAGNOSIS — Z6838 Body mass index (BMI) 38.0-38.9, adult: Secondary | ICD-10-CM

## 2023-10-10 DIAGNOSIS — I10 Essential (primary) hypertension: Secondary | ICD-10-CM

## 2023-10-10 DIAGNOSIS — E1121 Type 2 diabetes mellitus with diabetic nephropathy: Secondary | ICD-10-CM | POA: Diagnosis not present

## 2023-10-10 DIAGNOSIS — E66812 Obesity, class 2: Secondary | ICD-10-CM

## 2023-10-10 DIAGNOSIS — Z9049 Acquired absence of other specified parts of digestive tract: Secondary | ICD-10-CM

## 2023-10-10 DIAGNOSIS — N1831 Chronic kidney disease, stage 3a: Secondary | ICD-10-CM | POA: Insufficient documentation

## 2023-10-10 DIAGNOSIS — Z7985 Long-term (current) use of injectable non-insulin antidiabetic drugs: Secondary | ICD-10-CM

## 2023-10-10 LAB — COMPREHENSIVE METABOLIC PANEL
ALT: 19 U/L (ref 0–35)
AST: 15 U/L (ref 0–37)
Albumin: 4.1 g/dL (ref 3.5–5.2)
Alkaline Phosphatase: 84 U/L (ref 39–117)
BUN: 18 mg/dL (ref 6–23)
CO2: 26 meq/L (ref 19–32)
Calcium: 9.5 mg/dL (ref 8.4–10.5)
Chloride: 104 meq/L (ref 96–112)
Creatinine, Ser: 0.99 mg/dL (ref 0.40–1.20)
GFR: 57.59 mL/min — ABNORMAL LOW (ref >60.00)
Glucose, Bld: 138 mg/dL — ABNORMAL HIGH (ref 70–99)
Potassium: 4.4 meq/L (ref 3.5–5.1)
Sodium: 139 meq/L (ref 135–145)
Total Bilirubin: 0.5 mg/dL (ref 0.2–1.2)
Total Protein: 6.4 g/dL (ref 6.0–8.3)

## 2023-10-10 LAB — LDL CHOLESTEROL, DIRECT: Direct LDL: 46 mg/dL

## 2023-10-10 LAB — LIPID PANEL
Cholesterol: 108 mg/dL (ref 0–200)
HDL: 46.6 mg/dL (ref >39.00)
LDL Cholesterol: 42 mg/dL (ref 0–99)
NonHDL: 61.78
Total CHOL/HDL Ratio: 2
Triglycerides: 98 mg/dL (ref 0.0–149.0)
VLDL: 19.6 mg/dL (ref 0.0–40.0)

## 2023-10-10 LAB — HEMOGLOBIN A1C: Hgb A1c MFr Bld: 6.8 % — ABNORMAL HIGH (ref 4.6–6.5)

## 2023-10-10 MED ORDER — AMLODIPINE BESYLATE 10 MG PO TABS: 10.0000 mg | ORAL_TABLET | Freq: Every day | ORAL | 1 refills | Status: DC

## 2023-10-10 NOTE — Patient Instructions (Signed)
  I recommend a medication to help you lose weight and manage your diabetes; it's  called Mounjaro.   The dose for the first 4 weekly doses is 2.5 mg .  You may have mild nausea on the first or second day but this should resolve.  If not  ,  stop the medication. If your blood sugars start to drop too low,  STOP THE GLIPIZIDE.   As long as you are losing weight,  you can continue the dose you are on .  Only request tn  increase in  he dose to  5.0 mg  after 4 weeks if you ave not been losing weight .  You will need to call us for that increase.[name]

## 2023-10-10 NOTE — Telephone Encounter (Signed)
Medication Samples have been provided to the patient.  Drug name: Greggory Keen       Strength: 2.5 mg        Qty: 1 box  LOT: [id number]  Exp.Date: 03/12/2025  Dosing instructions: Inject 2.5 mg into skin once weekly.   The patient has been instructed regarding the correct time, dose, and frequency of taking this medication, including desired effects and most common side effects.   Laddie Math 10:31 AM 10/10/2023

## 2023-10-10 NOTE — Assessment & Plan Note (Signed)
Trial of mounjaro offered and accepted. Samples of 2.5 mg

## 2023-10-10 NOTE — Progress Notes (Unsigned)
Subjective:  Patient ID: Cheryl Wallace, female    DOB: 1951/05/12  Age: 73 y.o. MRN: [id number]  CC: The primary encounter diagnosis was Encounter for screening mammogram for malignant neoplasm of breast. Diagnoses of Postmenopausal estrogen deficiency, Type 2 diabetes mellitus with nephropathy (HCC), Essential hypertension, Hyperlipidemia associated with type 2 diabetes mellitus (HCC), and Status post right cataract extraction were also pertinent to this visit.   HPI Cheryl Wallace presents for  Chief Complaint  Patient presents with   Medical Management of Chronic Issues    3 month follow up    1) type 2 DM:   T2DM:  She  feels generally well,  But is not  exercising regularly or trying to lose weight. Checking  blood sugars less than once daily at variable times, and has no history  of  hypoglycemic event. .  BS have been under 130 fasting and < 150 post prandially.  Denies any recent hypoglyemic events.  Taking   Jardiance 25, g  glipizide 6 mg bi,  and metformin 19147 mg bid,    willing to try mounjaro. Following a carbohydrate modified diet 6 days per week. Denies numbness, burning and tingling of extremities. Appetite is good.     2) Treated by urgent care for a viral URI  on Dec 3. Chest x ray done and normal .   she was given albuterol MDI< cough suppressant,  no prednisone or abx  .  Cough was dry and lasted 1 month  3) Chronic diarrhea , post cholecystectomy.    Outpatient Medications Prior to Visit  Medication Sig Dispense Refill   albuterol (VENTOLIN HFA) 108 (90 Base) MCG/ACT inhaler Inhale 1-2 puffs into the lungs every 6 (six) hours as needed for wheezing or shortness of breath. 1 g 0   amLODipine (NORVASC) 10 MG tablet Take 1 tablet (10 mg total) by mouth daily. 90 tablet 1   atorvastatin (LIPITOR) 20 MG tablet TAKE 1 TABLET BY MOUTH EVERY DAY 90 tablet 3   carvedilol (COREG) 3.125 MG tablet TAKE 1 TABLET BY MOUTH TWICE A DAY WITH FOOD 180 tablet 1   cholecalciferol  (VITAMIN D) 1000 UNITS tablet Take 1,000 Units by mouth daily.      diphenhydrAMINE (BENADRYL) 50 MG tablet Take 50 mg by mouth at bedtime as needed for allergies.     empagliflozin (JARDIANCE) 25 MG TABS tablet Take 1 tablet (25 mg total) by mouth daily. 90 tablet 3   EPINEPHrine 0.3 mg/0.3 mL IJ SOAJ injection Inject 0.3 mg into the muscle as needed for anaphylaxis. Due to shellfish allergy 1 each 1   fexofenadine (ALLEGRA) 180 MG tablet Take 180 mg by mouth every morning.     fluticasone (FLONASE) 50 MCG/ACT nasal spray Place 1 spray into both nostrils daily as needed for allergies.     gatifloxacin (ZYMAXID) 0.5 % SOLN Place 1 drop into the right eye 4 (four) times daily.     glipiZIDE (GLUCOTROL) 5 MG tablet Take 1 tablet (5 mg total) by mouth 2 (two) times daily. 180 tablet 1   ketorolac (ACULAR) 0.5 % ophthalmic solution Place 1 drop into the right eye 4 (four) times daily.     metFORMIN (GLUCOPHAGE) 1000 MG tablet TAKE 1 TABLET (1,000 MG TOTAL) BY MOUTH TWICE A DAY WITH FOOD 180 tablet 1   omeprazole (PRILOSEC) 40 MG capsule TAKE 1 CAPSULE BY MOUTH EVERY DAY 90 capsule 3   prednisoLONE acetate (PRED FORTE) 1 % ophthalmic suspension Place  1 drop into the right eye 4 (four) times daily.     promethazine-dextromethorphan (PROMETHAZINE-DM) 6.25-15 MG/5ML syrup Take 5 mLs by mouth 4 (four) times daily as needed. 118 mL 0   spironolactone (ALDACTONE) 25 MG tablet TAKE 1 TABLET (25 MG TOTAL) BY MOUTH DAILY. 90 tablet 3   tirzepatide (MOUNJARO) 2.5 MG/0.5ML Pen Inject 2.5 mg into the skin once a week. 2 mL 2   traMADol (ULTRAM) 50 MG tablet Take 1 tablet (50 mg total) by mouth 3 (three) times daily as needed. 90 tablet 5   valsartan (DIOVAN) 80 MG tablet Take 1 tablet (80 mg total) by mouth daily. 90 tablet 3   zolpidem (AMBIEN) 5 MG tablet TAKE 1 TABLET BY MOUTH EVERY DAY AT BEDTIME AS NEEDED FOR SLEEP 30 tablet 5   No facility-administered medications prior to visit.    Review of  Systems;  Patient denies headache, fevers, malaise, unintentional weight loss, skin rash, eye pain, sinus congestion and sinus pain, sore throat, dysphagia,  hemoptysis , cough, dyspnea, wheezing, chest pain, palpitations, orthopnea, edema, abdominal pain, nausea, melena, diarrhea, constipation, flank pain, dysuria, hematuria, urinary  Frequency, nocturia, numbness, tingling, seizures,  Focal weakness, Loss of consciousness,  Tremor, insomnia, depression, anxiety, and suicidal ideation.      Objective:  BP 122/68   Pulse 78   Ht 5\' 5"  (1.651 m)   Wt 218 lb 3.2 oz (99 kg)   SpO2 95%   BMI 36.31 kg/m   BP Readings from Last 3 Encounters:  10/10/23 122/68  08/08/23 115/77  06/16/23 126/66    Wt Readings from Last 3 Encounters:  10/10/23 218 lb 3.2 oz (99 kg)  06/16/23 219 lb 6.4 oz (99.5 kg)  03/16/23 217 lb (98.4 kg)    Physical Exam  Lab Results  Component Value Date   HGBA1C 6.6 (H) 06/16/2023   HGBA1C 6.6 (H) 03/16/2023   HGBA1C 5.7 12/13/2022    Lab Results  Component Value Date   CREATININE 0.85 06/16/2023   CREATININE 0.97 03/16/2023   CREATININE 0.93 12/13/2022    Lab Results  Component Value Date   WBC 7.4 07/15/2023   HGB 11.3 (L) 07/15/2023   HCT 37.3 07/15/2023   PLT 284 07/15/2023   GLUCOSE 137 (H) 06/16/2023   CHOL 101 06/16/2023   TRIG 104.0 06/16/2023   HDL 44.30 06/16/2023   LDLDIRECT 42.0 06/16/2023   LDLCALC 36 06/16/2023   ALT 18 06/16/2023   AST 16 06/16/2023   NA 139 06/16/2023   K 3.9 06/16/2023   CL 103 06/16/2023   CREATININE 0.85 06/16/2023   BUN 17 06/16/2023   CO2 28 06/16/2023   TSH 1.272 06/24/2021   HGBA1C 6.6 (H) 06/16/2023   MICROALBUR 1.0 03/16/2023    DG Chest 2 View Result Date: 08/08/2023 CLINICAL DATA:  Cough and wheezing EXAM: CHEST - 2 VIEW COMPARISON:  08/06/2021, CT 10/26/2022, 03/06/2020 FINDINGS: The heart size and mediastinal contours are within normal limits. Both lungs are clear. The visualized skeletal  structures are unremarkable. Left hilar prominence appears to correspond to vasculature on CT imaging IMPRESSION: No active cardiopulmonary disease. Electronically Signed   By: Jasmine Pang M.D.   On: 08/08/2023 21:08    Assessment & Plan:  .Encounter for screening mammogram for malignant neoplasm of breast  Postmenopausal estrogen deficiency  Type 2 diabetes mellitus with nephropathy (HCC)  Essential hypertension  Hyperlipidemia associated with type 2 diabetes mellitus (HCC)  Status post right cataract extraction  I provided 30 minutes of face-to-face time during this encounter reviewing patient's last visit with me, patient's  most recent visit with cardiology,  nephrology,  and neurology,  recent surgical and non surgical procedures, previous  labs and imaging studies, counseling on currently addressed issues,  and post visit ordering to diagnostics and therapeutics .   Follow-up: No follow-ups on file.   Sherlene Shams, MD

## 2023-10-11 ENCOUNTER — Encounter: Payer: Self-pay | Admitting: Internal Medicine

## 2023-10-11 NOTE — Assessment & Plan Note (Signed)
 Compicated by obesity and HTN as well.   On cGlycemic control is excellent urrent medications but BMI is > 35.     She will continue jARDIANCE , Metformin   and glizpide,  and is willing to try Mounjaro  and was given a sample pen today.   Advised to stop glipizide  if BS become low on Mounjaro .   Her copay per pharmacy is $40.   Continue statin therapy for CAD risk reduction and on ACE/ARB for renal protection and hypertension   Lab Results  Component Value Date   HGBA1C 6.8 (H) 10/10/2023   Lab Results  Component Value Date   MICROALBUR 1.0 03/16/2023   MICROALBUR 6.1 (H) 11/20/2021

## 2023-10-11 NOTE — Assessment & Plan Note (Addendum)
 GFR is stable and has ranged from 58 ml/min to 69 ml/min. .  She is avoiding NSAIDS and taking an ARB   Lab Results  Component Value Date   CREATININE 0.99 10/10/2023   Lab Results  Component Value Date   MICROALBUR 1.0 03/16/2023   MICROALBUR 6.1 (H) 11/20/2021

## 2023-10-11 NOTE — Assessment & Plan Note (Signed)
She continues to have diarrhea .  Will reassess after Mounjaro start and add questran if needed

## 2023-10-13 DIAGNOSIS — H2511 Age-related nuclear cataract, right eye: Secondary | ICD-10-CM | POA: Diagnosis not present

## 2023-10-14 ENCOUNTER — Encounter: Payer: Self-pay | Admitting: Oncology

## 2023-10-14 ENCOUNTER — Inpatient Hospital Stay: Payer: Medicare PPO | Admitting: Oncology

## 2023-10-14 ENCOUNTER — Inpatient Hospital Stay: Payer: Medicare PPO | Attending: Oncology

## 2023-10-14 ENCOUNTER — Telehealth: Payer: Self-pay | Admitting: *Deleted

## 2023-10-14 VITALS — BP 148/84 | HR 88 | Temp 98.0°F | Resp 18 | Wt 214.0 lb

## 2023-10-14 DIAGNOSIS — D509 Iron deficiency anemia, unspecified: Secondary | ICD-10-CM | POA: Diagnosis not present

## 2023-10-14 LAB — CBC
HCT: 39.8 % (ref 36.0–46.0)
Hemoglobin: 12.2 g/dL (ref 12.0–15.0)
MCH: 26.1 pg (ref 26.0–34.0)
MCHC: 30.7 g/dL (ref 30.0–36.0)
MCV: 85 fL (ref 80.0–100.0)
Platelets: 306 10*3/uL (ref 150–400)
RBC: 4.68 MIL/uL (ref 3.87–5.11)
RDW: 15.7 % — ABNORMAL HIGH (ref 11.5–15.5)
WBC: 8 10*3/uL (ref 4.0–10.5)
nRBC: 0 % (ref 0.0–0.2)

## 2023-10-14 LAB — FERRITIN: Ferritin: 11 ng/mL (ref 11–307)

## 2023-10-14 LAB — IRON AND TIBC
Iron: 43 ug/dL (ref 28–170)
Saturation Ratios: 9 % — ABNORMAL LOW (ref 10.4–31.8)
TIBC: 465 ug/dL — ABNORMAL HIGH (ref 250–450)
UIBC: 422 ug/dL

## 2023-10-14 NOTE — Telephone Encounter (Signed)
 Patient is left a message that she is getting IV iron  into the appointment she will not be able to mediate as she would like somebody else to call her and have it rescheduled

## 2023-10-14 NOTE — Progress Notes (Signed)
 Patient here for oncology follow-up appointment, expresses complaints of chronic pain

## 2023-10-17 ENCOUNTER — Encounter: Payer: Self-pay | Admitting: Oncology

## 2023-10-17 NOTE — Progress Notes (Signed)
 Hematology/Oncology Consult note [facility]  Telephone:([phone] [phone]  Patient Care Team: Marylynn Verneita CROME, MD as PCP - General (Internal Medicine) Dessa Reyes ORN, MD (General Surgery) Geronimo Manuelita SAUNDERS, [facility] (Pharmacist)   Name of the patient: Cheryl Wallace  [id number]  08/28/1951   Date of visit: 10/17/23  Diagnosis-iron  deficiency anemia  Chief complaint/ Reason for visit-routine follow-up of iron  deficiency anemia  Heme/Onc history: patient is a 73 year old female with a past medical history significant for hypertension type 2 diabetes CKD and she has been referred to us  for anemia.  Her most recent CBC from 10/3/2022Showed white count of 10, H&H of 10.3/34.1 with an MCV of 73.7 and a platelet count of 331.  Her prior CBC from September 2022 showed an H&H of 9.8/23.9.  Patient has had chronic microcytosis with evidence of iron  deficiency in the past.  No recent iron  studies checked.  Patient denies any blood loss in her stool or urine.  Denies any dark melanotic stools.  Family history positive for colon cancer in her mother in her 49s.  She has had a colonoscopy in June 2021 by Dr. Aundria which showed nonbleeding internal hemorrhoids but otherwise normal.  Patient had an EGD in 2023 which showed normal esophagus and erythema in the stomach.  Negative for malignancy on biopsy.   patient is currently receiving Venofer .  Results of blood work from 06/24/2021 were as follows: CBC showed an H&H of 10.4/35.6 with an MCV of 76 and a platelet count of 306.  Ferritin levels were low at 7.  Iron  saturation low at 7% with an elevated TIBC of 568.  B12 folate TSH normal.  Haptoglobin normal.  Myeloma panel showed no M protein.    Interval history-patient reports ongoing fatigue but is otherwise doing well and denies any blood loss in her stool or urine  ECOG PS- 0 Pain scale- 0   Review of systems- Review of Systems  Constitutional:  Negative for  chills, fever, malaise/fatigue and weight loss.  HENT:  Negative for congestion, ear discharge and nosebleeds.   Eyes:  Negative for blurred vision.  Respiratory:  Negative for cough, hemoptysis, sputum production, shortness of breath and wheezing.   Cardiovascular:  Negative for chest pain, palpitations, orthopnea and claudication.  Gastrointestinal:  Negative for abdominal pain, blood in stool, constipation, diarrhea, heartburn, melena, nausea and vomiting.  Genitourinary:  Negative for dysuria, flank pain, frequency, hematuria and urgency.  Musculoskeletal:  Negative for back pain, joint pain and myalgias.  Skin:  Negative for rash.  Neurological:  Negative for dizziness, tingling, focal weakness, seizures, weakness and headaches.  Endo/Heme/Allergies:  Does not bruise/bleed easily.  Psychiatric/Behavioral:  Negative for depression and suicidal ideas. The patient does not have insomnia.       Allergies  Allergen Reactions   Shellfish Allergy Swelling    Swelling of face, lips and hands. No difficulty breathing. No problems with topical betadine   Lisinopril  Cough    cough   Ozempic  (0.25 Or 0.5 Mg-Dose) [Semaglutide (0.25 Or 0.5mg -Dos)] Nausea And Vomiting   Penicillins Rash     Past Medical History:  Diagnosis Date   Acute meniscal tear, medial, right, sequela 09/22/2020   Allergic rhinitis    Anemia    iron  deficiency   Arthritis    osteoarthritis right knee   Chronic kidney disease    nephropathy d/t diabetes   Complication of anesthesia 1981   While pt was under anesthesia, pt had an asthema  attack during BTL due to having the flu   Depression, major, single episode, mild (HCC) 09/04/2016   Elbow tendonitis    a. bilat, s/p surgery.   Essential hypertension    Functional ovarian cysts 2011   a. s/p TAH (1994);  b. s/p oopherectomy (~2011)   Gallstones    GERD (gastroesophageal reflux disease)    History of kidney stones    Hyperlipidemia    Insomnia    Low  grade fever    Obesity    Pneumonia    Polyarthralgia    Seasonal allergies    Seizure (HCC)    last episode at 73 years old. nothing since. unknown etiology   Tinnitus    Type 2 diabetes mellitus (HCC)    a.  Hemoglobin A1c in July 2016, 6.7.     Past Surgical History:  Procedure Laterality Date   ABDOMINAL HYSTERECTOMY  1994   CLOSED MANIPULATION SHOULDER WITH STERIOD INJECTION Right 01/06/2022   Procedure: CLOSED MANIPULATION SHOULDER WITH STEROID INJECTION;  Surgeon: Edie Norleen PARAS, MD;  Location: [facility];  Service: Orthopedics;  Laterality: Right;   COLONOSCOPY  05/2007   Non-bleeding internal hemorhoids identified.   COLONOSCOPY WITH PROPOFOL  N/A 02/25/2020   Procedure: COLONOSCOPY WITH PROPOFOL ;  Surgeon: Toledo, Ladell POUR, MD;  Location: [facility];  Service: Gastroenterology;  Laterality: N/A;   ELBOW SURGERY Bilateral    d/t tendonitis   ESOPHAGOGASTRODUODENOSCOPY (EGD) WITH PROPOFOL  N/A 10/28/2021   Procedure: ESOPHAGOGASTRODUODENOSCOPY (EGD) WITH PROPOFOL ;  Surgeon: Toledo, Ladell POUR, MD;  Location: [facility];  Service: Gastroenterology;  Laterality: N/A;  DM   FRACTURE SURGERY Right    wrist/hand with plate   KNEE ARTHROSCOPY Right 11/18/2020   Procedure: Right knee arthroscopy, partial medial meniscectomy;  Surgeon: Kathlynn Sharper, MD;  Location: [facility];  Service: Orthopedics;  Laterality: Right;   NASAL SINUS SURGERY  09/18/2015   Procedure: IMAGE GUIDED SINUS SURGERY, BILATERAL MAXILLARY BALLOON SINUPLASTY, BILATERAL FRONTAL BALLOON SINUPLASTY, RIGHT SPHENOID  SINUPLASTY, RIGHT CONCHABULLOSA RESECTION;  Surgeon: Carolee Hunter, MD;  Location: [facility];  Service: ENT;;   OOPHORECTOMY  04/2010   bilateral, 7 cm benign tumor left ovary   OPEN REDUCTION INTERNAL FIXATION (ORIF) DISTAL RADIAL FRACTURE Right 02/07/2017   Procedure: OPEN REDUCTION INTERNAL FIXATION (ORIF) DISTAL RADIAL FRACTURE;  Surgeon: Kathlynn Sharper, MD;  Location: [facility];  Service:  Orthopedics;  Laterality: Right;   ORIF TIBIA PLATEAU Right 11/03/2022   Procedure: OPEN REDUCTION INTERNAL FIXATION (ORIF) TIBIAL PLATEAU;  Surgeon: Kendal Franky SQUIBB, MD;  Location: [facility];  Service: Orthopedics;  Laterality: Right;   PARTIAL HYSTERECTOMY  1994   still has ovaries, removed for fibroids   SHOULDER ARTHROSCOPY WITH SUBACROMIAL DECOMPRESSION, ROTATOR CUFF REPAIR AND BICEP TENDON REPAIR Right 08/06/2021   Procedure: RIGHT SHOULDER ARTHROSCOPY WITH DEBRIDEMENT, DECOMPRESSION, AND POSSIBLE ROTATOR CUFF REPAIR;  Surgeon: Edie Norleen PARAS, MD;  Location: [facility];  Service: Orthopedics;  Laterality: Right;   TONSILLECTOMY N/A 09/18/2015   Procedure: TONSILLECTOMY;  Surgeon: Carolee Hunter, MD;  Location: [facility];  Service: ENT;  Laterality: N/A;   TONSILLECTOMY     TUBAL LIGATION     UPPER GI ENDOSCOPY  2011    Social History   Socioeconomic History   Marital status: Divorced    Spouse name: Not on file   Number of children: Not on file   Years of education: Not on file   Highest education level: GED or equivalent  Occupational History   Occupation: school and bus driver  Comment: still working  Tobacco Use   Smoking status: Former    Current packs/day: 0.00    Average packs/day: 1.5 packs/day for 10.0 years (15.0 ttl pk-yrs)    Types: Cigarettes    Start date: 09/05/1974    Quit date: 09/05/1984    Years since quitting: 39.1   Smokeless tobacco: Never   Tobacco comments:    remote, quit 30 years ago  Vaping Use   Vaping status: Never Used  Substance and Sexual Activity   Alcohol use: Not Currently    Alcohol/week: 0.0 standard drinks of alcohol    Comment: on occasion, martinis maybe twice a year (Christmas & New Years).   Drug use: No   Sexual activity: Not Currently  Other Topics Concern   Not on file  Social History Narrative   Patient lives alone. Currently working. Daughter lives somewhat nearby.    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      Does not routinely exercise.  Always uses seat belts.  Has well water.   Social Drivers of Corporate Investment Banker Strain: Low Risk  (09/15/2023)   Overall Financial Resource Strain (CARDIA)    Difficulty of Paying Living Expenses: Not hard at all  Food Insecurity: No Food Insecurity (09/15/2023)   Hunger Vital Sign    Worried About Running Out of Food in the Last Year: Never true    Ran Out of Food in the Last Year: Never true  Transportation Needs: Unmet Transportation Needs (09/15/2023)   PRAPARE - Administrator, Civil Service (Medical): Patient declined    Lack of Transportation (Non-Medical): Yes  Physical Activity: Unknown (09/15/2023)   Exercise Vital Sign    Days of Exercise per Week: 0 days    Minutes of Exercise per Session: Not on file  Stress: Stress Concern Present (09/15/2023)   [employer] of Occupational Health - Occupational Stress Questionnaire    Feeling of Stress : To some extent  Social Connections: Unknown (09/15/2023)   Social Connection and Isolation Panel [NHANES]    Frequency of Communication with Friends and Family: Once a week    Frequency of Social Gatherings with Friends and Family: Patient declined    Attends Religious Services: Never    Database Administrator or Organizations: No    Attends Engineer, Structural: Not on file    Marital Status: Divorced  Intimate Partner Violence: Not At Risk (11/03/2022)   Humiliation, Afraid, Rape, and Kick questionnaire    Fear of Current or Ex-Partner: No    Emotionally Abused: No    Physically Abused: No    Sexually Abused: No    Family History  Problem Relation Age of Onset   Cancer Mother        colon   Tuberculosis Father    Diabetes Father    Heart disease Father        MI x 2   Heart attack Father    Diabetes Sister        half sister     Current Outpatient Medications:    albuterol  (VENTOLIN  HFA) 108 (90 Base) MCG/ACT inhaler, Inhale 1-2 puffs into the lungs every 6 (six)  hours as needed for wheezing or shortness of breath., Disp: 1 g, Rfl: 0   amLODipine  (NORVASC ) 10 MG tablet, Take 1 tablet (10 mg total) by mouth daily., Disp: 90 tablet, Rfl: 1   atorvastatin  (LIPITOR) 20 MG tablet, TAKE 1 TABLET BY MOUTH EVERY DAY,  Disp: 90 tablet, Rfl: 3   carvedilol  (COREG ) 3.125 MG tablet, TAKE 1 TABLET BY MOUTH TWICE A DAY WITH FOOD, Disp: 180 tablet, Rfl: 1   cholecalciferol  (VITAMIN D ) 1000 UNITS tablet, Take 1,000 Units by mouth daily. , Disp: , Rfl:    diphenhydrAMINE  (BENADRYL ) 50 MG tablet, Take 50 mg by mouth at bedtime as needed for allergies., Disp: , Rfl:    empagliflozin  (JARDIANCE ) 25 MG TABS tablet, Take 1 tablet (25 mg total) by mouth daily., Disp: 90 tablet, Rfl: 3   EPINEPHrine  0.3 mg/0.3 mL IJ SOAJ injection, Inject 0.3 mg into the muscle as needed for anaphylaxis. Due to shellfish allergy, Disp: 1 each, Rfl: 1   fexofenadine (ALLEGRA) 180 MG tablet, Take 180 mg by mouth every morning., Disp: , Rfl:    fluticasone  (FLONASE ) 50 MCG/ACT nasal spray, Place 1 spray into both nostrils daily as needed for allergies., Disp: , Rfl:    gatifloxacin (ZYMAXID) 0.5 % SOLN, Place 1 drop into the right eye 4 (four) times daily., Disp: , Rfl:    glipiZIDE  (GLUCOTROL ) 5 MG tablet, Take 1 tablet (5 mg total) by mouth 2 (two) times daily., Disp: 180 tablet, Rfl: 1   ketorolac (ACULAR) 0.5 % ophthalmic solution, Place 1 drop into the right eye 4 (four) times daily., Disp: , Rfl:    metFORMIN  (GLUCOPHAGE ) 1000 MG tablet, TAKE 1 TABLET (1,000 MG TOTAL) BY MOUTH TWICE A DAY WITH FOOD, Disp: 180 tablet, Rfl: 1   omeprazole  (PRILOSEC ) 40 MG capsule, TAKE 1 CAPSULE BY MOUTH EVERY DAY, Disp: 90 capsule, Rfl: 3   prednisoLONE acetate (PRED FORTE) 1 % ophthalmic suspension, Place 1 drop into the right eye 4 (four) times daily., Disp: , Rfl:    promethazine -dextromethorphan (PROMETHAZINE -DM) 6.25-15 MG/5ML syrup, Take 5 mLs by mouth 4 (four) times daily as needed., Disp: 118 mL, Rfl: 0    spironolactone  (ALDACTONE ) 25 MG tablet, TAKE 1 TABLET (25 MG TOTAL) BY MOUTH DAILY., Disp: 90 tablet, Rfl: 3   tirzepatide  (MOUNJARO ) 2.5 MG/0.5ML Pen, Inject 2.5 mg into the skin once a week., Disp: 2 mL, Rfl: 2   traMADol  (ULTRAM ) 50 MG tablet, Take 1 tablet (50 mg total) by mouth 3 (three) times daily as needed., Disp: 90 tablet, Rfl: 5   valsartan  (DIOVAN ) 80 MG tablet, Take 1 tablet (80 mg total) by mouth daily., Disp: 90 tablet, Rfl: 3   zolpidem  (AMBIEN ) 5 MG tablet, TAKE 1 TABLET BY MOUTH EVERY DAY AT BEDTIME AS NEEDED FOR SLEEP, Disp: 30 tablet, Rfl: 5  Physical exam:  Vitals:   10/14/23 1028  BP: (!) 148/84  Pulse: 88  Resp: 18  Temp: 98 F (36.7 C)  TempSrc: Oral  SpO2: 96%  Weight: 214 lb (97.1 kg)   Physical Exam Cardiovascular:     Rate and Rhythm: Normal rate and regular rhythm.     Heart sounds: Normal heart sounds.  Pulmonary:     Effort: Pulmonary effort is normal.     Breath sounds: Normal breath sounds.  Skin:    General: Skin is warm and dry.  Neurological:     Mental Status: She is alert and oriented to person, place, and time.         Latest Ref Rng & Units 10/10/2023    9:14 AM  CMP  Glucose 70 - 99 mg/dL 861   BUN 6 - 23 mg/dL 18   Creatinine 9.59 - 1.20 mg/dL 9.00   Sodium 864 - 854 mEq/L 139  Potassium 3.5 - 5.1 mEq/L 4.4   Chloride 96 - 112 mEq/L 104   CO2 19 - 32 mEq/L 26   Calcium  8.4 - 10.5 mg/dL 9.5   Total Protein 6.0 - 8.3 g/dL 6.4   Total Bilirubin 0.2 - 1.2 mg/dL 0.5   Alkaline Phos 39 - 117 U/L 84   AST 0 - 37 U/L 15   ALT 0 - 35 U/L 19       Latest Ref Rng & Units 10/14/2023    9:31 AM  CBC  WBC 4.0 - 10.5 K/uL 8.0   Hemoglobin 12.0 - 15.0 g/dL 87.7   Hematocrit 63.9 - 46.0 % 39.8   Platelets 150 - 400 K/uL 306      Assessment and plan- Patient is a 73 y.o. female for routine follow-up of iron  deficiency anemia  Patient is not presently anemic with an H&H of 12.2 /29.8.  However her ferritin levels have declined over  the last 1 year from 45 in February 2024 presently to 11.  TIBC is elevated at 465 with an iron  saturation of 9%.  Patient would like to proceed with IV iron .  I will offer her 5 doses of Venofer  based on what her insurance will approve.  CBC ferritin and iron  studies in 3 and 6 months and I will see her back in 6 months   Visit Diagnosis 1. Iron  deficiency anemia, unspecified iron  deficiency anemia type      Dr. Annah Skene, MD, MPH [facility] at [facility] [phone] 10/17/2023 5:37 PM

## 2023-10-19 ENCOUNTER — Inpatient Hospital Stay: Payer: Medicare PPO

## 2023-10-19 VITALS — BP 110/57 | HR 82 | Temp 97.2°F | Resp 18

## 2023-10-19 DIAGNOSIS — D508 Other iron deficiency anemias: Secondary | ICD-10-CM

## 2023-10-19 DIAGNOSIS — D509 Iron deficiency anemia, unspecified: Secondary | ICD-10-CM | POA: Diagnosis not present

## 2023-10-19 MED ORDER — IRON SUCROSE 20 MG/ML IV SOLN
200.0000 mg | INTRAVENOUS | Status: DC
  Administered 2023-10-19: 200 mg via INTRAVENOUS

## 2023-10-19 NOTE — Patient Instructions (Signed)

## 2023-10-21 ENCOUNTER — Inpatient Hospital Stay: Payer: Medicare PPO

## 2023-10-21 VITALS — BP 127/68 | HR 80 | Temp 98.8°F | Resp 16

## 2023-10-21 DIAGNOSIS — D508 Other iron deficiency anemias: Secondary | ICD-10-CM

## 2023-10-21 DIAGNOSIS — D509 Iron deficiency anemia, unspecified: Secondary | ICD-10-CM | POA: Diagnosis not present

## 2023-10-21 MED ORDER — IRON SUCROSE 20 MG/ML IV SOLN
200.0000 mg | INTRAVENOUS | Status: DC
  Administered 2023-10-21: 200 mg via INTRAVENOUS
  Filled 2023-10-21: qty 10

## 2023-10-21 NOTE — Progress Notes (Signed)
Pt tolerated treatment without concerns.  VSS.  Pt refused 30 minute post observation.  Pt understands risks.

## 2023-10-24 ENCOUNTER — Inpatient Hospital Stay: Payer: Medicare PPO

## 2023-10-24 VITALS — BP 111/57 | HR 67 | Temp 97.0°F | Resp 17

## 2023-10-24 DIAGNOSIS — D508 Other iron deficiency anemias: Secondary | ICD-10-CM

## 2023-10-24 DIAGNOSIS — D509 Iron deficiency anemia, unspecified: Secondary | ICD-10-CM | POA: Diagnosis not present

## 2023-10-24 MED ORDER — IRON SUCROSE 20 MG/ML IV SOLN
200.0000 mg | INTRAVENOUS | Status: DC
  Administered 2023-10-24: 200 mg via INTRAVENOUS

## 2023-10-24 MED ORDER — SODIUM CHLORIDE 0.9% FLUSH
10.0000 mL | Freq: Once | INTRAVENOUS | Status: AC | PRN
Start: 2023-10-24 — End: 2023-10-24
  Administered 2023-10-24: 10 mL
  Filled 2023-10-24: qty 10

## 2023-10-26 ENCOUNTER — Inpatient Hospital Stay: Payer: Medicare PPO

## 2023-10-28 ENCOUNTER — Inpatient Hospital Stay: Payer: Medicare PPO

## 2023-10-30 ENCOUNTER — Other Ambulatory Visit: Payer: Self-pay | Admitting: Internal Medicine

## 2023-11-01 ENCOUNTER — Telehealth: Payer: Self-pay | Admitting: Oncology

## 2023-11-01 ENCOUNTER — Inpatient Hospital Stay: Payer: Medicare PPO

## 2023-11-01 NOTE — Telephone Encounter (Signed)
 Patient called to reschedule iron infusion from today. She states she is sick.  Appointment rescheduled as requested

## 2023-11-02 ENCOUNTER — Ambulatory Visit (INDEPENDENT_AMBULATORY_CARE_PROVIDER_SITE_OTHER): Payer: Medicare PPO | Admitting: *Deleted

## 2023-11-02 VITALS — Ht 65.0 in | Wt 220.0 lb

## 2023-11-02 DIAGNOSIS — Z0000 Encounter for general adult medical examination without abnormal findings: Secondary | ICD-10-CM | POA: Diagnosis not present

## 2023-11-02 NOTE — Patient Instructions (Signed)
 Referrals/Orders/Follow-Ups/Clinician Recommendations: Please call and schedule your Mammogram and Bone Density which were ordered 10/10/23 You have an order for:  []   2D Mammogram  [x]   3D Mammogram  [x]   Bone Density     Please call for appointment:   [facility] [address] [place], [place] [address] [phone]  Ms. Dorval , Thank you for taking time to come for your Medicare Wellness Visit. I appreciate your ongoing commitment to your health goals. Please review the following plan we discussed and let me know if I can assist you in the future.   Advanced directives: (Declined) Advance directive discussed with you today. Even though you declined this today, please call our office should you change your mind, and we can give you the proper paperwork for you to fill out.  Next Medicare Annual Wellness Visit scheduled for next year: Yes 11/05/24 @ 10:10  This is a list of the screening recommended for you and due dates:  Health Maintenance  Topic Date Due   COVID-19 Vaccine (6 - 2024-25 season) 05/08/2023   Mammogram  07/06/2023   Yearly kidney health urinalysis for diabetes  03/15/2024   Eye exam for diabetics  03/17/2024   Hemoglobin A1C  04/08/2024   Complete foot exam   06/15/2024   Yearly kidney function blood test for diabetes  10/09/2024   Medicare Annual Wellness Visit  11/01/2024   Colon Cancer Screening  02/24/2025   DTaP/Tdap/Td vaccine (3 - Td or Tdap) 01/07/2028   Pneumonia Vaccine  Completed   Flu Shot  Completed   DEXA scan (bone density measurement)  Completed   Hepatitis C Screening  Completed   Zoster (Shingles) Vaccine  Completed   HPV Vaccine  Aged Out    Advanced directives: (Declined) Advance directive discussed with you today. Even though you declined this today, please call our office should you change your mind, and we can give you the proper paperwork for you to fill out.  Next Medicare Annual  Wellness Visit scheduled for next year: Yes 11/05/24 @ 10:10  Managing Pain Without Opioids Opioids are strong medicines used to treat moderate to severe pain. For some people, especially those who have long-term (chronic) pain, opioids may not be the best choice for pain management due to: Side effects like nausea, constipation, and sleepiness. The risk of addiction (opioid use disorder). The longer you take opioids, the greater your risk of addiction. Pain that lasts for more than 3 months is called chronic pain. Managing chronic pain usually requires more than one approach and is often provided by a team of health care providers working together (multidisciplinary approach). Pain management may be done at a pain management center or pain clinic. How to manage pain without the use of opioids Use non-opioid medicines Non-opioid medicines for pain may include: Over-the-counter or prescription non-steroidal anti-inflammatory drugs (NSAIDs). These may be the first medicines used for pain. They work well for muscle and bone pain, and they reduce swelling. Acetaminophen. This over-the-counter medicine may work well for milder pain but not swelling. Antidepressants. These may be used to treat chronic pain. A certain type of antidepressant (tricyclics) is often used. These medicines are given in lower doses for pain than when used for depression. Anticonvulsants. These are usually used to treat seizures but may also reduce nerve (neuropathic) pain. Muscle relaxants. These relieve pain caused by sudden muscle tightening (spasms). You may also use a pain medicine that is applied to  the skin as a patch, cream, or gel (topical analgesic), such as a numbing medicine. These may cause fewer side effects than medicines taken by mouth. Do certain therapies as directed Some therapies can help with pain management. They include: Physical therapy. You will do exercises to gain strength and flexibility. A physical  therapist may teach you exercises to move and stretch parts of your body that are weak, stiff, or painful. You can learn these exercises at physical therapy visits and practice them at home. Physical therapy may also involve: Massage. Heat wraps or applying heat or cold to affected areas. Electrical signals that interrupt pain signals (transcutaneous electrical nerve stimulation, TENS). Weak lasers that reduce pain and swelling (low-level laser therapy). Signals from your body that help you learn to regulate pain (biofeedback). Occupational therapy. This helps you to learn ways to function at home and work with less pain. Recreational therapy. This involves trying new activities or hobbies, such as a physical activity or drawing. Mental health therapy, including: Cognitive behavioral therapy (CBT). This helps you learn coping skills for dealing with pain. Acceptance and commitment therapy (ACT) to change the way you think and react to pain. Relaxation therapies, including muscle relaxation exercises and mindfulness-based stress reduction. Pain management counseling. This may be individual, family, or group counseling.  Receive medical treatments Medical treatments for pain management include: Nerve block injections. These may include a pain blocker and anti-inflammatory medicines. You may have injections: Near the spine to relieve chronic back or neck pain. Into joints to relieve back or joint pain. Into nerve areas that supply a painful area to relieve body pain. Into muscles (trigger point injections) to relieve some painful muscle conditions. A medical device placed near your spine to help block pain signals and relieve nerve pain or chronic back pain (spinal cord stimulation device). Acupuncture. Follow these instructions at home Medicines Take over-the-counter and prescription medicines only as told by your health care provider. If you are taking pain medicine, ask your health care  providers about possible side effects to watch out for. Do not drive or use heavy machinery while taking prescription opioid pain medicine. Lifestyle  Do not use drugs or alcohol to reduce pain. If you drink alcohol, limit how much you have to: 0-1 drink a day for women who are not pregnant. 0-2 drinks a day for men. Know how much alcohol is in a drink. In the [place] one drink equals one 12 oz bottle of beer (355 mL), one 5 oz glass of wine (148 mL), or one 1 oz glass of hard liquor (44 mL). Do not use any products that contain nicotine or tobacco. These products include cigarettes, chewing tobacco, and vaping devices, such as e-cigarettes. If you need help quitting, ask your health care provider. Eat a healthy diet and maintain a healthy weight. Poor diet and excess weight may make pain worse. Eat foods that are high in fiber. These include fresh fruits and vegetables, whole grains, and beans. Limit foods that are high in fat and processed sugars, such as fried and sweet foods. Exercise regularly. Exercise lowers stress and may help relieve pain. Ask your health care provider what activities and exercises are safe for you. If your health care provider approves, join an exercise class that combines movement and stress reduction. Examples include yoga and tai chi. Get enough sleep. Lack of sleep may make pain worse. Lower stress as much as possible. Practice stress reduction techniques as told by your therapist. General instructions Work  with all your pain management providers to find the treatments that work best for you. You are an important member of your pain management team. There are many things you can do to reduce pain on your own. Consider joining an online or in-person support group for people who have chronic pain. Keep all follow-up visits. This is important. Where to find more information You can find more information about managing pain without opioids from: American Academy of  Pain Medicine: painmed.org Institute for Chronic Pain: instituteforchronicpain.org American Chronic Pain Association: theacpa.org Contact a health care provider if: You have side effects from pain medicine. Your pain gets worse or does not get better with treatments or home therapy. You are struggling with anxiety or depression. Summary Many types of pain can be managed without opioids. Chronic pain may respond better to pain management without opioids. Pain is best managed when you and a team of health care providers work together. Pain management without opioids may include non-opioid medicines, medical treatments, physical therapy, mental health therapy, and lifestyle changes. Tell your health care providers if your pain gets worse or is not being managed well enough. This information is not intended to replace advice given to you by your health care provider. Make sure you discuss any questions you have with your health care provider. Document Revised: 12/03/2020 Document Reviewed: 12/03/2020  Elsevier Patient Education  2024 [employer].

## 2023-11-02 NOTE — Progress Notes (Signed)
 Subjective:   Cheryl Wallace is a 73 y.o. who presents for a Medicare Wellness preventive visit.  Visit Complete: Virtual I connected with  Cheryl Wallace on 11/02/23 by a audio enabled telemedicine application and verified that I am speaking with the correct person using two identifiers.  Patient Location: Home  Provider Location: Office/Clinic  I discussed the limitations of evaluation and management by telemedicine. The patient expressed understanding and agreed to proceed.  Vital Signs: Because this visit was a virtual/telehealth visit, some criteria may be missing or patient reported. Any vitals not documented were not able to be obtained and vitals that have been documented are patient reported.  VideoDeclined- This patient declined Librarian, academic. Therefore the visit was completed with audio only.  AWV Questionnaire: Yes: Patient Medicare AWV questionnaire was completed by the patient on 11/01/23; I have confirmed that all information answered by patient is correct and no changes since this date.  Cardiac Risk Factors include: advanced age (>37men, >29 women);diabetes mellitus;dyslipidemia;hypertension;obesity (BMI >30kg/m2)     Objective:    Today's Vitals   11/02/23 1014  Weight: 220 lb (99.8 kg)  Height: 5\' 5"  (1.651 m)  PainSc: 8    Body mass index is 36.61 kg/m.     11/02/2023   10:30 AM 10/21/2023    2:29 PM 10/19/2023    1:00 PM 11/03/2022    6:24 AM 11/01/2022   10:29 AM 10/26/2022    6:19 PM 10/13/2022   10:38 AM  Advanced Directives  Does Patient Have a Medical Advance Directive? No No No No No No No  Would patient like information on creating a medical advance directive? No - Patient declined No - Patient declined No - Patient declined No - Patient declined No - Patient declined No - Patient declined No - Patient declined    Current Medications (verified) Outpatient Encounter Medications as of 11/02/2023  Medication Sig    albuterol (VENTOLIN HFA) 108 (90 Base) MCG/ACT inhaler Inhale 1-2 puffs into the lungs every 6 (six) hours as needed for wheezing or shortness of breath.   amLODipine (NORVASC) 10 MG tablet Take 1 tablet (10 mg total) by mouth daily.   atorvastatin (LIPITOR) 20 MG tablet TAKE 1 TABLET BY MOUTH EVERY DAY   carvedilol (COREG) 3.125 MG tablet TAKE 1 TABLET BY MOUTH TWICE A DAY WITH FOOD   cholecalciferol (VITAMIN D) 1000 UNITS tablet Take 1,000 Units by mouth daily.    diphenhydrAMINE (BENADRYL) 50 MG tablet Take 50 mg by mouth at bedtime as needed for allergies.   empagliflozin (JARDIANCE) 25 MG TABS tablet Take 1 tablet (25 mg total) by mouth daily.   EPINEPHrine 0.3 mg/0.3 mL IJ SOAJ injection Inject 0.3 mg into the muscle as needed for anaphylaxis. Due to shellfish allergy   fexofenadine (ALLEGRA) 180 MG tablet Take 180 mg by mouth every morning.   fluticasone (FLONASE) 50 MCG/ACT nasal spray Place 1 spray into both nostrils daily as needed for allergies.   gatifloxacin (ZYMAXID) 0.5 % SOLN Place 1 drop into the right eye 4 (four) times daily.   glipiZIDE (GLUCOTROL) 5 MG tablet Take 1 tablet (5 mg total) by mouth 2 (two) times daily.   ketorolac (ACULAR) 0.5 % ophthalmic solution Place 1 drop into the right eye 4 (four) times daily.   metFORMIN (GLUCOPHAGE) 1000 MG tablet TAKE 1 TABLET (1,000 MG TOTAL) BY MOUTH TWICE A DAY WITH FOOD   omeprazole (PRILOSEC) 40 MG capsule TAKE 1 CAPSULE BY  MOUTH EVERY DAY   prednisoLONE acetate (PRED FORTE) 1 % ophthalmic suspension Place 1 drop into the right eye 4 (four) times daily.   spironolactone (ALDACTONE) 25 MG tablet TAKE 1 TABLET (25 MG TOTAL) BY MOUTH DAILY.   tirzepatide [facility]) 2.5 MG/0.5ML Pen Inject 2.5 mg into the skin once a week.   traMADol (ULTRAM) 50 MG tablet Take 1 tablet (50 mg total) by mouth 3 (three) times daily as needed.   valsartan (DIOVAN) 80 MG tablet Take 1 tablet (80 mg total) by mouth daily.   zolpidem (AMBIEN) 5 MG tablet  TAKE 1 TABLET BY MOUTH AT BEDTIME AS NEEDED FOR SLEEP   [DISCONTINUED] promethazine-dextromethorphan (PROMETHAZINE-DM) 6.25-15 MG/5ML syrup Take 5 mLs by mouth 4 (four) times daily as needed. (Patient not taking: Reported on 11/02/2023)   No facility-administered encounter medications on file as of 11/02/2023.    Allergies (verified) Shellfish allergy, Lisinopril, Ozempic (0.25 or 0.5 mg-dose) [semaglutide(0.25 or 0.5mg -dos)], and Penicillins   History: Past Medical History:  Diagnosis Date   Acute meniscal tear, medial, right, sequela 09/22/2020   Allergic rhinitis    Anemia    iron deficiency   Arthritis    osteoarthritis right knee   Chronic kidney disease    nephropathy d/t diabetes   Complication of anesthesia 1981   While pt was under anesthesia, pt had an asthema attack during BTL due to having the flu   Depression, major, single episode, mild (HCC) 09/04/2016   Elbow tendonitis    a. bilat, s/p surgery.   Essential hypertension    Functional ovarian cysts 2011   a. s/p TAH (1994);  b. s/p oopherectomy (~2011)   Gallstones    GERD (gastroesophageal reflux disease)    History of kidney stones    Hyperlipidemia    Insomnia    Low grade fever    Obesity    Pneumonia    Polyarthralgia    Seasonal allergies    Seizure (HCC)    last episode at 73 years old. nothing since. unknown etiology   Tinnitus    Type 2 diabetes mellitus (HCC)    a.  Hemoglobin A1c in July 2016, 6.7.   Past Surgical History:  Procedure Laterality Date   ABDOMINAL HYSTERECTOMY  1994   CATARACT EXTRACTION Right 09/2023   CLOSED MANIPULATION SHOULDER WITH STERIOD INJECTION Right 01/06/2022   Procedure: CLOSED MANIPULATION SHOULDER WITH STEROID INJECTION;  Surgeon: Christena Flake, MD;  Location: [facility];  Service: Orthopedics;  Laterality: Right;   COLONOSCOPY  05/2007   Non-bleeding internal hemorhoids identified.   COLONOSCOPY WITH PROPOFOL N/A 02/25/2020   Procedure: COLONOSCOPY WITH PROPOFOL;   Surgeon: Toledo, Boykin Nearing, MD;  Location: [facility];  Service: Gastroenterology;  Laterality: N/A;   ELBOW SURGERY Bilateral    d/t tendonitis   ESOPHAGOGASTRODUODENOSCOPY (EGD) WITH PROPOFOL N/A 10/28/2021   Procedure: ESOPHAGOGASTRODUODENOSCOPY (EGD) WITH PROPOFOL;  Surgeon: Toledo, Boykin Nearing, MD;  Location: [facility];  Service: Gastroenterology;  Laterality: N/A;  DM   FRACTURE SURGERY Right    wrist/hand with plate   KNEE ARTHROSCOPY Right 11/18/2020   Procedure: Right knee arthroscopy, partial medial meniscectomy;  Surgeon: Kennedy Bucker, MD;  Location: [facility];  Service: Orthopedics;  Laterality: Right;   NASAL SINUS SURGERY  09/18/2015   Procedure: IMAGE GUIDED SINUS SURGERY, BILATERAL MAXILLARY BALLOON SINUPLASTY, BILATERAL FRONTAL BALLOON SINUPLASTY, RIGHT SPHENOID  SINUPLASTY, RIGHT CONCHABULLOSA RESECTION;  Surgeon: Bud Face, MD;  Location: [facility];  Service: ENT;;   Alma Friendly  04/2010  bilateral, 7 cm benign tumor left ovary   OPEN REDUCTION INTERNAL FIXATION (ORIF) DISTAL RADIAL FRACTURE Right 02/07/2017   Procedure: OPEN REDUCTION INTERNAL FIXATION (ORIF) DISTAL RADIAL FRACTURE;  Surgeon: Kennedy Bucker, MD;  Location: [facility];  Service: Orthopedics;  Laterality: Right;   ORIF TIBIA PLATEAU Right 11/03/2022   Procedure: OPEN REDUCTION INTERNAL FIXATION (ORIF) TIBIAL PLATEAU;  Surgeon: Roby Lofts, MD;  Location: [facility];  Service: Orthopedics;  Laterality: Right;   PARTIAL HYSTERECTOMY  1994   still has ovaries, removed for fibroids   SHOULDER ARTHROSCOPY WITH SUBACROMIAL DECOMPRESSION, ROTATOR CUFF REPAIR AND BICEP TENDON REPAIR Right 08/06/2021   Procedure: RIGHT SHOULDER ARTHROSCOPY WITH DEBRIDEMENT, DECOMPRESSION, AND POSSIBLE ROTATOR CUFF REPAIR;  Surgeon: Christena Flake, MD;  Location: [facility];  Service: Orthopedics;  Laterality: Right;   TONSILLECTOMY N/A 09/18/2015   Procedure: TONSILLECTOMY;  Surgeon: Bud Face, MD;  Location: [facility];   Service: ENT;  Laterality: N/A;   TONSILLECTOMY     TUBAL LIGATION     UPPER GI ENDOSCOPY  2011   Family History  Problem Relation Age of Onset   Cancer Mother        colon   Tuberculosis Father    Diabetes Father    Heart disease Father        MI x 2   Heart attack Father    Diabetes Sister        half sister   Social History   Socioeconomic History   Marital status: Divorced    Spouse name: Not on file   Number of children: Not on file   Years of education: Not on file   Highest education level: GED or equivalent  Occupational History   Occupation: school and bus driver    Comment: still working  Tobacco Use   Smoking status: Former    Current packs/day: 0.00    Average packs/day: 1.5 packs/day for 10.0 years (15.0 ttl pk-yrs)    Types: Cigarettes    Start date: 09/05/1974    Quit date: 09/05/1984    Years since quitting: 39.1   Smokeless tobacco: Never   Tobacco comments:    remote, quit 30 years ago  Vaping Use   Vaping status: Never Used  Substance and Sexual Activity   Alcohol use: Not Currently    Alcohol/week: 0.0 standard drinks of alcohol    Comment: on occasion, martinis maybe twice a year (Christmas & New Years).   Drug use: No   Sexual activity: Not Currently  Other Topics Concern   Not on file  Social History Narrative   Patient lives alone. Currently working. Daughter lives somewhat nearby.    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     Does not routinely exercise.  Always uses seat belts.  Has well water.   Social Drivers of Corporate investment banker Strain: Low Risk  (11/02/2023)   Overall Financial Resource Strain (CARDIA)    Difficulty of Paying Living Expenses: Not hard at all  Food Insecurity: No Food Insecurity (11/02/2023)   Hunger Vital Sign    Worried About Running Out of Food in the Last Year: Never true    Ran Out of Food in the Last Year: Never true  Transportation Needs: No Transportation Needs (11/02/2023)   PRAPARE -  Administrator, Civil Service (Medical): No    Lack of Transportation (Non-Medical): No  Recent Concern: Transportation Needs - Unmet Transportation Needs (09/15/2023)   PRAPARE -  Administrator, Civil Service (Medical): Patient declined    Lack of Transportation (Non-Medical): Yes  Physical Activity: Inactive (11/02/2023)   Exercise Vital Sign    Days of Exercise per Week: 0 days    Minutes of Exercise per Session: 0 min  Stress: No Stress Concern Present (11/02/2023)   [employer] of Occupational Health - Occupational Stress Questionnaire    Feeling of Stress : Not at all  Recent Concern: Stress - Stress Concern Present (09/15/2023)   [employer] of Occupational Health - Occupational Stress Questionnaire    Feeling of Stress : To some extent  Social Connections: Socially Isolated (11/02/2023)   Social Connection and Isolation Panel [NHANES]    Frequency of Communication with Friends and Family: Once a week    Frequency of Social Gatherings with Friends and Family: Never    Attends Religious Services: Never    Database administrator or Organizations: Yes    Attends Engineer, structural: More than 4 times per year    Marital Status: Divorced    Tobacco Counseling Counseling given: Not Answered Tobacco comments: remote, quit 30 years ago    Clinical Intake:  Pre-visit preparation completed: Yes  Pain : 0-10 Pain Score: 8  Pain Type: Chronic pain Pain Location: Leg Pain Orientation: Right Pain Descriptors / Indicators: Nagging Pain Onset: More than a month ago Pain Frequency: Constant     BMI - recorded: 36.61 Nutritional Status: BMI > 30  Obese Nutritional Risks: None Diabetes: Yes CBG done?: No Did pt. bring in CBG monitor from home?: No  How often do you need to have someone help you when you read instructions, pamphlets, or other written materials from your doctor or pharmacy?: 1 - Never  Interpreter Needed?:  No  Information entered by :: R. Phung Kotas LPN   Activities of Daily Living     11/01/2023    3:56 PM 11/03/2022    6:26 AM  In your present state of health, do you have any difficulty performing the following activities:  Hearing? 0 0  Vision? 0 0  Difficulty concentrating or making decisions? 0 0  Walking or climbing stairs? 1 1  Dressing or bathing? 0 0  Doing errands, shopping? 0   Preparing Food and eating ? N   Using the Toilet? N   In the past six months, have you accidently leaked urine? N   Do you have problems with loss of bowel control? N   Managing your Medications? N   Managing your Finances? N   Housekeeping or managing your Housekeeping? N     Patient Care Team: Sherlene Shams, MD as PCP - General (Internal Medicine) Lemar Livings Merrily Pew, MD (General Surgery) Loree Fee, [facility] (Pharmacist)  Indicate any recent Medical Services you may have received from other than [facility] providers in the past year (date may be approximate).     Assessment:   This is a routine wellness examination for Exie.  Hearing/Vision screen Hearing Screening - Comments:: No issues Vision Screening - Comments:: glasses   Goals Addressed             This Visit's Progress    Patient Stated       Wants to work on getting her leg straightened out so she can do more        Depression Screen     11/02/2023   10:21 AM 10/10/2023    8:41 AM 06/16/2023   11:37 AM 03/16/2023  9:53 AM 12/13/2022   10:57 AM 11/01/2022   10:27 AM 06/24/2022    8:45 AM  PHQ 2/9 Scores  PHQ - 2 Score 5 4 4 6 3  0 0  PHQ- 9 Score 11 13 17 15 13   0    Fall Risk     11/01/2023    3:56 PM 10/10/2023    8:41 AM 06/16/2023   11:37 AM 03/16/2023    9:52 AM 12/13/2022   10:57 AM  Fall Risk   Falls in the past year? 0 0 0 0 1  Number falls in past yr: 0 0 0 0 0  Injury with Fall? 0 0 0 0 0  Risk for fall due to : No Fall Risks No Fall Risks No Fall Risks No Fall Risks History of fall(s)  Follow up Falls  prevention discussed;Falls evaluation completed Falls evaluation completed Falls evaluation completed Falls evaluation completed Falls evaluation completed    MEDICARE RISK AT HOME:  Medicare Risk at Home Any stairs in or around the home?: (Patient-Rptd) Yes If so, are there any without handrails?: (Patient-Rptd) Yes Home free of loose throw rugs in walkways, pet beds, electrical cords, etc?: (Patient-Rptd) Yes Adequate lighting in your home to reduce risk of falls?: (Patient-Rptd) Yes Life alert?: (Patient-Rptd) No Use of a cane, walker or w/c?: (Patient-Rptd) No Grab bars in the bathroom?: (Patient-Rptd) No Shower chair or bench in shower?: (Patient-Rptd) Yes Elevated toilet seat or a handicapped toilet?: (Patient-Rptd) Yes  TIMED UP AND GO:  Was the test performed?  No  Cognitive Function: 6CIT completed    11/01/2022   10:48 AM  MMSE - Mini Mental State Exam  Not completed: Unable to complete        11/02/2023   10:30 AM  6CIT Screen  What Year? 0 points  What month? 0 points  What time? 0 points  Count back from 20 0 points  Months in reverse 0 points  Repeat phrase 0 points  Total Score 0 points    Immunizations Immunization History  Administered Date(s) Administered   Fluad Quad(high Dose 65+) 07/18/2019, 07/17/2020, 06/17/2022   Fluad Trivalent(High Dose 65+) 06/16/2023   Influenza Split 09/15/2011   Influenza, High Dose Seasonal PF 05/12/2016, 06/22/2021   Influenza,inj,Quad PF,6+ Mos 10/14/2015, 06/14/2017   Influenza-Unspecified 07/03/2018   PFIZER Comirnaty(Gray Top)Covid-19 Tri-Sucrose Vaccine 12/13/2020   PFIZER(Purple Top)SARS-COV-2 Vaccination 10/05/2019, 10/30/2019, 06/02/2020   Pfizer(Comirnaty)Fall Seasonal Vaccine 12 years and older 06/17/2022   Pneumococcal Conjugate-13 07/11/2018   Pneumococcal Polysaccharide-23 03/13/2015, 07/17/2020   Tdap 12/29/2012, 01/06/2018   Zoster Recombinant(Shingrix) 04/26/2023, 07/19/2023    Screening  Tests Health Maintenance  Topic Date Due   COVID-19 Vaccine (6 - 2024-25 season) 05/08/2023   MAMMOGRAM  07/06/2023   Medicare Annual Wellness (AWV)  11/02/2023   Diabetic kidney evaluation - Urine ACR  03/15/2024   OPHTHALMOLOGY EXAM  03/17/2024   HEMOGLOBIN A1C  04/08/2024   FOOT EXAM  06/15/2024   Diabetic kidney evaluation - eGFR measurement  10/09/2024   Colonoscopy  02/24/2025   DTaP/Tdap/Td (3 - Td or Tdap) 01/07/2028   Pneumonia Vaccine 20+ Years old  Completed   INFLUENZA VACCINE  Completed   DEXA SCAN  Completed   Hepatitis C Screening  Completed   Zoster Vaccines- Shingrix  Completed   HPV VACCINES  Aged Out    Health Maintenance  Health Maintenance Due  Topic Date Due   COVID-19 Vaccine (6 - 2024-25 season) 05/08/2023   MAMMOGRAM  07/06/2023  Medicare Annual Wellness (AWV)  11/02/2023   Health Maintenance Items Addressed: Mammogram ordered, DEXA ordered, See Nurse Notes orders were placed on 10/10/23 at office visit  Additional Screening:  Vision Screening: Recommended annual ophthalmology exams for early detection of glaucoma and other disorders of the eye. Up to date [facility]  Dental Screening: Recommended annual dental exams for proper oral hygiene  Community Resource Referral / Chronic Care Management: CRR required this visit?  No   CCM required this visit?  No     Plan:     I have personally reviewed and noted the following in the patient's chart:   Medical and social history Use of alcohol, tobacco or illicit drugs  Current medications and supplements including opioid prescriptions. Patient is currently taking opioid prescriptions. Information provided to patient regarding non-opioid alternatives. Patient advised to discuss non-opioid treatment plan with their provider. Functional ability and status Nutritional status Physical activity Advanced directives List of other physicians Hospitalizations, surgeries, and ER visits in previous 12  months Vitals Screenings to include cognitive, depression, and falls Referrals and appointments  In addition, I have reviewed and discussed with patient certain preventive protocols, quality metrics, and best practice recommendations. A written personalized care plan for preventive services as well as general preventive health recommendations were provided to patient.     Sydell Axon, LPN   1/61/0960   After Visit Summary: (MyChart) Due to this being a telephonic visit, the after visit summary with patients personalized plan was offered to patient via MyChart   Notes: Nothing significant to report at this time.

## 2023-11-03 ENCOUNTER — Inpatient Hospital Stay: Payer: Medicare PPO

## 2023-11-03 VITALS — BP 141/85 | HR 80 | Temp 97.4°F | Resp 18

## 2023-11-03 DIAGNOSIS — D509 Iron deficiency anemia, unspecified: Secondary | ICD-10-CM | POA: Diagnosis not present

## 2023-11-03 DIAGNOSIS — D508 Other iron deficiency anemias: Secondary | ICD-10-CM

## 2023-11-03 MED ORDER — IRON SUCROSE 20 MG/ML IV SOLN
200.0000 mg | INTRAVENOUS | Status: DC
  Administered 2023-11-03: 200 mg via INTRAVENOUS

## 2023-11-07 ENCOUNTER — Inpatient Hospital Stay: Payer: Medicare PPO | Attending: Oncology

## 2023-11-07 VITALS — BP 111/61 | HR 80 | Temp 98.5°F | Resp 16

## 2023-11-07 DIAGNOSIS — D509 Iron deficiency anemia, unspecified: Secondary | ICD-10-CM | POA: Diagnosis not present

## 2023-11-07 DIAGNOSIS — D508 Other iron deficiency anemias: Secondary | ICD-10-CM

## 2023-11-07 MED ORDER — IRON SUCROSE 20 MG/ML IV SOLN
200.0000 mg | INTRAVENOUS | Status: DC
  Administered 2023-11-07: 200 mg via INTRAVENOUS
  Filled 2023-11-07: qty 10

## 2023-11-07 NOTE — Progress Notes (Signed)
 Pt tolerated treatment without concerns.  VSS.  Pt refused 30 minute post observation.  Pt understands risks.

## 2023-11-07 NOTE — Patient Instructions (Signed)

## 2023-11-11 DIAGNOSIS — H25042 Posterior subcapsular polar age-related cataract, left eye: Secondary | ICD-10-CM | POA: Diagnosis not present

## 2023-11-11 DIAGNOSIS — H25012 Cortical age-related cataract, left eye: Secondary | ICD-10-CM | POA: Diagnosis not present

## 2023-11-11 DIAGNOSIS — H2512 Age-related nuclear cataract, left eye: Secondary | ICD-10-CM | POA: Diagnosis not present

## 2023-11-18 DIAGNOSIS — H2512 Age-related nuclear cataract, left eye: Secondary | ICD-10-CM | POA: Diagnosis not present

## 2023-12-16 ENCOUNTER — Ambulatory Visit: Payer: Medicare PPO | Admitting: Internal Medicine

## 2023-12-19 DIAGNOSIS — I10 Essential (primary) hypertension: Secondary | ICD-10-CM | POA: Diagnosis not present

## 2023-12-19 DIAGNOSIS — E1122 Type 2 diabetes mellitus with diabetic chronic kidney disease: Secondary | ICD-10-CM | POA: Diagnosis not present

## 2023-12-19 DIAGNOSIS — N1831 Chronic kidney disease, stage 3a: Secondary | ICD-10-CM | POA: Diagnosis not present

## 2023-12-27 ENCOUNTER — Other Ambulatory Visit: Payer: Self-pay | Admitting: Internal Medicine

## 2024-01-05 ENCOUNTER — Encounter: Payer: Self-pay | Admitting: Oncology

## 2024-01-09 ENCOUNTER — Ambulatory Visit: Payer: Medicare PPO | Admitting: Internal Medicine

## 2024-01-11 ENCOUNTER — Other Ambulatory Visit: Payer: Medicare PPO

## 2024-01-11 ENCOUNTER — Inpatient Hospital Stay: Payer: Medicare PPO | Attending: Oncology

## 2024-01-11 DIAGNOSIS — D509 Iron deficiency anemia, unspecified: Secondary | ICD-10-CM

## 2024-01-11 LAB — IRON AND TIBC
Iron: 51 ug/dL (ref 28–170)
Saturation Ratios: 15 % (ref 10.4–31.8)
TIBC: 335 ug/dL (ref 250–450)
UIBC: 284 ug/dL

## 2024-01-11 LAB — CBC WITH DIFFERENTIAL (CANCER CENTER ONLY)
Abs Immature Granulocytes: 0.04 10*3/uL (ref 0.00–0.07)
Basophils Absolute: 0 10*3/uL (ref 0.0–0.1)
Basophils Relative: 1 %
Eosinophils Absolute: 0.4 10*3/uL (ref 0.0–0.5)
Eosinophils Relative: 5 %
HCT: 40 % (ref 36.0–46.0)
Hemoglobin: 12.7 g/dL (ref 12.0–15.0)
Immature Granulocytes: 1 %
Lymphocytes Relative: 34 %
Lymphs Abs: 2.6 10*3/uL (ref 0.7–4.0)
MCH: 29 pg (ref 26.0–34.0)
MCHC: 31.8 g/dL (ref 30.0–36.0)
MCV: 91.3 fL (ref 80.0–100.0)
Monocytes Absolute: 0.6 10*3/uL (ref 0.1–1.0)
Monocytes Relative: 8 %
Neutro Abs: 4.1 10*3/uL (ref 1.7–7.7)
Neutrophils Relative %: 51 %
Platelet Count: 277 10*3/uL (ref 150–400)
RBC: 4.38 MIL/uL (ref 3.87–5.11)
RDW: 15.9 % — ABNORMAL HIGH (ref 11.5–15.5)
WBC Count: 7.8 10*3/uL (ref 4.0–10.5)
nRBC: 0 % (ref 0.0–0.2)

## 2024-01-11 LAB — FERRITIN: Ferritin: 82 ng/mL (ref 11–307)

## 2024-01-18 ENCOUNTER — Ambulatory Visit: Admitting: Internal Medicine

## 2024-01-18 ENCOUNTER — Encounter: Payer: Self-pay | Admitting: Internal Medicine

## 2024-01-18 VITALS — BP 128/78 | HR 77 | Temp 97.7°F | Ht 65.0 in | Wt 217.0 lb

## 2024-01-18 DIAGNOSIS — E1121 Type 2 diabetes mellitus with diabetic nephropathy: Secondary | ICD-10-CM

## 2024-01-18 DIAGNOSIS — G8929 Other chronic pain: Secondary | ICD-10-CM

## 2024-01-18 DIAGNOSIS — I10 Essential (primary) hypertension: Secondary | ICD-10-CM | POA: Diagnosis not present

## 2024-01-18 DIAGNOSIS — D62 Acute posthemorrhagic anemia: Secondary | ICD-10-CM

## 2024-01-18 DIAGNOSIS — E1169 Type 2 diabetes mellitus with other specified complication: Secondary | ICD-10-CM

## 2024-01-18 DIAGNOSIS — Z7985 Long-term (current) use of injectable non-insulin antidiabetic drugs: Secondary | ICD-10-CM

## 2024-01-18 DIAGNOSIS — Z1231 Encounter for screening mammogram for malignant neoplasm of breast: Secondary | ICD-10-CM | POA: Diagnosis not present

## 2024-01-18 DIAGNOSIS — E66812 Obesity, class 2: Secondary | ICD-10-CM

## 2024-01-18 DIAGNOSIS — E785 Hyperlipidemia, unspecified: Secondary | ICD-10-CM | POA: Diagnosis not present

## 2024-01-18 DIAGNOSIS — M25561 Pain in right knee: Secondary | ICD-10-CM | POA: Diagnosis not present

## 2024-01-18 DIAGNOSIS — Z7984 Long term (current) use of oral hypoglycemic drugs: Secondary | ICD-10-CM

## 2024-01-18 LAB — COMPREHENSIVE METABOLIC PANEL WITH GFR
ALT: 19 U/L (ref 0–35)
AST: 17 U/L (ref 0–37)
Albumin: 4.1 g/dL (ref 3.5–5.2)
Alkaline Phosphatase: 66 U/L (ref 39–117)
BUN: 15 mg/dL (ref 6–23)
CO2: 27 meq/L (ref 19–32)
Calcium: 9.1 mg/dL (ref 8.4–10.5)
Chloride: 106 meq/L (ref 96–112)
Creatinine, Ser: 0.96 mg/dL (ref 0.40–1.20)
GFR: 59.65 mL/min — ABNORMAL LOW (ref >60.00)
Glucose, Bld: 59 mg/dL — ABNORMAL LOW (ref 70–99)
Potassium: 4.1 meq/L (ref 3.5–5.1)
Sodium: 142 meq/L (ref 135–145)
Total Bilirubin: 0.5 mg/dL (ref 0.2–1.2)
Total Protein: 6.7 g/dL (ref 6.0–8.3)

## 2024-01-18 LAB — MICROALBUMIN / CREATININE URINE RATIO
Creatinine,U: 232.3 mg/dL
Microalb Creat Ratio: 18.7 mg/g (ref 0.0–30.0)
Microalb, Ur: 4.3 mg/dL — ABNORMAL HIGH (ref 0.0–1.9)

## 2024-01-18 LAB — LIPID PANEL
Cholesterol: 101 mg/dL (ref 0–200)
HDL: 38.4 mg/dL — ABNORMAL LOW (ref >39.00)
LDL Cholesterol: 30 mg/dL (ref 0–99)
NonHDL: 62.49
Total CHOL/HDL Ratio: 3
Triglycerides: 163 mg/dL — ABNORMAL HIGH (ref 0.0–149.0)
VLDL: 32.6 mg/dL (ref 0.0–40.0)

## 2024-01-18 LAB — HEMOGLOBIN A1C: Hgb A1c MFr Bld: 6.5 % (ref 4.6–6.5)

## 2024-01-18 LAB — LDL CHOLESTEROL, DIRECT: Direct LDL: 39 mg/dL

## 2024-01-18 MED ORDER — DULOXETINE HCL 20 MG PO CPEP: 20.0000 mg | ORAL_CAPSULE | Freq: Every day | ORAL | 3 refills | Status: DC

## 2024-01-18 MED ORDER — ZOLPIDEM TARTRATE 5 MG PO TABS: 5.0000 mg | ORAL_TABLET | Freq: Every evening | ORAL | 5 refills | Status: DC | PRN

## 2024-01-18 MED ORDER — CARVEDILOL 3.125 MG PO TABS: 3.1250 mg | ORAL_TABLET | Freq: Two times a day (BID) | ORAL | 1 refills | Status: DC

## 2024-01-18 MED ORDER — ATORVASTATIN CALCIUM 20 MG PO TABS: 20.0000 mg | ORAL_TABLET | Freq: Every day | ORAL | 3 refills | Status: AC

## 2024-01-18 MED ORDER — VALSARTAN 80 MG PO TABS: 80.0000 mg | ORAL_TABLET | Freq: Every day | ORAL | 3 refills | Status: DC

## 2024-01-18 MED ORDER — OMEPRAZOLE 40 MG PO CPDR: DELAYED_RELEASE_CAPSULE | ORAL | 3 refills | Status: AC

## 2024-01-18 NOTE — Patient Instructions (Signed)
 Adding cymbalta  to manage your chronic pain and help your mood  Referring you back to dr Haddix to discuss the long term plan for your knee

## 2024-01-18 NOTE — Assessment & Plan Note (Signed)
 Resolved.  Recent CBC and iron  stores are normal
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
## 2024-01-18 NOTE — Assessment & Plan Note (Signed)
 Her diabetes is controlled byt Complicated by obesity and HTN.   Glycemic control is excellent current medications but BMI is > 35.     She will continue jARDIANCE , Metformin   and glizpide,  and did not tolerate Ozempic   She was given a sample of  Mounjaro  at last visit in October 2024   but has not refilled.     Her copay per pharmacy is $40.   Continue statin therapy for CAD risk reduction and on ACE/ARB for renal protection and hypertension.     Lab Results  Component Value Date   HGBA1C 6.5 01/18/2024   Lab Results  Component Value Date   MICROALBUR 4.3 (H) 01/18/2024   MICROALBUR 1.0 03/16/2023
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
## 2024-01-19 NOTE — Assessment & Plan Note (Addendum)
 Improved with use of  mounjaro  offered and accepted. Samples of 2.5 mg

## 2024-01-21 ENCOUNTER — Ambulatory Visit: Payer: Self-pay | Admitting: Internal Medicine

## 2024-02-09 ENCOUNTER — Other Ambulatory Visit: Payer: Self-pay | Admitting: Internal Medicine

## 2024-03-07 ENCOUNTER — Other Ambulatory Visit: Payer: Self-pay | Admitting: Internal Medicine

## 2024-03-21 ENCOUNTER — Other Ambulatory Visit: Payer: Self-pay | Admitting: Internal Medicine

## 2024-03-23 ENCOUNTER — Other Ambulatory Visit: Payer: Self-pay | Admitting: Internal Medicine

## 2024-04-13 ENCOUNTER — Inpatient Hospital Stay (HOSPITAL_BASED_OUTPATIENT_CLINIC_OR_DEPARTMENT_OTHER): Payer: Medicare PPO | Admitting: Nurse Practitioner

## 2024-04-13 ENCOUNTER — Inpatient Hospital Stay: Payer: Medicare PPO | Attending: Oncology

## 2024-04-13 VITALS — BP 140/106 | HR 75 | Resp 18 | Ht 65.0 in | Wt 206.0 lb

## 2024-04-13 DIAGNOSIS — Z87891 Personal history of nicotine dependence: Secondary | ICD-10-CM | POA: Diagnosis not present

## 2024-04-13 DIAGNOSIS — D509 Iron deficiency anemia, unspecified: Secondary | ICD-10-CM

## 2024-04-13 LAB — CBC WITH DIFFERENTIAL (CANCER CENTER ONLY)
Abs Immature Granulocytes: 0.01 K/uL (ref 0.00–0.07)
Basophils Absolute: 0.1 K/uL (ref 0.0–0.1)
Basophils Relative: 1 %
Eosinophils Absolute: 0.5 K/uL (ref 0.0–0.5)
Eosinophils Relative: 8 %
HCT: 41.5 % (ref 36.0–46.0)
Hemoglobin: 13.4 g/dL (ref 12.0–15.0)
Immature Granulocytes: 0 %
Lymphocytes Relative: 41 %
Lymphs Abs: 2.7 K/uL (ref 0.7–4.0)
MCH: 30 pg (ref 26.0–34.0)
MCHC: 32.3 g/dL (ref 30.0–36.0)
MCV: 92.8 fL (ref 80.0–100.0)
Monocytes Absolute: 0.5 K/uL (ref 0.1–1.0)
Monocytes Relative: 7 %
Neutro Abs: 2.8 K/uL (ref 1.7–7.7)
Neutrophils Relative %: 43 %
Platelet Count: 259 K/uL (ref 150–400)
RBC: 4.47 MIL/uL (ref 3.87–5.11)
RDW: 13.7 % (ref 11.5–15.5)
WBC Count: 6.5 K/uL (ref 4.0–10.5)
nRBC: 0 % (ref 0.0–0.2)

## 2024-04-13 LAB — IRON AND TIBC
Iron: 59 ug/dL (ref 28–170)
Saturation Ratios: 19 % (ref 10.4–31.8)
TIBC: 316 ug/dL (ref 250–450)
UIBC: 257 ug/dL

## 2024-04-13 LAB — FERRITIN: Ferritin: 107 ng/mL (ref 11–307)
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
## 2024-04-18 ENCOUNTER — Other Ambulatory Visit: Payer: Self-pay | Admitting: Internal Medicine

## 2024-04-19 ENCOUNTER — Ambulatory Visit: Admitting: Internal Medicine

## 2024-04-19 ENCOUNTER — Encounter: Payer: Self-pay | Admitting: Internal Medicine

## 2024-04-19 VITALS — BP 110/66 | HR 99 | Ht 65.0 in | Wt 206.6 lb

## 2024-04-19 DIAGNOSIS — N1831 Chronic kidney disease, stage 3a: Secondary | ICD-10-CM | POA: Diagnosis not present

## 2024-04-19 DIAGNOSIS — M2550 Pain in unspecified joint: Secondary | ICD-10-CM | POA: Diagnosis not present

## 2024-04-19 DIAGNOSIS — R5383 Other fatigue: Secondary | ICD-10-CM | POA: Diagnosis not present

## 2024-04-19 DIAGNOSIS — Z7984 Long term (current) use of oral hypoglycemic drugs: Secondary | ICD-10-CM

## 2024-04-19 DIAGNOSIS — E66812 Obesity, class 2: Secondary | ICD-10-CM

## 2024-04-19 DIAGNOSIS — E1169 Type 2 diabetes mellitus with other specified complication: Secondary | ICD-10-CM

## 2024-04-19 DIAGNOSIS — D819 Combined immunodeficiency, unspecified: Secondary | ICD-10-CM | POA: Insufficient documentation

## 2024-04-19 DIAGNOSIS — Z6838 Body mass index (BMI) 38.0-38.9, adult: Secondary | ICD-10-CM

## 2024-04-19 DIAGNOSIS — E785 Hyperlipidemia, unspecified: Secondary | ICD-10-CM

## 2024-04-19 DIAGNOSIS — E1121 Type 2 diabetes mellitus with diabetic nephropathy: Secondary | ICD-10-CM | POA: Diagnosis not present

## 2024-04-19 DIAGNOSIS — I10 Essential (primary) hypertension: Secondary | ICD-10-CM | POA: Diagnosis not present

## 2024-04-19 DIAGNOSIS — D509 Iron deficiency anemia, unspecified: Secondary | ICD-10-CM

## 2024-04-19 DIAGNOSIS — F322 Major depressive disorder, single episode, severe without psychotic features: Secondary | ICD-10-CM | POA: Diagnosis not present

## 2024-04-19 DIAGNOSIS — Z1231 Encounter for screening mammogram for malignant neoplasm of breast: Secondary | ICD-10-CM

## 2024-04-19 MED ORDER — TRAMADOL HCL 50 MG PO TABS: 50.0000 mg | ORAL_TABLET | Freq: Four times a day (QID) | ORAL | 5 refills | Status: DC | PRN

## 2024-04-19 NOTE — Progress Notes (Signed)
 Subjective:  Patient ID: Cheryl Wallace, female    DOB: [birth year]  Age: 73 y.o. MRN: [id number]  CC: The primary encounter diagnosis was Encounter for screening mammogram for malignant neoplasm of breast. Diagnoses of Type 2 diabetes mellitus with nephropathy (HCC), Essential hypertension, Other fatigue, Hyperlipidemia associated with type 2 diabetes mellitus (HCC), Combined immunity deficiency (HCC), Moderately severe major depression (HCC), Class 2 severe obesity due to excess calories with serious comorbidity and body mass index (BMI) of 38.0 to 38.9 in adult [facility]), Iron  deficiency anemia, unspecified iron  deficiency anemia type, Stage 3a chronic kidney disease (HCC), and Polyarthralgia were also pertinent to this visit.   HPI [name] presents for  Chief Complaint  Patient presents with   Medical Management of Chronic Issues   1) Type 2 DM/obesity/HTN:  did not tolerate ozempic  .  Did not tolerate mounjaro   due to persistent n/v  without constipation.  Checking BS  infrequently,  denies  hypoglycemic symptoms,  3 am sugar was 105  had cataract suery (2) jan and March [facility] .  [facility] did the diabetic eye exam.  Down 11 lbs since May   2) chronic PAIN :  AGGRAVATED BY  RAIN . Using tramadol   NEEDS A 4 TH TRAMADOL  DAILY . RIGHT KNEE PAIN , USES a  cane.  Elevated leg uses ice packs.  Was able to visit sister Meade  in [place]   in mid July who recently had open heart surgery .  SABRA Developed illness on the last day , likely COVID based on URI symptoms  lasted 3 -4 days , no complications  3) HTN:  Hypertension: patient checks blood pressure twice monthly  at home.  Readings have been for the most part <130/80 at rest . Patient is following a reduced salt diet most days and is taking medications as prescribed   4) IDA:  August iron  and CBC were normal  no infusion needed  follows up in February    Outpatient Medications Prior to Visit  Medication Sig Dispense Refill    amLODipine  (NORVASC ) 10 MG tablet TAKE 1 TABLET BY MOUTH EVERY DAY 90 tablet 1   atorvastatin  (LIPITOR) 20 MG tablet Take 1 tablet (20 mg total) by mouth daily. 90 tablet 3   carvedilol  (COREG ) 3.125 MG tablet Take 1 tablet (3.125 mg total) by mouth 2 (two) times daily with a meal. 180 tablet 1   cholecalciferol  (VITAMIN D ) 1000 UNITS tablet Take 1,000 Units by mouth daily.      diphenhydrAMINE  (BENADRYL ) 50 MG tablet Take 50 mg by mouth at bedtime as needed for allergies.     DULoxetine  (CYMBALTA ) 20 MG capsule TAKE 1 CAPSULE BY MOUTH EVERY DAY 90 capsule 2   empagliflozin  (JARDIANCE ) 25 MG TABS tablet Take 1 tablet (25 mg total) by mouth daily. 90 tablet 3   EPINEPHrine  0.3 mg/0.3 mL IJ SOAJ injection Inject 0.3 mg into the muscle as needed for anaphylaxis. Due to shellfish allergy 1 each 1   fexofenadine (ALLEGRA) 180 MG tablet Take 180 mg by mouth every morning.     fluticasone  (FLONASE ) 50 MCG/ACT nasal spray Place 1 spray into both nostrils daily as needed for allergies.     gatifloxacin (ZYMAXID) 0.5 % SOLN Place 1 drop into the right eye 4 (four) times daily.     glipiZIDE  (GLUCOTROL ) 5 MG tablet TAKE 1 TABLET BY MOUTH TWICE A DAY 180 tablet 1   metFORMIN  (GLUCOPHAGE ) 1000 MG tablet TAKE 1  TABLET (1,000 MG TOTAL) BY MOUTH TWICE A DAY WITH FOOD 180 tablet 1   omeprazole (PRILOSEC) 40 MG capsule TAKE 1 CAPSULE BY MOUTH EVERY DAY 90 capsule 3   spironolactone (ALDACTONE) 25 MG tablet TAKE 1 TABLET (25 MG TOTAL) BY MOUTH DAILY. 90 tablet 3   valsartan (DIOVAN) 80 MG tablet Take 1 tablet (80 mg total) by mouth daily. 90 tablet 3   zolpidem (AMBIEN) 5 MG tablet Take 1 tablet (5 mg total) by mouth at bedtime as needed. for sleep 30 tablet 5   traMADol (ULTRAM) 50 MG tablet TAKE 1 TABLET BY MOUTH 3 TIMES DAILY AS NEEDED. 90 tablet 5   albuterol (VENTOLIN HFA) 108 (90 Base) MCG/ACT inhaler Inhale 1-2 puffs into the lungs every 6 (six) hours as needed for wheezing or shortness of breath. 1 g 0    ketorolac (ACULAR) 0.5 % ophthalmic solution Place 1 drop into the right eye 4 (four) times daily.     prednisoLONE acetate (PRED FORTE) 1 % ophthalmic suspension Place 1 drop into the right eye 4 (four) times daily.     tirzepatide (MOUNJARO) 2.5 MG/0.5ML Pen Inject 2.5 mg into the skin once a week. 2 mL 2   No facility-administered medications prior to visit.    Review of Systems;  Patient denies headache, fevers, malaise, unintentional weight loss, skin rash, eye pain, sinus congestion and sinus pain, sore throat, dysphagia,  hemoptysis , cough, dyspnea, wheezing, chest pain, palpitations, orthopnea, edema, abdominal pain, nausea, melena, diarrhea, constipation, flank pain, dysuria, hematuria, urinary  Frequency, nocturia, numbness, tingling, seizures,  Focal weakness, Loss of consciousness,  Tremor, insomnia, depression, anxiety, and suicidal ideation.      Objective:  BP 110/66   Pulse 99   Ht 5' 5 (1.651 m)   Wt 206 lb 9.6 oz (93.7 kg)   SpO2 95%   BMI 34.38 kg/m   BP Readings from Last 3 Encounters:  04/19/24 110/66  04/13/24 (!) 140/106  01/18/24 128/78    Wt Readings from Last 3 Encounters:  04/19/24 206 lb 9.6 oz (93.7 kg)  04/13/24 206 lb (93.4 kg)  01/18/24 217 lb (98.4 kg)    Physical Exam Vitals reviewed.  Constitutional:      General: She is not in acute distress.    Appearance: Normal appearance. She is normal weight. She is not ill-appearing, toxic-appearing or diaphoretic.  HENT:     Head: Normocephalic.  Eyes:     General: No scleral icterus.       Right eye: No discharge.        Left eye: No discharge.     Conjunctiva/sclera: Conjunctivae normal.  Cardiovascular:     Rate and Rhythm: Normal rate and regular rhythm.     Heart sounds: Normal heart sounds.  Pulmonary:     Effort: Pulmonary effort is normal. No respiratory distress.     Breath sounds: Normal breath sounds.  Musculoskeletal:        General: Normal range of motion.  Skin:     General: Skin is warm and dry.  Neurological:     General: No focal deficit present.     Mental Status: She is alert and oriented to person, place, and time. Mental status is at baseline.  Psychiatric:        Mood and Affect: Mood normal.        Behavior: Behavior normal.        Thought Content: Thought content normal.  Judgment: Judgment normal.     Lab Results  Component Value Date   HGBA1C 6.5 01/18/2024   HGBA1C 6.8 (H) 10/10/2023   HGBA1C 6.6 (H) 06/16/2023    Lab Results  Component Value Date   CREATININE 0.96 01/18/2024   CREATININE 0.99 10/10/2023   CREATININE 0.85 06/16/2023    Lab Results  Component Value Date   WBC 6.5 04/13/2024   HGB 13.4 04/13/2024   HCT 41.5 04/13/2024   PLT 259 04/13/2024   GLUCOSE 59 (L) 01/18/2024   CHOL 101 01/18/2024   TRIG 163.0 (H) 01/18/2024   HDL 38.40 (L) 01/18/2024   LDLDIRECT 39.0 01/18/2024   LDLCALC 30 01/18/2024   ALT 19 01/18/2024   AST 17 01/18/2024   NA 142 01/18/2024   K 4.1 01/18/2024   CL 106 01/18/2024   CREATININE 0.96 01/18/2024   BUN 15 01/18/2024   CO2 27 01/18/2024   TSH 1.272 06/24/2021   HGBA1C 6.5 01/18/2024   MICROALBUR 4.3 (H) 01/18/2024    DG Chest 2 View Result Date: 08/08/2023 CLINICAL DATA:  Cough and wheezing EXAM: CHEST - 2 VIEW COMPARISON:  08/06/2021, CT 10/26/2022, 03/06/2020 FINDINGS: The heart size and mediastinal contours are within normal limits. Both lungs are clear. The visualized skeletal structures are unremarkable. Left hilar prominence appears to correspond to vasculature on CT imaging IMPRESSION: No active cardiopulmonary disease. Electronically Signed   By: Luke Bun M.D.   On: 08/08/2023 21:08    Assessment & Plan:  .Encounter for screening mammogram for malignant neoplasm of breast -     3D Screening Mammogram, Left and Right; Future  Type 2 diabetes mellitus with nephropathy (HCC) Assessment & Plan: Her diabetes is Complicated by obesity and HTN.   Glycemic  control is excellent current medications but BMI is > 35.     She will continue jARDIANCE , Metformin   and glipizide  ,  and hs not tolerated  Ozempic  or mounjaro     Continue statin therapy for CAD risk reduction and on ACE/ARB for renal protection and hypertension.     Lab Results  Component Value Date   HGBA1C 6.5 01/18/2024   Lab Results  Component Value Date   MICROALBUR 4.3 (H) 01/18/2024    Orders: -     Hemoglobin A1c; Future -     Comprehensive metabolic panel with GFR; Future  Essential hypertension -     Comprehensive metabolic panel with GFR; Future  Other fatigue -     TSH; Future  Hyperlipidemia associated with type 2 diabetes mellitus (HCC) -     Lipid panel; Future -     LDL cholesterol, direct; Future  Combined immunity deficiency (HCC)  Moderately severe major depression (HCC) Assessment & Plan: Aggravated by chronic pain. symptoms have improved with pain management    Continue cymbalta     Class 2 severe obesity due to excess calories with serious comorbidity and body mass index (BMI) of 38.0 to 38.9 in adult [facility]) Assessment & Plan: She did not tolerate ozempic  OR MOUNHARO due to persistent nause AND VOMITING .  She is losing weight without pharmacotherapy    Iron  deficiency anemia, unspecified iron  deficiency anemia type Assessment & Plan: Improved with iv iron  infusion. Repeat levels are nornal  Lab Results  Component Value Date   IRON  59 04/13/2024   TIBC 316 04/13/2024   FERRITIN 107 04/13/2024     Lab Results  Component Value Date   WBC 6.5 04/13/2024   HGB 13.4 04/13/2024   HCT  41.5 04/13/2024   MCV 92.8 04/13/2024   PLT 259 04/13/2024     Orders: -     IBC + Ferritin; Future -     CBC with Differential/Platelet; Future  Stage 3a chronic kidney disease (HCC) Assessment & Plan: GFR is stable and has ranged from 58 ml/min to 69 ml/min. .  She is avoiding NSAIDS and taking an ARB   Lab Results  Component Value Date   CREATININE  0.96 01/18/2024   Lab Results  Component Value Date   MICROALBUR 4.3 (H) 01/18/2024        Polyarthralgia Assessment & Plan:  She again has no signs of inflammatory arthritis on exam today. She has DJD of several joints. Her pain is managed with tramadol , but needs a 4th tablet daily     Other orders -     traMADol  HCl; Take 1 tablet (50 mg total) by mouth every 6 (six) hours as needed.  Dispense: 120 tablet; Refill: 5    I personally spent a total of 34 minutes in the care of the patient today including preparing to see the patient, getting/reviewing separately obtained history, performing a medically appropriate exam/evaluation, counseling and educating, placing orders, independently interpreting results, and communicating results.  Follow-up: Return in about 3 months (around 07/20/2024).   Verneita LITTIE Kettering, MD

## 2024-04-19 NOTE — Assessment & Plan Note (Addendum)
 She did not tolerate ozempic  OR MOUNHARO due to persistent nause AND VOMITING .  She is losing weight without pharmacotherapy

## 2024-04-19 NOTE — Assessment & Plan Note (Signed)
 Her diabetes is Complicated by obesity and HTN.   Glycemic control is excellent current medications but BMI is > 35.     She will continue jARDIANCE , Metformin   and glipizide  ,  and hs not tolerated  Ozempic  or mounjaro     Continue statin therapy for CAD risk reduction and on ACE/ARB for renal protection and hypertension.     Lab Results  Component Value Date   HGBA1C 6.5 01/18/2024   Lab Results  Component Value Date   MICROALBUR 4.3 (H) 01/18/2024

## 2024-04-19 NOTE — Assessment & Plan Note (Signed)
 Improved with iv iron  infusion. Repeat levels are nornal  Lab Results  Component Value Date   IRON  59 04/13/2024   TIBC 316 04/13/2024   FERRITIN 107 04/13/2024     Lab Results  Component Value Date   WBC 6.5 04/13/2024   HGB 13.4 04/13/2024   HCT 41.5 04/13/2024   MCV 92.8 04/13/2024   PLT 259 04/13/2024

## 2024-04-19 NOTE — Assessment & Plan Note (Signed)
 GFR is stable and has ranged from 58 ml/min to 69 ml/min. .  She is avoiding NSAIDS and taking an ARB   Lab Results  Component Value Date   CREATININE 0.96 01/18/2024   Lab Results  Component Value Date   MICROALBUR 4.3 (H) 01/18/2024

## 2024-04-19 NOTE — Assessment & Plan Note (Addendum)
 Aggravated by chronic pain. symptoms have improved with pain management    Continue cymbalta 

## 2024-04-19 NOTE — Assessment & Plan Note (Signed)
 She again has no signs of inflammatory arthritis on exam today. She has DJD of several joints. Her pain is managed with tramadol , but needs a 4th tablet daily

## 2024-04-19 NOTE — Patient Instructions (Signed)
 I have increased your quantity of tramadol  to allow up to 4 tablets daily  Please have your labs done prior to Your November visit

## 2024-04-20 DIAGNOSIS — E119 Type 2 diabetes mellitus without complications: Secondary | ICD-10-CM | POA: Diagnosis not present

## 2024-04-20 DIAGNOSIS — H353131 Nonexudative age-related macular degeneration, bilateral, early dry stage: Secondary | ICD-10-CM | POA: Diagnosis not present

## 2024-04-20 DIAGNOSIS — H26493 Other secondary cataract, bilateral: Secondary | ICD-10-CM | POA: Diagnosis not present

## 2024-04-20 DIAGNOSIS — H40033 Anatomical narrow angle, bilateral: Secondary | ICD-10-CM | POA: Diagnosis not present

## 2024-04-20 LAB — HM DIABETES EYE EXAM

## 2024-04-23 DIAGNOSIS — D631 Anemia in chronic kidney disease: Secondary | ICD-10-CM | POA: Diagnosis not present

## 2024-04-23 DIAGNOSIS — E1122 Type 2 diabetes mellitus with diabetic chronic kidney disease: Secondary | ICD-10-CM | POA: Diagnosis not present

## 2024-04-23 DIAGNOSIS — R809 Proteinuria, unspecified: Secondary | ICD-10-CM | POA: Diagnosis not present

## 2024-04-23 DIAGNOSIS — N182 Chronic kidney disease, stage 2 (mild): Secondary | ICD-10-CM | POA: Diagnosis not present

## 2024-04-23 DIAGNOSIS — I10 Essential (primary) hypertension: Secondary | ICD-10-CM | POA: Diagnosis not present

## 2024-04-23 DIAGNOSIS — N1831 Chronic kidney disease, stage 3a: Secondary | ICD-10-CM | POA: Diagnosis not present

## 2024-07-05 ENCOUNTER — Other Ambulatory Visit (INDEPENDENT_AMBULATORY_CARE_PROVIDER_SITE_OTHER)

## 2024-07-05 DIAGNOSIS — E1121 Type 2 diabetes mellitus with diabetic nephropathy: Secondary | ICD-10-CM | POA: Diagnosis not present

## 2024-07-05 DIAGNOSIS — E1169 Type 2 diabetes mellitus with other specified complication: Secondary | ICD-10-CM

## 2024-07-05 DIAGNOSIS — I10 Essential (primary) hypertension: Secondary | ICD-10-CM

## 2024-07-05 DIAGNOSIS — R5383 Other fatigue: Secondary | ICD-10-CM | POA: Diagnosis not present

## 2024-07-05 DIAGNOSIS — D509 Iron deficiency anemia, unspecified: Secondary | ICD-10-CM

## 2024-07-05 DIAGNOSIS — E785 Hyperlipidemia, unspecified: Secondary | ICD-10-CM

## 2024-07-05 LAB — COMPREHENSIVE METABOLIC PANEL WITH GFR
ALT: 14 U/L (ref 0–35)
AST: 13 U/L (ref 0–37)
Albumin: 3.7 g/dL (ref 3.5–5.2)
Alkaline Phosphatase: 83 U/L (ref 39–117)
BUN: 12 mg/dL (ref 6–23)
CO2: 26 meq/L (ref 19–32)
Calcium: 8.6 mg/dL (ref 8.4–10.5)
Chloride: 105 meq/L (ref 96–112)
Creatinine, Ser: 0.73 mg/dL (ref 0.40–1.20)
GFR: 82.59 mL/min (ref >60.00)
Glucose, Bld: 93 mg/dL (ref 70–99)
Potassium: 3.9 meq/L (ref 3.5–5.1)
Sodium: 141 meq/L (ref 135–145)
Total Bilirubin: 0.6 mg/dL (ref 0.2–1.2)
Total Protein: 5.9 g/dL — ABNORMAL LOW (ref 6.0–8.3)

## 2024-07-05 LAB — TSH: TSH: 1.28 u[IU]/mL (ref 0.35–5.50)

## 2024-07-05 LAB — LIPID PANEL
Cholesterol: 111 mg/dL (ref 0–200)
HDL: 36.4 mg/dL — ABNORMAL LOW (ref >39.00)
LDL Cholesterol: 35 mg/dL (ref 0–99)
NonHDL: 74.11
Total CHOL/HDL Ratio: 3
Triglycerides: 198 mg/dL — ABNORMAL HIGH (ref 0.0–149.0)
VLDL: 39.6 mg/dL (ref 0.0–40.0)

## 2024-07-05 LAB — IBC + FERRITIN
Ferritin: 102.8 ng/mL (ref 10.0–291.0)
Iron: 61 ug/dL (ref 42–145)
Saturation Ratios: 18.9 % — ABNORMAL LOW (ref 20.0–50.0)
TIBC: 322 ug/dL (ref 250.0–450.0)
Transferrin: 230 mg/dL (ref 212.0–360.0)

## 2024-07-05 LAB — CBC WITH DIFFERENTIAL/PLATELET
Basophils Absolute: 0 K/uL (ref 0.0–0.1)
Basophils Relative: 0.6 % (ref 0.0–3.0)
Eosinophils Absolute: 0.5 K/uL (ref 0.0–0.7)
Eosinophils Relative: 6 % — ABNORMAL HIGH (ref 0.0–5.0)
HCT: 39.3 % (ref 36.0–46.0)
Hemoglobin: 12.8 g/dL (ref 12.0–15.0)
Lymphocytes Relative: 40.6 % (ref 12.0–46.0)
Lymphs Abs: 3.1 K/uL (ref 0.7–4.0)
MCHC: 32.6 g/dL (ref 30.0–36.0)
MCV: 91 fl (ref 78.0–100.0)
Monocytes Absolute: 0.5 K/uL (ref 0.1–1.0)
Monocytes Relative: 6.5 % (ref 3.0–12.0)
Neutro Abs: 3.5 K/uL (ref 1.4–7.7)
Neutrophils Relative %: 46.3 % (ref 43.0–77.0)
Platelets: 268 K/uL (ref 150.0–400.0)
RBC: 4.32 Mil/uL (ref 3.87–5.11)
RDW: 13.9 % (ref 11.5–15.5)
WBC: 7.6 K/uL (ref 4.0–10.5)

## 2024-07-05 LAB — LDL CHOLESTEROL, DIRECT: Direct LDL: 48 mg/dL

## 2024-07-05 LAB — HEMOGLOBIN A1C: Hgb A1c MFr Bld: 6.5 % (ref 4.6–6.5)

## 2024-07-06 ENCOUNTER — Encounter: Payer: Self-pay | Admitting: Oncology

## 2024-07-08 ENCOUNTER — Ambulatory Visit: Payer: Self-pay | Admitting: Internal Medicine

## 2024-07-09 ENCOUNTER — Ambulatory Visit: Admitting: Internal Medicine

## 2024-07-09 ENCOUNTER — Encounter: Payer: Self-pay | Admitting: Internal Medicine

## 2024-07-09 VITALS — BP 134/68 | HR 97 | Ht 65.0 in | Wt 203.8 lb

## 2024-07-09 DIAGNOSIS — Z23 Encounter for immunization: Secondary | ICD-10-CM | POA: Diagnosis not present

## 2024-07-09 DIAGNOSIS — I10 Essential (primary) hypertension: Secondary | ICD-10-CM | POA: Diagnosis not present

## 2024-07-09 DIAGNOSIS — G8929 Other chronic pain: Secondary | ICD-10-CM

## 2024-07-09 DIAGNOSIS — E1121 Type 2 diabetes mellitus with diabetic nephropathy: Secondary | ICD-10-CM

## 2024-07-09 DIAGNOSIS — M25561 Pain in right knee: Secondary | ICD-10-CM | POA: Diagnosis not present

## 2024-07-09 DIAGNOSIS — R944 Abnormal results of kidney function studies: Secondary | ICD-10-CM

## 2024-07-09 DIAGNOSIS — N1831 Chronic kidney disease, stage 3a: Secondary | ICD-10-CM | POA: Diagnosis not present

## 2024-07-09 DIAGNOSIS — Z7984 Long term (current) use of oral hypoglycemic drugs: Secondary | ICD-10-CM

## 2024-07-09 DIAGNOSIS — N1832 Chronic kidney disease, stage 3b: Secondary | ICD-10-CM | POA: Diagnosis not present

## 2024-07-09 DIAGNOSIS — F320 Major depressive disorder, single episode, mild: Secondary | ICD-10-CM | POA: Diagnosis not present

## 2024-07-09 DIAGNOSIS — Z1231 Encounter for screening mammogram for malignant neoplasm of breast: Secondary | ICD-10-CM

## 2024-07-09 MED ORDER — EMPAGLIFLOZIN 25 MG PO TABS: 25.0000 mg | ORAL_TABLET | Freq: Every day | ORAL | 3 refills | Status: AC

## 2024-07-09 MED ORDER — VALSARTAN 80 MG PO TABS: 80.0000 mg | ORAL_TABLET | Freq: Every day | ORAL | 3 refills | Status: AC

## 2024-07-09 MED ORDER — CARVEDILOL 3.125 MG PO TABS: 3.1250 mg | ORAL_TABLET | Freq: Two times a day (BID) | ORAL | 1 refills | Status: AC

## 2024-07-09 NOTE — Assessment & Plan Note (Signed)
 Using tramadol   alternating ,

## 2024-07-09 NOTE — Assessment & Plan Note (Signed)
 GFR is stable and has ranged from 58 ml/min to 69 ml/min. .  She is avoiding NSAIDS and taking an ARB   Lab Results  Component Value Date   CREATININE 0.73 07/05/2024   Lab Results  Component Value Date   MICROALBUR 4.3 (H) 01/18/2024     GFR is stable and has ranged from 58 ml/min to 69 ml/min. .  She is avoiding NSAIDS and taking an ARB   Lab Results  Component Value Date   CREATININE 0.96 01/18/2024   Lab Results  Component Value Date   MICROALBUR 4.3 (H) 01/18/2024
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
## 2024-07-09 NOTE — Patient Instructions (Addendum)
 1) Your diabetes remains under excellent control  And your cholesterol and other labs are also either at goal or unchanged . Please continue your current medications. return in 6 months for follow up on diabetes and make sure you are seeing your eye doctor at least once a year for a dilated retina exam to monitor for diabetic retinopathy,. changes that can lead to blindness .   For your  knee pain:   You can take 3000 mg of acetominophen (tylenol ) every day safely  In divided doses (750 mg  every 6 hours  Or 1000 mg every 8 hours.)  2) Your blood pressure should be maintained at 120/70  to delay progression of kidney disease.     PLEASE FIND YOUR HOME MACHINE AND SCHEDULE A NURSE VISIT TO HAVE IT CALIBRATED

## 2024-07-10 NOTE — Assessment & Plan Note (Signed)
The patient is being treated for depression and states that all symptoms remain minimally intrusive  with current regimen . The patient  denies suicidality and panic attacks.

## 2024-07-10 NOTE — Assessment & Plan Note (Signed)
 she reports compliance with 4 drug medication regimen  but has an elevated reading today in office.  She is not using NSAIDs daily.  Discussed goal of 120/70  (130/80 for patients over 70)  to preserve renal function.  She has been asked to check her  BP  at home and  submit readings for evaluation. Renal function, electrolytes and screen for proteinuria are all normal .t  Lab Results  Component Value Date   CREATININE 0.73 07/05/2024   Lab Results  Component Value Date   NA 141 07/05/2024   K 3.9 07/05/2024   CL 105 07/05/2024   CO2 26 07/05/2024   Lab Results  Component Value Date   MICROALBUR 4.3 (H) 01/18/2024

## 2024-07-10 NOTE — Assessment & Plan Note (Signed)
 Improved  after suspenion OF  NSAIDs  Lab Results  Component Value Date   CREATININE 0.73 07/05/2024   Lab Results  Component Value Date   NA 141 07/05/2024   K 3.9 07/05/2024   CL 105 07/05/2024   CO2 26 07/05/2024

## 2024-07-12 ENCOUNTER — Other Ambulatory Visit: Payer: Self-pay

## 2024-07-12 DIAGNOSIS — D509 Iron deficiency anemia, unspecified: Secondary | ICD-10-CM

## 2024-07-13 ENCOUNTER — Inpatient Hospital Stay: Attending: Oncology

## 2024-07-13 DIAGNOSIS — D509 Iron deficiency anemia, unspecified: Secondary | ICD-10-CM

## 2024-07-22 ENCOUNTER — Other Ambulatory Visit: Payer: Self-pay | Admitting: Internal Medicine

## 2024-07-23 ENCOUNTER — Other Ambulatory Visit: Payer: Self-pay | Admitting: Internal Medicine

## 2024-08-31 ENCOUNTER — Telehealth: Payer: Self-pay

## 2024-08-31 NOTE — Telephone Encounter (Signed)
 PAP: Patient assistance application for Jardiance through [employer]) has been mailed to pt's home address on file. Provider portion of application will be faxed to provider's office.

## 2024-09-05 NOTE — Telephone Encounter (Signed)
 Received Provider portion PAP application for Jardiance  ([place])

## 2024-09-12 ENCOUNTER — Other Ambulatory Visit: Payer: Self-pay | Admitting: Internal Medicine

## 2024-09-14 NOTE — Telephone Encounter (Signed)
 Reached out to patient regarding PAP application for Jardiance  (BI). Left HIPAA compliant V/M for return call if any assistance needed.

## 2024-09-28 NOTE — Telephone Encounter (Signed)
 PAP: Application for Bernadine has been submitted to [employer]), via fax

## 2024-10-19 ENCOUNTER — Inpatient Hospital Stay

## 2024-10-19 ENCOUNTER — Ambulatory Visit

## 2024-10-19 ENCOUNTER — Other Ambulatory Visit

## 2024-10-19 ENCOUNTER — Ambulatory Visit: Admitting: Nurse Practitioner

## 2024-11-05 ENCOUNTER — Ambulatory Visit: Payer: Medicare PPO

## 2025-01-14 ENCOUNTER — Ambulatory Visit: Admitting: Internal Medicine
